# Patient Record
Sex: Female | Born: 1945
Health system: Southern US, Community
[De-identification: ages and names within clinical notes are randomized; demographics above are authoritative.]

## PROBLEM LIST (undated history)

## (undated) DIAGNOSIS — I10 Essential (primary) hypertension: Secondary | ICD-10-CM

## (undated) DIAGNOSIS — J449 Chronic obstructive pulmonary disease, unspecified: Secondary | ICD-10-CM

## (undated) DIAGNOSIS — I2510 Atherosclerotic heart disease of native coronary artery without angina pectoris: Secondary | ICD-10-CM

## (undated) DIAGNOSIS — N390 Urinary tract infection, site not specified: Secondary | ICD-10-CM

## (undated) DIAGNOSIS — F329 Major depressive disorder, single episode, unspecified: Secondary | ICD-10-CM

## (undated) DIAGNOSIS — E0590 Thyrotoxicosis, unspecified without thyrotoxic crisis or storm: Secondary | ICD-10-CM

## (undated) DIAGNOSIS — E669 Obesity, unspecified: Secondary | ICD-10-CM

## (undated) DIAGNOSIS — E785 Hyperlipidemia, unspecified: Secondary | ICD-10-CM

## (undated) DIAGNOSIS — R0902 Hypoxemia: Secondary | ICD-10-CM

## (undated) DIAGNOSIS — N059 Unspecified nephritic syndrome with unspecified morphologic changes: Secondary | ICD-10-CM

## (undated) DIAGNOSIS — J309 Allergic rhinitis, unspecified: Secondary | ICD-10-CM

## (undated) DIAGNOSIS — J4489 Other specified chronic obstructive pulmonary disease: Secondary | ICD-10-CM

## (undated) DIAGNOSIS — F3289 Other specified depressive episodes: Secondary | ICD-10-CM

## (undated) HISTORY — DX: Other specified depressive episodes: F32.89

## (undated) HISTORY — DX: Other specified chronic obstructive pulmonary disease: J44.89

## (undated) HISTORY — DX: Urinary tract infection, site not specified: N39.0

## (undated) HISTORY — DX: Obesity, unspecified: E66.9

## (undated) HISTORY — DX: Essential (primary) hypertension: I10

## (undated) HISTORY — DX: Chronic obstructive pulmonary disease, unspecified: J44.9

## (undated) HISTORY — DX: Thyrotoxicosis, unspecified without thyrotoxic crisis or storm: E05.90

## (undated) HISTORY — DX: Allergic rhinitis, unspecified: J30.9

## (undated) HISTORY — DX: Atherosclerotic heart disease of native coronary artery without angina pectoris: I25.10

## (undated) HISTORY — DX: Major depressive disorder, single episode, unspecified: F32.9

## (undated) HISTORY — DX: Hypoxemia: R09.02

## (undated) HISTORY — DX: Hyperlipidemia, unspecified: E78.5

## (undated) HISTORY — DX: Unspecified nephritic syndrome with unspecified morphologic changes: N05.9

## (undated) HISTORY — PX: CHOLECYSTECTOMY: SHX55

---

## 1999-04-08 ENCOUNTER — Encounter: Admission: RE | Admit: 1999-04-08 | Discharge: 1999-04-08 | Payer: Self-pay | Admitting: Family Medicine

## 1999-04-08 ENCOUNTER — Encounter: Payer: Self-pay | Admitting: Family Medicine

## 2000-02-01 ENCOUNTER — Encounter: Payer: Self-pay | Admitting: Family Medicine

## 2000-02-01 ENCOUNTER — Encounter: Admission: RE | Admit: 2000-02-01 | Discharge: 2000-02-01 | Payer: Self-pay | Admitting: Family Medicine

## 2002-05-30 ENCOUNTER — Encounter: Admission: RE | Admit: 2002-05-30 | Discharge: 2002-05-30 | Payer: Self-pay | Admitting: Family Medicine

## 2002-05-30 ENCOUNTER — Encounter: Payer: Self-pay | Admitting: Family Medicine

## 2002-09-25 ENCOUNTER — Other Ambulatory Visit: Admission: RE | Admit: 2002-09-25 | Discharge: 2002-09-25 | Payer: Self-pay | Admitting: Family Medicine

## 2003-12-23 ENCOUNTER — Other Ambulatory Visit: Admission: RE | Admit: 2003-12-23 | Discharge: 2003-12-23 | Payer: Self-pay | Admitting: Family Medicine

## 2004-10-06 ENCOUNTER — Encounter: Admission: RE | Admit: 2004-10-06 | Discharge: 2004-10-06 | Payer: Self-pay | Admitting: Family Medicine

## 2005-01-12 ENCOUNTER — Other Ambulatory Visit: Admission: RE | Admit: 2005-01-12 | Discharge: 2005-01-12 | Payer: Self-pay | Admitting: Family Medicine

## 2005-02-20 ENCOUNTER — Encounter: Admission: RE | Admit: 2005-02-20 | Discharge: 2005-02-20 | Payer: Self-pay | Admitting: Orthopedic Surgery

## 2005-02-23 ENCOUNTER — Ambulatory Visit (HOSPITAL_BASED_OUTPATIENT_CLINIC_OR_DEPARTMENT_OTHER): Admission: RE | Admit: 2005-02-23 | Discharge: 2005-02-23 | Payer: Self-pay | Admitting: Orthopedic Surgery

## 2005-02-23 ENCOUNTER — Encounter (INDEPENDENT_AMBULATORY_CARE_PROVIDER_SITE_OTHER): Payer: Self-pay | Admitting: Specialist

## 2005-04-29 ENCOUNTER — Inpatient Hospital Stay (HOSPITAL_COMMUNITY): Admission: EM | Admit: 2005-04-29 | Discharge: 2005-05-04 | Payer: Self-pay | Admitting: Emergency Medicine

## 2005-04-29 ENCOUNTER — Ambulatory Visit: Payer: Self-pay | Admitting: Emergency Medicine

## 2005-04-30 ENCOUNTER — Encounter (INDEPENDENT_AMBULATORY_CARE_PROVIDER_SITE_OTHER): Payer: Self-pay | Admitting: *Deleted

## 2005-05-01 ENCOUNTER — Encounter (INDEPENDENT_AMBULATORY_CARE_PROVIDER_SITE_OTHER): Payer: Self-pay | Admitting: Specialist

## 2005-06-06 ENCOUNTER — Ambulatory Visit: Payer: Self-pay | Admitting: Emergency Medicine

## 2005-06-28 ENCOUNTER — Ambulatory Visit: Payer: Self-pay | Admitting: Emergency Medicine

## 2005-07-04 ENCOUNTER — Ambulatory Visit: Payer: Self-pay | Admitting: Emergency Medicine

## 2005-11-02 ENCOUNTER — Encounter: Admission: RE | Admit: 2005-11-02 | Discharge: 2005-11-02 | Payer: Self-pay | Admitting: Family Medicine

## 2005-12-25 ENCOUNTER — Ambulatory Visit: Payer: Self-pay | Admitting: Emergency Medicine

## 2006-07-06 ENCOUNTER — Ambulatory Visit: Payer: Self-pay | Admitting: Emergency Medicine

## 2006-10-31 DIAGNOSIS — I10 Essential (primary) hypertension: Secondary | ICD-10-CM | POA: Insufficient documentation

## 2006-10-31 DIAGNOSIS — F3289 Other specified depressive episodes: Secondary | ICD-10-CM | POA: Insufficient documentation

## 2006-10-31 DIAGNOSIS — E669 Obesity, unspecified: Secondary | ICD-10-CM | POA: Insufficient documentation

## 2006-10-31 DIAGNOSIS — N059 Unspecified nephritic syndrome with unspecified morphologic changes: Secondary | ICD-10-CM | POA: Insufficient documentation

## 2006-10-31 DIAGNOSIS — J449 Chronic obstructive pulmonary disease, unspecified: Secondary | ICD-10-CM | POA: Insufficient documentation

## 2006-10-31 DIAGNOSIS — E785 Hyperlipidemia, unspecified: Secondary | ICD-10-CM | POA: Insufficient documentation

## 2006-10-31 DIAGNOSIS — F329 Major depressive disorder, single episode, unspecified: Secondary | ICD-10-CM | POA: Insufficient documentation

## 2006-10-31 DIAGNOSIS — J441 Chronic obstructive pulmonary disease with (acute) exacerbation: Secondary | ICD-10-CM | POA: Insufficient documentation

## 2006-10-31 DIAGNOSIS — J309 Allergic rhinitis, unspecified: Secondary | ICD-10-CM | POA: Insufficient documentation

## 2006-10-31 DIAGNOSIS — E0590 Thyrotoxicosis, unspecified without thyrotoxic crisis or storm: Secondary | ICD-10-CM | POA: Insufficient documentation

## 2006-11-08 ENCOUNTER — Ambulatory Visit: Payer: Self-pay | Admitting: Emergency Medicine

## 2007-01-21 ENCOUNTER — Ambulatory Visit: Payer: Self-pay | Admitting: Emergency Medicine

## 2007-01-24 ENCOUNTER — Encounter: Payer: Self-pay | Admitting: Emergency Medicine

## 2007-02-11 ENCOUNTER — Encounter: Admission: RE | Admit: 2007-02-11 | Discharge: 2007-02-11 | Payer: Self-pay | Admitting: Family Medicine

## 2007-02-12 ENCOUNTER — Ambulatory Visit: Payer: Self-pay | Admitting: Emergency Medicine

## 2007-02-12 ENCOUNTER — Telehealth (INDEPENDENT_AMBULATORY_CARE_PROVIDER_SITE_OTHER): Payer: Self-pay | Admitting: *Deleted

## 2007-02-12 DIAGNOSIS — J209 Acute bronchitis, unspecified: Secondary | ICD-10-CM | POA: Insufficient documentation

## 2007-04-29 ENCOUNTER — Ambulatory Visit: Payer: Self-pay | Admitting: Emergency Medicine

## 2007-06-28 ENCOUNTER — Encounter: Payer: Self-pay | Admitting: Emergency Medicine

## 2007-10-14 ENCOUNTER — Ambulatory Visit: Payer: Self-pay | Admitting: Emergency Medicine

## 2007-12-13 ENCOUNTER — Ambulatory Visit: Payer: Self-pay | Admitting: Emergency Medicine

## 2007-12-13 DIAGNOSIS — J069 Acute upper respiratory infection, unspecified: Secondary | ICD-10-CM | POA: Insufficient documentation

## 2008-03-11 ENCOUNTER — Encounter: Payer: Self-pay | Admitting: Emergency Medicine

## 2008-03-13 ENCOUNTER — Ambulatory Visit: Payer: Self-pay | Admitting: Emergency Medicine

## 2008-04-08 ENCOUNTER — Encounter: Admission: RE | Admit: 2008-04-08 | Discharge: 2008-04-08 | Payer: Self-pay | Admitting: Family Medicine

## 2008-04-23 ENCOUNTER — Encounter: Payer: Self-pay | Admitting: Emergency Medicine

## 2008-06-26 ENCOUNTER — Other Ambulatory Visit: Admission: RE | Admit: 2008-06-26 | Discharge: 2008-06-26 | Payer: Self-pay | Admitting: Family Medicine

## 2008-12-01 ENCOUNTER — Ambulatory Visit: Payer: Self-pay | Admitting: Emergency Medicine

## 2008-12-04 ENCOUNTER — Encounter: Payer: Self-pay | Admitting: Emergency Medicine

## 2009-04-08 ENCOUNTER — Encounter: Payer: Self-pay | Admitting: Emergency Medicine

## 2009-04-26 ENCOUNTER — Encounter: Admission: RE | Admit: 2009-04-26 | Discharge: 2009-04-26 | Payer: Self-pay | Admitting: Family Medicine

## 2009-06-17 ENCOUNTER — Ambulatory Visit: Payer: Self-pay | Admitting: Emergency Medicine

## 2009-11-23 ENCOUNTER — Telehealth (INDEPENDENT_AMBULATORY_CARE_PROVIDER_SITE_OTHER): Payer: Self-pay | Admitting: *Deleted

## 2009-11-23 ENCOUNTER — Ambulatory Visit: Payer: Self-pay | Admitting: Emergency Medicine

## 2010-01-05 ENCOUNTER — Ambulatory Visit: Payer: Self-pay | Admitting: Emergency Medicine

## 2010-02-10 NOTE — Miscellaneous (Signed)
Summary: Orders Update   Clinical Lists Changes  Orders: Added new Service order of Est. Patient Level IV (99214) - Signed 

## 2010-02-10 NOTE — Assessment & Plan Note (Signed)
Summary: cough, allergies, COPD   Visit Type:  Follow-up Primary Provider/Referring Provider:  Clyde Canterbury  CC:  COPD...increased SOB w/ exert...dry cough...RUQ pain with cough...feels "light-headed" w/ cough.  History of Present Illness: [name] [name]  is a 65 year old with moderate COPD.    ROV 12/01/08 -- No exacerbation since last visit. Has been maintained on Spiriva once daily. Has ventolin available to use as needed, not using it every day. Exertional tolerance is about the same. Uses O2 with some exertion but not all.  Wants to know if she can stop using O2 at night. Has had her flu shot.   Acute visit 06/17/09 -- Moderate COPD, presents with about a week of worsening PND, dry cough, chest tightness. Has been using ventolin more often. No sick contacts, no f/c/sweats. Some HA. Has heard wheezing. Not on fexofenadine right now.   ROV 11/23/09 -- COPD. Treated her for a possible flare of her allergies last time with Pred taper. Was better for a while. Now experiencing more dry cough for the last week, rarely productive, doesn't really notice any more nasal drainage. Has been on fexofanadine since last time.     Preventive Screening-Counseling & Management  Alcohol-Tobacco     Smoking Status: never  Current Medications (verified): 1)  Levothyroxine Sodium 100 Mcg Tabs (Levothyroxine Sodium) .... Take 1 Tablet By Mouth Once A Day 2)  Citalopram Hydrobromide 40 Mg Tabs (Citalopram Hydrobromide) .... Once Daily 3)  Lipitor 20 Mg  Tabs (Atorvastatin Calcium) .... Take 1 Tablet By Mouth Once A Day 4)  Atenolol 50 Mg  Tabs (Atenolol) .... Take 1 Tablet By Mouth Once A Day 5)  Hydrochlorothiazide 25 Mg  Tabs (Hydrochlorothiazide) .... Take 1 Tablet By Mouth Once A Day 6)  Spiriva Handihaler 18 Mcg  Caps (Tiotropium Bromide Monohydrate) .... Inhale Contents of 1 Capsule Once A Day 7)  Ventolin Hfa 108 (90 Base) Mcg/act Aers (Albuterol Sulfate) .... Inhale 2 Puffs Every 4 Hours As Needed 8)   Fexofenadine Hcl 60 Mg  Tabs (Fexofenadine Hcl) .... Take 1 Tablet By Mouth Two Times A Day As Needed 9)  Bayer Childrens Aspirin 81 Mg Chew (Aspirin) .... Once Daily 10)  Fish Oil 1000 Mg Caps (Omega-3 Fatty Acids) .... Take 1 Capsule By Mouth Once A Day 11)  Vitamin C 1000 Mg Tabs (Ascorbic Acid) .... Take 1 Tablet By Mouth Once A Day 12)  Caltrate 600+d 600-400 Mg-Unit Tabs (Calcium Carbonate-Vitamin D) .... Take 2 Tablet By Mouth Once A Day 13)  Oxygen .... 2l With Exertion  Allergies (verified): 1)  ! Sulfa  Vital Signs:  Patient profile:   65 year old female Height:      61 inches (154.94 cm) Weight:      170 pounds (77.27 kg) BMI:     32.24 O2 Sat:      93 % on Room air Temp:     98.1 degrees F (36.72 degrees C) oral Pulse rate:   101 / minute BP sitting:   122 / 72  (left arm) Cuff size:   regular  Vitals Entered By: Michel Bickers CMA (November 23, 2009 11:56 AM)  O2 Sat at Rest %:  93 O2 Flow:  Room air CC: COPD...increased SOB w/ exert...dry cough...RUQ pain with cough...feels "light-headed" w/ cough Comments Medications reviewed with patient Michel Bickers CMA  November 23, 2009 11:57 AM   Physical Exam  General:  normal appearance, healthy appearing, and obese.   Head:  normocephalic and atraumatic  Nose:  no deformity, discharge, inflammation, or lesions Mouth:  no deformity or lesions Neck:  no masses, thyromegaly, or abnormal cervical nodes, stridor on forced exp Chest Wall:  no deformities noted Lungs:  distant with good air movement and no wheezing or crackles Heart:  regular rate and rhythm, S1, S2 without murmurs, rubs, gallops, or clicks Abdomen:  not examined Msk:  no deformity or scoliosis noted with normal posture Extremities:  trace pretibial edema Neurologic:  non-focal Skin:  intact without lesions or rashes Psych:  alert and cooperative; normal mood and affect; normal attention span and concentration   Impression & Recommendations:  Problem # 1:   ALLERGIC RHINITIS (ICD-477.9) With cough, worsening.   Her updated medication list for this problem includes:    Fexofenadine Hcl 60 Mg Tabs (Fexofenadine hcl) .Marland Kitchen... Take 1 tablet by mouth two times a day as needed  Orders: Est. Patient Level IV (04540)  Problem # 2:  CHRONIC OBSTRUCTIVE PULMONARY DISEASE, MODERATE (ICD-496)  Medications Added to Medication List This Visit: 1)  Oxygen  .... 2l with exertion  Patient Instructions: 1)  We will temporarily stop your Spiriva for the next 2 weeks, then restart it.  2)  Start fluticasone spray, 2 sprays each side two times a day  3)  Continue your fexofenadine once daily  4)  If your cough continues please start doing nasal saline washes once daily  5)  Follow with Dr Delton Coombes in 1 month to assess your progress    Immunization History:  Influenza Immunization History:    Influenza:  historical (10/09/2009)

## 2010-02-10 NOTE — Procedures (Signed)
Summary: Colonoscopy Report/[facility]  Colonoscopy Report/[facility]   Imported By: Stephannie Li 08/01/2007 13:11:44  _____________________________________________________________________  External Attachment:    Type:   Image     Comment:   External Document

## 2010-02-10 NOTE — Assessment & Plan Note (Signed)
Summary: URI, COPD   Visit Type:  Follow-up PCP:  Clyde Canterbury  Chief Complaint:  Acute visit.  Pt c/o dry cough and runny nose x 2 days.  She also c/o pain in both sides she relates to coughing so much.  She states that her breathing is about the same but has noticed some wheezing.  Marland Kitchen  History of Present Illness: Ms. Slatten  is a 65 year old with moderate COPD.  Continues to take Spiriva ands is doing well. Has lost some wt since last visit 12 lbs.   Began to have clear PND 3/4, scratchy throat. No sore throat. Subjective fever. Positive sick contact at work. No GI symptoms. Has used SABA x 1, it did help. Starting to have dry cough.       Current Allergies: ! SULFA  Risk Factors:  Tobacco use:  never  Vital Signs:  Patient Profile:   65 Years Old Female Height:     61 inches Weight:      159.50 pounds O2 Sat:      96 % O2 treatment:    Room Air Temp:     97.8 degrees F oral Pulse rate:   78 / minute BP sitting:   122 / 68  (left arm)  Vitals Entered By: Vernie Murders (March 13, 2008 9:26 AM)                Physical Exam  General:     normal appearance, healthy appearing, and obese.   Head:     normocephalic and atraumatic Nose:     congested Mouth:     no deformity or lesions, some mild posterior pharyngeal erythema and drainage Neck:     no masses, thyromegaly, or abnormal cervical nodes Chest Wall:     no deformities noted Lungs:     distant with good air movement and no wheezing or crackles Heart:     regular rate and rhythm, S1, S2 without murmurs, rubs, gallops, or clicks Abdomen:     not examined Msk:     no deformity or scoliosis noted with normal posture Extremities:     trace pretibial edema Neurologic:     non-focal Skin:     intact without lesions or rashes Psych:     alert and cooperative; normal mood and affect; normal attention span and concentration   Impression & Recommendations:  Problem # 1:  VIRAL URI  (ICD-465.9) Symptomatic relief. Reviewed the signs and symptoms of COPD exac with her, instructed her to call if these evolve so we can treat her early. Reviewed good hand-washing technique.  Orders: Est. Patient Level III (81191)   Problem # 2:  CHRONIC OBSTRUCTIVE PULMONARY DISEASE, MODERATE (ICD-496) continue Spiriva as ordered f/u in 3 mo or as needed  The following medications were removed from the medication list:    Azithromycin 250 Mg Tabs (Azithromycin) .Marland Kitchen... Take two by mouth on the foirst day, then 1 by mouth once daily until gone.  Her updated medication list for this problem includes:    Spiriva Handihaler 18 Mcg Caps (Tiotropium bromide monohydrate) ..... Inhale contents of 1 capsule once a day    Ventolin Hfa 108 (90 Base) Mcg/act Aers (Albuterol sulfate) ..... Inhale 2 puffs every 4 hours as needed  Orders: Est. Patient Level III (47829)   Patient Instructions: 1)  Use delsym every 12 hours for cough. 2)  Try decongestants that contain either bromphenerimine or chlorphenerimine for your nasal congestion 3)  Call our  office if you develop colored sputum, fever, shortness of breath. We will treat you for an exacerbation of your COPD if these symptoms occur. 4)  Follow up with Dr Delton Coombes in 3 months or as needed

## 2010-02-10 NOTE — Letter (Signed)
Summary: CMN for Oxygen/[facility]  CMN for Oxygen/[facility]   Imported By: Sherian Rein 04/15/2009 13:07:48  _____________________________________________________________________  External Attachment:    Type:   Image     Comment:   External Document

## 2010-02-10 NOTE — Assessment & Plan Note (Signed)
Summary: COPD   Visit Type:  Follow-up PCP:  Clyde Canterbury  Chief Complaint:  fu visit........cough........SOB with exertion..........Marland Kitchenreviewed meds.  History of Present Illness: Ms. Rockford  is a 65 year old with moderate COPD.  She returns today for regularly scheduled follow-up visit.    She tells me that in general she been doing well.  Returns now feeling fairly well.  Using Spiriva daily. Still using Ventolin about 2 -3 times a week. Able to exert, getting around.  No wheeze currently, but does sometimes hear wheeze with exertion. Occas cough, non-prod.      Current Allergies: ! SULFA      Vital Signs:  Patient Profile:   65 Years Old Female Height:     61 inches Weight:      176 pounds BMI:     33.38 O2 Sat:      92 % O2 treatment:    Room Air Temp:     97.8 degrees F oral Pulse rate:   83 / minute BP sitting:   110 / 72  (left arm) Cuff size:   regular  Vitals Entered By: Clarise Cruz Duncan Dull) (October 14, 2007 4:47 PM)                 Physical Exam  General:     normal appearance, healthy appearing, and obese.   Head:     normocephalic and atraumatic Nose:     no deformity, discharge, inflammation, or lesions Mouth:     no deformity or lesions Neck:     no masses, thyromegaly, or abnormal cervical nodes Chest Wall:     no deformities noted Lungs:     distant with good air movement and no wheezing, she does have some mild basilar inspiratory crackles. positive wheeze on forced expiration. Heart:     regular rate and rhythm, S1, S2 without murmurs, rubs, gallops, or clicks Abdomen:     not examined Msk:     no deformity or scoliosis noted with normal posture Extremities:     trace pretibial edema Psych:     alert and cooperative; normal mood and affect; normal attention span and concentration    Pulmonary Function Test Height (in.): 61 Gender: Female    Impression & Recommendations:  Problem # 1:  CHRONIC OBSTRUCTIVE PULMONARY  DISEASE, MODERATE (ICD-496) Stable - same regimen seasonal flu shot f/u 6 mo  Medications Added to Medication List This Visit: 1)  Levothyroxine Sodium 175 Mcg Tabs (Levothyroxine sodium) .... Every other day once a day 2)  Citalopram Hydrobromide 40 Mg Tabs (Citalopram hydrobromide) .... Once daily 3)  Actonel 35 Mg Tabs (Risedronate sodium) .... Take 1 tablet once a week 4)  Synthroid 150 Mcg Tabs (Levothyroxine sodium) .... Every other day once a day 5)  Bayer Childrens Aspirin 81 Mg Chew (Aspirin) .... Once daily   Patient Instructions: 1)  Continue Spiriva once daily  2)  ventolin as needed 3)  Flu shot tomorrow 4)  Follow up with Dr Delton Coombes in 6 months or as needed.    ]

## 2010-02-10 NOTE — Progress Notes (Signed)
Summary: rx needed > fluticasone to [facility] [employer] Note Call from Patient Call back at Work Phone [phone]   Caller: Patient Call For: byrum Summary of Call: needs an rx for nasonex [facility] to [facility] [place] [phone] Initial call taken by: Lacinda Axon,  November 23, 2009 2:13 PM  Follow-up for Phone Call        nasonex not on med list- just seen this [id number]  1 Vernie Murders  November 23, 2009 2:55 PM  pt returned call.  she states that she was given a sample of nasonex this morning at the [facility] but the rx was not sent.  per pt instructions, pt is to start fluticasone.  rx sent to her verified pharmacy. Boone Master CNA/MA  November 23, 2009 3:47 PM     New/Updated Medications: FLUTICASONE PROPIONATE 50 MCG/ACT SUSP (FLUTICASONE PROPIONATE) 2 sprays each nostril two times a day Prescriptions: FLUTICASONE PROPIONATE 50 MCG/ACT SUSP (FLUTICASONE PROPIONATE) 2 sprays each nostril two times a day  #1 x 6   Entered by:   Boone Master CNA/MA   Authorized by:   Leslye Peer MD   Signed by:   Boone Master CNA/MA on 11/23/2009   Method used:   Electronically to        [facility]  [address] (retail)       [address]       [place], [place]  [address]       Ph: [phone]       Fax: [phone]   RxID:   [id number]

## 2010-02-10 NOTE — Assessment & Plan Note (Signed)
Summary: COPD  Medications Added SYNTHROID 150 MCG  TABS (LEVOTHYROXINE SODIUM) Take 1 tablet by mouth once a day FEXOFENADINE HCL 60 MG  TABS (FEXOFENADINE HCL) Take 1 tablet by mouth two times a day as needed * VITAMIN D Take 1 tablet once a month      Allergies Added: NKDA  Chief Complaint:  COPD.  History of Present Illness: [name] is a 65 year old with moderate COPD.  She returns today for regular scheduled follow-up visit.  She tells me that in general she been doing well although she's noticed a worsening of her wheezing in the cold weather.  It's also been more difficult for her to exert herself this winter.  She still able to do her usual activities.  She is not had any exacerbations since her last visit in October 2008. She is using Proventil on average one time a day.  Her flu shot is up to date.  She has been wearing her oxygen reliably with exertion.  She is not having significant cough.    Current Allergies: No known allergies      Review of Systems      See HPI   Vital Signs:  Patient Profile:   65 Years Old Female Weight:      173.38 pounds O2 Sat:      95 % O2 treatment:    Room Air Temp:     98.0 degrees F oral Pulse rate:   68 / minute BP sitting:   124 / 82  (left arm)  Vitals Entered By: Cloyde Reams RN (January 21, 2007 4:56 PM)             Comments Pt here for follow-up visit.  Increased SOB assoc with cold weather. Coughing some-non-productive.  C/O headache comes and goes. Medications reviewed      Physical Exam  General:     normal appearance, healthy appearing, and obese.   Head:     normocephalic and atraumatic Nose:     no deformity, discharge, inflammation, or lesions Mouth:     no deformity or lesions Neck:     no masses, thyromegaly, or abnormal cervical nodes Chest Wall:     no deformities noted Lungs:     distant with good air movement and no wheezing, she does have some mild basilar inspiratory  crackles Heart:     regular rate and rhythm, S1, S2 without murmurs, rubs, gallops, or clicks Abdomen:     obese soft benign Msk:     no deformity or scoliosis noted with normal posture Extremities:     trace pretibial edema Psych:     alert and cooperative; normal mood and affect; normal attention span and concentration     Impression & Recommendations:  Problem # 1:  CHRONIC OBSTRUCTIVE PULMONARY DISEASE, MODERATE (ICD-496) She will continue her Spiriva once daily and Proventil on an as needed basis.  If her Proventil use increases it may be necessary to start her on a long-acting beta agonist.  She will continue her oxygen as ordered. She will follow up with me in 3 to 4 months or sooner if she has any difficulty.  Problem # 2:  ALLERGIC RHINITIS (ICD-477.9) she will continue her same regimen Her updated medication list for this problem includes:    Fexofenadine Hcl 60 Mg Tabs (Fexofenadine hcl) .Marland Kitchen... Take 1 tablet by mouth two times a day as needed   Medications Added to Medication List This Visit: 1)  Synthroid  150 Mcg Tabs (Levothyroxine sodium) .... Take 1 tablet by mouth once a day 2)  Fexofenadine Hcl 60 Mg Tabs (Fexofenadine hcl) .... Take 1 tablet by mouth two times a day as needed 3)  Vitamin D 50mu  .... Take 1 tablet once a month   Patient Instructions: 1)  Continue spiriva once daily. 2)  Continue Proventil if needed for shortness of breath. 3)  Continue oxygen with exertion. 4)  Follow-up 3 - 4 months with Dr Delton Coombes.    ]

## 2010-02-10 NOTE — Progress Notes (Signed)
Summary: Phone: cough, need anitbiotics   Phone Note Call from Patient Call back at Work Phone [phone]   Caller: Patient Call For: nurse/doctor Summary of Call: requesting antibiotics for cough, also cough syrup.  [facility] [place] [phone], work [phone] Initial call taken by: Tivis Ringer February 12, 2007 10:53  Follow-up for Phone Call        work number is wrong - NA at home - no longer listed @ work number.  [name] [name] February 12, 2007 11:30  Additional Follow-up for Phone Call Additional follow up Details #1::        Called pt at work, pt stated that she has had a dry cough for 2 days.  +wheezing and chest congestion, +head congestion with a headache.  pt denies fever, chills and sweats.  informed pt will check with physician and call her back.  ..................................................................Marland KitchenBoone Master CNA  February 12, 2007 2:11 PM     Additional Follow-up for Phone Call Additional follow up Details #2::    LMOVM TCB .................................................................Marland KitchenMarland KitchenVernie [name]  February 12, 2007 2:00 PM   pt made appt for today with tammy parret at 4:30 [name] Diver LPN,  February 12, 2007 2:08 PM

## 2010-02-10 NOTE — Assessment & Plan Note (Signed)
Summary: allergies, COPD   Visit Type:  Follow-up Primary Provider/Referring Provider:  Clyde Canterbury  CC:  Acute Visit.  c/o increased SOB at rest and with activity, wheezing, and dry "hacky" cough x 1wk.  Denies chest tightness.  Marland Kitchen  History of Present Illness: Ms. Coughlin  is a 65 year old with moderate COPD.    ROV 12/01/08 -- No exacerbation since last visit. Has been maintained on Spiriva once daily. Has ventolin available to use as needed, not using it every day. Exertional tolerance is about the same. Uses O2 with some exertion but not all.  Wants to know if she can stop using O2 at night. Has had her flu shot.   Acute visit 06/17/09 -- Moderate COPD, presents with about a week of worsening PND, dry cough, chest tightness. Has been using ventolin more often. No sick contacts, no f/c/sweats. Some HA. Has heard wheezing. Not on fexofenadine right now.     Current Medications (verified): 1)  Levothyroxine Sodium 100 Mcg Tabs (Levothyroxine Sodium) .... Take 1 Tablet By Mouth Once A Day 2)  Citalopram Hydrobromide 40 Mg Tabs (Citalopram Hydrobromide) .... Once Daily 3)  Lipitor 20 Mg  Tabs (Atorvastatin Calcium) .... Take 1 Tablet By Mouth Once A Day 4)  Atenolol 50 Mg  Tabs (Atenolol) .... Take 1 Tablet By Mouth Once A Day 5)  Hydrochlorothiazide 25 Mg  Tabs (Hydrochlorothiazide) .... Take 1 Tablet By Mouth Once A Day 6)  Spiriva Handihaler 18 Mcg  Caps (Tiotropium Bromide Monohydrate) .... Inhale Contents of 1 Capsule Once A Day 7)  Ventolin Hfa 108 (90 Base) Mcg/act Aers (Albuterol Sulfate) .... Inhale 2 Puffs Every 4 Hours As Needed 8)  Fexofenadine Hcl 60 Mg  Tabs (Fexofenadine Hcl) .... Take 1 Tablet By Mouth Two Times A Day As Needed 9)  Bayer Childrens Aspirin 81 Mg Chew (Aspirin) .... Once Daily 10)  Fish Oil 1000 Mg Caps (Omega-3 Fatty Acids) .... Take 1 Capsule By Mouth Once A Day 11)  Vitamin C 1000 Mg Tabs (Ascorbic Acid) .... Take 1 Tablet By Mouth Once A Day 12)  Caltrate  600+d 600-400 Mg-Unit Tabs (Calcium Carbonate-Vitamin D) .... Take 2 Tablet By Mouth Once A Day  Allergies (verified): 1)  ! Sulfa  Vital Signs:  Patient profile:   65 year old female Height:      61 inches Weight:      171 pounds BMI:     32.43 O2 Sat:      96 % on Room air Temp:     97.9 degrees F oral Pulse rate:   88 / minute BP sitting:   110 / 70  (left arm) Cuff size:   regular  Vitals Entered By: Gweneth Dimitri RN (June 17, 2009 10:13 AM)  O2 Flow:  Room air CC: Acute Visit.  c/o increased SOB at rest and with activity, wheezing, dry "hacky" cough x 1wk.  Denies chest tightness.   Comments Medications reviewed with patient Daytime contact number verified with patient. Gweneth Dimitri RN  June 17, 2009 10:14 AM    Physical Exam  General:  normal appearance, healthy appearing, and obese.   Head:  normocephalic and atraumatic Nose:  no deformity, discharge, inflammation, or lesions Mouth:  no deformity or lesions Neck:  no masses, thyromegaly, or abnormal cervical nodes, mild stridor on forced exp Chest Wall:  no deformities noted Lungs:  distant with good air movement and no wheezing or crackles Heart:  regular rate and rhythm,  S1, S2 without murmurs, rubs, gallops, or clicks Abdomen:  not examined Msk:  no deformity or scoliosis noted with normal posture Extremities:  trace pretibial edema Neurologic:  non-focal Skin:  intact without lesions or rashes Psych:  alert and cooperative; normal mood and affect; normal attention span and concentration   Impression & Recommendations:  Problem # 1:  ALLERGIC RHINITIS (ICD-477.9)  Acute flare with UA irritation and stridor.  - restart fexofenadine - brief pred taper - if no better then start nasal steroid, nasal saline washes  Orders: Est. Patient Level IV (04540)  Problem # 2:  CHRONIC OBSTRUCTIVE PULMONARY DISEASE, MODERATE (ICD-496) Does not appear to have acute exacerbation on exam or by hx.  - continue  spiriva - no abx at this time  Medications Added to Medication List This Visit: 1)  Levothyroxine Sodium 100 Mcg Tabs (Levothyroxine sodium) .... Take 1 tablet by mouth once a day 2)  Fish Oil 1000 Mg Caps (Omega-3 fatty acids) .... Take 1 capsule by mouth once a day 3)  Vitamin C 1000 Mg Tabs (Ascorbic acid) .... Take 1 tablet by mouth once a day 4)  Caltrate 600+d 600-400 Mg-unit Tabs (Calcium carbonate-vitamin d) .... Take 2 tablet by mouth once a day 5)  Prednisone 10 Mg Tabs (Prednisone) .... 3 by mouth once daily x 2 days, then 2 by mouth once daily x 2 days then 1po once daily x 2 days then stop  Patient Instructions: 1)  Take prednisone taper as directed 2)  Restart fexofenadine once daily  3)  Call our office next week if you are not feeling better 4)  Otherwise follow up with Dr Delton Coombes at your usual 6 month appointment. Prescriptions: PREDNISONE 10 MG TABS (PREDNISONE) 3 by mouth once daily x 2 days, then 2 by mouth once daily x 2 days then 1po once daily x 2 days then stop  #12 x 0   Entered and Authorized by:   Leslye Peer MD   Signed by:   Leslye Peer MD on 06/17/2009   Method used:   Electronically to        [facility]  [address] (retail)       [address]       [place], [place]  [address]       Ph: [phone]       Fax: [phone]   RxID:   [id number]    Immunization History:  Influenza Immunization History:    Influenza:  historical (12/09/2008)

## 2010-02-10 NOTE — Letter (Signed)
Summary: CMN for Oxygen/[facility]  CMN for Oxygen/[facility]   Imported By: Lanelle Bal 03/17/2008 08:44:51  _____________________________________________________________________  External Attachment:    Type:   Image     Comment:   External Document

## 2010-02-10 NOTE — Assessment & Plan Note (Signed)
Summary: URI, COPD   Visit Type:  Follow-up PCP:  Clyde Canterbury  Chief Complaint:  Cough, congestion, and dyspnea.  History of Present Illness: [name]  is a 65 year old with moderate COPD.  Beginning yesterday she began to experience scratchy throat, dry mouth, watery PND and cough turning yellowish. No fever, chills.   Using Spiriva daily, but hasn't taken for a few days because she ran out. Uses Ventolin few times a week. Some increased wheeze with this illness.      Prior Medications Reviewed Using: List Brought by Patient  Updated Prior Medication List: LEVOTHYROXINE SODIUM 175 MCG TABS (LEVOTHYROXINE SODIUM) every other day once a day CITALOPRAM HYDROBROMIDE 40 MG TABS (CITALOPRAM HYDROBROMIDE) once daily LIPITOR 20 MG  TABS (ATORVASTATIN CALCIUM) Take 1 tablet by mouth once a day ATENOLOL 50 MG  TABS (ATENOLOL) Take 1 tablet by mouth once a day HYDROCHLOROTHIAZIDE 25 MG  TABS (HYDROCHLOROTHIAZIDE) Take 1 tablet by mouth once a day SPIRIVA HANDIHALER 18 MCG  CAPS (TIOTROPIUM BROMIDE MONOHYDRATE) Inhale contents of 1 capsule once a day FOSAMAX 70 MG TABS (ALENDRONATE SODIUM) Once weekly VENTOLIN HFA 108 (90 BASE) MCG/ACT AERS (ALBUTEROL SULFATE) Inhale 2 puffs every 4 hours as needed FEXOFENADINE HCL 60 MG  TABS (FEXOFENADINE HCL) Take 1 tablet by mouth two times a day as needed * VITAMIN D Take 1 tablet once a month SYNTHROID 150 MCG TABS (LEVOTHYROXINE SODIUM) every other day once a day [employer] ASPIRIN 81 MG CHEW (ASPIRIN) once daily  Current Allergies (reviewed today): ! SULFA      Vital Signs:  Patient Profile:   65 Years Old Female Height:     61 inches Weight:      168 pounds O2 Sat:      95 % O2 treatment:    Room Air Temp:     98.6 degrees F oral Pulse rate:   76 / minute BP sitting:   112 / 68  (left arm) Cuff size:   regular  Vitals Entered By: Cloyde Reams RN (December 13, 2007 8:57 AM)             Comments Pt is here today for an  acute OV for congestion, dyspnea, cough.  Onset this am.  Scratchy throat yesterday.  Productive cough-yellow phlegm. Medications reviewed Cloyde Reams RN  December 13, 2007 9:12 AM      Physical Exam  General:     normal appearance, healthy appearing, and obese.   Head:     normocephalic and atraumatic Nose:     no deformity, discharge, inflammation, or lesions Mouth:     no deformity or lesions Neck:     no masses, thyromegaly, or abnormal cervical nodes Chest Wall:     no deformities noted Lungs:     distant with good air movement and no wheezing, she does have some mild basilar inspiratory crackles. positive wheeze on forced expiration. Heart:     regular rate and rhythm, S1, S2 without murmurs, rubs, gallops, or clicks Abdomen:     not examined Msk:     no deformity or scoliosis noted with normal posture Extremities:     trace pretibial edema Psych:     alert and cooperative; normal mood and affect; normal attention span and concentration      Impression & Recommendations:  Problem # 1:  VIRAL URI (ICD-465.9) symptomatic relief  script given for azithro in case she develops symptoms of bronchitis.   Problem # 2:  CHRONIC OBSTRUCTIVE PULMONARY DISEASE, MODERATE (ICD-496) spiriva Her updated medication list for this problem includes:    Spiriva Handihaler 18 Mcg Caps (Tiotropium bromide monohydrate) ..... Inhale contents of 1 capsule once a day    Ventolin Hfa 108 (90 Base) Mcg/act Aers (Albuterol sulfate) ..... Inhale 2 puffs every 4 hours as needed    Azithromycin 250 Mg Tabs (Azithromycin) .Marland Kitchen... Take two by mouth on the foirst day, then 1 by mouth once daily until gone.  Orders: Est. Patient Level III (16109)   Medications Added to Medication List This Visit: 1)  Fosamax 70 Mg Tabs (Alendronate sodium) .... Once weekly 2)  Ventolin Hfa 108 (90 Base) Mcg/act Aers (Albuterol sulfate) .... Inhale 2 puffs every 4 hours as needed 3)  Azithromycin 250 Mg Tabs  (Azithromycin) .... Take two by mouth on the foirst day, then 1 by mouth once daily until gone.   Patient Instructions: 1)  Continue your Spiriva once daily  2)  Use over-the-counter medications for symptomatica relief. Try medications that contain chlorphenerimine or bromphenerimine and use as directed. 3)  Fill your prescription for azithromycin if you develop fever, increasing sputum or a change in color of your sputum.  4)  Wear a mask when visiting your husband in the hospital until your upper respiratory infection resolves. 5)  Follow up with Dr Delton Coombes as previously scheduled.   Prescriptions: AZITHROMYCIN 250 MG TABS (AZITHROMYCIN) take two by mouth on the foirst day, then 1 by mouth once daily until gone.  #6 x 0   Entered and Authorized by:   Leslye Peer MD   Signed by:   Leslye Peer MD on 12/13/2007   Method used:   Print then Give to Patient   RxID:   [id number]  ]

## 2010-02-10 NOTE — Miscellaneous (Signed)
Summary: Letter of Medical Necessity for oxygen/[facility]  Letter of Medical Necessity for oxygen/[facility]   Imported By: Sherian Rein 05/01/2008 13:11:19  _____________________________________________________________________  External Attachment:    Type:   Image     Comment:   External Document

## 2010-02-10 NOTE — Assessment & Plan Note (Signed)
Summary: COPD, allergies   Visit Type:  Follow-up PCP:  Clyde Canterbury  Chief Complaint:  COPD.  History of Present Illness: [name] is a 65 year old with moderate COPD.  She returns today for regular scheduled follow-up visit.  She tells me that in general she been doing well. Had an exacerbation in 2/09, better after Pred and doxy. Returns now feeling fairly well.  Using Spiriva daily. Uses Ventolin about 2 -3 times a week. Able to exert, getting around.  No wheeze currently. Occas cough, non-prod.      Current Allergies: ! SULFA     Review of Systems      See HPI   Vital Signs:  Patient Profile:   65 Years Old Female Weight:      177.13 pounds O2 Sat:      93 % O2 treatment:    Room Air Temp:     98.4 degrees F oral Pulse rate:   79 / minute BP sitting:   110 / 72  (right arm)  Vitals Entered By: Cloyde Reams RN (April 29, 2007 4:17 PM)             Comments Pt is here today for a follow-up visit.  Pt states breathing OK.  Has occassional episodes of SOB since the long periods of rain.  Medications reviewed ..................................................................Marland KitchenCloyde Reams RN  April 29, 2007 4:27 PM      Physical Exam  General:     normal appearance, healthy appearing, and obese.   Head:     normocephalic and atraumatic Nose:     no deformity, discharge, inflammation, or lesions Mouth:     no deformity or lesions Neck:     no masses, thyromegaly, or abnormal cervical nodes Chest Wall:     no deformities noted Lungs:     distant with good air movement and no wheezing, she does have some mild basilar inspiratory crackles. positive wheeze on forced expiration. Heart:     regular rate and rhythm, S1, S2 without murmurs, rubs, gallops, or clicks Abdomen:     obese soft benign Msk:     no deformity or scoliosis noted with normal posture Extremities:     trace pretibial edema Psych:     alert and cooperative; normal mood and affect; normal  attention span and concentration     Impression & Recommendations:  Problem # 1:  CHRONIC OBSTRUCTIVE PULMONARY DISEASE, MODERATE (ICD-496) Continue Spiriva daily, as needed Ventolin.  RTC in 6 mo or as needed.  The following medications were removed from the medication list:    Doxycycline Hyclate 100 Mg Caps (Doxycycline hyclate) .Marland Kitchen... 1 by mouth two times a day    Prednisone 10 Mg Tabs (Prednisone) .Marland KitchenMarland KitchenMarland KitchenMarland Kitchen 4 tabs for 2 days, then 3 tabs for 2 days, 2 tabs for 2 days, then 1 tab for 2 days, then stop  Her updated medication list for this problem includes:    Spiriva Handihaler 18 Mcg Caps (Tiotropium bromide monohydrate) ..... Inhale contents of 1 capsule once a day    Proventil Hfa 108 (90 Base) Mcg/act Aers (Albuterol sulfate) ..... Inhale 2 puffs every 4 hrs as needed  Orders: Est. Patient Level IV (16109)   Problem # 2:  ALLERGIC RHINITIS (ICD-477.9) She will start using her fexofenadine on an everyday basis.  Her updated medication list for this problem includes:    Fexofenadine Hcl 60 Mg Tabs (Fexofenadine hcl) .Marland Kitchen... Take 1 tablet by mouth two times a day as needed  Patient Instructions: 1)  Start taking fexofenadine twice daily on a schedule during allergy season. 2)  Continue Spiriva as ordered. 3)  Continue ventilin as needed. 4)  Return to see Dr Delton Coombes in 6 months or as needed.     ]

## 2010-02-10 NOTE — Assessment & Plan Note (Signed)
Summary: sick visit/mg  Medications Added DOXYCYCLINE HYCLATE 100 MG  CAPS (DOXYCYCLINE HYCLATE) 1 by mouth two times a day PREDNISONE 10 MG  TABS (PREDNISONE) 4 tabs for 2 days, then 3 tabs for 2 days, 2 tabs for 2 days, then 1 tab for 2 days, then stop      Allergies Added: ! SULFA  Visit Type:  acute PCP:  [facility]  Chief Complaint:  coughing with yellow mucus.  History of Present Illness: [name] is a 65 year old with moderate COPD   She returns today for an acute office visit for 3 days of productive cough with thick yellow sputum and wheezing.  Denies chest pain, dyspnea, orthopnea, hemoptysis, fever, n/v/d, edema.    Current Allergies (reviewed today): ! SULFA Updated/Current Medications (including changes made in today's visit):  SYNTHROID 150 MCG  TABS (LEVOTHYROXINE SODIUM) Take 1 tablet by mouth once a day LEXAPRO 20 MG  TABS (ESCITALOPRAM OXALATE) Take 1 tablet by mouth once a day LIPITOR 20 MG  TABS (ATORVASTATIN CALCIUM) Take 1 tablet by mouth once a day ATENOLOL 50 MG  TABS (ATENOLOL) Take 1 tablet by mouth once a day HYDROCHLOROTHIAZIDE 25 MG  TABS (HYDROCHLOROTHIAZIDE) Take 1 tablet by mouth once a day SPIRIVA HANDIHALER 18 MCG  CAPS (TIOTROPIUM BROMIDE MONOHYDRATE) Inhale contents of 1 capsule once a day ACTONEL 35 MG  TABS (RISEDRONATE SODIUM) Take 1 tablet once a week PROVENTIL HFA 108 (90 BASE) MCG/ACT  AERS (ALBUTEROL SULFATE) Inhale 2 puffs every 4 hrs as needed FEXOFENADINE HCL 60 MG  TABS (FEXOFENADINE HCL) Take 1 tablet by mouth two times a day as needed * VITAMIN D Take 1 tablet once a month DOXYCYCLINE HYCLATE 100 MG  CAPS (DOXYCYCLINE HYCLATE) 1 by mouth two times a day PREDNISONE 10 MG  TABS (PREDNISONE) 4 tabs for 2 days, then 3 tabs for 2 days, 2 tabs for 2 days, then 1 tab for 2 days, then stop   Past Medical History:    DEPRESSION (ICD-311)    HYPERTHYROIDISM (ICD-242.90)    HYPERTENSION (ICD-401.9)    HYPERLIPIDEMIA (ICD-272.4)  GLOMERULONEPHRITIS (ICD-583.9)    ALLERGIC RHINITIS (ICD-477.9)    OBESITY (ICD-278.00)    CHRONIC OBSTRUCTIVE PULMONARY DISEASE, MODERATE (ICD-496)             Review of Systems      See HPI   Vital Signs:  Patient Profile:   65 Years Old Female Weight:      174.38 pounds O2 Sat:      91 % O2 treatment:    Room Air Temp:     97.0 degrees F oral Pulse rate:   86 / minute BP sitting:   126 / 78  (right arm) Cuff size:   regular  Vitals Entered By: Darra Lis RMA (February 12, 2007 4:23 PM)             Is Patient Diabetic? No Comments Medications reviewed with patient ..................................................................Marland KitchenDarra Lis RMA  February 12, 2007 4:28 PM      Physical Exam  No acute distress with stable vital signs. HEENT:  Unremarkable.  Oropharynx is clear. LUNG FIELDS:  Coarse breath sounds with faint expiratory wheezes.  HEART:  There is a regular rhythm without murmur, gallop, rub. ABDOMEN:  Soft, benign. EXTREMITIES:  Warm without calf tenderness, cyanosis, clubbing, edema.        Impression & Recommendations:  Problem # 1:  BRONCHITIS, ACUTE (ICD-466.0) Doxycycline 100mg  two times a day for  7 days. Prednisone taper over next week. Xopenex Neb given in office.  Mucinex DM two times a day as needed for cough. follow up as needed  Please contact office for sooner follow up if symptoms do not improve or worsen.  Her updated medication list for this problem includes:    Spiriva Handihaler 18 Mcg Caps (Tiotropium bromide monohydrate) ..... Inhale contents of 1 capsule once a day    Proventil Hfa 108 (90 Base) Mcg/act Aers (Albuterol sulfate) ..... Inhale 2 puffs every 4 hrs as needed    Doxycycline Hyclate 100 Mg Caps (Doxycycline hyclate) .Marland Kitchen... 1 by mouth two times a day  Orders: Est. Patient Level IV (16109)   Medications Added to Medication List This Visit: 1)  Doxycycline Hyclate 100 Mg Caps (Doxycycline hyclate) .Marland Kitchen.. 1  by mouth two times a day 2)  Prednisone 10 Mg Tabs (Prednisone) .... 4 tabs for 2 days, then 3 tabs for 2 days, 2 tabs for 2 days, then 1 tab for 2 days, then stop   Patient Instructions: 1)  Doxycycline 100mg  two times a day for 7 days 2)  Prednisone taper over next week 3)  Mucinex DM two times a day as needed for cough 4)  follow up as needed  5)  Please contact office for sooner follow up if symptoms do not improve or worsen     Prescriptions: PREDNISONE 10 MG  TABS (PREDNISONE) 4 tabs for 2 days, then 3 tabs for 2 days, 2 tabs for 2 days, then 1 tab for 2 days, then stop  #20 x 0   Entered and Authorized by:   Rubye Oaks NP   Signed by:   Tammy Parrett NP on 02/12/2007   Method used:   Electronically sent to ...       [facility]  [address]       [address], [place]  [address]       Ph: [phone]       Fax: [phone]   RxID:   [id number] DOXYCYCLINE HYCLATE 100 MG  CAPS (DOXYCYCLINE HYCLATE) 1 by mouth two times a day  #14 x 0   Entered and Authorized by:   Rubye Oaks NP   Signed by:   Tammy Parrett NP on 02/12/2007   Method used:   Electronically sent to ...       [facility]  [address]       [address]       [place], [place]  [address]       Ph: [phone]       Fax: [phone]   RxID:   [id number]  ]

## 2010-02-10 NOTE — Assessment & Plan Note (Signed)
Summary: COPD   Visit Type:  Follow-up Primary Provider/Referring Provider:  Clyde Canterbury  CC:  COPD follow-up.  Pt states there have been no changes in her breathing.Marland Kitchen  History of Present Illness: Ms. Bourassa  is a 65 year old with moderate COPD.    ROV 12/01/08 -- No exacerbation since last visit. Has been maintained on Spiriva once daily. Has ventolin available to use as needed, not using it every day. Exertional tolerance is about the same. Uses O2 with some exertion but not all.  Wants to know if she can stop using O2 at night. Has had her flu shot.     Allergies (verified): 1)  ! Sulfa  Vital Signs:  Patient profile:   65 year old female Height:      61 inches (154.94 cm) Weight:      160.50 pounds (72.95 kg) BMI:     30.44 O2 Sat:      93 % on Room air Temp:     97.4 degrees F (36.33 degrees C) oral Pulse rate:   101 / minute BP sitting:   114 / 72  (left arm) Cuff size:   regular  Vitals Entered By: Michel Bickers CMA (December 01, 2008 4:24 PM)  O2 Flow:  Room air  Physical Exam  General:  normal appearance, healthy appearing, and obese.   Head:  normocephalic and atraumatic Nose:  no deformity, discharge, inflammation, or lesions Mouth:  no deformity or lesions Neck:  no masses, thyromegaly, or abnormal cervical nodes Chest Wall:  no deformities noted Lungs:  distant with good air movement and no wheezing or crackles Heart:  regular rate and rhythm, S1, S2 without murmurs, rubs, gallops, or clicks Abdomen:  not examined Msk:  no deformity or scoliosis noted with normal posture Extremities:  trace pretibial edema Neurologic:  non-focal Skin:  intact without lesions or rashes Psych:  alert and cooperative; normal mood and affect; normal attention span and concentration   Impression & Recommendations:  Problem # 1:  CHRONIC OBSTRUCTIVE PULMONARY DISEASE, MODERATE (ICD-496) Stable.  - same BD's - O2 with exertion - will se if Christoper Allegra will pick up her  concentrator since she isn't using it.   Medications Added to Medication List This Visit: 1)  Levothyroxine Sodium 150 Mcg Tabs (Levothyroxine sodium) .Marland Kitchen.. 1 by mouth daily  Other Orders: Est. Patient Level III (11914) DME Referral (DME)  Patient Instructions: 1)  Continue your Spiriva and Ventolin as you are using them.  2)  Continue your oxygen with exertion. We will see if Christoper Allegra will pick up your concentrator since you aren't requiring it. 3)  Follow up with Dr Delton Coombes in 6 months or as needed

## 2010-02-10 NOTE — Assessment & Plan Note (Signed)
Summary: allergies, COPD   Visit Type:  Follow-up Primary Provider/Referring Provider:  Clyde Canterbury  CC:  COPD...AR.Marland KitchenMarland Kitchenpt states nasal drainage and cough are better...back on Spiriva daily...SOB is better.  History of Present Illness: [name] [name]  is a 65 year old with moderate COPD.    ROV 12/01/08 -- No exacerbation since last visit. Has been maintained on Spiriva once daily. Has ventolin available to use as needed, not using it every day. Exertional tolerance is about the same. Uses O2 with some exertion but not all.  Wants to know if she can stop using O2 at night. Has had her flu shot.   Acute visit 06/17/09 -- Moderate COPD, presents with about a week of worsening PND, dry cough, chest tightness. Has been using ventolin more often. No sick contacts, no f/c/sweats. Some HA. Has heard wheezing. Not on fexofenadine right now.   ROV 11/23/09 -- COPD. Treated her for a possible flare of her allergies last time with Pred taper. Was better for a while. Now experiencing more dry cough for the last week, rarely productive, doesn't really notice any more nasal drainage. Has been on fexofanadine since last time.   ROV 01/05/10 -- moderate COPD, allergic rhinitis. Last time we held Spiriva x 2 weeks, added fluticasone to fexofenadine. Discussed NSWs, she did daily for a while, now using as needed. Her cough and nasal gtt seem to both be better. She did miss the Spiriva when off of it, breathing back to baseline.     Current Medications (verified): 1)  Levothyroxine Sodium 100 Mcg Tabs (Levothyroxine Sodium) .... Take 1 Tablet By Mouth Once A Day 2)  Citalopram Hydrobromide 40 Mg Tabs (Citalopram Hydrobromide) .... Once Daily 3)  Lipitor 20 Mg  Tabs (Atorvastatin Calcium) .... Take 1 Tablet By Mouth Once A Day 4)  Atenolol 50 Mg  Tabs (Atenolol) .... Take 1 Tablet By Mouth Once A Day 5)  Hydrochlorothiazide 25 Mg  Tabs (Hydrochlorothiazide) .... Take 1 Tablet By Mouth Once A Day 6)  Spiriva Handihaler  18 Mcg  Caps (Tiotropium Bromide Monohydrate) .... Inhale Contents of 1 Capsule Once A Day 7)  Ventolin Hfa 108 (90 Base) Mcg/act Aers (Albuterol Sulfate) .... Inhale 2 Puffs Every 4 Hours As Needed 8)  Fexofenadine Hcl 60 Mg  Tabs (Fexofenadine Hcl) .... Take 1 Tablet By Mouth Two Times A Day As Needed 9)  Bayer Childrens Aspirin 81 Mg Chew (Aspirin) .... Once Daily 10)  Fish Oil 1000 Mg Caps (Omega-3 Fatty Acids) .... Take 1 Capsule By Mouth Once A Day 11)  Vitamin C 1000 Mg Tabs (Ascorbic Acid) .... Take 1 Tablet By Mouth Once A Day 12)  Caltrate 600+d 600-400 Mg-Unit Tabs (Calcium Carbonate-Vitamin D) .... Take 2 Tablet By Mouth Once A Day 13)  Oxygen .... 2l With Exertion 14)  Fluticasone Propionate 50 Mcg/act Susp (Fluticasone Propionate) .... 2 Sprays Each Nostril Two Times A Day  Allergies (verified): 1)  ! Sulfa  Vital Signs:  Patient profile:   65 year old female Height:      61 inches (154.94 cm) Weight:      167 pounds (75.91 kg) BMI:     31.67 O2 Sat:      93 % on Room air Temp:     97.7 degrees F (36.50 degrees C) oral Pulse rate:   93 / minute BP sitting:   132 / 88  (right arm) Cuff size:   regular  Vitals Entered By: Michel Bickers CMA (January 05, 2010  1:42 PM)  O2 Sat at Rest %:  93 O2 Flow:  Room air CC: COPD...AR.Marland KitchenMarland Kitchenpt states nasal drainage and cough are better...back on Spiriva daily...SOB is better Comments Medications reviewed with patient Michel Bickers [facility]  January 05, 2010 1:43 PM   Physical Exam  General:  normal appearance, healthy appearing, and obese.   Head:  normocephalic and atraumatic Nose:  no deformity, discharge, inflammation, or lesions Mouth:  no deformity or lesions Neck:  no masses, thyromegaly, or abnormal cervical nodes, stridor on forced exp Chest Wall:  no deformities noted Lungs:  distant with good air movement and no wheezing or crackles Heart:  regular rate and rhythm, S1, S2 without murmurs, rubs, gallops, or clicks Abdomen:   not examined Msk:  no deformity or scoliosis noted with normal posture Extremities:  trace pretibial edema Neurologic:  non-focal Skin:  intact without lesions or rashes Psych:  alert and cooperative; normal mood and affect; normal attention span and concentration   Impression & Recommendations:  Problem # 1:  ALLERGIC RHINITIS (ICD-477.9)  Better.  - try decreasing fluticasone to once daily, teh possibly to off depending on symptoms; may need to go back on in the Spring - NSWs as needed   Her updated medication list for this problem includes:    Fexofenadine Hcl 60 Mg Tabs (Fexofenadine hcl) .Marland Kitchen... Take 1 tablet by mouth two times a day as needed    Fluticasone Propionate 50 Mcg/act Susp (Fluticasone propionate) .Marland Kitchen... 2 sprays each nostril two times a day  Orders: Est. Patient Level IV (04540)  Problem # 2:  CHRONIC OBSTRUCTIVE PULMONARY DISEASE, MODERATE (ICD-496) Continue same BD's  Patient Instructions: 1)  Please continue your same inhaled medications.  2)  Continue your fexofenadine once daily  3)  Decrease your fluticasone spray to once daily  4)  Use your nasal saline washes as needed  5)  Return to see Dr Delton Coombes in 4 months or as needed

## 2010-02-10 NOTE — Letter (Signed)
Summary: CMN for Oxygen/[facility]  CMN for Oxygen/[facility]   Imported By: Sherian Rein 12/08/2008 14:37:34  _____________________________________________________________________  External Attachment:    Type:   Image     Comment:   External Document

## 2010-02-21 ENCOUNTER — Telehealth (INDEPENDENT_AMBULATORY_CARE_PROVIDER_SITE_OTHER): Payer: Self-pay | Admitting: *Deleted

## 2010-03-02 NOTE — Progress Notes (Signed)
Summary: Flonase sent to [facility]<<<done  Phone Note Call from Patient   Caller: Patient Call For: byrum Summary of Call: Patient phoned stated that Dr Delton Coombes gave her a sample inhailer at her last visit Fluticasone Propiomate 50 mg stated that he told her to call when she needed a prescription  and we would send the prescription to [facility]. Patient is almost out and would the prescription. Patient can be reached at [phone] Initial call taken by: Vedia Coffer,  February 21, 2010 10:35 AM  Follow-up for Phone Call        Left detailed msg that we would go ahead and send RX to [facility] for Fluticasone for 3 month supply. She can callback if she has any questions. Otherwise this has been taken care of.Michel Bickers [facility]  February 21, 2010 3:03 PM    Prescriptions: FLUTICASONE PROPIONATE 50 MCG/ACT SUSP (FLUTICASONE PROPIONATE) 2 sprays each nostril two times a day  #3 x 3   Entered by:   Michel Bickers CMA   Authorized by:   Leslye Peer MD   Signed by:   Michel Bickers CMA on 02/21/2010   Method used:   Faxed to ...       [facility] (mail-order)             , [place]         Ph: [phone]       Fax: [phone]   RxID:   [id number]

## 2010-04-08 ENCOUNTER — Ambulatory Visit (INDEPENDENT_AMBULATORY_CARE_PROVIDER_SITE_OTHER): Payer: BC Managed Care – PPO | Admitting: Pulmonary Disease

## 2010-04-08 ENCOUNTER — Encounter: Payer: Self-pay | Admitting: Pulmonary Disease

## 2010-04-08 ENCOUNTER — Ambulatory Visit (INDEPENDENT_AMBULATORY_CARE_PROVIDER_SITE_OTHER)
Admission: RE | Admit: 2010-04-08 | Discharge: 2010-04-08 | Disposition: A | Discharge status: Home / Self Care | Payer: BC Managed Care – PPO | Source: Ambulatory Visit | Attending: Pulmonary Disease | Admitting: Pulmonary Disease

## 2010-04-08 ENCOUNTER — Telehealth: Payer: Self-pay | Admitting: Emergency Medicine

## 2010-04-08 VITALS — BP 106/72 | HR 93 | Temp 98.9°F | Ht 61.0 in | Wt 166.4 lb

## 2010-04-08 DIAGNOSIS — J449 Chronic obstructive pulmonary disease, unspecified: Secondary | ICD-10-CM

## 2010-04-08 DIAGNOSIS — J441 Chronic obstructive pulmonary disease with (acute) exacerbation: Secondary | ICD-10-CM

## 2010-04-08 DIAGNOSIS — J4489 Other specified chronic obstructive pulmonary disease: Secondary | ICD-10-CM

## 2010-04-08 MED ORDER — PREDNISONE 10 MG PO TABS: ORAL_TABLET | ORAL | Status: AC

## 2010-04-08 MED ORDER — AZITHROMYCIN 500 MG PO TABS: 500.0000 mg | ORAL_TABLET | Freq: Every day | ORAL | Status: AC

## 2010-04-08 NOTE — Telephone Encounter (Signed)
Spoke with pt and she c/o increased sob since yesterday, cough w/ yellow phlem that kept her up all night, wheezing and chest tightness, pt states she feels clammy. She is coming in to see RA to at 11:45  Carver Fila, CMA

## 2010-04-08 NOTE — Progress Notes (Signed)
  Subjective:    Patient ID: [name] [name], female    DOB: Sep 09, 1945, 65 y.o.   MRN: [id number]  HPI Ms. Parrott is a 65 year old with moderate COPD.   ROV 12/01/08 -- No exacerbation since last visit. Has been maintained on Spiriva once daily. Has ventolin available to use as needed, not using it every day. Exertional tolerance is about the same. Uses O2 with some exertion but not all. Wants to know if she can stop using O2 at night. Has had her flu shot.   Acute visit 06/17/09 -- Moderate COPD, presents with about a week of worsening PND, dry cough, chest tightness. Has been using ventolin more often. No sick contacts, no f/c/sweats. Some HA. Has heard wheezing. Not on fexofenadine right now.   ROV 11/23/09 -- COPD. Treated her for a possible flare of her allergies last time with Pred taper. Was better for a while. Now experiencing more dry cough for the last week, rarely productive, doesn't really notice any more nasal drainage. Has been on fexofanadine since last time.   ROV 01/05/10 -- moderate COPD, allergic rhinitis. Last time we held Spiriva x 2 weeks, added fluticasone to fexofenadine. Discussed NSWs, she did daily for a while, now using as needed. Her cough and nasal gtt seem to both be better. She did miss the Spiriva when off of it, breathing back to baseline.   04/08/2010 Sick visit >>Increased DOE, heavy chest, green phlegm - sudden onset x 1 day, no preceding URI, no aggravavting or relieving factor, no change in environment on questioning. Is using oxygen x 1 day continuous Uses O2 during evenings only CXR - no infiltrate    Review of SystemsPt denies any significant  nasal congestion or excess secretions, fever, chills, sweats, unintended wt loss, pleuritic or exertional cp, orthopnea pnd or leg swelling.  Pt also denies any obvious fluctuation in symptoms with weather or environmental change or other alleviating or aggravating factors.    Pt denies any increase in rescue  therapy over baseline, denies waking up needing it or having early am exacerbations or coughing/wheezing/ or dyspnea       Objective:   Physical Exam Gen. Pleasant, well-nourished, in mild distress, normal affect ENT - no lesions, no post nasal drip, on [place] Neck: No JVD, no thyromegaly, no carotid bruits Lungs: no use of accessory muscles, no dullness to percussion,no rales, faint rhonchi  Cardiovascular: Rhythm regular, heart sounds  normal, no murmurs or gallops, no peripheral edema Abdomen: soft and non-tender, no hepatosplenomegaly, BS normal. Musculoskeletal: No deformities, no cyanosis or clubbing Neuro:  alert, non focal         Assessment & Plan:

## 2010-04-08 NOTE — Patient Instructions (Signed)
Antibiotic & prednisone sent to pharmacy Chest xray today Delsym cough syrup 2 tsp 3 times daily as needed Call if worse DO not take celexa x 5 days while on antibiotic (due to interaction)

## 2010-04-12 ENCOUNTER — Telehealth: Payer: Self-pay | Admitting: Emergency Medicine

## 2010-04-12 MED ORDER — HYDROCODONE-HOMATROPINE 5-1.5 MG/5ML PO SYRP: ORAL_SOLUTION | ORAL | Status: DC

## 2010-04-12 NOTE — Assessment & Plan Note (Addendum)
Unclear cause of flare - no preceding URI but will treat as infective exacerbation CXR -no infiltrate - reviewed Z-pak x 5 ds -DO not take celexa x 5 days while on antibiotic (due to qT prolongation) Delsym cough syrup 2 tsp 3 times daily as needed Pred taper every 4 days, start at 40 mg She will call if worse She will stay on O2 until flare resolved

## 2010-04-12 NOTE — Telephone Encounter (Signed)
Per SN, hydromet 1 tsp every 6 hours prn cough.  #4ox no refills.

## 2010-04-12 NOTE — Telephone Encounter (Signed)
Called, spoke with pt.  She is aware of SN's recs and aware rx called into [facility].  She verbalized understanding of this and will call back if she does not get better.

## 2010-04-12 NOTE — Telephone Encounter (Signed)
Spoke with pt and she states her cough is not getting any better. She is having a dry cough w/ occas yellow to green phlem. Pt states she is not resting at all due to the cough. Pt saw Dr. Vassie Loll on Friday. Pt finished her zpak today and she is currently on a pred taper and is at 3 tablets a day x 3 days. Pt tried taking delsym but it did not help at all. Pt would like to know if she can have a cough syrup called in for her. Will send to Dr. Kriste Basque since Dr. Delton Coombes is not in the office. Please advise Dr. Kriste Basque. Thanks  Allergies  Allergen Reactions  . Sulfonamide Derivatives     Carver Fila, CMA

## 2010-04-12 NOTE — Telephone Encounter (Signed)
ATC NA unable to leave VM WCB 

## 2010-05-06 ENCOUNTER — Encounter: Payer: Self-pay | Admitting: Emergency Medicine

## 2010-05-10 ENCOUNTER — Encounter: Payer: Self-pay | Admitting: Emergency Medicine

## 2010-05-10 ENCOUNTER — Ambulatory Visit (INDEPENDENT_AMBULATORY_CARE_PROVIDER_SITE_OTHER): Payer: BC Managed Care – PPO | Admitting: Emergency Medicine

## 2010-05-10 DIAGNOSIS — J309 Allergic rhinitis, unspecified: Secondary | ICD-10-CM

## 2010-05-10 NOTE — Patient Instructions (Signed)
Stay off Spiriva until our next visit Restart nasal saline washes every day Increase allegra to twice a day Continue fluticasone nasal spray.  We will follow up in 3 -4 weeks  

## 2010-05-10 NOTE — Assessment & Plan Note (Signed)
Stay off Spiriva until our next visit Restart nasal saline washes every day Increase allegra to twice a day Continue fluticasone nasal spray.  We will follow up in 3 -4 weeks

## 2010-05-10 NOTE — Progress Notes (Signed)
  Subjective:    Patient ID: [name] [name], female    DOB: 1945/02/01, 65 y.o.   MRN: [id number]  HPI [name] [name] is a 65 year old with moderate COPD.  ROV 12/01/08 -- No exacerbation since last visit. Has been maintained on Spiriva once daily. Has ventolin available to use as needed, not using it every day. Exertional tolerance is about the same. Uses O2 with some exertion but not all. Wants to know if she can stop using O2 at night. Has had her flu shot.   Acute visit 06/17/09 -- Moderate COPD, presents with about a week of worsening PND, dry cough, chest tightness. Has been using ventolin more often. No sick contacts, no f/c/sweats. Some HA. Has heard wheezing. Not on fexofenadine right now.   ROV 11/23/09 -- COPD. Treated her for a possible flare of her allergies last time with Pred taper. Was better for a while. Now experiencing more dry cough for the last week, rarely productive, doesn't really notice any more nasal drainage. Has been on fexofanadine since last time.   ROV 01/05/10 -- moderate COPD, allergic rhinitis. Last time we held Spiriva x 2 weeks, added fluticasone to fexofenadine. Discussed NSWs, she did daily for a while, now using as needed. Her cough and nasal gtt seem to both be better. She did miss the Spiriva when off of it, breathing back to baseline.    04/08/2010  Sick visit >>Increased DOE, heavy chest, green phlegm - sudden onset x 1 day, no preceding URI, no aggravavting or relieving factor, no change in environment on questioning. Is using oxygen x 1 day continuous  Uses O2 during evenings only  CXR - no infiltrate  ROV 05/10/10 -- COPD, allergic rhinitis. Seen for possible AE end of March, treated for pred + abx, improved. Last couple days more mucous and cough, yellowish phlegm. No sick contacts. No real change in allergy sx - takes allegra and nasal spray reliably. Takes spiriva, stopped it this week to see if it was irritating her throat.     Review of Systems As  per HPI    Objective:   Physical Exam Gen: Pleasant, well-nourished, in no distress,  normal affect  ENT: No lesions,  mouth clear,  Mild stridor.   Neck: No JVD, no TMG, no carotid bruits  Lungs: No use of accessory muscles, no dullness to percussion, clear without rales or rhonchi  Cardiovascular: RRR, heart sounds normal, no murmur or gallops, no peripheral edema  Musculoskeletal: No deformities, no cyanosis or clubbing  Neuro: alert, non focal  Skin: Warm, no lesions or rashes        Assessment & Plan:

## 2010-05-23 ENCOUNTER — Other Ambulatory Visit: Payer: Self-pay | Admitting: Family Medicine

## 2010-05-23 DIAGNOSIS — Z1231 Encounter for screening mammogram for malignant neoplasm of breast: Secondary | ICD-10-CM

## 2010-05-24 NOTE — Assessment & Plan Note (Signed)
[name] [name]                             PULMONARY OFFICE NOTE   NAME:Knoll, [name] [name]                   MRN:          [id number]  DATE:11/08/2006                            DOB:          July 28, 1945    SUBJECTIVE:  [name] [name] is a 64 year old woman with moderate to severe  chronic obstructive pulmonary disease. She has been fairly stable since  our last visit which was in June 2008. She uses Spiriva and Proventil on  an as needed basis. She tells me that beginning last weekend, she began  to experience some mild viral-type symptoms with some headache, sore  throat, and subjective fevers. She has also begun to develop a dry  hacking cough that has been, for the most part, non-productive. She  occasionally has some nasal drainage, but this has not been problematic  during this illness. She is not significantly congested. She did see a  little bit of blood from her left nostril earlier in the week. She  denies any sick contacts. She has not been wheezing. She is not having  any significant shortness of breath. The cough is her most bothersome  symptom.   CURRENT MEDICATIONS:  1. Lipitor 20 mg daily.  2. Lexapro 20 mg daily.  3. Atenolol 50 mg daily.  4. Hydrochlorothiazide 25 mg daily.  5. Aspirin 81 mg daily.  6. Spiriva 18 mcg 1 inhalation daily.  7. Synthroid 0.11 mg daily.  8. Actonel 35 mg weekly  9. Proventil 2 puffs every 4 hours p.r.n. for shortness of breath.  10.Allegra 180 mg p.r.n.   PHYSICAL EXAMINATION:  GENERAL:  This is a pleasant obese woman who is  in no distress.  VITAL SIGNS:  Weight 176 pounds up 5 pounds from our last visit in June.  Temperature 98.0, blood pressure 104/70, heart rate 85, SPO2 95% on room  air.  HEENT:  There is no oropharyngeal erythema. She has no exudate.  NECK:  Supple.  LUNGS:  She has some mild expiratory noise. There is no overt strider.  Clear with distant breath sounds. She does not wheeze.  There are no  crackles.  HEART:  Regular without a murmur.  ABDOMEN:  Benign.  EXTREMITIES:  Were not examined.   IMPRESSION:  1. Upper respiratory infection, probably viral. I have recommended      symptomatic relief with over-the-counter decongestants and we will      give her guaifenesin plus codeine to use at night as a cough      suppressant.  2. Moderate chronic obstructive pulmonary disease. I will continue her      on Spiriva and albuterol as needed. Because she is at risk for      having an exacerbation of her chronic obstructive pulmonary disease      in the setting of an upper respiratory infection, I gave her      prescriptions of both azithromycin and a prednisone taper to fill      if she begins to develop symptoms consistent with an exacerbation.      We reviewed those symptoms today. I asked her  to call me if she has      to start those medications.  3. [name] [name] and I have follow up scheduled for December. We will      follow up at that time or sooner if she has any difficulty in the      interim.     Leslye Peer, MD  Electronically Signed    RSB/MedQ  DD: 11/08/2006  DT: 11/09/2006  Job #: [id number]   cc:   Otilio Connors. Gerri Spore, M.D.  Garnetta Buddy, M.D.

## 2010-05-24 NOTE — Assessment & Plan Note (Signed)
[facility]                             PULMONARY OFFICE NOTE   NAME:Bukowski, STEPHANIEMARIE STOFFEL                   MRN:          [id number]  DATE:07/06/2006                            DOB:          1945-06-08    SUBJECTIVE:  [name] [name] is a 65 year old woman with a history of  moderate to severe chronic obstructive pulmonary disease and exertional  hypoxemia. She tells me that she has been doing fairly well, but she has  noticed more wheezing and noise while she breathes over the past 2-3  weeks. This seems to happen more when she is exerting herself. She also  has some occasional associated shortness of breath. She rarely coughs,  and when she does cough this is non-productive. She is currently being  maintained on Spiriva and albuterol.   MEDICATIONS:  1. Lipitor 20 mg daily.  2. Lexapro 20 mg daily.  3. Atenolol 50 mg daily.  4. Hydrochlorothiazide 25 mg daily.  5. Spiriva once daily.  6. Synthroid 0.11 mg daily.  7. Actonel 35 mg per week.  8. Proventil 2 puffs every 4 hours p.r.n. for shortness of breath.  9. Allegra p.r.n.   PHYSICAL EXAMINATION:  GENERAL:  This is a pleasant obese woman who is  in no distress on room air.  VITAL SIGNS:  Weight 171 pounds which is up 7 pounds from her last  visit. Temperature 98.4, blood pressure 112/72, heart rate 80, SPO2 93%  on room air.  HEENT:  There is no oropharyngeal erythema.  NECK:  Without lymphadenopathy or strider.  LUNGS:  Clear to auscultation bilaterally. She has distant breath  sounds, but there is no wheezing.  HEART:  Regular without murmur.  ABDOMEN:  Benign.  EXTREMITIES:  Trace pre-tibial edema.  NEUROLOGIC:  Non-focal exam.   IMPRESSION:  1. Moderate to severe chronic obstructive pulmonary disease that      appears to be stable.  2. Obesity.   PLAN:  1. Continue Spiriva 1 inhalation daily at this time and albuterol      p.r.n.. We may need to consider adding a long acting beta  agonist      and/or an inhaled corticosteroid if she continues to have      functional limitation or if her frequency of albuterol use      increases.  2. I will follow up with Ms. Marich in 6 months or sooner should she      have any difficulty in the interim.     Leslye Peer, MD  Electronically Signed    RSB/MedQ  DD: 07/08/2006  DT: 07/09/2006  Job #: [id number]   cc:   Otilio Connors. Gerri Spore, M.D.  Garnetta Buddy, M.D.

## 2010-05-26 ENCOUNTER — Ambulatory Visit
Admission: RE | Admit: 2010-05-26 | Discharge: 2010-05-26 | Disposition: A | Discharge status: Home / Self Care | Payer: BC Managed Care – PPO | Source: Ambulatory Visit | Attending: Family Medicine | Admitting: Family Medicine

## 2010-05-26 DIAGNOSIS — Z1231 Encounter for screening mammogram for malignant neoplasm of breast: Secondary | ICD-10-CM

## 2010-05-27 NOTE — Consult Note (Signed)
NAME:  [name] [name], [name] [name] NO.:  [id number]   MEDICAL RECORD NO.:  [id number]            PATIENT TYPE:   LOCATION:                                 FACILITY:   PHYSICIAN:  Garnetta Buddy, M.D.        DATE OF BIRTH:   DATE OF CONSULTATION:  DATE OF DISCHARGE:                                   CONSULTATION   This is a 65 year old white female with a one week history of fatigue,  weakness, dyspnea, nonproductive cough, myalgias, lower extremity swelling  and palpable skin lesions. She presented to her primary care physician and  underwent blood draw which revealed a creatinine of 10. She was sent  immediately to [facility] where she was found to have red blood  cell casts. She was short of breath and had a nonproductive cough.   PAST MEDICAL HISTORY:  1.  Hypothyroidism.  2.  Depression.  3.  Gastroesophageal reflux disease.  4.  History of cholecystectomy 1980s.  5.  History of dental extraction x2 last month.  6.  History of carpal tunnel surgery 1 month ago on February 15.  7.  History of tobacco abuse.  8.  History of hyperlipidemia.   HOME MEDICATIONS:  1.  Atenolol 50 mg daily.  2.  Hydrochlorothiazide 25 mg daily.  3.  Lexapro 20 mg daily.  4.  Lipitor 20 mg daily.  5.  Synthroid 0.125 mg daily.  6.  Combivent 1 puff q.6 h p.r.n.   HOSPITAL MEDICINES:  1.  Combivent inhaler q.6 h.  2.  Atenolol 50 mg daily.  3.  Lexapro 20 mg a day.  4.  Lasix 80 mg b.i.d.  5.  Hydrochlorothiazide 25 mg daily.  6.  Synthroid 0.125 mg daily.  7.  Protonix 40 mg daily.  8.  Zocor 40 mg daily.   ALLERGIES:  No known drug allergies.   SOCIAL HISTORY:  Married, no children. Quit tobacco 9 years ago, no alcohol.   FAMILY HISTORY:  Father died in his 17s of ruptured appendix, mother still  living at 55, she has 3 brothers who are healthy.   REVIEW OF SYSTEMS:  GENERAL:  Positive for arthralgias, myalgias, aches,  fevers, sweats or chills. EYES:  No visual  loss, no eye pain.  EARS/NOSE/MOUTH/THROAT:  No hearing loss, positive for sore throat, positive  for sinus congestion. CARDIOVASCULAR:  No chest pain, no palpitations, no  syncope, no cardiac disease, no history of cardiac catheterization.  RESPIRATORY SYSTEM:  Dyspnea on exertion, minimal. She is unable to walk 100  yards without becoming short of breath. She is positive for wheezing,  positive for dry, nonproductive cough, denies hemoptysis, positive for lower  extremity swelling. ABDOMEN:  Denies, nausea, no vomiting, anorexia but has  no diarrhea or constipation. UROGENITAL:  Positive for dysuria, no gross  hematuria, no filminess in urine, no history of renal calc, no diagnosis of  renal disease in the past although we do not have old records. DERMATOLOGIC:  Palpable purpura, positive rash lower extremities. MUSCULOSKELETAL:  Positive for arthralgias, myalgias, no long-term nonsteroidal  antiinflammatory use. NEUROLOGIC:  No CVA, no seizures, no paresthesias.  HEMATOLOGIC:  No pulmonary embolus, no cancer.   PHYSICAL EXAMINATION:  GENERAL:  Alert, very pleasant, not confused.  VITAL SIGNS:  Blood pressure 130/80, pulse 75, temperature 98.1.  HEENT:  Head normocephalic and atraumatic, pupils round, equal and reactive.  Mild subconjunctival pallor. Extraocular movements are normal. Ears, nose,  mouth and throat are normal.  Nasal mucosa clear. Tympanic membrane normal.  Tongue was coated.  NECK:  Supple, no JVD, no carotid bruits.  CARDIOVASCULAR:  Regular rate and rhythm with diastolic murmur. No rubs, no  gallops.  RESPIRATORY:  Bilateral crackles with increased respiratory rate, wheezes  bilaterally.  ABDOMEN:  Soft, nontender, nondistended, bowel sounds present.  EXTREMITIES:  Palpable purpura with 1+ edema.  NEUROLOGIC:  Cranial nerves are intact, sensation was normal, motor intact.   LABORATORY DATA:  Sodium 138, potassium 4.6, chloride 106, CO2 20, BUN 102,  creatinine of  9.2. Glucose 108, calcium 8.5, WBC 12.8, hemoglobin 9.7,  platelets 406, urinalysis red blood cells, __________.   ASSESSMENT/PLAN:  Probable acute renal failure with renal syndrome. Will  check chest x-ray.  Start empiric steroids.  Will probably need renal biopsy  on Monday. Will also schedule a 2-D echo in order to rule out endocarditis.      Garnetta Buddy, M.D.  Electronically Signed     MWW/MEDQ  D:  04/29/2005  T:  05/01/2005  Job:  [id number]   cc:   Hollice Espy, M.D.

## 2010-05-27 NOTE — Op Note (Signed)
NAME:  [name] [name], [name] [name]            ACCOUNT NO.:  [id number]   MEDICAL RECORD NO.:  [id number]          PATIENT TYPE:  AMB   LOCATION:  [facility]                         FACILITY:  [facility]   PHYSICIAN:  Marlowe Kays, M.D.  DATE OF BIRTH:  28-Sep-1945   DATE OF PROCEDURE:  02/23/2005  DATE OF DISCHARGE:                                 OPERATIVE REPORT   PREOPERATIVE DIAGNOSES:  1.  Right carpal tunnel syndrome.  2.  Chronic painful tenosynovitis dorsum right wrist and hand/   POSTOPERATIVE DIAGNOSES:  1.  Right carpal tunnel syndrome.  2.  Chronic painful tenosynovitis dorsum right wrist and hand.   OPERATIONS:  1.  Right carpal tunnel release.  2.  Tenosynovectomy dorsum hand and wrist right upper extremity   SURGEON:  Marlowe Kays, M.D.   ASSISTANT:  Nurse.   ANESTHESIA:  General.   __________   INDICATIONS FOR PROCEDURE:  She has had two unrelated problems, with severe  carpal tunnel type symptoms, with nerve conduction studies documenting a  severe carpal tunnel syndrome. In addition, she has had chronic pain and  swelling on the dorsum of her right hand and wrist. She does a lot of  computer work. X-rays have been normal.   PROCEDURE:  Satisfactory general anesthesia, pneumatic tourniquet on right  upper extremity. Esmarched out nonsterilely, and tourniquet inflated to 300  mmHg. Right upper extremity was prepped with DuraPrep from mid-forearm to  fingertips and draped in a sterile field. I first performed the carpal  tunnel surgery, making the usual incision,  marking out along the base of  the thenar eminence, crossing obliquely over the flexor crease of the wrist  and the distal forearm.  A diminutive palmaris longus tendon was identified  and retracted ulnarward. Beneath it, the median nerve was identified.  It  did have some compression proximal to the wrist protecting it. The overlying  skin, subcutaneous tissue, and fascia in the distal palm were released from  about the nerve. She did have several portions of muscle belly compressing  the nerve as well, and I tried to minimize muscle injury, resecting the  muscle as far ulnarward as I could. No potential bleeders were noted, with  bipolar cautery being present if needed.  I then irrigated the wound well  with sterile saline.  The skin and subcutaneous tissue only were closed as a  unit with interrupted 4-0 nylon mattress sutures.  I then pronated the  forearm and marked out a lazy S incision starting from distal and ulnar  going proximal and more radial, encompassing the hand and wrist and distal  forearm. Cutaneous nerves and major vessels were preserved, working my way  down to the extensor retinaculum at the wrist and the extensor tendons. She  had a good bit of synovial fluid present and fairly extensive synovium which  in some cases was adherent to but did not appear to be definitely eating  into the extensor tendons. The synovium did not appear to have a rheumatoid  type appearance. The extensor tendons were individually synovectomized, and  all the combined specimen was sent to pathology for  analysis. Synovectomy  was carried back proximal to the transverse retinaculum the wrist.  When I  felt like we had removed all the synovium available to [place], the wound was  irrigated with sterile saline, and the skin and subcutaneous tissue were  closed with interrupted 4-0 nylon mattress sutures. Betadine, Adaptic, and  dry sterile dressing were applied to both wounds, followed by boxing glove  type dressing with volar and dorsal splints. Tourniquet was released. She  tolerated the procedure well and was taken to recovery room in satisfactory  condition, with no known complications.  02/23/2005           ______________________________  Marlowe Kays, M.D.     JA/MEDQ  D:  02/23/2005  T:  02/24/2005  Job:  [id number]

## 2010-05-27 NOTE — Discharge Summary (Signed)
[name] [name], KUTSCHER            ACCOUNT NO.:  [id number]   MEDICAL RECORD NO.:  [id number]          PATIENT TYPE:  INP   LOCATION:  5501                         FACILITY:  [facility]   PHYSICIAN:  Jackie Plum, M.D.DATE OF BIRTH:  September 13, 1945   DATE OF ADMISSION:  04/28/2005  DATE OF DISCHARGE:  05/04/2005                                 DISCHARGE SUMMARY   DISCHARGE DIAGNOSIS:  1.  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  2.  Chronic obstructive pulmonary disease.  3.  Hypertension.  4.  Dyslipidemia.  5.  Hypothyroidism.  6.  Depressive disorder.  7.  Gastroesophageal reflux disease.  8.  History of tobacco use.  9.  Obesity.  10. Iron-deficiency anemia.   DISCHARGE MEDICATIONS:  The patient was asked to continue all of her  preadmission medications except for amoxicillin.  She was started on Lasix  40 daily and K-Dur 20 mEq daily.  Started on Prednisone taper.   She is to followup with Dr. Hyman Hopes in 10-14 days and Dr. Delton Coombes in 3-4 weeks.   DISCHARGE CONDITION:  Improved and satisfactory.   REASON FOR ADMISSION:  The patient is a 65 year old lady who presented with  one week history of fatigue, weakness, dyspnea, nonproductive cough,  myalgias, lower extremity swelling and palpable skin lesions.  She was noted  to have elevated creatinine and sent to [facility] for further evaluation  and management.  Please see full details regarding the patient's  presentation via H&P by Dr. Jannette Spanner.  On admission the patient was  initiated on renal workup with 24-hour urine collection and renal  ultrasound, amongst others.  She was seen in consultation by Dr. Elvis Coil  of nephrology who was concerned about possible pulmonary renal process in  view of respiratory problems.  There was great concern for possible  vasculitis.  She was started on steroids and had a workup with 2-D  echocardiogram that was done, which ruled out endocarditis.  The patient was  diuresed with Lasix and had IV fluids.  She was also seen in consultation by  the pulmonary team and eventually had a biopsy done, which showed  postinfectious glomerulonephritis.  She was continued on steroids and  followup CT scan showed that there was no pulmonary involvement.  Before  this patient had been treated with __________ , a well as diuresis.  She  improved significantly and was stable to be discharged home on this  admission on room air without any problems.      Jackie Plum, M.D.  Electronically Signed    GO/MEDQ  D:  07/20/2005  T:  07/20/2005  Job:  [id number]

## 2010-05-27 NOTE — H&P (Signed)
[name] [name], SCHWOERER            ACCOUNT NO.:  [id number]   MEDICAL RECORD NO.:  [id number]          PATIENT TYPE:  INP   LOCATION:  1828                         FACILITY:  [facility]   PHYSICIAN:  Michelene Gardener, MD    DATE OF BIRTH:  1945-12-02   DATE OF ADMISSION:  04/28/2005  DATE OF DISCHARGE:                                HISTORY & PHYSICAL   PRIMARY CARE PHYSICIAN:  Carola J. Gerri Spore, M.D.   CHIEF COMPLAINT:  Bilateral lower extremity swelling for the past 1 week.   HISTORY OF PRESENT ILLNESS:  This is a 65 year old Caucasian female with  past medical of hypertension, who presents with the above admission  complaint.  The patient states that she has been complaining of increasing  bilateral lower extremity swelling in the last two weeks which had gone down  and has increased, and now in the middle of her lower extremities  bilaterally.  She was also complaining of oliguria, and now she is going to  the bathroom around 2 to 3 times with a small amount of urine.  At night  there is micturition, and in the night hematuria.  There is no shortness of  breath, no chest pain.  The patient went to [facility] and saw her  primary care physician today who recommended she come to the emergency room  after doing some blood tests on her. In the ER, she was found to be in acute  renal failure with creatinine of 10.5 and BUN of 96, and metabolic acidosis  with pH of 1.61 and bicarb of 40.   PAST MEDICAL HISTORY:  Significant for:  1.  Hypertension.  2.  Hyperlipidemia.  3.  Hypothyroidism.  4.  Depression.   PAST SURGICAL HISTORY:  1.  Left arm procedure for carpal tunnel syndrome done around 1 month ago.  2.  Dental procedures done around 1 month ago.   ALLERGIES:  No known drug allergies.   MEDICATIONS:  1.  Atenolol 50 mg once daily.  2.  Hydrochlorothiazide 25 mg once daily.  3.  Lexapro 20 mg once daily.  4.  Lipitor 20 mg once daily.  5.  Synthroid 0.125 mg once  daily.  6.  Combivent 1 puff q. 6 h p.r.n. for seasonal allergies.   SOCIAL HISTORY:  The patient quit smoking around 11 years ago.  She also  quit alcohol drinking around the same time, 11 years ago.  Denies  recreational drugs.  She lives at home with family.   FAMILY HISTORY:  Significant for diabetes mellitus in her mother in addition  to hypertension, coronary artery disease and hypercholesterolemia in her  mother.   REVIEW OF SYSTEMS:  Notable for bilateral lower extremity edema, mild  wheezes related to seasonal allergies, and small bruises on her right  forearm.  Other Review of Systems, CONSTITUTIONAL: There is no fever, no  fatigability, no weakness. EYES: No blurred vision, no pain, no redness.  ENT: No tinnitus, no ear pain, no hearing loss, no difficulty swallowing.  RESPIRATORY: No cough, no wheeze, no hemoptysis.  CARDIOVASCULAR: No chest  pain, no palpitations,  no syncope.  Positive for bilateral lower extremity  edema. GI: No nausea, vomiting, no diarrhea, no abdominal pain, no  hematemesis, no constipation. GU: Positive for oliguria, but there is no  dysuria, no hematuria, and no increase in frequency.  ENDOCRINE: No  polyuria, no nocturia.  No smoking.  HEMATOLOGY: Positive for small bruises  in the right upper arm but no bleeding.  INFECTIOUS DISEASE:  No rash and no  lesions. NEUROLOGIC: No numbness, no tingling, no weakness, no dysarthria,  no tremors, and no headache.  All other systems were reviewed, and they were  negative.   PHYSICAL EXAMINATION:  VITAL SIGNS: Temperature 98.3, blood pressure 145/91,  heart rate 84, respiratory rate 20.  HEENT: Normal.  There is no pallor and no erythema.  Pupils equal, round,  and reactive to light and accommodation.  There is no ptosis.  Hearing is  intact.  There is no ear discharge or infection.  There is no nasal  discharge, infection, or bleeding.  Oral mucosa is moist, and there is no  pharyngeal erythema.  NECK:   Supple.  No JVD, no carotid bruit, no lymphadenopathy, no thyroid  enlargement or thyroid tenderness.  CARDIOVASCULAR:  S1 and S2 were heard, and they are regular.  There are no  additional heart sounds, no murmurs, no gallops, no thrills.  Carotid pulse  is strong, regular, with no bruit. Femoral pulses strong, regular, with no  bruit.  LUNGS: Patient is breathing between 12 to 16 with no use of accessory  muscles and no intercostal retractions.  There is no dullness, no  hyperresonance.  Auscultation showed normal breath sounds.  There are  bilateral expiratory wheezes.  Negative for rales, and there is no rhonchi.  ABDOMEN:  Soft, nontender, nondistended.  No hepatosplenomegaly, and bowel  sounds are normal.  EXTREMITIES:  Lower extremities showed +2 edema which is  pitting edema.  There are no varicose veins, and pedal pulse is mildly diminished, around 1+  bilaterally.  SKIN:  Showed bruises.  One bruise is around 1.5 x 2 cm on the right  forearm.  There are varicose veins.  NEUROLOGIC:  Cranial nerves II-XII intact.  Reflexes are 2/2 in all  reflexes, and strength is 5/5 in all four extremities.   LABORATORY DATA:  WBC 12.8, hemoglobin 9.7, hematocrit 27.8, MCV 85.6,  platelet count 406.  Sodium 132, potassium 4.8, chloride 108, BUN 96,  creatinine 10.5, serum glucose 135. ABGs done have showed pH of 7.287, pCO2  31, bicarb 14.8.   EKG: Normal sinus rhythm with no evidence of acute ischemia.  There is no ST  segment abnormalities.   Chest x-ray showed no congestion and new infiltrates.   IMPRESSION:  This is a 65 year old female with past medical history of  hypertension who presented with bilateral lower extremity edema and  oliguria.   ASSESSMENT AND PLAN:  1.  Acute renal failure. As mentioned, this patient has no previous history      of renal problems,  currently had a BUN of 106 and creatinine of 10.8.     The ratio of her BUN to creatinine is less than 20 which is  suggestive      of renal course rather than prerenal course.  The patient also has acute      metabolic acidosis which might be consistent with acute renal failure,      although her potassium is normal at the present time.  The patient seems  to be more volume overloaded rather than dehydrated; so we will give her      80 mg Lasix IV.  We will also send for 24-hour urine volume, 24-hour      urine creatinine and protein, and 24-hour urine sodium, and we will get      renal ultrasound for further evaluation.  I already spoke with the      nephrologist on call, and he agrees to the plan, and he will assess the      patient in the morning for possible dialysis.  2.  Leukocytosis.  There is no evidence of infection at the present time.      We will send for a urinalysis, sputum culture and blood cultures x2.  3.  Normocytic anemia.  We will follow CBC. We will get anemia profile.  4.  Hyponatremia.  This can be related to her renal failure with volume      overload; so we will restrict fluids, and we will give her Lasix IV.  5.  Hypertension.  We will continue her current medications.  6.  Hyperlipidemia. We will continue current medications.  I will get lipid      profile.  7.  Hypothyroidism. The patient is getting Synthroid.  We will continue that      and will get TSH level.  8.  Depression.  We will continue Lexapro.           ______________________________  Michelene Gardener, MD     NAE/MEDQ  D:  04/29/2005  T:  04/29/2005  Job:  [id number]   cc:   Otilio Connors. Gerri Spore, M.D.  Fax: [phone]

## 2010-05-27 NOTE — Assessment & Plan Note (Signed)
[facility]                             PULMONARY OFFICE NOTE   NAME:[name], [name] KLIMAS                   MRN:          [id number]  DATE:12/25/2005                            DOB:          05-01-1945    SUBJECTIVE:  [name] is a 65 year old woman with moderate to severe  COPD and exertional hypoxemia. She follows up today for a regular  scheduled appointment. She tells me that she has been doing fairly well  since our last visit with good exertional tolerance. She has found it a  bit harder to breathe when she carries out her usual activities since  the cold weather has begun. She has also had some nasal congestion.  Otherwise, she has no new complaints. She has not required  hospitalization for an exacerbation of COPD and no one has given her  prednisone or antibiotics since our last visit in June. Her flu shot is  up-to-date and she had a Pneumovax 2 years ago.   OBJECTIVE:  In general, this is a pleasant, obese woman who is in no  distress on room air. Her weight is 164 pounds, temperature is 98.0,  blood pressure 110/66, heart rate is 73, SPO2 95% on room air.  HEENT: Is benign. She has a large neck without lymphadenopathy or  stridor.  LUNGS:  Have end-expiratory wheezing on a forced expiration. She is  clear during her normal respiratory cycle. There are no crackles.  HEART: Has a regular rate and rhythm without murmur.  ABDOMEN: Is obese, soft and nontender with positive bowel sounds.  EXTREMITIES: Have no cyanosis, clubbing or edema.  NEUROLOGIC: She has a nonfocal exam.   MEDICATIONS:  1. Lasix 40 mg daily.  2. Lipitor 20 mg daily.  3. Synthroid 0.125 mg daily.  4. Lexapro 20 mg daily.  5. Atenolol 50 mg daily.  6. Hydrochlorothiazide 25 mg daily.  7. Aspirin 81 mg daily.  8. Spiriva 1 inhalation daily.  9. Proventil 2 puffs q. 4 hours p.r.n. for shortness of breath.   IMPRESSION:  1. Moderate to severe chronic obstructive  pulmonary disease that      appears to be stable.  2. Exertional hypoxemia.  3. Mild nasal congestion.   ACTION PLAN:  1. Continue Spiriva on a daily basis and albuterol p.r.n.  2. Oxygen with exertion per her current regimen.  3. She will follow up with me in 6 months or sooner should she have      any breathing difficulties.      We reviewed the signs and symptoms consistent with a chronic      obstructive pulmonary disease exacerbation today and I asked her to      call me if she develops these.     [name] Peer, MD  Electronically Signed    RSB/MedQ  DD: 12/25/2005  DT: 12/25/2005  Job #: [id number]   cc:   [name] [name], M.D.  [name] [name], M.D.

## 2010-05-31 ENCOUNTER — Ambulatory Visit: Payer: BC Managed Care – PPO | Admitting: Emergency Medicine

## 2010-06-13 ENCOUNTER — Ambulatory Visit (INDEPENDENT_AMBULATORY_CARE_PROVIDER_SITE_OTHER): Payer: BC Managed Care – PPO | Admitting: Emergency Medicine

## 2010-06-13 ENCOUNTER — Encounter: Payer: Self-pay | Admitting: Emergency Medicine

## 2010-06-13 DIAGNOSIS — J449 Chronic obstructive pulmonary disease, unspecified: Secondary | ICD-10-CM

## 2010-06-13 DIAGNOSIS — J309 Allergic rhinitis, unspecified: Secondary | ICD-10-CM

## 2010-06-13 NOTE — Progress Notes (Deleted)
  Subjective:    Patient ID: [name] [name], female    DOB: 06-03-1945, 65 y.o.   MRN: [id number]  HPI    Review of Systems     Objective:   Physical Exam        Assessment & Plan:

## 2010-06-13 NOTE — Patient Instructions (Addendum)
Continue Spiriva Use fluticasone nasal spray twice a day. Start doing nasal washes before your evening fluticasone Continue allegra twice a day Follow up with Dr Delton Coombes in 4 months or sooner if needed.

## 2010-06-13 NOTE — Assessment & Plan Note (Signed)
continue regimen

## 2010-06-13 NOTE — Assessment & Plan Note (Signed)
spiriva 

## 2010-06-13 NOTE — Progress Notes (Signed)
  Subjective:    Patient ID: [name] [name], female    DOB: 12-18-1945, 65 y.o.   MRN: [id number]  HPI [name] [name] is a 65 year old with moderate COPD.  ROV 12/01/08 -- No exacerbation since last visit. Has been maintained on Spiriva once daily. Has ventolin available to use as needed, not using it every day. Exertional tolerance is about the same. Uses O2 with some exertion but not all. Wants to know if she can stop using O2 at night. Has had her flu shot.   Acute visit 06/17/09 -- Moderate COPD, presents with about a week of worsening PND, dry cough, chest tightness. Has been using ventolin more often. No sick contacts, no f/c/sweats. Some HA. Has heard wheezing. Not on fexofenadine right now.   ROV 11/23/09 -- COPD. Treated her for a possible flare of her allergies last time with Pred taper. Was better for a while. Now experiencing more dry cough for the last week, rarely productive, doesn't really notice any more nasal drainage. Has been on fexofanadine since last time.   ROV 01/05/10 -- moderate COPD, allergic rhinitis. Last time we held Spiriva x 2 weeks, added fluticasone to fexofenadine. Discussed NSWs, she did daily for a while, now using as needed. Her cough and nasal gtt seem to both be better. She did miss the Spiriva when off of it, breathing back to baseline.    04/08/2010  Sick visit >>Increased DOE, heavy chest, green phlegm - sudden onset x 1 day, no preceding URI, no aggravavting or relieving factor, no change in environment on questioning. Is using oxygen x 1 day continuous  Uses O2 during evenings only  CXR - no infiltrate  ROV 05/10/10 -- COPD, allergic rhinitis. Seen for possible AE end of March, treated for pred + abx, improved. Last couple days more mucous and cough, yellowish phlegm. No sick contacts. No real change in allergy sx - takes allegra and nasal spray reliably. Takes spiriva, stopped it this week to see if it was irritating her throat.   ROV 06/13/10 -- COPD,  chronic cough, allergies. The cough is coming in paroxysms. She is doing [place], Careers adviser, fluticasone spray. She stopped Spiriva and needed to go back on.     Review of Systems As per HPI    Objective:   Physical Exam Gen: Pleasant, well-nourished, in no distress,  normal affect  ENT: No lesions,  mouth clear,  Mild stridor.   Neck: No JVD, no TMG, no carotid bruits  Lungs: No use of accessory muscles, no dullness to percussion, clear without rales or rhonchi  Cardiovascular: RRR, heart sounds normal, no murmur or gallops, no peripheral edema  Musculoskeletal: No deformities, no cyanosis or clubbing  Neuro: alert, non focal  Skin: Warm, no lesions or rashes        Assessment & Plan:

## 2010-12-26 ENCOUNTER — Telehealth: Payer: Self-pay | Admitting: Emergency Medicine

## 2010-12-26 NOTE — Telephone Encounter (Signed)
I spoke with pt and she states she has new insurance since she has retired and did not know if she needed to f/u with RB. I advised her per last ov 06/2010 she needed to f/u in 4 months. Pt is scheduled to see RB on 12/29/10 at 9:45.

## 2010-12-29 ENCOUNTER — Encounter: Payer: Self-pay | Admitting: Emergency Medicine

## 2010-12-29 ENCOUNTER — Ambulatory Visit (INDEPENDENT_AMBULATORY_CARE_PROVIDER_SITE_OTHER): Payer: Medicare Other | Admitting: Emergency Medicine

## 2010-12-29 VITALS — BP 110/62 | HR 81 | Temp 98.2°F | Ht 61.0 in | Wt 169.6 lb

## 2010-12-29 DIAGNOSIS — J449 Chronic obstructive pulmonary disease, unspecified: Secondary | ICD-10-CM

## 2010-12-29 MED ORDER — DOXYCYCLINE HYCLATE 100 MG PO TABS: 100.0000 mg | ORAL_TABLET | Freq: Two times a day (BID) | ORAL | Status: AC

## 2010-12-29 NOTE — Progress Notes (Signed)
  Subjective:    Patient ID: [name] [name], female    DOB: [birth year], 65 y.o.   MRN: [id number]  HPI [name] is a 65 year old with moderate COPD.  ROV 12/01/08 -- No exacerbation since last visit. Has been maintained on Spiriva once daily. Has ventolin available to use as needed, not using it every day. Exertional tolerance is about the same. Uses O2 with some exertion but not all. Wants to know if she can stop using O2 at night. Has had her flu shot.   Acute visit 06/17/09 -- Moderate COPD, presents with about a week of worsening PND, dry cough, chest tightness. Has been using ventolin more often. No sick contacts, no f/c/sweats. Some HA. Has heard wheezing. Not on fexofenadine right now.   ROV 11/23/09 -- COPD. Treated her for a possible flare of her allergies last time with Pred taper. Was better for a while. Now experiencing more dry cough for the last week, rarely productive, doesn't really notice any more nasal drainage. Has been on fexofanadine since last time.   ROV 01/05/10 -- moderate COPD, allergic rhinitis. Last time we held Spiriva x 2 weeks, added fluticasone to fexofenadine. Discussed NSWs, she did daily for a while, now using as needed. Her cough and nasal gtt seem to both be better. She did miss the Spiriva when off of it, breathing back to baseline.    04/08/2010  Sick visit >>Increased DOE, heavy chest, green phlegm - sudden onset x 1 day, no preceding URI, no aggravavting or relieving factor, no change in environment on questioning. Is using oxygen x 1 day continuous  Uses O2 during evenings only  CXR - no infiltrate  ROV 05/10/10 -- COPD, allergic rhinitis. Seen for possible AE end of March, treated for pred + abx, improved. Last couple days more mucous and cough, yellowish phlegm. No sick contacts. No real change in allergy sx - takes allegra and nasal spray reliably. Takes spiriva, stopped it this week to see if it was irritating her throat.   ROV 06/13/10 -- COPD,  chronic cough, allergies. The cough is coming in paroxysms. She is doing [place], Careers adviser, fluticasone spray. She stopped Spiriva and needed to go back on.   ROV 12/29/10 -- moderate COPD, chronic cough, allergies. Has been having more cough, now producing cream colored mucous, developed URI about a week ago. Of note she was rx with pred and started on Advair in addition to Spiriva in setting AE several months ago.     Review of Systems As per HPI    Objective:   Physical Exam Gen: Pleasant, well-nourished, in no distress,  normal affect  ENT: No lesions,  mouth clear,  No stridor.   Neck: No JVD, no TMG, no carotid bruits  Lungs: No use of accessory muscles, no dullness to percussion, clear without rales or rhonchi  Cardiovascular: RRR, heart sounds normal, no murmur or gallops, no peripheral edema  Musculoskeletal: No deformities, no cyanosis or clubbing  Neuro: alert, non focal  Skin: Warm, no lesions or rashes    Assessment & Plan:  CHRONIC OBSTRUCTIVE PULMONARY DISEASE, MODERATE Probable bronchitis without bronchospasm at this time.  - continue both the spiriva and advair (advair doesn't seem to be bothering her throat yet) - doxy x 7 days - she will call if wheeze develops - would need pred if this evolves - rov 6 months

## 2010-12-29 NOTE — Assessment & Plan Note (Signed)
Probable bronchitis without bronchospasm at this time.  - continue both the spiriva and advair (advair doesn't seem to be bothering her throat yet) - doxy x 7 days - she will call if wheeze develops - would need pred if this evolves - rov 6 months

## 2010-12-29 NOTE — Patient Instructions (Signed)
Please continue both Spiriva and Advair for now. We may try to stop one of these in the future to see if you miss it.  Take doxycycline for 7 days as directed.  Call our office if you develop wheezing or shortness of breath Follow with Dr Delton Coombes in 6 months or sooner if you have any problems

## 2011-06-09 ENCOUNTER — Other Ambulatory Visit: Payer: Self-pay | Admitting: Family Medicine

## 2011-06-09 DIAGNOSIS — Z1231 Encounter for screening mammogram for malignant neoplasm of breast: Secondary | ICD-10-CM

## 2011-07-05 ENCOUNTER — Ambulatory Visit
Admission: RE | Admit: 2011-07-05 | Discharge: 2011-07-05 | Disposition: A | Discharge status: Home / Self Care | Payer: Medicare Other | Source: Ambulatory Visit | Attending: Family Medicine | Admitting: Family Medicine

## 2011-07-05 DIAGNOSIS — Z1231 Encounter for screening mammogram for malignant neoplasm of breast: Secondary | ICD-10-CM

## 2011-08-01 ENCOUNTER — Encounter: Payer: Self-pay | Admitting: Emergency Medicine

## 2011-08-01 ENCOUNTER — Ambulatory Visit (INDEPENDENT_AMBULATORY_CARE_PROVIDER_SITE_OTHER): Payer: Medicare Other | Admitting: Emergency Medicine

## 2011-08-01 VITALS — BP 118/78 | HR 78 | Temp 97.9°F | Ht 60.0 in | Wt 156.8 lb

## 2011-08-01 DIAGNOSIS — J309 Allergic rhinitis, unspecified: Secondary | ICD-10-CM

## 2011-08-01 DIAGNOSIS — J449 Chronic obstructive pulmonary disease, unspecified: Secondary | ICD-10-CM

## 2011-08-01 NOTE — Assessment & Plan Note (Addendum)
Continue allegra Add back nasal steroid and [place] this fall

## 2011-08-01 NOTE — Patient Instructions (Addendum)
Continue your inhaled medications as you are taking them Continue your fexofenadine (allegra) Add back your nasal steroid spray and your nasal saline washes this Fall in preparation for allergy season Follow with Dr Delton Coombes in 6 months or sooner if you have any problems

## 2011-08-01 NOTE — Assessment & Plan Note (Signed)
Stable at this time - continue same regimen  - rov 6 months

## 2011-08-01 NOTE — Progress Notes (Signed)
Subjective:    Patient ID: [name] [name], female    DOB: 1945-05-22, 66 y.o.   MRN: [id number]  HPI [name] is a 66 year old with moderate COPD.  ROV 12/01/08 -- No exacerbation since last visit. Has been maintained on Spiriva once daily. Has ventolin available to use as needed, not using it every day. Exertional tolerance is about the same. Uses O2 with some exertion but not all. Wants to know if she can stop using O2 at night. Has had her flu shot.   Acute visit 06/17/09 -- Moderate COPD, presents with about a week of worsening PND, dry cough, chest tightness. Has been using ventolin more often. No sick contacts, no f/c/sweats. Some HA. Has heard wheezing. Not on fexofenadine right now.   ROV 11/23/09 -- COPD. Treated her for a possible flare of her allergies last time with Pred taper. Was better for a while. Now experiencing more dry cough for the last week, rarely productive, doesn't really notice any more nasal drainage. Has been on fexofanadine since last time.   ROV 01/05/10 -- moderate COPD, allergic rhinitis. Last time we held Spiriva x 2 weeks, added fluticasone to fexofenadine. Discussed NSWs, she did daily for a while, now using as needed. Her cough and nasal gtt seem to both be better. She did miss the Spiriva when off of it, breathing back to baseline.    04/08/2010  Sick visit >>Increased DOE, heavy chest, green phlegm - sudden onset x 1 day, no preceding URI, no aggravavting or relieving factor, no change in environment on questioning. Is using oxygen x 1 day continuous  Uses O2 during evenings only  CXR - no infiltrate  ROV 05/10/10 -- COPD, allergic rhinitis. Seen for possible AE end of March, treated for pred + abx, improved. Last couple days more mucous and cough, yellowish phlegm. No sick contacts. No real change in allergy sx - takes allegra and nasal spray reliably. Takes spiriva, stopped it this week to see if it was irritating her throat.   ROV 06/13/10 -- COPD,  chronic cough, allergies. The cough is coming in paroxysms. She is doing [place], Careers adviser, fluticasone spray. She stopped Spiriva and needed to go back on.   ROV 12/29/10 -- moderate COPD, chronic cough, allergies. Has been having more cough, now producing cream colored mucous, developed URI about a week ago. Of note she was rx with pred and started on Advair in addition to Spiriva in setting AE several months ago.   ROV 08/01/11 -- moderate COPD, chronic cough, allergies. Returns for f/u. Has been managed with Spiriva + Advair. She was tried on lisinopril but had to change to losartan due to cough. Also started on metformin. Has been doing fairly well. No exacerbations. She uses SABA rarely.     Review of Systems As per HPI    Objective:   Physical Exam Gen: Pleasant, well-nourished, in no distress,  normal affect  ENT: No lesions,  mouth clear,  No stridor.   Neck: No JVD, no TMG, no carotid bruits  Lungs: No use of accessory muscles, no dullness to percussion, few basilar insp crackles.   Cardiovascular: RRR, heart sounds normal, no murmur or gallops, no peripheral edema  Musculoskeletal: No deformities, no cyanosis or clubbing  Neuro: alert, non focal  Skin: Warm, no lesions or rashes    Assessment & Plan:  CHRONIC OBSTRUCTIVE PULMONARY DISEASE, MODERATE Stable at this time - continue same regimen  - rov 6 months  ALLERGIC RHINITIS  Continue allegra Add back nasal steroid and [place] this fall

## 2012-01-29 ENCOUNTER — Encounter: Payer: Self-pay | Admitting: Adult Health

## 2012-01-29 ENCOUNTER — Ambulatory Visit (INDEPENDENT_AMBULATORY_CARE_PROVIDER_SITE_OTHER)
Admission: RE | Admit: 2012-01-29 | Discharge: 2012-01-29 | Disposition: A | Discharge status: Home / Self Care | Payer: Medicare Other | Source: Ambulatory Visit | Attending: Adult Health | Admitting: Adult Health

## 2012-01-29 ENCOUNTER — Ambulatory Visit (INDEPENDENT_AMBULATORY_CARE_PROVIDER_SITE_OTHER): Payer: Medicare Other | Admitting: Adult Health

## 2012-01-29 VITALS — BP 132/74 | HR 94 | Temp 98.6°F | Ht 60.0 in | Wt 154.8 lb

## 2012-01-29 DIAGNOSIS — R05 Cough: Secondary | ICD-10-CM

## 2012-01-29 DIAGNOSIS — J449 Chronic obstructive pulmonary disease, unspecified: Secondary | ICD-10-CM

## 2012-01-29 DIAGNOSIS — R059 Cough, unspecified: Secondary | ICD-10-CM

## 2012-01-29 MED ORDER — PREDNISONE 10 MG PO TABS: ORAL_TABLET | ORAL | Status: DC

## 2012-01-29 MED ORDER — AMOXICILLIN-POT CLAVULANATE 875-125 MG PO TABS: 1.0000 | ORAL_TABLET | Freq: Two times a day (BID) | ORAL | Status: AC

## 2012-01-29 NOTE — Patient Instructions (Addendum)
Augmentin 875mg  Twice daily  For 7 days  Mucinex DM Twice daily  As needed   Prednisone taper over next week.  Fluids and rest  Tylenol As needed   follow up Dr. Delton Coombes  Next week as planned and As needed   Please contact office for sooner follow up if symptoms do not improve or worsen or seek emergency care

## 2012-02-02 ENCOUNTER — Telehealth: Payer: Self-pay | Admitting: Emergency Medicine

## 2012-02-02 ENCOUNTER — Encounter: Payer: Self-pay | Admitting: Adult Health

## 2012-02-02 NOTE — Progress Notes (Signed)
Subjective:    Patient ID: [name] [name], female    DOB: 07-May-1945, 67 y.o.   MRN: [id number]  HPI [name] [name] is a 67 year old with moderate COPD.  ROV 12/01/08 -- No exacerbation since last visit. Has been maintained on Spiriva once daily. Has ventolin available to use as needed, not using it every day. Exertional tolerance is about the same. Uses O2 with some exertion but not all. Wants to know if she can stop using O2 at night. Has had her flu shot.   Acute visit 06/17/09 -- Moderate COPD, presents with about a week of worsening PND, dry cough, chest tightness. Has been using ventolin more often. No sick contacts, no f/c/sweats. Some HA. Has heard wheezing. Not on fexofenadine right now.   ROV 11/23/09 -- COPD. Treated her for a possible flare of her allergies last time with Pred taper. Was better for a while. Now experiencing more dry cough for the last week, rarely productive, doesn't really notice any more nasal drainage. Has been on fexofanadine since last time.   ROV 01/05/10 -- moderate COPD, allergic rhinitis. Last time we held Spiriva x 2 weeks, added fluticasone to fexofenadine. Discussed NSWs, she did daily for a while, now using as needed. Her cough and nasal gtt seem to both be better. She did miss the Spiriva when off of it, breathing back to baseline.    04/08/2010  Sick visit >>Increased DOE, heavy chest, green phlegm - sudden onset x 1 day, no preceding URI, no aggravavting or relieving factor, no change in environment on questioning. Is using oxygen x 1 day continuous  Uses O2 during evenings only  CXR - no infiltrate  ROV 05/10/10 -- COPD, allergic rhinitis. Seen for possible AE end of March, treated for pred + abx, improved. Last couple days more mucous and cough, yellowish phlegm. No sick contacts. No real change in allergy sx - takes allegra and nasal spray reliably. Takes spiriva, stopped it this week to see if it was irritating her throat.   ROV 06/13/10 -- COPD,  chronic cough, allergies. The cough is coming in paroxysms. She is doing [place], Careers adviser, fluticasone spray. She stopped Spiriva and needed to go back on.   ROV 12/29/10 -- moderate COPD, chronic cough, allergies. Has been having more cough, now producing cream colored mucous, developed URI about a week ago. Of note she was rx with pred and started on Advair in addition to Spiriva in setting AE several months ago.   ROV 08/01/11 -- moderate COPD, chronic cough, allergies. Returns for f/u. Has been managed with Spiriva + Advair. She was tried on lisinopril but had to change to losartan due to cough. Also started on metformin. Has been doing fairly well. No exacerbations. She uses SABA rarely.   Acute OV 01/29/12  Complains of increased SOB, wheezing, dry cough, chest congestion, head congestion w/ clear drainage, PND w/ sore throat, joint aches x2 days .  OTC not helping with cough .  No fever , chest pain , hemoptysis , leg swelling or n/v/d.  Doing well until last few days. Cough and congestion getting worse for last 2 days.     Review of Systems As per HPI    Objective:   Physical Exam Gen: Pleasant, well-nourished, in no distress,  normal affect  ENT: No lesions,  mouth clear  Neck: No JVD, no TMG, no carotid bruits  Lungs: No use of accessory muscles, no dullness to percussion, few basilar insp crackles.  Cardiovascular: RRR, heart sounds normal, no murmur or gallops, no peripheral edema  Musculoskeletal: No deformities, no cyanosis or clubbing  Neuro: alert, non focal  Skin: Warm, no lesions or rashes    Assessment & Plan:

## 2012-02-02 NOTE — Progress Notes (Signed)
Quick Note:  Spoke with patient.Informed patient of results/recs per TP as listed below. Patient verbalized understanding and nothing further needed at this time.  ______

## 2012-02-02 NOTE — Progress Notes (Signed)
Quick Note:  LMOM TCB x1. ______ 

## 2012-02-02 NOTE — Telephone Encounter (Signed)
Spoke with patient, patient returning call from [place].  Informed patient of results/recs per TP as listed below. Patient verbalized understanding and nothing further needed at this time.  Notes Recorded by Julio Sicks, NP on 01/30/2012 at 2:25 PM No sign of PNA  Cont w/ ov recs  Please contact office for sooner follow up if symptoms do not improve or worsen or seek emergency care

## 2012-02-02 NOTE — Assessment & Plan Note (Signed)
Flare  Check xray   Plan  Augmentin 875mg  Twice daily  For 7 days  Mucinex DM Twice daily  As needed   Prednisone taper over next week.  Fluids and rest  Tylenol As needed   follow up Dr. Delton Coombes  Next week as planned and As needed   Please contact office for sooner follow up if symptoms do not improve or worsen or seek emergency care

## 2012-02-06 ENCOUNTER — Encounter: Payer: Self-pay | Admitting: Emergency Medicine

## 2012-02-06 ENCOUNTER — Ambulatory Visit (INDEPENDENT_AMBULATORY_CARE_PROVIDER_SITE_OTHER): Payer: Medicare Other | Admitting: Emergency Medicine

## 2012-02-06 VITALS — BP 108/70 | HR 66 | Temp 96.8°F | Ht 61.0 in | Wt 151.6 lb

## 2012-02-06 DIAGNOSIS — J449 Chronic obstructive pulmonary disease, unspecified: Secondary | ICD-10-CM

## 2012-02-06 NOTE — Patient Instructions (Addendum)
Continue your spiriva daily, Advair twice a day  Use albuterol as needed Continue your exercise regimen and weight loss  Follow with Dr Delton Coombes in 4 months or sooner if you have any problems.

## 2012-02-06 NOTE — Assessment & Plan Note (Signed)
Improved after AE - continue current BD's - rov 4 months

## 2012-02-06 NOTE — Progress Notes (Signed)
Subjective:    Patient ID: [name] [name], female    DOB: 19-Apr-1945, 67 y.o.   MRN: [id number]  HPI [name] [name] is a 67 year old with moderate COPD.  ROV 12/01/08 -- No exacerbation since last visit. Has been maintained on Spiriva once daily. Has ventolin available to use as needed, not using it every day. Exertional tolerance is about the same. Uses O2 with some exertion but not all. Wants to know if she can stop using O2 at night. Has had her flu shot.   Acute visit 06/17/09 -- Moderate COPD, presents with about a week of worsening PND, dry cough, chest tightness. Has been using ventolin more often. No sick contacts, no f/c/sweats. Some HA. Has heard wheezing. Not on fexofenadine right now.   ROV 11/23/09 -- COPD. Treated her for a possible flare of her allergies last time with Pred taper. Was better for a while. Now experiencing more dry cough for the last week, rarely productive, doesn't really notice any more nasal drainage. Has been on fexofanadine since last time.   ROV 01/05/10 -- moderate COPD, allergic rhinitis. Last time we held Spiriva x 2 weeks, added fluticasone to fexofenadine. Discussed NSWs, she did daily for a while, now using as needed. Her cough and nasal gtt seem to both be better. She did miss the Spiriva when off of it, breathing back to baseline.    04/08/2010  Sick visit >>Increased DOE, heavy chest, green phlegm - sudden onset x 1 day, no preceding URI, no aggravavting or relieving factor, no change in environment on questioning. Is using oxygen x 1 day continuous  Uses O2 during evenings only  CXR - no infiltrate  ROV 05/10/10 -- COPD, allergic rhinitis. Seen for possible AE end of March, treated for pred + abx, improved. Last couple days more mucous and cough, yellowish phlegm. No sick contacts. No real change in allergy sx - takes allegra and nasal spray reliably. Takes spiriva, stopped it this week to see if it was irritating her throat.   ROV 06/13/10 -- COPD,  chronic cough, allergies. The cough is coming in paroxysms. She is doing [place], Careers adviser, fluticasone spray. She stopped Spiriva and needed to go back on.   ROV 12/29/10 -- moderate COPD, chronic cough, allergies. Has been having more cough, now producing cream colored mucous, developed URI about a week ago. Of note she was rx with pred and started on Advair in addition to Spiriva in setting AE several months ago.   ROV 08/01/11 -- moderate COPD, chronic cough, allergies. Returns for f/u. Has been managed with Spiriva + Advair. She was tried on lisinopril but had to change to losartan due to cough. Also started on metformin. Has been doing fairly well. No exacerbations. She uses SABA rarely.   Acute OV 68/20/14  Complains of increased SOB, wheezing, dry cough, chest congestion, head congestion w/ clear drainage, PND w/ sore throat, joint aches x2 days .  OTC not helping with cough .  No fever , chest pain , hemoptysis , leg swelling or n/v/d.  Doing well until last few days. Cough and congestion getting worse for last 2 days.   ROV 02/06/12 -- moderate COPD, chronic cough, allergies. Was seen a week ago by TP for AE, improved. CXR normal. She is much improved. She is on spiriva, SABA prn.     Review of Systems As per HPI    Objective:   Physical Exam Gen: Pleasant, well-nourished, in no distress,  normal affect  ENT: No lesions,  mouth clear  Neck: No JVD, no TMG, no carotid bruits  Lungs: No use of accessory muscles, few basilar insp crackles. No wheeze  Cardiovascular: RRR, heart sounds normal, no murmur or gallops, no peripheral edema  Musculoskeletal: No deformities, no cyanosis or clubbing  Neuro: alert, non focal  Skin: Warm, no lesions or rashes    Assessment & Plan:   CHRONIC OBSTRUCTIVE PULMONARY DISEASE, MODERATE Improved after AE - continue current BD's - rov 4 months

## 2012-03-13 ENCOUNTER — Telehealth: Payer: Self-pay | Admitting: Emergency Medicine

## 2012-03-13 MED ORDER — PREDNISONE 20 MG PO TABS: ORAL_TABLET | ORAL | Status: DC

## 2012-03-13 NOTE — Telephone Encounter (Signed)
Called and spoke with pt and she stated that she has a cough that is non productive.  Pt stated that she does not feel bad, but her primary care doctor felt that she should contact RB to get back on the pred and abx before her symptoms get worse.  RB please advise. Thanks  Allergies  Allergen Reactions  . Lisinopril     cough  . Sulfonamide Derivatives

## 2012-03-13 NOTE — Telephone Encounter (Signed)
Suspect this is UA irritation, has been a recurrent problem for her. No way for me to say without assessing her. She can take a pred burst. If not resolved after this then needs to be seen in office.   Pred 40mg  daily x 5 days then stop

## 2012-03-13 NOTE — Telephone Encounter (Signed)
Spoke with patient made her aware of recs as listed below per RB.  Patient requesting medication be sent to [facility] in [place], [place] Nothing further needed at this time.

## 2012-05-29 ENCOUNTER — Encounter: Payer: Self-pay | Admitting: Emergency Medicine

## 2012-05-29 ENCOUNTER — Ambulatory Visit (INDEPENDENT_AMBULATORY_CARE_PROVIDER_SITE_OTHER): Payer: Medicare Other | Admitting: Emergency Medicine

## 2012-05-29 VITALS — BP 116/70 | HR 74 | Temp 97.7°F | Ht 60.0 in | Wt 155.4 lb

## 2012-05-29 DIAGNOSIS — J309 Allergic rhinitis, unspecified: Secondary | ICD-10-CM

## 2012-05-29 DIAGNOSIS — J449 Chronic obstructive pulmonary disease, unspecified: Secondary | ICD-10-CM

## 2012-05-29 MED ORDER — ALBUTEROL SULFATE HFA 108 (90 BASE) MCG/ACT IN AERS: 2.0000 | INHALATION_SPRAY | Freq: Four times a day (QID) | RESPIRATORY_TRACT | Status: DC | PRN

## 2012-05-29 NOTE — Progress Notes (Signed)
Subjective:    Patient ID: [name], female    DOB: 07-Nov-1945, 67 y.o.   MRN: [id number]  HPI Ms. Oshields is a 67 year old with moderate COPD.  ROV 12/01/08 -- No exacerbation since last visit. Has been maintained on Spiriva once daily. Has ventolin available to use as needed, not using it every day. Exertional tolerance is about the same. Uses O2 with some exertion but not all. Wants to know if she can stop using O2 at night. Has had her flu shot.   Acute visit 06/17/09 -- Moderate COPD, presents with about a week of worsening PND, dry cough, chest tightness. Has been using ventolin more often. No sick contacts, no f/c/sweats. Some HA. Has heard wheezing. Not on fexofenadine right now.   ROV 11/23/09 -- COPD. Treated her for a possible flare of her allergies last time with Pred taper. Was better for a while. Now experiencing more dry cough for the last week, rarely productive, doesn't really notice any more nasal drainage. Has been on fexofanadine since last time.   ROV 01/05/10 -- moderate COPD, allergic rhinitis. Last time we held Spiriva x 2 weeks, added fluticasone to fexofenadine. Discussed NSWs, she did daily for a while, now using as needed. Her cough and nasal gtt seem to both be better. She did miss the Spiriva when off of it, breathing back to baseline.    04/08/2010  Sick visit >>Increased DOE, heavy chest, green phlegm - sudden onset x 1 day, no preceding URI, no aggravavting or relieving factor, no change in environment on questioning. Is using oxygen x 1 day continuous  Uses O2 during evenings only  CXR - no infiltrate  ROV 05/10/10 -- COPD, allergic rhinitis. Seen for possible AE end of March, treated for pred + abx, improved. Last couple days more mucous and cough, yellowish phlegm. No sick contacts. No real change in allergy sx - takes allegra and nasal spray reliably. Takes spiriva, stopped it this week to see if it was irritating her throat.   ROV 06/13/10 -- COPD,  chronic cough, allergies. The cough is coming in paroxysms. She is doing [place], Careers adviser, fluticasone spray. She stopped Spiriva and needed to go back on.   ROV 12/29/10 -- moderate COPD, chronic cough, allergies. Has been having more cough, now producing cream colored mucous, developed URI about a week ago. Of note she was rx with pred and started on Advair in addition to Spiriva in setting AE several months ago.   ROV 08/01/11 -- moderate COPD, chronic cough, allergies. Returns for f/u. Has been managed with Spiriva + Advair. She was tried on lisinopril but had to change to losartan due to cough. Also started on metformin. Has been doing fairly well. No exacerbations. She uses SABA rarely.   Acute OV 01/29/12  Complains of increased SOB, wheezing, dry cough, chest congestion, head congestion w/ clear drainage, PND w/ sore throat, joint aches x2 days .  OTC not helping with cough .  No fever , chest pain , hemoptysis , leg swelling or n/v/d.  Doing well until last few days. Cough and congestion getting worse for last 2 days.   ROV 02/06/12 -- moderate COPD, chronic cough, allergies. Was seen a week ago by TP for AE, improved. CXR normal. She is much improved. She is on spiriva, SABA prn.   ROV 05/29/12 -- moderate COPD, chronic cough, allergies. She has had some cough, started doing her NSW's. She uses her albuterol most evenings, helps her  clear mucous. Remains on allegra and fluticasone. No flares.   CAT Score 02/06/2012  Total CAT Score 10    Review of Systems As per HPI    Objective:   Physical Exam Filed Vitals:   05/29/12 0904  BP: 116/70  Pulse: 74  Temp: 97.7 F (36.5 C)  TempSrc: Oral  Height: 5' (1.524 m)  Weight: 155 lb 6.4 oz (70.489 kg)  SpO2: 94%   Gen: Pleasant, well-nourished, in no distress,  normal affect  ENT: No lesions,  mouth clear  Neck: No JVD, no TMG, no carotid bruits  Lungs: No use of accessory muscles, few basilar insp crackles. No  wheeze  Cardiovascular: RRR, heart sounds normal, no murmur or gallops, no peripheral edema  Musculoskeletal: No deformities, no cyanosis or clubbing  Neuro: alert, non focal  Skin: Warm, no lesions or rashes    Assessment & Plan:   ALLERGIC RHINITIS Controlled on NSW + allegra + nasal steroid  CHRONIC OBSTRUCTIVE PULMONARY DISEASE, MODERATE Stable on current regimen - spriva + advair + saba prn

## 2012-05-29 NOTE — Assessment & Plan Note (Signed)
Stable on current regimen - spriva + advair + saba prn

## 2012-05-29 NOTE — Patient Instructions (Addendum)
Please continue your medications as you are taking them Follow with Dr Delton Coombes in 4 months or sooner if you have any problems.

## 2012-05-29 NOTE — Assessment & Plan Note (Signed)
Controlled on NSW + allegra + nasal steroid

## 2012-06-28 ENCOUNTER — Other Ambulatory Visit: Payer: Self-pay

## 2012-06-28 DIAGNOSIS — Z1231 Encounter for screening mammogram for malignant neoplasm of breast: Secondary | ICD-10-CM

## 2012-07-09 ENCOUNTER — Ambulatory Visit
Admission: RE | Admit: 2012-07-09 | Discharge: 2012-07-09 | Disposition: A | Discharge status: Home / Self Care | Payer: Medicare Other | Source: Ambulatory Visit

## 2012-07-09 DIAGNOSIS — Z1231 Encounter for screening mammogram for malignant neoplasm of breast: Secondary | ICD-10-CM

## 2012-10-01 ENCOUNTER — Encounter: Payer: Self-pay | Admitting: Emergency Medicine

## 2012-10-01 ENCOUNTER — Ambulatory Visit (INDEPENDENT_AMBULATORY_CARE_PROVIDER_SITE_OTHER): Payer: Medicare Other | Admitting: Emergency Medicine

## 2012-10-01 VITALS — BP 110/70 | HR 75 | Temp 98.3°F | Ht 61.0 in | Wt 158.0 lb

## 2012-10-01 DIAGNOSIS — J309 Allergic rhinitis, unspecified: Secondary | ICD-10-CM

## 2012-10-01 DIAGNOSIS — Z23 Encounter for immunization: Secondary | ICD-10-CM

## 2012-10-01 DIAGNOSIS — J449 Chronic obstructive pulmonary disease, unspecified: Secondary | ICD-10-CM

## 2012-10-01 NOTE — Assessment & Plan Note (Signed)
Flu shot today Please continue your spiriva daily, albuterol 2 puffs as needed Follow with Dr Delton Coombes in 4 months or sooner if you have any problems.

## 2012-10-01 NOTE — Assessment & Plan Note (Signed)
Continue allegra and fluticasone nasal spray as you are using them If your hoarseness and cough start to worsen, go ahead and start nasal saline washes daily You may benefit from over-the-counter decongestant containing chlorpheniramine or brompheniramine. Take as directed

## 2012-10-01 NOTE — Patient Instructions (Addendum)
Flu shot today Please continue your spiriva daily, albuterol 2 puffs as needed Continue allegra and fluticasone nasal spray as you are using them If your hoarseness and cough start to worsen, go ahead and start nasal saline washes daily You may benefit from over-the-counter decongestant containing chlorpheniramine or brompheniramine. Take as directed Follow with Dr Delton Coombes in 4 months or sooner if you have any problems.

## 2012-10-01 NOTE — Progress Notes (Signed)
Subjective:    Patient ID: [name] [name], female    DOB: May 07, 1945, 67 y.o.   MRN: [id number]  HPI Ms. Marchant is a 67 year old with moderate COPD.  ROV 12/01/08 -- No exacerbation since last visit. Has been maintained on Spiriva once daily. Has ventolin available to use as needed, not using it every day. Exertional tolerance is about the same. Uses O2 with some exertion but not all. Wants to know if she can stop using O2 at night. Has had her flu shot.   Acute visit 06/17/09 -- Moderate COPD, presents with about a week of worsening PND, dry cough, chest tightness. Has been using ventolin more often. No sick contacts, no f/c/sweats. Some HA. Has heard wheezing. Not on fexofenadine right now.   ROV 11/23/09 -- COPD. Treated her for a possible flare of her allergies last time with Pred taper. Was better for a while. Now experiencing more dry cough for the last week, rarely productive, doesn't really notice any more nasal drainage. Has been on fexofanadine since last time.   ROV 01/05/10 -- moderate COPD, allergic rhinitis. Last time we held Spiriva x 2 weeks, added fluticasone to fexofenadine. Discussed NSWs, she did daily for a while, now using as needed. Her cough and nasal gtt seem to both be better. She did miss the Spiriva when off of it, breathing back to baseline.    04/08/2010  Sick visit >>Increased DOE, heavy chest, green phlegm - sudden onset x 1 day, no preceding URI, no aggravavting or relieving factor, no change in environment on questioning. Is using oxygen x 1 day continuous  Uses O2 during evenings only  CXR - no infiltrate  ROV 05/10/10 -- COPD, allergic rhinitis. Seen for possible AE end of March, treated for pred + abx, improved. Last couple days more mucous and cough, yellowish phlegm. No sick contacts. No real change in allergy sx - takes allegra and nasal spray reliably. Takes spiriva, stopped it this week to see if it was irritating her throat.   ROV 06/13/10 -- COPD,  chronic cough, allergies. The cough is coming in paroxysms. She is doing [place], Careers adviser, fluticasone spray. She stopped Spiriva and needed to go back on.   ROV 12/29/10 -- moderate COPD, chronic cough, allergies. Has been having more cough, now producing cream colored mucous, developed URI about a week ago. Of note she was rx with pred and started on Advair in addition to Spiriva in setting AE several months ago.   ROV 08/01/11 -- moderate COPD, chronic cough, allergies. Returns for f/u. Has been managed with Spiriva + Advair. She was tried on lisinopril but had to change to losartan due to cough. Also started on metformin. Has been doing fairly well. No exacerbations. She uses SABA rarely.   Acute OV 01/29/12  Complains of increased SOB, wheezing, dry cough, chest congestion, head congestion w/ clear drainage, PND w/ sore throat, joint aches x2 days .  OTC not helping with cough .  No fever , chest pain , hemoptysis , leg swelling or n/v/d.  Doing well until last few days. Cough and congestion getting worse for last 2 days.   ROV 02/06/12 -- moderate COPD, chronic cough, allergies. Was seen a week ago by TP for AE, improved. CXR normal. She is much improved. She is on spiriva, SABA prn.   ROV 05/29/12 -- moderate COPD, chronic cough, allergies. She has had some cough, started doing her NSW's. She uses her albuterol most evenings, helps her  clear mucous. Remains on allegra and fluticasone. No flares.   ROV 10/01/12 -- moderate COPD, chronic cough, allergies. She uses o2 w exertion. She is on spiriva qd, allegra, nasal steroid. Not on NSW right now. No flares, wants flu shot today. Unfortunately her spouse died this month, but she is doing fairly well.   CAT Score 02/06/2012  Total CAT Score 10    Review of Systems As per HPI    Objective:   Physical Exam Filed Vitals:   10/01/12 0956  BP: 110/70  Pulse: 75  Temp: 98.3 F (36.8 C)  TempSrc: Oral  Height: 5\' 1"  (1.549 m)  Weight: 158 lb  (71.668 kg)  SpO2: 95%   Gen: Pleasant, well-nourished, in no distress,  normal affect  ENT: No lesions,  mouth clear  Neck: No JVD, no TMG, no carotid bruits, very mild exp stridor/squeeks  Lungs: No use of accessory muscles, few basilar insp crackles. No wheeze  Cardiovascular: RRR, heart sounds normal, no murmur or gallops, no peripheral edema  Musculoskeletal: No deformities, no cyanosis or clubbing  Neuro: alert, non focal  Skin: Warm, no lesions or rashes    Assessment & Plan:   CHRONIC OBSTRUCTIVE PULMONARY DISEASE, MODERATE Flu shot today Please continue your spiriva daily, albuterol 2 puffs as needed Follow with Dr Delton Coombes in 4 months or sooner if you have any problems.  ALLERGIC RHINITIS Continue allegra and fluticasone nasal spray as you are using them If your hoarseness and cough start to worsen, go ahead and start nasal saline washes daily You may benefit from over-the-counter decongestant containing chlorpheniramine or brompheniramine. Take as directed

## 2013-02-20 ENCOUNTER — Ambulatory Visit: Payer: Medicare Other | Admitting: Emergency Medicine

## 2013-02-26 ENCOUNTER — Ambulatory Visit: Payer: Medicare Other | Admitting: Emergency Medicine

## 2013-03-17 ENCOUNTER — Telehealth: Payer: Self-pay | Admitting: Emergency Medicine

## 2013-03-17 MED ORDER — PREDNISONE 10 MG PO TABS: ORAL_TABLET | ORAL | Status: DC

## 2013-03-17 MED ORDER — AZITHROMYCIN 250 MG PO TABS: ORAL_TABLET | ORAL | Status: AC

## 2013-03-17 NOTE — Telephone Encounter (Signed)
lmtcb x2 

## 2013-03-17 NOTE — Telephone Encounter (Signed)
zpak Prednisone 10 mg take  4 each am x 2 days,   2 each am x 2 days,  1 each am x 2 days and stop To er if worsen

## 2013-03-17 NOTE — Telephone Encounter (Signed)
Pt is aware of MW's recs. Rx's have been called in. Nothing further is needed.

## 2013-03-17 NOTE — Telephone Encounter (Signed)
Pt returning cal having trouble breathingl.[name] GriffinsStanley A Dalton

## 2013-03-17 NOTE — Telephone Encounter (Signed)
lmtcb x1 

## 2013-03-17 NOTE — Telephone Encounter (Signed)
Spoke with pt. She is currently in [place] and her breathing has gotten worse since being there. Reports increased SOB, dry cough and wheezing. Denies chest tightness. Onset was 3 days ago. Would like prednisone and antibiotic called in. [facility] [phone].  MW - please advise. Thanks  *Pt requests to be called back at [phone]*

## 2013-04-08 ENCOUNTER — Ambulatory Visit (INDEPENDENT_AMBULATORY_CARE_PROVIDER_SITE_OTHER): Payer: Medicare Other | Admitting: Emergency Medicine

## 2013-04-08 ENCOUNTER — Encounter: Payer: Self-pay | Admitting: Emergency Medicine

## 2013-04-08 VITALS — BP 132/80 | HR 96 | Ht 63.0 in | Wt 162.0 lb

## 2013-04-08 DIAGNOSIS — J309 Allergic rhinitis, unspecified: Secondary | ICD-10-CM

## 2013-04-08 DIAGNOSIS — J449 Chronic obstructive pulmonary disease, unspecified: Secondary | ICD-10-CM

## 2013-04-08 DIAGNOSIS — J4489 Other specified chronic obstructive pulmonary disease: Secondary | ICD-10-CM

## 2013-04-08 MED ORDER — FLUTICASONE PROPIONATE 50 MCG/ACT NA SUSP: 2.0000 | Freq: Two times a day (BID) | NASAL | Status: DC

## 2013-04-08 NOTE — Progress Notes (Signed)
Subjective:   Patient ID: [name] [name], female    DOB: 08/29/1945, 68 y.o.   MRN: [id number]  HPI [name] is a 68 year old with moderate COPD.  ROV 12/01/08 -- No exacerbation since last visit. Has been maintained on Spiriva once daily. Has ventolin available to use as needed, not using it every day. Exertional tolerance is about the same. Uses O2 with some exertion but not all. Wants to know if she can stop using O2 at night. Has had her flu shot.   Acute visit 06/17/09 -- Moderate COPD, presents with about a week of worsening PND, dry cough, chest tightness. Has been using ventolin more often. No sick contacts, no f/c/sweats. Some HA. Has heard wheezing. Not on fexofenadine right now.   ROV 11/23/09 -- COPD. Treated her for a possible flare of her allergies last time with Pred taper. Was better for a while. Now experiencing more dry cough for the last week, rarely productive, doesn't really notice any more nasal drainage. Has been on fexofanadine since last time.   ROV 01/05/10 -- moderate COPD, allergic rhinitis. Last time we held Spiriva x 2 weeks, added fluticasone to fexofenadine. Discussed NSWs, she did daily for a while, now using as needed. Her cough and nasal gtt seem to both be better. She did miss the Spiriva when off of it, breathing back to baseline.    04/08/2010  Sick visit >>Increased DOE, heavy chest, green phlegm - sudden onset x 1 day, no preceding URI, no aggravavting or relieving factor, no change in environment on questioning. Is using oxygen x 1 day continuous  Uses O2 during evenings only  CXR - no infiltrate  ROV 05/10/10 -- COPD, allergic rhinitis. Seen for possible AE end of March, treated for pred + abx, improved. Last couple days more mucous and cough, yellowish phlegm. No sick contacts. No real change in allergy sx - takes allegra and nasal spray reliably. Takes spiriva, stopped it this week to see if it was irritating her throat.   ROV 06/13/10 -- COPD, chronic  cough, allergies. The cough is coming in paroxysms. She is doing [place], Careers adviserallegra, fluticasone spray. She stopped Spiriva and needed to go back on.   ROV 12/29/10 -- moderate COPD, chronic cough, allergies. Has been having more cough, now producing cream colored mucous, developed URI about a week ago. Of note she was rx with pred and started on Advair in addition to Spiriva in setting AE several months ago.   ROV 08/01/11 -- moderate COPD, chronic cough, allergies. Returns for f/u. Has been managed with Spiriva + Advair. She was tried on lisinopril but had to change to losartan due to cough. Also started on metformin. Has been doing fairly well. No exacerbations. She uses SABA rarely.   Acute OV 01/29/12  Complains of increased SOB, wheezing, dry cough, chest congestion, head congestion w/ clear drainage, PND w/ sore throat, joint aches x2 days .  OTC not helping with cough .  No fever , chest pain , hemoptysis , leg swelling or n/v/d.  Doing well until last few days. Cough and congestion getting worse for last 2 days.   ROV 02/06/12 -- moderate COPD, chronic cough, allergies. Was seen a week ago by TP for AE, improved. CXR normal. She is much improved. She is on spiriva, SABA prn.   ROV 05/29/12 -- moderate COPD, chronic cough, allergies. She has had some cough, started doing her NSW's. She uses her albuterol most evenings, helps her clear  mucous. Remains on allegra and fluticasone. No flares.   ROV 10/01/12 -- moderate COPD, chronic cough, allergies. She uses o2 w exertion. She is on spiriva qd, allegra, nasal steroid. Not on NSW right now. No flares, wants flu shot today. Unfortunately her spouse died this month, but she is doing fairly well.   ROV 04/08/13 -- moderate COPD, chronic cough, allergies. She uses o2 w exertion.  She had an acute flare beginning of the month since last time and was treated with pred and azithro by phone. She got better but still having wheeze, very little cough, non-prod. She  has had nasal gtt. Has been using allegra, NSW, fluticasone qd and sometimes 2x a day. She uses albuterol 2-3x a day to move mucous and cough more effectively. Also using mucinex-DM. She is on Spiriva, Advair prn.   CAT Score 02/06/2012  Total CAT Score 10    Review of Systems As per HPI    Objective:   Physical Exam Filed Vitals:   04/08/13 0929  BP: 132/80  Pulse: 96  Height: 5\' 3"  (1.6 m)  Weight: 162 lb (73.483 kg)  SpO2: 90%   Gen: Pleasant, well-nourished, in no distress,  normal affect  ENT: No lesions,  mouth clear  Neck: No JVD, no TMG, no carotid bruits, very mild exp stridor/squeeks  Lungs: No use of accessory muscles, few basilar insp crackles. No wheeze  Cardiovascular: RRR, heart sounds normal, no murmur or gallops, no peripheral edema  Musculoskeletal: No deformities, no cyanosis or clubbing  Neuro: alert, non focal  Skin: Warm, no lesions or rashes    Assessment & Plan:   ALLERGIC RHINITIS Continue your nasal saline washes daily, allegra daily Increase your fluticasone nasal spray to 2 sprays each side twice a day Use your mucinex-DM as needed Try using OTC antihistamines that contain chlorpheniramine as directed.  Follow with Dr Delton CoombesByrum in 2 months or sooner if you have any problems  CHRONIC OBSTRUCTIVE PULMONARY DISEASE, MODERATE Please continue Spiriva every day Do not take Advair any more Use ProAir (albuterol) 2 puffs if needed for shortness of breath

## 2013-04-08 NOTE — Patient Instructions (Signed)
Please continue Spiriva every day Do not take Advair any more Use ProAir (albuterol) 2 puffs if needed for shortness of breath Continue your nasal saline washes daily, allegra daily Increase your fluticasone nasal spray to 2 sprays each side twice a day Use your mucinex-DM as needed Try using OTC antihistamines that contain chlorpheniramine as directed.  Follow with Dr Delton CoombesByrum in 2 months or sooner if you have any problems.

## 2013-04-08 NOTE — Addendum Note (Signed)
Addended by: Gwynneth AlimentLAWRENCE, Raquel Racey A on: 04/08/2013 10:12 AM   Modules accepted: Orders

## 2013-04-08 NOTE — Assessment & Plan Note (Signed)
Continue your nasal saline washes daily, allegra daily Increase your fluticasone nasal spray to 2 sprays each side twice a day Use your mucinex-DM as needed Try using OTC antihistamines that contain chlorpheniramine as directed.  Follow with Dr Delton CoombesByrum in 2 months or sooner if you have any problems

## 2013-04-08 NOTE — Assessment & Plan Note (Signed)
Please continue Spiriva every day Do not take Advair any more Use ProAir (albuterol) 2 puffs if needed for shortness of breath

## 2013-04-14 ENCOUNTER — Telehealth: Payer: Self-pay | Admitting: Emergency Medicine

## 2013-04-14 MED ORDER — PREDNISONE 10 MG PO TABS: ORAL_TABLET | ORAL | Status: DC

## 2013-04-14 NOTE — Telephone Encounter (Signed)
Per OV 04/08/13: Patient Instructions      Please continue Spiriva every day Do not take Advair any more Use ProAir (albuterol) 2 puffs if needed for shortness of breath Continue your nasal saline washes daily, allegra daily Increase your fluticasone nasal spray to 2 sprays each side twice a day Use your mucinex-DM as needed Try using OTC antihistamines that contain chlorpheniramine as directed.  Follow with Dr Delton CoombesByrum in 2 months or sooner if you have any problems  --  Spoke with pt. She reports she is still feeling very SOB w/ least little activity. She is using rescue inhaler about 6-10 times a day. Still c/o wheezing. She has done everything RB has recommended. She did stop the chlorpheniramine bc it was making her sleep all the time. Wants something called in to help her breathing. Please advise RB thanks  Allergies  Allergen Reactions  . Lisinopril     cough  . Sulfonamide Derivatives

## 2013-04-14 NOTE — Telephone Encounter (Signed)
Spoke with the pt and notified of recs per RB  Pt verbalized understanding  Rx was sent to pharm   

## 2013-04-14 NOTE — Telephone Encounter (Signed)
pred taper: Take 40mg daily for 3 days, then 30mg daily for 3 days, then 20mg daily for 3 days, then 10mg daily for 3 days, then stop  

## 2013-05-30 ENCOUNTER — Telehealth: Payer: Self-pay | Admitting: Emergency Medicine

## 2013-05-30 MED ORDER — PREDNISONE 10 MG PO TABS
ORAL_TABLET | ORAL | Status: DC
Start: 2013-05-30 — End: 2013-10-08

## 2013-05-30 NOTE — Telephone Encounter (Signed)
pred taper OK >  Take 40mg  daily for 3 days, then 30mg  daily for 3 days, then 20mg  daily for 3 days, then 10mg  daily for 3 days, then stop

## 2013-05-30 NOTE — Telephone Encounter (Signed)
Spoke with pt. Aware of recs. RX called in. Nothing further needed 

## 2013-05-30 NOTE — Telephone Encounter (Signed)
lmomtcb x1 

## 2013-05-30 NOTE — Telephone Encounter (Signed)
Spoke with the pt  She c/o increased DOE, wheezing and increased cough x 2 days  Cough is mainly non prod, but occ produces some minimal yellow sputum  She states that she has ov next wk, and declined the appt I offered her today  She states "I will be fine if he just sends in some prednisone" Please advise, thanks! Allergies  Allergen Reactions  . Lisinopril     cough  . Sulfonamide Derivatives

## 2013-06-04 ENCOUNTER — Ambulatory Visit (INDEPENDENT_AMBULATORY_CARE_PROVIDER_SITE_OTHER): Payer: Medicare Other | Admitting: Emergency Medicine

## 2013-06-04 ENCOUNTER — Encounter: Payer: Self-pay | Admitting: Emergency Medicine

## 2013-06-04 VITALS — BP 138/88 | HR 78 | Ht 60.0 in | Wt 163.0 lb

## 2013-06-04 DIAGNOSIS — J449 Chronic obstructive pulmonary disease, unspecified: Secondary | ICD-10-CM

## 2013-06-04 DIAGNOSIS — R05 Cough: Secondary | ICD-10-CM | POA: Insufficient documentation

## 2013-06-04 DIAGNOSIS — R059 Cough, unspecified: Secondary | ICD-10-CM

## 2013-06-04 MED ORDER — OMEPRAZOLE 20 MG PO CPDR: 20.0000 mg | DELAYED_RELEASE_CAPSULE | Freq: Every day | ORAL | Status: DC

## 2013-06-04 MED ORDER — ALBUTEROL SULFATE (2.5 MG/3ML) 0.083% IN NEBU: 2.5000 mg | INHALATION_SOLUTION | RESPIRATORY_TRACT | Status: DC | PRN

## 2013-06-04 MED ORDER — HYDROCODONE-HOMATROPINE 5-1.5 MG/5ML PO SYRP: 5.0000 mL | ORAL_SOLUTION | Freq: Four times a day (QID) | ORAL | Status: DC | PRN

## 2013-06-04 NOTE — Assessment & Plan Note (Signed)
-   continue current BD's - trial of albuterol via nebulizer to see if she prefers  - o2 with exertion - try to control allergies

## 2013-06-04 NOTE — Assessment & Plan Note (Signed)
-   will try adding omeprazole to see if she benefits

## 2013-06-04 NOTE — Patient Instructions (Signed)
Please continue your current medications We will try albuterol nebulized up to every 4 hours if needed for shortness of breath Use hycodan cough syrup 5cc up to every 6 hours if needed for cough Start omeprazole 20mg  daily to see if this helps with your cough Start mucinex-D 600mg  once or twice a day to see if this helps with your cough Wear your oxygen with exertion.  Follow with Dr Delton Coombes in 3 months or sooner if you have any problems.

## 2013-06-04 NOTE — Assessment & Plan Note (Signed)
flonase + allegra + NSW Add hycodan for cough

## 2013-06-04 NOTE — Progress Notes (Signed)
Subjective:   Patient ID: [name] BarrenPatricia A [name], female    DOB: 08/29/1945, 68 y.o.   MRN: [id number]  HPI [name] [name] is a 68 year old with moderate COPD.  ROV 12/01/08 -- No exacerbation since last visit. Has been maintained on Spiriva once daily. Has ventolin available to use as needed, not using it every day. Exertional tolerance is about the same. Uses O2 with some exertion but not all. Wants to know if she can stop using O2 at night. Has had her flu shot.   Acute visit 06/17/09 -- Moderate COPD, presents with about a week of worsening PND, dry cough, chest tightness. Has been using ventolin more often. No sick contacts, no f/c/sweats. Some HA. Has heard wheezing. Not on fexofenadine right now.   ROV 11/23/09 -- COPD. Treated her for a possible flare of her allergies last time with Pred taper. Was better for a while. Now experiencing more dry cough for the last week, rarely productive, doesn't really notice any more nasal drainage. Has been on fexofanadine since last time.   ROV 01/05/10 -- moderate COPD, allergic rhinitis. Last time we held Spiriva x 2 weeks, added fluticasone to fexofenadine. Discussed NSWs, she did daily for a while, now using as needed. Her cough and nasal gtt seem to both be better. She did miss the Spiriva when off of it, breathing back to baseline.    04/08/2010  Sick visit >>Increased DOE, heavy chest, green phlegm - sudden onset x 1 day, no preceding URI, no aggravavting or relieving factor, no change in environment on questioning. Is using oxygen x 1 day continuous  Uses O2 during evenings only  CXR - no infiltrate  ROV 05/10/10 -- COPD, allergic rhinitis. Seen for possible AE end of March, treated for pred + abx, improved. Last couple days more mucous and cough, yellowish phlegm. No sick contacts. No real change in allergy sx - takes allegra and nasal spray reliably. Takes spiriva, stopped it this week to see if it was irritating her throat.   ROV 06/13/10 -- COPD, chronic  cough, allergies. The cough is coming in paroxysms. She is doing [place], Careers adviserallegra, fluticasone spray. She stopped Spiriva and needed to go back on.   ROV 12/29/10 -- moderate COPD, chronic cough, allergies. Has been having more cough, now producing cream colored mucous, developed URI about a week ago. Of note she was rx with pred and started on Advair in addition to Spiriva in setting AE several months ago.   ROV 08/01/11 -- moderate COPD, chronic cough, allergies. Returns for f/u. Has been managed with Spiriva + Advair. She was tried on lisinopril but had to change to losartan due to cough. Also started on metformin. Has been doing fairly well. No exacerbations. She uses SABA rarely.   Acute OV 01/29/12  Complains of increased SOB, wheezing, dry cough, chest congestion, head congestion w/ clear drainage, PND w/ sore throat, joint aches x2 days .  OTC not helping with cough .  No fever , chest pain , hemoptysis , leg swelling or n/v/d.  Doing well until last few days. Cough and congestion getting worse for last 2 days.   ROV 02/06/12 -- moderate COPD, chronic cough, allergies. Was seen a week ago by TP for AE, improved. CXR normal. She is much improved. She is on spiriva, SABA prn.   ROV 05/29/12 -- moderate COPD, chronic cough, allergies. She has had some cough, started doing her NSW's. She uses her albuterol most evenings, helps her clear  mucous. Remains on allegra and fluticasone. No flares.   ROV 10/01/12 -- moderate COPD, chronic cough, allergies. She uses o2 w exertion. She is on spiriva qd, allegra, nasal steroid. Not on NSW right now. No flares, wants flu shot today. Unfortunately her spouse died this month, but she is doing fairly well.   ROV 04/08/13 -- moderate COPD, chronic cough, allergies. She uses o2 w exertion.  She had an acute flare beginning of the month since last time and was treated with pred and azithro by phone. She got better but still having wheeze, very little cough, non-prod. She  has had nasal gtt. Has been using allegra, NSW, fluticasone qd and sometimes 2x a day. She uses albuterol 2-3x a day to move mucous and cough more effectively. Also using mucinex-DM. She is on Spiriva, Advair prn.  ROV 06/04/13 -- moderate COPD, chronic cough, allergies. We have had to treat w pred tapers x 2 in the last 2 months for more dyspnea, some increase in dry cough. She wears o2 with higher exertion. She is using albuterol most nights, usually to treat cough. She is on allegra, flonase. Sometimes NSW. Interested in nebulizer for her albuterol.    CAT Score 02/06/2012  Total CAT Score 10    Review of Systems As per HPI    Objective:   Physical Exam Filed Vitals:   06/04/13 0903  BP: 138/88  Pulse: 78  Height: 5' (1.524 m)  Weight: 163 lb (73.936 kg)  SpO2: 97%   Gen: Pleasant, well-nourished, in no distress,  normal affect  ENT: No lesions,  mouth clear  Neck: No JVD, no TMG, no carotid bruits, very mild exp stridor/squeeks  Lungs: No use of accessory muscles, few basilar insp crackles. No wheeze  Cardiovascular: RRR, heart sounds normal, no murmur or gallops, no peripheral edema  Musculoskeletal: No deformities, no cyanosis or clubbing  Neuro: alert, non focal  Skin: Warm, no lesions or rashes    Assessment & Plan:   CHRONIC OBSTRUCTIVE PULMONARY DISEASE, MODERATE - continue current BD's - trial of albuterol via nebulizer to see if she prefers  - o2 with exertion - try to control allergies  ALLERGIC RHINITIS flonase + allegra + NSW Add hycodan for cough   Cough - will try adding omeprazole to see if she benefits

## 2013-06-06 ENCOUNTER — Ambulatory Visit: Payer: Medicare Other | Admitting: Emergency Medicine

## 2013-07-24 ENCOUNTER — Other Ambulatory Visit: Payer: Self-pay

## 2013-07-24 DIAGNOSIS — Z1231 Encounter for screening mammogram for malignant neoplasm of breast: Secondary | ICD-10-CM

## 2013-08-08 ENCOUNTER — Ambulatory Visit
Admission: RE | Admit: 2013-08-08 | Discharge: 2013-08-08 | Disposition: A | Discharge status: Home / Self Care | Payer: Medicare Other | Source: Ambulatory Visit

## 2013-08-08 DIAGNOSIS — Z1231 Encounter for screening mammogram for malignant neoplasm of breast: Secondary | ICD-10-CM

## 2013-10-08 ENCOUNTER — Encounter: Payer: Self-pay | Admitting: Emergency Medicine

## 2013-10-08 ENCOUNTER — Ambulatory Visit (INDEPENDENT_AMBULATORY_CARE_PROVIDER_SITE_OTHER): Payer: Medicare Other | Admitting: Emergency Medicine

## 2013-10-08 VITALS — BP 142/82 | HR 83 | Temp 97.8°F | Ht 60.0 in | Wt 165.4 lb

## 2013-10-08 DIAGNOSIS — R05 Cough: Secondary | ICD-10-CM

## 2013-10-08 DIAGNOSIS — J309 Allergic rhinitis, unspecified: Secondary | ICD-10-CM

## 2013-10-08 DIAGNOSIS — Z23 Encounter for immunization: Secondary | ICD-10-CM

## 2013-10-08 DIAGNOSIS — J449 Chronic obstructive pulmonary disease, unspecified: Secondary | ICD-10-CM

## 2013-10-08 DIAGNOSIS — R059 Cough, unspecified: Secondary | ICD-10-CM

## 2013-10-08 MED ORDER — ALBUTEROL SULFATE HFA 108 (90 BASE) MCG/ACT IN AERS: 2.0000 | INHALATION_SPRAY | Freq: Four times a day (QID) | RESPIRATORY_TRACT | Status: DC | PRN

## 2013-10-08 NOTE — Patient Instructions (Signed)
Stop symbicort altogether Continue your albuterol 2 puffs as needed for shortness of breath or cough We will give the flu shot today Continue your Allegra daily Restart your fluticasone nose spray and omeprazole when your cough worsens Follow with Dr Delton CoombesByrum in 4 months or sooner if you have any problems.

## 2013-10-08 NOTE — Addendum Note (Signed)
Addended by: York RamGAY, Walda Hertzog on: 10/08/2013 10:41 AM   Modules accepted: Orders

## 2013-10-08 NOTE — Assessment & Plan Note (Signed)
Stable on Allegra. She knows to restart her fluticasone spray when her allergies worsen

## 2013-10-08 NOTE — Progress Notes (Deleted)
   Subjective:    Patient ID: [name] [name], female    DOB: 12/03/1945, 68 y.o.   MRN: [id number]  HPI    Review of Systems  Respiratory: Positive for cough and shortness of breath.        Objective:   Physical Exam        Assessment & Plan:

## 2013-10-08 NOTE — Progress Notes (Signed)
Subjective:   Patient ID: [name] BarrenPatricia A [name], female    DOB: 08/29/1945, 68 y.o.   MRN: [id number]  HPI [name] [name] is a 68 year old with moderate COPD.  ROV 12/01/08 -- No exacerbation since last visit. Has been maintained on Spiriva once daily. Has ventolin available to use as needed, not using it every day. Exertional tolerance is about the same. Uses O2 with some exertion but not all. Wants to know if she can stop using O2 at night. Has had her flu shot.   Acute visit 06/17/09 -- Moderate COPD, presents with about a week of worsening PND, dry cough, chest tightness. Has been using ventolin more often. No sick contacts, no f/c/sweats. Some HA. Has heard wheezing. Not on fexofenadine right now.   ROV 11/23/09 -- COPD. Treated her for a possible flare of her allergies last time with Pred taper. Was better for a while. Now experiencing more dry cough for the last week, rarely productive, doesn't really notice any more nasal drainage. Has been on fexofanadine since last time.   ROV 01/05/10 -- moderate COPD, allergic rhinitis. Last time we held Spiriva x 2 weeks, added fluticasone to fexofenadine. Discussed NSWs, she did daily for a while, now using as needed. Her cough and nasal gtt seem to both be better. She did miss the Spiriva when off of it, breathing back to baseline.    04/08/2010  Sick visit >>Increased DOE, heavy chest, green phlegm - sudden onset x 1 day, no preceding URI, no aggravavting or relieving factor, no change in environment on questioning. Is using oxygen x 1 day continuous  Uses O2 during evenings only  CXR - no infiltrate  ROV 05/10/10 -- COPD, allergic rhinitis. Seen for possible AE end of March, treated for pred + abx, improved. Last couple days more mucous and cough, yellowish phlegm. No sick contacts. No real change in allergy sx - takes allegra and nasal spray reliably. Takes spiriva, stopped it this week to see if it was irritating her throat.   ROV 06/13/10 -- COPD, chronic  cough, allergies. The cough is coming in paroxysms. She is doing [place], Careers adviserallegra, fluticasone spray. She stopped Spiriva and needed to go back on.   ROV 12/29/10 -- moderate COPD, chronic cough, allergies. Has been having more cough, now producing cream colored mucous, developed URI about a week ago. Of note she was rx with pred and started on Advair in addition to Spiriva in setting AE several months ago.   ROV 08/01/11 -- moderate COPD, chronic cough, allergies. Returns for f/u. Has been managed with Spiriva + Advair. She was tried on lisinopril but had to change to losartan due to cough. Also started on metformin. Has been doing fairly well. No exacerbations. She uses SABA rarely.   Acute OV 01/29/12  Complains of increased SOB, wheezing, dry cough, chest congestion, head congestion w/ clear drainage, PND w/ sore throat, joint aches x2 days .  OTC not helping with cough .  No fever , chest pain , hemoptysis , leg swelling or n/v/d.  Doing well until last few days. Cough and congestion getting worse for last 2 days.   ROV 02/06/12 -- moderate COPD, chronic cough, allergies. Was seen a week ago by TP for AE, improved. CXR normal. She is much improved. She is on spiriva, SABA prn.   ROV 05/29/12 -- moderate COPD, chronic cough, allergies. She has had some cough, started doing her NSW's. She uses her albuterol most evenings, helps her clear  mucous. Remains on allegra and fluticasone. No flares.   ROV 10/01/12 -- moderate COPD, chronic cough, allergies. She uses o2 w exertion. She is on spiriva qd, allegra, nasal steroid. Not on NSW right now. No flares, wants flu shot today. Unfortunately her spouse died this month, but she is doing fairly well.   ROV 04/08/13 -- moderate COPD, chronic cough, allergies. She uses o2 w exertion.  She had an acute flare beginning of the month since last time and was treated with pred and azithro by phone. She got better but still having wheeze, very little cough, non-prod. She  has had nasal gtt. Has been using allegra, NSW, fluticasone qd and sometimes 2x a day. She uses albuterol 2-3x a day to move mucous and cough more effectively. Also using mucinex-DM. She is on Spiriva, Advair prn.  ROV 06/04/13 -- moderate COPD, chronic cough, allergies. We have had to treat w pred tapers x 2 in the last 2 months for more dyspnea, some increase in dry cough. She wears o2 with higher exertion. She is using albuterol most nights, usually to treat cough. She is on allegra, flonase. Sometimes NSW. Interested in nebulizer for her albuterol.   ROV 10/08/13 -- follow up for COPD, chronic cough, allergies with frequent exacerbations. She also has chronic hypoxemia with exertion likely due to her COPD and hypoventilation. Last time we added omeprazole to her allergy regimen. She is on allegra, is planning to start fluticasone this Fall. She stopped spiriva and has tolerated this. She was apparently changed to symbicort when her insurance wouldn;t pay for ventolin??? shes albuterol rarely, usually to clear secretions.    CAT Score 02/06/2012  Total CAT Score 10    Review of Systems As per HPI    Objective:   Physical Exam Filed Vitals:   10/08/13 0930  BP: 142/82  Pulse: 83  Temp: 97.8 F (36.6 C)  TempSrc: Oral  Height: 5' (1.524 m)  Weight: 165 lb 6.4 oz (75.025 kg)  SpO2: 92%   Gen: Pleasant, well-nourished, in no distress,  normal affect  ENT: No lesions,  mouth clear  Neck: No JVD, no TMG, no carotid bruits, no stridor, clear voice  Lungs: No use of accessory muscles, No wheeze  Cardiovascular: RRR, heart sounds normal, no murmur or gallops, no peripheral edema  Musculoskeletal: No deformities, no cyanosis or clubbing  Neuro: alert, non focal  Skin: Warm, no lesions or rashes    Assessment & Plan:   ALLERGIC RHINITIS Stable on Allegra. She knows to restart her fluticasone spray when her allergies worsen  Cough She believes she benefited from omeprazole. She  is not currently taking plans to restart if her cough worsens  CHRONIC OBSTRUCTIVE PULMONARY DISEASE, MODERATE Apparently her albuterol was nonformulary and she was changed instead to Symbicort. We will stop the Symbicort and rewrite her albuterol prescription so she can get this based on her formulary. Follow in 4 months

## 2013-10-08 NOTE — Assessment & Plan Note (Signed)
Apparently her albuterol was nonformulary and she was changed instead to Symbicort. We will stop the Symbicort and rewrite her albuterol prescription so she can get this based on her formulary. Follow in 4 months

## 2013-10-08 NOTE — Assessment & Plan Note (Signed)
She believes she benefited from omeprazole. She is not currently taking plans to restart if her cough worsens

## 2013-12-27 ENCOUNTER — Emergency Department (INDEPENDENT_AMBULATORY_CARE_PROVIDER_SITE_OTHER)
Admission: EM | Admit: 2013-12-27 | Discharge: 2013-12-27 | Disposition: A | Discharge status: Home / Self Care | Payer: Medicare Other | Source: Home / Self Care | Attending: Emergency Medicine | Admitting: Emergency Medicine

## 2013-12-27 ENCOUNTER — Encounter (HOSPITAL_COMMUNITY): Payer: Self-pay | Admitting: *Deleted

## 2013-12-27 DIAGNOSIS — J441 Chronic obstructive pulmonary disease with (acute) exacerbation: Secondary | ICD-10-CM

## 2013-12-27 DIAGNOSIS — J069 Acute upper respiratory infection, unspecified: Secondary | ICD-10-CM

## 2013-12-27 MED ORDER — PREDNISONE 50 MG PO TABS
50.0000 mg | ORAL_TABLET | Freq: Every day | ORAL | Status: DC
Start: 2013-12-27 — End: 2014-02-20

## 2013-12-27 MED ORDER — AMOXICILLIN-POT CLAVULANATE 875-125 MG PO TABS: 1.0000 | ORAL_TABLET | Freq: Two times a day (BID) | ORAL | Status: DC

## 2013-12-27 NOTE — ED Provider Notes (Signed)
CSN: [id number]     Arrival date & time 12/27/13  1020 History   None    Chief Complaint  Patient presents with  . Shortness of Breath   (Consider location/radiation/quality/duration/timing/severity/associated sxs/prior Treatment) HPI       68 year old female with history of COPD presents for evaluation of shortness of breath. She has increasing shortness of breath since yesterday. She has a cough for about a week. Usually when she gets this way she gets a steroid and antibiotic and she gets better. She has been using her regular controller medications and her albuterol inhaler as prescribed with temporary relief. She is on home oxygen when necessary, she has been wearing it since yesterday. Cough is productive. No fever. Rates level of discomfort as mild to moderate  Past Medical History  Diagnosis Date  . Depressive disorder, not elsewhere classified   . Thyrotoxicosis without mention of goiter or other cause, without mention of thyrotoxic crisis or storm   . Unspecified essential hypertension   . Other and unspecified hyperlipidemia   . Nephritis and nephropathy, not specified as acute or chronic, with unspecified pathological lesion in kidney   . Allergic rhinitis, cause unspecified   . Obesity, unspecified   . Chronic airway obstruction, not elsewhere classified    History reviewed. No pertinent past surgical history. History reviewed. No pertinent family history. History  Substance Use Topics  . Smoking status: Former Smoker -- 2.00 packs/day for 30 years    Quit date: 01/09/1994  . Smokeless tobacco: Never Used  . Alcohol Use: No   OB History    No data available     Review of Systems  Constitutional: Negative for fever and chills.  HENT: Positive for congestion and sinus pressure. Negative for ear pain and sore throat.   Respiratory: Positive for cough and shortness of breath. Negative for chest tightness.   Cardiovascular: Negative for chest pain.  Gastrointestinal:  Negative for nausea, vomiting and abdominal pain.  All other systems reviewed and are negative.   Allergies  Lisinopril and Sulfonamide derivatives  Home Medications   Prior to Admission medications   Medication Sig Start Date End Date Taking? Authorizing Provider  albuterol (PROAIR HFA) 108 (90 BASE) MCG/ACT inhaler Inhale 2 puffs into the lungs every 6 (six) hours as needed for wheezing or shortness of breath. 10/08/13   Collene Gobble, MD  albuterol (PROVENTIL) (2.5 MG/3ML) 0.083% nebulizer solution Take 3 mLs (2.5 mg total) by nebulization every 4 (four) hours as needed for wheezing or shortness of breath (DX: COPD 496). 06/04/13   Collene Gobble, MD  albuterol (VENTOLIN HFA) 108 (90 BASE) MCG/ACT inhaler Inhale 2 puffs into the lungs every 6 (six) hours as needed. 05/29/12   Collene Gobble, MD  amoxicillin-clavulanate (AUGMENTIN) 875-125 MG per tablet Take 1 tablet by mouth every 12 (twelve) hours. 12/27/13   Liam Graham, PA-C  Ascorbic Acid (VITAMIN C) 1000 MG tablet Take 1,000 mg by mouth daily.      Historical Provider, MD  aspirin 81 MG chewable tablet Chew 81 mg by mouth daily.      Historical Provider, MD  atenolol (TENORMIN) 50 MG tablet Take 50 mg by mouth daily.      Historical Provider, MD  atorvastatin (LIPITOR) 20 MG tablet Take 20 mg by mouth daily.      Historical Provider, MD  Blood Glucose Monitoring Suppl (ONE TOUCH ULTRA MINI) W/DEVICE KIT As directed 05/18/11   Historical Provider, MD  Calcium Carbonate-Vitamin  D (CALTRATE 600+D) 600-400 MG-UNIT per tablet Take 2 tablets by mouth daily.      Historical Provider, MD  citalopram (CELEXA) 40 MG tablet Take 40 mg by mouth daily.     Historical Provider, MD  fexofenadine (ALLEGRA) 60 MG tablet Take 60 mg by mouth daily.      Historical Provider, MD  fluticasone (FLONASE) 50 MCG/ACT nasal spray Place 2 sprays into both nostrils 2 (two) times daily. 04/08/13   Collene Gobble, MD  HYDROcodone-homatropine [facility]) 5-1.5 MG/5ML  syrup Take 5 mLs by mouth every 6 (six) hours as needed for cough. 06/04/13   Collene Gobble, MD  levothyroxine (SYNTHROID, LEVOTHROID) 100 MCG tablet Take 100 mcg by mouth daily.    Historical Provider, MD  losartan (COZAAR) 25 MG tablet Take 25 mg by mouth daily.    Historical Provider, MD  Multiple Vitamins-Minerals (CENTRUM PO) Take 1 tablet by mouth daily.      Historical Provider, MD  Omega-3 Fatty Acids (FISH OIL) 1200 MG CAPS Take 1 capsule by mouth daily.      Historical Provider, MD  omeprazole (PRILOSEC) 20 MG capsule Take 1 capsule (20 mg total) by mouth daily. 06/04/13   Collene Gobble, MD  ONE TOUCH ULTRA TEST test strip As directed 05/18/11   Historical Provider, MD  predniSONE (DELTASONE) 50 MG tablet Take 1 tablet (50 mg total) by mouth daily with breakfast. 12/27/13   Liam Graham, PA-C  tiotropium (SPIRIVA) 18 MCG inhalation capsule Place 18 mcg into inhaler and inhale daily.      Historical Provider, MD  traZODone (DESYREL) 50 MG tablet 1 tablet at bedtime and may repeat dose one time if needed. 02/28/13   Historical Provider, MD  vitamin E 1000 UNIT capsule Take 1,000 Units by mouth daily.      Historical Provider, MD   BP 160/96 mmHg  Pulse 100  Temp(Src) 98.4 F (36.9 C) (Oral)  Resp 20  SpO2 94% Physical Exam  Constitutional: She is oriented to person, place, and time. Vital signs are normal. She appears well-developed and well-nourished. No distress.  HENT:  Head: Normocephalic and atraumatic.  Right Ear: External ear normal.  Left Ear: External ear normal.  Nose: Right sinus exhibits maxillary sinus tenderness and frontal sinus tenderness. Left sinus exhibits maxillary sinus tenderness and frontal sinus tenderness.  Mouth/Throat: Oropharynx is clear and moist. No oropharyngeal exudate.  Neck: Normal range of motion. Neck supple.  Cardiovascular: Normal rate, regular rhythm and normal heart sounds.   Pulmonary/Chest: Effort normal and breath sounds normal. No  respiratory distress. She has no wheezes.  Lymphadenopathy:    She has no cervical adenopathy.  Neurological: She is alert and oriented to person, place, and time. She has normal strength. Coordination normal.  Skin: Skin is warm and dry. No rash noted. She is not diaphoretic.  Psychiatric: She has a normal mood and affect. Judgment normal.  Nursing note and vitals reviewed.   ED Course  ED EKG  Date/Time: 12/27/2013 1:35 PM Performed by: Allena Katz, H Authorized by: Allena Katz, H Comparison: not compared with previous ECG  Rhythm: sinus rhythm Rate: normal QRS axis: normal Conduction: conduction normal ST Segments: ST segments normal T Waves: T waves normal Other: no other findings Clinical impression: normal ECG   (including critical care time) Labs Review Labs Reviewed - No data to display  Imaging Review No results found.   MDM   1. URI (upper respiratory infection)   2. COPD  exacerbation    Mild COPD exacerbation in the setting of URI/sinusitis. Treat with Augmentin and prednisone. Follow-up if no improvement in a couple days. ED if worsening.   Meds ordered this encounter  Medications  . amoxicillin-clavulanate (AUGMENTIN) 875-125 MG per tablet    Sig: Take 1 tablet by mouth every 12 (twelve) hours.    Dispense:  14 tablet    Refill:  0  . predniSONE (DELTASONE) 50 MG tablet    Sig: Take 1 tablet (50 mg total) by mouth daily with breakfast.    Dispense:  5 tablet    Refill:  0       Liam Graham, PA-C 12/27/13 1336

## 2013-12-27 NOTE — Discharge Instructions (Signed)
Chronic Obstructive Pulmonary Disease Exacerbation  Chronic obstructive pulmonary disease (COPD) is a common lung problem. In COPD, the flow of air from the lungs is limited. COPD exacerbations are times that breathing gets worse and you need extra treatment. Without treatment they can be life threatening. If they happen often, your lungs can become more damaged. HOME CARE  Do not smoke.  Avoid tobacco smoke and other things that bother your lungs.  If given, take your antibiotic medicine as told. Finish the medicine even if you start to feel better.  Only take medicines as told by your doctor.  Drink enough fluids to keep your pee (urine) clear or pale yellow (unless your doctor has told you not to).  Use a cool mist machine (vaporizer).  If you use oxygen or a machine that turns liquid medicine into a mist (nebulizer), continue to use them as told.  Keep up with shots (vaccinations) as told by your doctor.  Exercise regularly.  Eat healthy foods.  Keep all doctor visits as told. GET HELP RIGHT AWAY IF:  You are very short of breath and it gets worse.  You have trouble talking.  You have bad chest pain.  You have blood in your spit (sputum).  You have a fever.  You keep throwing up (vomiting).  You feel weak, or you pass out (faint).  You feel confused.  You keep getting worse. MAKE SURE YOU:   Understand these instructions.  Will watch your condition.  Will get help right away if you are not doing well or get worse. Document Released: 12/15/2010 Document Revised: 10/16/2012 Document Reviewed: 08/30/2012 [facility] Patient Information 2015 [place]. This information is not intended to replace advice given to you by your health care provider. Make sure you discuss any questions you have with your health care provider.  Upper Respiratory Infection, Adult An upper respiratory infection (URI) is also sometimes known as the common cold. The upper respiratory  tract includes the nose, sinuses, throat, trachea, and bronchi. Bronchi are the airways leading to the lungs. Most people improve within 1 week, but symptoms can last up to 2 weeks. A residual cough may last even longer.  CAUSES Many different viruses can infect the tissues lining the upper respiratory tract. The tissues become irritated and inflamed and often become very moist. Mucus production is also common. A cold is contagious. You can easily spread the virus to others by oral contact. This includes kissing, sharing a glass, coughing, or sneezing. Touching your mouth or nose and then touching a surface, which is then touched by another person, can also spread the virus. SYMPTOMS  Symptoms typically develop 1 to 3 days after you come in contact with a cold virus. Symptoms vary from person to person. They may include:  Runny nose.  Sneezing.  Nasal congestion.  Sinus irritation.  Sore throat.  Loss of voice (laryngitis).  Cough.  Fatigue.  Muscle aches.  Loss of appetite.  Headache.  Low-grade fever. DIAGNOSIS  You might diagnose your own cold based on familiar symptoms, since most people get a cold 2 to 3 times a year. Your caregiver can confirm this based on your exam. Most importantly, your caregiver can check that your symptoms are not due to another disease such as strep throat, sinusitis, pneumonia, asthma, or epiglottitis. Blood tests, throat tests, and X-rays are not necessary to diagnose a common cold, but they may sometimes be helpful in excluding other more serious diseases. Your caregiver will decide if any  further tests are required. RISKS AND COMPLICATIONS  You may be at risk for a more severe case of the common cold if you smoke cigarettes, have chronic heart disease (such as heart failure) or lung disease (such as asthma), or if you have a weakened immune system. The very young and very old are also at risk for more serious infections. Bacterial sinusitis, middle  ear infections, and bacterial pneumonia can complicate the common cold. The common cold can worsen asthma and chronic obstructive pulmonary disease (COPD). Sometimes, these complications can require emergency medical care and may be life-threatening. PREVENTION  The best way to protect against getting a cold is to practice good hygiene. Avoid oral or hand contact with people with cold symptoms. Wash your hands often if contact occurs. There is no clear evidence that vitamin C, vitamin E, echinacea, or exercise reduces the chance of developing a cold. However, it is always recommended to get plenty of rest and practice good nutrition. TREATMENT  Treatment is directed at relieving symptoms. There is no cure. Antibiotics are not effective, because the infection is caused by a virus, not by bacteria. Treatment may include:  Increased fluid intake. Sports drinks offer valuable electrolytes, sugars, and fluids.  Breathing heated mist or steam (vaporizer or shower).  Eating chicken soup or other clear broths, and maintaining good nutrition.  Getting plenty of rest.  Using gargles or lozenges for comfort.  Controlling fevers with ibuprofen or acetaminophen as directed by your caregiver.  Increasing usage of your inhaler if you have asthma. Zinc gel and zinc lozenges, taken in the first 24 hours of the common cold, can shorten the duration and lessen the severity of symptoms. Pain medicines may help with fever, muscle aches, and throat pain. A variety of non-prescription medicines are available to treat congestion and runny nose. Your caregiver can make recommendations and may suggest nasal or lung inhalers for other symptoms.  HOME CARE INSTRUCTIONS   Only take over-the-counter or prescription medicines for pain, discomfort, or fever as directed by your caregiver.  Use a warm mist humidifier or inhale steam from a shower to increase air moisture. This may keep secretions moist and make it easier to  breathe.  Drink enough water and fluids to keep your urine clear or pale yellow.  Rest as needed.  Return to work when your temperature has returned to normal or as your caregiver advises. You may need to stay home longer to avoid infecting others. You can also use a face mask and careful hand washing to prevent spread of the virus. SEEK MEDICAL CARE IF:   After the first few days, you feel you are getting worse rather than better.  You need your caregiver's advice about medicines to control symptoms.  You develop chills, worsening shortness of breath, or brown or red sputum. These may be signs of pneumonia.  You develop yellow or brown nasal discharge or pain in the face, especially when you bend forward. These may be signs of sinusitis.  You develop a fever, swollen neck glands, pain with swallowing, or white areas in the back of your throat. These may be signs of strep throat. SEEK IMMEDIATE MEDICAL CARE IF:   You have a fever.  You develop severe or persistent headache, ear pain, sinus pain, or chest pain.  You develop wheezing, a prolonged cough, cough up blood, or have a change in your usual mucus (if you have chronic lung disease).  You develop sore muscles or a stiff neck. Document  Released: 06/21/2000 Document Revised: 03/20/2011 Document Reviewed: 04/02/2013 [facility] Patient Information 2015 [place], [place]. This information is not intended to replace advice given to you by your health care provider. Make sure you discuss any questions you have with your health care provider.

## 2013-12-27 NOTE — ED Notes (Signed)
Pt   Reports      She has      Cough         And  Shortness  Of  Breath  Late yest      Uses  o2  At  Home also       Has    Chest     And  Back  Pain       yest           Pt  Has  Had  Sinus  Drainage  And  Congested             Pt         Has      History  Of  Copd

## 2014-02-17 ENCOUNTER — Telehealth: Payer: Self-pay | Admitting: Emergency Medicine

## 2014-02-17 MED ORDER — AZITHROMYCIN 250 MG PO TABS: ORAL_TABLET | ORAL | Status: DC

## 2014-02-17 NOTE — Telephone Encounter (Signed)
Its ok to send it

## 2014-02-17 NOTE — Telephone Encounter (Signed)
Spoke with pt, she is aware of recs.  Med sent in.  Nothing further needed.  

## 2014-02-17 NOTE — Telephone Encounter (Signed)
Spoke with the pt  She c/o increased SOB and cough for the past 2 days  She is coughing up moderate green sputum  Neb helps some  I offered appt and she refused  She has upcoming appt 02/27/14  Allergies  Allergen Reactions  . Lisinopril     cough  . Sulfonamide Derivatives

## 2014-02-17 NOTE — Telephone Encounter (Signed)
Please have her take azithro z-pack  And follow up as planned

## 2014-02-17 NOTE — Telephone Encounter (Signed)
Pt's zpak interacts with her Citalopram with risk of QT prolongation.   RB please advise.

## 2014-02-19 ENCOUNTER — Telehealth: Payer: Self-pay | Admitting: Emergency Medicine

## 2014-02-19 NOTE — Telephone Encounter (Signed)
Called pt and appt scheduled to see MW tomorrow.

## 2014-02-20 ENCOUNTER — Ambulatory Visit (INDEPENDENT_AMBULATORY_CARE_PROVIDER_SITE_OTHER): Payer: Medicare Other | Admitting: Internal Medicine

## 2014-02-20 ENCOUNTER — Encounter: Payer: Self-pay | Admitting: Internal Medicine

## 2014-02-20 VITALS — BP 104/69 | HR 86 | Temp 97.5°F | Ht 60.5 in | Wt 164.0 lb

## 2014-02-20 DIAGNOSIS — J441 Chronic obstructive pulmonary disease with (acute) exacerbation: Secondary | ICD-10-CM

## 2014-02-20 MED ORDER — PREDNISONE 10 MG PO TABS: ORAL_TABLET | ORAL | Status: DC

## 2014-02-20 MED ORDER — HYDROCODONE-HOMATROPINE 5-1.5 MG/5ML PO SYRP
5.0000 mL | ORAL_SOLUTION | Freq: Four times a day (QID) | ORAL | Status: DC | PRN
Start: 2014-02-20 — End: 2014-06-25

## 2014-02-20 NOTE — Assessment & Plan Note (Signed)
Pt has at least mod copd / 02 dep baseline now with active AB in setting of apparent uri with mucus turning clearer on zmak but active wheezing on exam and marked increase rescue rx  rec Prednisone 10 mg take  4 each am x 2 days,   2 each am x 2 days,  1 each am x 2 days and stop  Explained the natural history of uri and why it's necessary in patients at risk to treat GERD aggressively - at least  short term -   to reduce risk of evolving cyclical cough initially  triggered by epithelial injury and a heightened sensitivty to the effects of any upper airway irritants,  most importantly acid - related - then perpetuated by epithelial injury related to the cough itself as the upper airway collapses on itself.  That is, the more sensitive the epithelium becomes once it is damaged by the virus, the more the ensuing irritability> the more the cough, the more the secondary reflux (especially in those prone to reflux) the more the irritation of the sensitive mucosa and so on in a  Classic cyclical pattern.     Each maintenance medication was reviewed in detail including most importantly the difference between maintenance and as needed and under what circumstances the prns are to be used.  Please see instructions for details which were reviewed in writing and the patient given a copy.

## 2014-02-20 NOTE — Patient Instructions (Addendum)
Change the Try prilosec 20mg   Take 30-60 min before first meal of the day and Pepcid ac 20 mg one bedtime until cough is completely gone for at least a week without the need for cough suppression  GERD (REFLUX)  is an extremely common cause of respiratory symptoms just like yours , many times with no obvious heartburn at all.    It can be treated with medication, but also with lifestyle changes including avoidance of late meals, excessive alcohol, smoking cessation, and avoid fatty foods, chocolate, peppermint, colas, red wine, and acidic juices such as orange juice.  NO MINT OR MENTHOL PRODUCTS SO NO COUGH DROPS  USE SUGARLESS CANDY INSTEAD (Jolley ranchers or Stover's or Life Savers) or even ice chips will also do - the key is to swallow to prevent all throat clearing. NO OIL BASED VITAMINS - use powdered substitutes.- hold fish oil until stop coughing    For cough > mucinex dm 1200 mg every 12 hours   If still coughing ok to use the hycodan   Prednisone 10 mg take  4 each am x 2 days,   2 each am x 2 days,  1 each am x 2 days and stop   Finish zithromax   Follow up with Dr Delton CoombesByrum as planned

## 2014-02-20 NOTE — Progress Notes (Signed)
Subjective:   Patient ID: [name] [name], female    DOB: [birth year], 69 y.o.   MRN: [id number]  HPI [name] [name] is a 69 year old with moderate COPD.    ROV 12/01/08 -- No exacerbation since last visit. Has been maintained on Spiriva once daily. Has ventolin available to use as needed, not using it every day. Exertional tolerance is about the same. Uses O2 with some exertion but not all. Wants to know if she can stop using O2 at night. Has had her flu shot.   Acute visit 06/17/09 -- Moderate COPD, presents with about a week of worsening PND, dry cough, chest tightness. Has been using ventolin more often. No sick contacts, no f/c/sweats. Some HA. Has heard wheezing. Not on fexofenadine right now.   ROV 11/23/09 -- COPD. Treated her for a possible flare of her allergies last time with Pred taper. Was better for a while. Now experiencing more dry cough for the last week, rarely productive, doesn't really notice any more nasal drainage. Has been on fexofanadine since last time.   ROV 01/05/10 -- moderate COPD, allergic rhinitis. Last time we held Spiriva x 2 weeks, added fluticasone to fexofenadine. Discussed NSWs, she did daily for a while, now using as needed. Her cough and nasal gtt seem to both be better. She did miss the Spiriva when off of it, breathing back to baseline.    04/08/2010  Sick visit >>Increased DOE, heavy chest, green phlegm - sudden onset x 1 day, no preceding URI, no aggravavting or relieving factor, no change in environment on questioning. Is using oxygen x 1 day continuous  Uses O2 during evenings only  CXR - no infiltrate  ROV 05/10/10 -- COPD, allergic rhinitis. Seen for possible AE end of March, treated for pred + abx, improved. Last couple days more mucous and cough, yellowish phlegm. No sick contacts. No real change in allergy sx - takes allegra and nasal spray reliably. Takes spiriva, stopped it this week to see if it was irritating her throat.   ROV 06/13/10 -- COPD,  chronic cough, allergies. The cough is coming in paroxysms. She is doing [place], Careers adviser, fluticasone spray. She stopped Spiriva and needed to go back on.   ROV 12/29/10 -- moderate COPD, chronic cough, allergies. Has been having more cough, now producing cream colored mucous, developed URI about a week ago. Of note she was rx with pred and started on Advair in addition to Spiriva in setting AE several months ago.   ROV 08/01/11 -- moderate COPD, chronic cough, allergies. Returns for f/u. Has been managed with Spiriva + Advair. She was tried on lisinopril but had to change to losartan due to cough. Also started on metformin. Has been doing fairly well. No exacerbations. She uses SABA rarely.   Acute OV 01/29/12  Complains of increased SOB, wheezing, dry cough, chest congestion, head congestion w/ clear drainage, PND w/ sore throat, joint aches x2 days .  OTC not helping with cough .  No fever , chest pain , hemoptysis , leg swelling or n/v/d.  Doing well until last few days. Cough and congestion getting worse for last 2 days.   ROV 02/06/12 -- moderate COPD, chronic cough, allergies. Was seen a week ago by TP for AE, improved. CXR normal. She is much improved. She is on spiriva, SABA prn.   ROV 05/29/12 -- moderate COPD, chronic cough, allergies. She has had some cough, started doing her NSW's. She uses her albuterol most evenings, helps  her clear mucous. Remains on allegra and fluticasone. No flares.   ROV 10/01/12 -- moderate COPD, chronic cough, allergies. She uses o2 w exertion. She is on spiriva qd, allegra, nasal steroid. Not on NSW right now. No flares, wants flu shot today. Unfortunately her spouse died this month, but she is doing fairly well.   ROV 04/08/13 -- moderate COPD, chronic cough, allergies. She uses o2 w exertion.  She had an acute flare beginning of the month since last time and was treated with pred and azithro by phone. She got better but still having wheeze, very little cough,  non-prod. She has had nasal gtt. Has been using allegra, NSW, fluticasone qd and sometimes 2x a day. She uses albuterol 2-3x a day to move mucous and cough more effectively. Also using mucinex-DM. She is on Spiriva, Advair prn.  ROV 06/04/13 -- moderate COPD, chronic cough, allergies. We have had to treat w pred tapers x 2 in the last 2 months for more dyspnea, some increase in dry cough. She wears o2 with higher exertion. She is using albuterol most nights, usually to treat cough. She is on allegra, flonase. Sometimes NSW. Interested in nebulizer for her albuterol.   ROV 10/08/13 -- follow up for COPD, chronic cough, allergies with frequent exacerbations. She also has chronic hypoxemia with exertion likely due to her COPD and hypoventilation. Last time we added omeprazole to her allergy regimen. She is on allegra, is planning to start fluticasone this Fall. She stopped spiriva and has tolerated this. She was apparently changed to symbicort when her insurance wouldn;t pay for ventolin??? shes albuterol rarely, usually to clear secretions.    02/17/14 She c/o increased SOB and cough for the past 2 days  She is coughing up moderate green sputum  Neb helps some  rec Zpak   02/20/2014 acute ov/[name] [name] re: mod copd with acute exac not better on zpak Chief Complaint  Patient presents with  . Acute Visit    Pt c/o increased cough and SOB - onset 02/15/14- we called in zithromax for her on 02/17/14 and she has 1 dose remaining. She states that since started zithromax her breathing has improved slightly but her cough is unchanged. Cough is occ prod with minimal green sputum- she wonders if this is coming from her sinuses since also has some PND. She is taking neb with albuterol approx 2 x per day and proair approx 6 x per day.   severe cough/ occ green mucus, less vol, less dark on zpak Taking prilosec in pm with severe cough when lies down and all night despite mucinex dm ? Strength  No obvious day to day or  daytime variabilty or assoc  cp or chest tightness, subjective wheeze overt sinus or hb symptoms. No unusual exp hx or h/o childhood pna/ asthma or knowledge of premature birth.  Sleeping ok without nocturnal  or early am exacerbation  of respiratory  c/o's or need for noct saba. Also denies any obvious fluctuation of symptoms with weather or environmental changes or other aggravating or alleviating factors except as outlined above   Current Medications, Allergies, Complete Past Medical History, Past Surgical History, Family History, and Social History were reviewed in [employer] record.  ROS  The following are not active complaints unless bolded sore throat, dysphagia, dental problems, itching, sneezing,  nasal congestion or excess/ purulent secretions, ear ache,   fever, chills, sweats, unintended wt loss, pleuritic or exertional cp, hemoptysis,  orthopnea pnd or leg swelling,  presyncope, palpitations, heartburn, abdominal pain, anorexia, nausea, vomiting, diarrhea  or change in bowel or urinary habits, change in stools or urine, dysuria,hematuria,  rash, arthralgias, visual complaints, headache, numbness weakness or ataxia or problems with walking or coordination,  change in mood/affect or memory.            Objective:   Wt Readings from Last 3 Encounters:  10/08/13 165 lb 6.4 oz (75.025 kg)  06/04/13 163 lb (73.936 kg)  04/08/13 162 lb (73.483 kg)    Vital signs reviewed   HEENT mild turbinate edema.  Full dentures Oropharynx no thrush or excess pnd or cobblestoning.  No JVD or cervical adenopathy. Mild accessory muscle hypertrophy. Trachea midline, nl thryroid. Chest was hyperinflated by percussion with diminished breath sounds and moderate increased exp time with bilateral mid to late exp  wheeze. Hoover sign positive at mid inspiration. Regular rate and rhythm without murmur gallop or rub or increase P2 or edema.  Abd: no hsm, nl excursion. Ext warm without  cyanosis or clubbing.      Assessment & Plan:

## 2014-02-27 ENCOUNTER — Encounter: Payer: Self-pay | Admitting: Emergency Medicine

## 2014-02-27 ENCOUNTER — Ambulatory Visit (INDEPENDENT_AMBULATORY_CARE_PROVIDER_SITE_OTHER): Payer: Medicare Other | Admitting: Emergency Medicine

## 2014-02-27 VITALS — BP 120/74 | HR 84 | Ht 60.5 in | Wt 163.0 lb

## 2014-02-27 DIAGNOSIS — J449 Chronic obstructive pulmonary disease, unspecified: Secondary | ICD-10-CM

## 2014-02-27 NOTE — Assessment & Plan Note (Signed)
-   we will continue her current medications. We discussed different contributors to her cough and her upper airway disease which is likely responsible for most of her symptoms. She will not restart her fish oil for her vitamin E. She will continue to use proton pump inhibitor

## 2014-02-27 NOTE — Patient Instructions (Signed)
Please continue your current medications as you have been taking them  Follow with Dr Byrum in 4 months or sooner if you have any problems.  

## 2014-02-27 NOTE — Progress Notes (Signed)
  Subjective:   Patient ID: Festus BarrenPatricia A [name], female    DOB: 06/28/1945, 69 y.o.   MRN: [id number]  HPI [name] [name] is a 69 year old with moderate COPD.  ROV 04/08/13 -- moderate COPD, chronic cough, allergies. She uses o2 w exertion.  She had an acute flare beginning of the month since last time and was treated with pred and azithro by phone. She got better but still having wheeze, very little cough, non-prod. She has had nasal gtt. Has been using allegra, NSW, fluticasone qd and sometimes 2x a day. She uses albuterol 2-3x a day to move mucous and cough more effectively. Also using mucinex-DM. She is on Spiriva, Advair prn.  ROV 06/04/13 -- moderate COPD, chronic cough, allergies. We have had to treat w pred tapers x 2 in the last 2 months for more dyspnea, some increase in dry cough. She wears o2 with higher exertion. She is using albuterol most nights, usually to treat cough. She is on allegra, flonase. Sometimes NSW. Interested in nebulizer for her albuterol.   ROV 10/08/13 -- follow up for COPD, chronic cough, allergies with frequent exacerbations. She also has chronic hypoxemia with exertion likely due to her COPD and hypoventilation. Last time we added omeprazole to her allergy regimen. She is on allegra, is planning to start fluticasone this Fall. She stopped spiriva and has tolerated this. She was apparently changed to symbicort when her insurance wouldn;t pay for ventolin??? shes albuterol rarely, usually to clear secretions.   02/17/14 She c/o increased SOB and cough for the past 2 days  She is coughing up moderate green sputum  Neb helps some  rec Zpak  ROV 02/27/14 -- follow up for moderate COPD, chronic cough, allergies with frequent exacerbations. Was just treated with azithro + pred on 2/9 > just completed the pred, feels better. She is on spiriva, uses SABA prn, allegra daily.  She also had pred in January. She stopped Vit E and fish oil.                                                       Objective:   Filed Vitals:   02/27/14 1607  BP: 120/74  Pulse: 84  Height: 5' 0.5" (1.537 m)  Weight: 163 lb (73.936 kg)  SpO2: 93%  Gen: Pleasant, obese, in no distress,  normal affect  ENT: No lesions,  mouth clear,  oropharynx clear, no postnasal drip  Neck: No JVD, no TMG, no carotid bruits  Lungs: No use of accessory muscles, clear without rales or rhonchi  Cardiovascular: RRR, heart sounds normal, no murmur or gallops, no peripheral edema  Musculoskeletal: No deformities, no cyanosis or clubbing  Neuro: alert, non focal  Skin: Warm, no lesions or rashes   Assessment & Plan:  COPD (chronic obstructive pulmonary disease) - we will continue her current medications. We discussed different contributors to her cough and her upper airway disease which is likely responsible for most of her symptoms. She will not restart her fish oil for her vitamin E. She will continue to use proton pump inhibitor

## 2014-06-25 ENCOUNTER — Encounter: Payer: Self-pay | Admitting: Emergency Medicine

## 2014-06-25 ENCOUNTER — Ambulatory Visit (INDEPENDENT_AMBULATORY_CARE_PROVIDER_SITE_OTHER): Payer: Medicare Other | Admitting: Emergency Medicine

## 2014-06-25 VITALS — BP 110/72 | HR 86 | Ht 61.5 in | Wt 165.0 lb

## 2014-06-25 DIAGNOSIS — J449 Chronic obstructive pulmonary disease, unspecified: Secondary | ICD-10-CM

## 2014-06-25 DIAGNOSIS — R05 Cough: Secondary | ICD-10-CM | POA: Diagnosis not present

## 2014-06-25 DIAGNOSIS — J309 Allergic rhinitis, unspecified: Secondary | ICD-10-CM | POA: Diagnosis not present

## 2014-06-25 DIAGNOSIS — R059 Cough, unspecified: Secondary | ICD-10-CM

## 2014-06-25 MED ORDER — AZITHROMYCIN 250 MG PO TABS: ORAL_TABLET | ORAL | Status: AC

## 2014-06-25 MED ORDER — FLUTICASONE PROPIONATE 50 MCG/ACT NA SUSP: 2.0000 | Freq: Every day | NASAL | Status: DC

## 2014-06-25 MED ORDER — PREDNISONE 10 MG PO TABS: ORAL_TABLET | ORAL | Status: DC

## 2014-06-25 NOTE — Assessment & Plan Note (Signed)
We will discuss possibly changing her losartan to an alternative agent next visit depending on how you're doing

## 2014-06-25 NOTE — Assessment & Plan Note (Signed)
Continue your Allegra Start fluticasone nasal spray, 2 sprays each nostril once a day Start doing your nasal saline washes every day for the next 7-10 days. Then go back to using them as needed

## 2014-06-25 NOTE — Progress Notes (Signed)
Subjective:   Patient ID: [name] [name], female    DOB: [birth year], 69 y.o.   MRN: [id number]  HPI [name] [name] is a 69 year old with moderate COPD.  ROV 04/08/13 -- moderate COPD, chronic cough, allergies. She uses o2 w exertion.  She had an acute flare beginning of the month since last time and was treated with pred and azithro by phone. She got better but still having wheeze, very little cough, non-prod. She has had nasal gtt. Has been using allegra, NSW, fluticasone qd and sometimes 2x a day. She uses albuterol 2-3x a day to move mucous and cough more effectively. Also using mucinex-DM. She is on Spiriva, Advair prn.  ROV 06/04/13 -- moderate COPD, chronic cough, allergies. We have had to treat w pred tapers x 2 in the last 2 months for more dyspnea, some increase in dry cough. She wears o2 with higher exertion. She is using albuterol most nights, usually to treat cough. She is on allegra, flonase. Sometimes NSW. Interested in nebulizer for her albuterol.   ROV 10/08/13 -- follow up for COPD, chronic cough, allergies with frequent exacerbations. She also has chronic hypoxemia with exertion likely due to her COPD and hypoventilation. Last time we added omeprazole to her allergy regimen. She is on allegra, is planning to start fluticasone this Fall. She stopped spiriva and has tolerated this. She was apparently changed to symbicort when her insurance wouldn;t pay for ventolin??? shes albuterol rarely, usually to clear secretions.   02/17/14 She c/o increased SOB and cough for the past 2 days  She is coughing up moderate green sputum  Neb helps some  rec Zpak  ROV 02/27/14 -- follow up for moderate COPD, chronic cough, allergies with frequent exacerbations. Was just treated with azithro + pred on 2/9 > just completed the pred, feels better. She is on spiriva, uses SABA prn, allegra daily.  She also had pred in January. She stopped Vit E and fish oil.            ROV 06/25/14 -- follow-up visit for  moderate COPD, allergic rhinitis and chronic cough. She has very sensitive upper airway disease. She has had some increased exertional SOB over the last week.  She has been doing her NSW prn. She wears her o2 with any significant exertion. She is having a lot of dry cough, more for last week.             Objective:   Filed Vitals:   06/25/14 1607  BP: 110/72  Pulse: 86  Height: 5' 1.5" (1.562 m)  Weight: 165 lb (74.844 kg)  SpO2: 90%  Gen: Pleasant, obese, in no distress,  normal affect  ENT: No lesions,  mouth clear,  oropharynx clear, no postnasal drip  Neck: No JVD, no TMG, no carotid bruits  Lungs: No use of accessory muscles, clear without rales or rhonchi  Cardiovascular: RRR, heart sounds normal, no murmur or gallops, no peripheral edema  Musculoskeletal: No deformities, no cyanosis or clubbing  Neuro: alert, non focal  Skin: Warm, no lesions or rashes   Assessment & Plan:  COPD (chronic obstructive pulmonary disease) Please continue Spiriva and albuterol as you have been using them Prescriptions for a prednisone taper and azithromycin were given in the event that you need them when you're out of town Follow with Dr Delton Coombes in 4 months or sooner if you have any problems.  Allergic rhinitis Continue your Allegra Start fluticasone nasal spray, 2 sprays each nostril once  a day Start doing your nasal saline washes every day for the next 7-10 days. Then go back to using them as needed    Cough We will discuss possibly changing her losartan to an alternative agent next visit depending on how you're doing

## 2014-06-25 NOTE — Assessment & Plan Note (Signed)
Please continue Spiriva and albuterol as you have been using them Prescriptions for a prednisone taper and azithromycin were given in the event that you need them when you're out of town Follow with Dr Delton Coombes in 4 months or sooner if you have any problems.

## 2014-06-25 NOTE — Patient Instructions (Addendum)
Please continue Spiriva and albuterol as you have been using them Continue your Allegra Start fluticasone nasal spray, 2 sprays each nostril once a day Start doing your nasal saline washes every day for the next 7-10 days. Then go back to using them as needed Prescriptions for a prednisone taper and azithromycin were given in the event that you need them when you're out of town We will discuss possibly changing her losartan to an alternative agent next visit depending on how you're doing Follow with Dr Delton Coombes in 4 months or sooner if you have any problems.

## 2014-08-25 ENCOUNTER — Other Ambulatory Visit: Payer: Self-pay

## 2014-08-25 DIAGNOSIS — Z1231 Encounter for screening mammogram for malignant neoplasm of breast: Secondary | ICD-10-CM

## 2014-08-28 ENCOUNTER — Ambulatory Visit
Admission: RE | Admit: 2014-08-28 | Discharge: 2014-08-28 | Disposition: A | Discharge status: Home / Self Care | Payer: Medicare Other | Source: Ambulatory Visit

## 2014-08-28 DIAGNOSIS — Z1231 Encounter for screening mammogram for malignant neoplasm of breast: Secondary | ICD-10-CM

## 2014-11-06 ENCOUNTER — Ambulatory Visit (INDEPENDENT_AMBULATORY_CARE_PROVIDER_SITE_OTHER): Payer: Medicare Other | Admitting: Emergency Medicine

## 2014-11-06 ENCOUNTER — Encounter: Payer: Self-pay | Admitting: Emergency Medicine

## 2014-11-06 VITALS — BP 108/64 | HR 99 | Ht 60.5 in | Wt 166.0 lb

## 2014-11-06 DIAGNOSIS — J309 Allergic rhinitis, unspecified: Secondary | ICD-10-CM

## 2014-11-06 DIAGNOSIS — J449 Chronic obstructive pulmonary disease, unspecified: Secondary | ICD-10-CM

## 2014-11-06 DIAGNOSIS — J383 Other diseases of vocal cords: Secondary | ICD-10-CM | POA: Diagnosis not present

## 2014-11-06 NOTE — Progress Notes (Signed)
Subjective:   Patient ID: [name] BarrenPatricia A [name], female    DOB: 07/20/1945, 69 y.o.   MRN: [id number]  HPI [name] [name] is a 69 year old with moderate COPD.  ROV 04/08/13 -- moderate COPD, chronic cough, allergies. She uses o2 w exertion.  She had an acute flare beginning of the month since last time and was treated with pred and azithro by phone. She got better but still having wheeze, very little cough, non-prod. She has had nasal gtt. Has been using allegra, NSW, fluticasone qd and sometimes 2x a day. She uses albuterol 2-3x a day to move mucous and cough more effectively. Also using mucinex-DM. She is on Spiriva, Advair prn.  ROV 06/04/13 -- moderate COPD, chronic cough, allergies. We have had to treat w pred tapers x 2 in the last 2 months for more dyspnea, some increase in dry cough. She wears o2 with higher exertion. She is using albuterol most nights, usually to treat cough. She is on allegra, flonase. Sometimes NSW. Interested in nebulizer for her albuterol.   ROV 10/08/13 -- follow up for COPD, chronic cough, allergies with frequent exacerbations. She also has chronic hypoxemia with exertion likely due to her COPD and hypoventilation. Last time we added omeprazole to her allergy regimen. She is on allegra, is planning to start fluticasone this Fall. She stopped spiriva and has tolerated this. She was apparently changed to symbicort when her insurance wouldn;t pay for ventolin??? shes albuterol rarely, usually to clear secretions.   02/17/14 She c/o increased SOB and cough for the past 2 days  She is coughing up moderate green sputum  Neb helps some  rec Zpak  ROV 02/27/14 -- follow up for moderate COPD, chronic cough, allergies with frequent exacerbations. Was just treated with azithro + pred on 2/9 > just completed the pred, feels better. She is on spiriva, uses SABA prn, allegra daily.  She also had pred in January. She stopped Vit E and fish oil.            ROV 06/25/14 -- follow-up visit for  moderate COPD, allergic rhinitis and chronic cough. She has very sensitive upper airway disease. She has had some increased exertional SOB over the last week.  She has been doing her NSW prn. She wears her o2 with any significant exertion. She is having a lot of dry cough, more for last week.          ROV 11/06/14 -- follow-up visit for chronic cough / upper airway irritation syndrome, allergic rhinitis and moderate COPD. She has been fairly reliable with her nasal saline washes, flonase, allegra.  She is using albuterol a few times a day mainly for secretion clearance.       Objective:   Filed Vitals:   11/06/14 1014  BP: 108/64  Pulse: 99  Height: 5' 0.5" (1.537 m)  Weight: 166 lb (75.297 kg)  SpO2: 90%  Gen: Pleasant, obese, in no distress,  normal affect  ENT: No lesions,  mouth clear,  oropharynx clear, no postnasal drip  Neck: No JVD, no TMG, no carotid bruits  Lungs: No use of accessory muscles, clear without rales or rhonchi  Cardiovascular: RRR, heart sounds normal, no murmur or gallops, no peripheral edema  Musculoskeletal: No deformities, no cyanosis or clubbing  Neuro: alert, non focal  Skin: Warm, no lesions or rashes   Assessment & Plan:  Vocal cord dysfunction Stable at this time on current regimen. Continue aggressive allergy routine including nasal saline, to take  his own nasal spray, Allegra. I have asked her to start using her nasal saline washes every day instead of when necessary during the fall and spring months.   COPD (chronic obstructive pulmonary disease) Continue Spiriva and albuterol when necessary Flu shot up-to-date  Allergic rhinitis Nasal saline wash, Allegra, fluticasone

## 2014-11-06 NOTE — Assessment & Plan Note (Signed)
Stable at this time on current regimen. Continue aggressive allergy routine including nasal saline, to take his own nasal spray, Allegra. I have asked her to start using her nasal saline washes every day instead of when necessary during the fall and spring months.

## 2014-11-06 NOTE — Assessment & Plan Note (Signed)
Continue Spiriva and albuterol when necessary Flu shot up-to-date

## 2014-11-06 NOTE — Assessment & Plan Note (Signed)
Nasal saline wash, Allegra, fluticasone

## 2014-11-06 NOTE — Patient Instructions (Signed)
Please continue your medications as you are taking them  We will follow up in 4 months or sooner if you have any problems.

## 2015-03-09 ENCOUNTER — Encounter: Payer: Self-pay | Admitting: Emergency Medicine

## 2015-03-09 ENCOUNTER — Ambulatory Visit (INDEPENDENT_AMBULATORY_CARE_PROVIDER_SITE_OTHER): Payer: Medicare Other | Admitting: Emergency Medicine

## 2015-03-09 VITALS — BP 100/60 | HR 78 | Ht 60.5 in | Wt 162.0 lb

## 2015-03-09 DIAGNOSIS — J309 Allergic rhinitis, unspecified: Secondary | ICD-10-CM | POA: Diagnosis not present

## 2015-03-09 DIAGNOSIS — J449 Chronic obstructive pulmonary disease, unspecified: Secondary | ICD-10-CM | POA: Diagnosis not present

## 2015-03-09 DIAGNOSIS — J383 Other diseases of vocal cords: Secondary | ICD-10-CM | POA: Diagnosis not present

## 2015-03-09 MED ORDER — PREDNISONE 10 MG PO TABS: ORAL_TABLET | ORAL | Status: DC

## 2015-03-09 NOTE — Patient Instructions (Addendum)
Please take prednisone as directed Continue nasal saline washes, fluticasone nasal spray, Allegra as you have been taking them Continue Spiriva daily Continue to have albuterol available to use as needed for shortness of breath Continue use Delsym and other over-the-counter symptom based relief as needed Follow with Dr Delton Coombes in 3 months or sooner if you have any problems.

## 2015-03-09 NOTE — Assessment & Plan Note (Signed)
Currently exacerbated with continued cough, thick secretions that she has difficulty clearing. I suspect that she has a flare of her upper airway disease following a recent URI. She will need to be treated with a short prednisone taper to clear this.

## 2015-03-09 NOTE — Assessment & Plan Note (Signed)
No evidence of an acute exacerbation at this time. No wheezing on exam. She is having cough but no purulent secretions. I do not believe she needs an antibiotic. We will continue Spiriva and albuterol as needed.

## 2015-03-09 NOTE — Addendum Note (Signed)
Addended by: Jaynee Eagles C on: 03/09/2015 09:48 AM   Modules accepted: Orders

## 2015-03-09 NOTE — Progress Notes (Signed)
Subjective:   Patient ID: [name] [name], female    DOB: 11/16/1945, 70 y.o.   MRN: [id number]  HPI Ms. Kehres is a 70 year old with moderate COPD.  ROV 04/08/13 -- moderate COPD, chronic cough, allergies. She uses o2 w exertion.  She had an acute flare beginning of the month since last time and was treated with pred and azithro by phone. She got better but still having wheeze, very little cough, non-prod. She has had nasal gtt. Has been using allegra, NSW, fluticasone qd and sometimes 2x a day. She uses albuterol 2-3x a day to move mucous and cough more effectively. Also using mucinex-DM. She is on Spiriva, Advair prn.  ROV 06/04/13 -- moderate COPD, chronic cough, allergies. We have had to treat w pred tapers x 2 in the last 2 months for more dyspnea, some increase in dry cough. She wears o2 with higher exertion. She is using albuterol most nights, usually to treat cough. She is on allegra, flonase. Sometimes NSW. Interested in nebulizer for her albuterol.   ROV 10/08/13 -- follow up for COPD, chronic cough, allergies with frequent exacerbations. She also has chronic hypoxemia with exertion likely due to her COPD and hypoventilation. Last time we added omeprazole to her allergy regimen. She is on allegra, is planning to start fluticasone this Fall. She stopped spiriva and has tolerated this. She was apparently changed to symbicort when her insurance wouldn;t pay for ventolin??? shes albuterol rarely, usually to clear secretions.   02/17/14 She c/o increased SOB and cough for the past 2 days  She is coughing up moderate green sputum  Neb helps some  rec Zpak  ROV 02/27/14 -- follow up for moderate COPD, chronic cough, allergies with frequent exacerbations. Was just treated with azithro + pred on 2/9 > just completed the pred, feels better. She is on spiriva, uses SABA prn, allegra daily.  She also had pred in January. She stopped Vit E and fish oil.            ROV 06/25/14 -- follow-up visit for  moderate COPD, allergic rhinitis and chronic cough. She has very sensitive upper airway disease. She has had some increased exertional SOB over the last week.  She has been doing her NSW prn. She wears her o2 with any significant exertion. She is having a lot of dry cough, more for last week.          ROV 11/06/14 -- follow-up visit for chronic cough / upper airway irritation syndrome, allergic rhinitis and moderate COPD. She has been fairly reliable with her nasal saline washes, flonase, allegra.  She is using albuterol a few times a day mainly for secretion clearance.    ROV 03/09/15 -- follow-up visit for chronic cough with severe upper airway irritation syndrome, allergic rhinitis, moderate COPD.  She had URI sx beginning 2 weeks ago, was seen by PCP and was treated with azithro, cough syrup.  She has reliably done NSW, allegra, nasal steroid.  She has taken some albuterol nebs > helped w dyspnea, thick mucous.       Objective:   Filed Vitals:   03/09/15 0924  BP: 100/60  Pulse: 78  Height: 5' 0.5" (1.537 m)  Weight: 73.483 kg (162 lb)  SpO2: 90%  Gen: Pleasant, obese, in no distress,  normal affect  ENT: No lesions,  mouth clear,  oropharynx clear, no postnasal drip  Neck: No JVD, no TMG, no carotid bruits  Lungs: No use of accessory muscles,  clear without rales or rhonchi  Cardiovascular: RRR, heart sounds normal, no murmur or gallops, no peripheral edema  Musculoskeletal: No deformities, no cyanosis or clubbing  Neuro: alert, non focal  Skin: Warm, no lesions or rashes   Assessment & Plan:  COPD (chronic obstructive pulmonary disease) No evidence of an acute exacerbation at this time. No wheezing on exam. She is having cough but no purulent secretions. I do not believe she needs an antibiotic. We will continue Spiriva and albuterol as needed.  Allergic rhinitis She is coming into the spring allergy season and understands the importance of maintaining her allergy routine  that includes Allegra, nasal saline washes, and fluticasone nasal spray. Suspects that pollen exposure is at least part of her current syndrome  Vocal cord dysfunction Currently exacerbated with continued cough, thick secretions that she has difficulty clearing. I suspect that she has a flare of her upper airway disease following a recent URI. She will need to be treated with a short prednisone taper to clear this.      [name], MD, PhD 03/09/2015, 9:45 AM [facility] [phone] or if no answer [phone]

## 2015-03-09 NOTE — Assessment & Plan Note (Signed)
She is coming into the spring allergy season and understands the importance of maintaining her allergy routine that includes Allegra, nasal saline washes, and fluticasone nasal spray. Suspects that pollen exposure is at least part of her current syndrome

## 2015-03-09 NOTE — Addendum Note (Signed)
Addended by: Karalee Height on: 03/09/2015 09:49 AM   Modules accepted: Orders

## 2015-06-08 ENCOUNTER — Encounter: Payer: Self-pay | Admitting: Emergency Medicine

## 2015-06-08 ENCOUNTER — Ambulatory Visit (INDEPENDENT_AMBULATORY_CARE_PROVIDER_SITE_OTHER): Payer: Medicare Other | Admitting: Emergency Medicine

## 2015-06-08 VITALS — BP 108/66 | HR 82 | Ht 60.05 in | Wt 163.0 lb

## 2015-06-08 DIAGNOSIS — J449 Chronic obstructive pulmonary disease, unspecified: Secondary | ICD-10-CM

## 2015-06-08 DIAGNOSIS — J309 Allergic rhinitis, unspecified: Secondary | ICD-10-CM | POA: Diagnosis not present

## 2015-06-08 MED ORDER — PREDNISONE 10 MG PO TABS: ORAL_TABLET | ORAL | Status: DC

## 2015-06-08 NOTE — Patient Instructions (Addendum)
Continue your good allergy regimen - allegra, nasal washes, fluticasone nasal spray.  Consider taking chlorpheniramine 4mg  as needed for additional allergy symptoms.  Consider using decongestants that contain phenylephrine rarely as needed for congestion.  Continue your spiriva and proair as you haven been using them .  Paper script for prednisone taper for you to take to [place] please call our office to let us know if you need to start this.  Follow with Dr Delton CoombesByrum in 4 months or sooner if you have any problems.

## 2015-06-08 NOTE — Assessment & Plan Note (Signed)
Continue your spiriva and proair as you haven been using them  Paper script for prednisone taper for you to take to [place] please call our office to let us know if you need to start this.  Follow with Dr Delton CoombesByrum in 4 months or sooner if you have any problems.

## 2015-06-08 NOTE — Progress Notes (Signed)
Subjective:   Patient ID: Festus BarrenPatricia A [name] [name], female    DOB: 05/11/1945, 70 y.o.   MRN: [id number]  HPI Ms. [name] [name] [name] is a 70 year old with moderate COPD.  ROV 04/08/13 -- moderate COPD, chronic cough, allergies. She uses o2 w exertion.  She had an acute flare beginning of the month since last time and was treated with pred and azithro by phone. She got better but still having wheeze, very little cough, non-prod. She has had nasal gtt. Has been using allegra, NSW, fluticasone qd and sometimes 2x a day. She uses albuterol 2-3x a day to move mucous and cough more effectively. Also using mucinex-DM. She is on Spiriva, Advair prn.  ROV 06/04/13 -- moderate COPD, chronic cough, allergies. We have had to treat w pred tapers x 2 in the last 2 months for more dyspnea, some increase in dry cough. She wears o2 with higher exertion. She is using albuterol most nights, usually to treat cough. She is on allegra, flonase. Sometimes NSW. Interested in nebulizer for her albuterol.   ROV 10/08/13 -- follow up for COPD, chronic cough, allergies with frequent exacerbations. She also has chronic hypoxemia with exertion likely due to her COPD and hypoventilation. Last time we added omeprazole to her allergy regimen. She is on allegra, is planning to start fluticasone this Fall. She stopped spiriva and has tolerated this. She was apparently changed to symbicort when her insurance wouldn;t pay for ventolin??? shes albuterol rarely, usually to clear secretions.   02/17/14 She c/o increased SOB and cough for the past 2 days  She is coughing up moderate green sputum  Neb helps some  rec Zpak  ROV 02/27/14 -- follow up for moderate COPD, chronic cough, allergies with frequent exacerbations. Was just treated with azithro + pred on 2/9 > just completed the pred, feels better. She is on spiriva, uses SABA prn, allegra daily.  She also had pred in January. She stopped Vit E and fish oil.            ROV 06/25/14 -- follow-up visit for  moderate COPD, allergic rhinitis and chronic cough. She has very sensitive upper airway disease. She has had some increased exertional SOB over the last week.  She has been doing her NSW prn. She wears her o2 with any significant exertion. She is having a lot of dry cough, more for last week.          ROV 11/06/14 -- follow-up visit for chronic cough / upper airway irritation syndrome, allergic rhinitis and moderate COPD. She has been fairly reliable with her nasal saline washes, flonase, allegra.  She is using albuterol a few times a day mainly for secretion clearance.    ROV 03/09/15 -- follow-up visit for chronic cough with severe upper airway irritation syndrome, allergic rhinitis, moderate COPD.  She had URI sx beginning 2 weeks ago, was seen by PCP and was treated with azithro, cough syrup.  She has reliably [name] [name] NSW, allegra, nasal steroid.  She has taken some albuterol nebs > helped w dyspnea, thick mucous.   ROV 06/08/15 -- patient has a history of moderate obstructive lung disease with superimposed severe upper airway irritation syndrome, allergic rhinitis and chronic cough. She required treatment with prednisone at the end of February. She describes some exertional SOB. She has use nasal saline this week, has some ear fullness on the R. On spiriva daily. Uses albuterol about every few days.       Objective:   Filed Vitals:  06/08/15 1124  BP: 108/66  Pulse: 82  Height: 5' 0.05" (1.525 m)  Weight: 163 lb (73.936 kg)  SpO2: 90%  Gen: Pleasant, obese, in no distress,  normal affect  ENT: No lesions,  mouth clear,  oropharynx clear, no postnasal drip  Neck: No JVD, no TMG, no carotid bruits  Lungs: No use of accessory muscles, clear without rales or rhonchi  Cardiovascular: RRR, heart sounds normal, no murmur or gallops, no peripheral edema  Musculoskeletal: No deformities, no cyanosis or clubbing  Neuro: alert, non focal  Skin: Warm, no lesions or rashes   Assessment &  Plan:  Allergic rhinitis Continue your good allergy regimen - allegra, nasal washes, fluticasone nasal spray.  Consider taking chlorpheniramine  as needed for additional allergy symptoms.  Consider using decongestants that contain phenylephrine rarely as needed for congestion.   COPD (chronic obstructive pulmonary disease) Continue your spiriva and proair as you haven been using them  Paper script for prednisone taper for you to take to [place] please call our office to let us know if you need to start this.  Follow with Dr Delton Coombes in 4 months or sooner if you have any problems.     [name] Pupa, MD, PhD 06/08/2015, 11:56 AM [facility] [phone] or if no answer [phone]

## 2015-06-08 NOTE — Assessment & Plan Note (Signed)
Continue your good allergy regimen - allegra, nasal washes, fluticasone nasal spray.  Consider taking chlorpheniramine 4mg  as needed for additional allergy symptoms.  Consider using decongestants that contain phenylephrine rarely as needed for congestion.

## 2015-06-14 ENCOUNTER — Other Ambulatory Visit: Payer: Self-pay | Admitting: Emergency Medicine

## 2015-09-14 ENCOUNTER — Other Ambulatory Visit: Payer: Self-pay | Admitting: Family Medicine

## 2015-09-14 DIAGNOSIS — Z1231 Encounter for screening mammogram for malignant neoplasm of breast: Secondary | ICD-10-CM

## 2015-09-22 ENCOUNTER — Ambulatory Visit
Admission: RE | Admit: 2015-09-22 | Discharge: 2015-09-22 | Disposition: A | Discharge status: Home / Self Care | Payer: Medicare Other | Source: Ambulatory Visit | Attending: Family Medicine | Admitting: Family Medicine

## 2015-09-22 DIAGNOSIS — Z1231 Encounter for screening mammogram for malignant neoplasm of breast: Secondary | ICD-10-CM

## 2015-10-21 ENCOUNTER — Ambulatory Visit: Payer: Medicare Other | Admitting: Emergency Medicine

## 2015-11-15 ENCOUNTER — Encounter: Payer: Self-pay | Admitting: Emergency Medicine

## 2015-11-15 ENCOUNTER — Ambulatory Visit (INDEPENDENT_AMBULATORY_CARE_PROVIDER_SITE_OTHER): Payer: Medicare Other | Admitting: Emergency Medicine

## 2015-11-15 DIAGNOSIS — J301 Allergic rhinitis due to pollen: Secondary | ICD-10-CM

## 2015-11-15 DIAGNOSIS — J449 Chronic obstructive pulmonary disease, unspecified: Secondary | ICD-10-CM

## 2015-11-15 NOTE — Patient Instructions (Signed)
Please continue your Spiriva for now.  Call our office next month when your Spiriva is running out to see if we have any samples of Anoro. I would like to do a trial of Anoro one puff once a day to see if you benefit.  Continue  your other medications as you have been taking them .  Follow with Dr Delton CoombesByrum in 3 months or sooner if you have any problems.

## 2015-11-15 NOTE — Progress Notes (Signed)
Subjective:   Patient ID: [name] BarrenPatricia A [name], female    DOB: 05/20/1945, 70 y.o.   MRN: [id number]  HPI [name] [name] is a 70 year old with moderate COPD.  ROV 04/08/13 -- moderate COPD, chronic cough, allergies. She uses o2 w exertion.  She had an acute flare beginning of the month since last time and was treated with pred and azithro by phone. She got better but still having wheeze, very little cough, non-prod. She has had nasal gtt. Has been using allegra, NSW, fluticasone qd and sometimes 2x a day. She uses albuterol 2-3x a day to move mucous and cough more effectively. Also using mucinex-DM. She is on Spiriva, Advair prn.  ROV 06/04/13 -- moderate COPD, chronic cough, allergies. We have had to treat w pred tapers x 2 in the last 2 months for more dyspnea, some increase in dry cough. She wears o2 with higher exertion. She is using albuterol most nights, usually to treat cough. She is on allegra, flonase. Sometimes NSW. Interested in nebulizer for her albuterol.   ROV 10/08/13 -- follow up for COPD, chronic cough, allergies with frequent exacerbations. She also has chronic hypoxemia with exertion likely due to her COPD and hypoventilation. Last time we added omeprazole to her allergy regimen. She is on allegra, is planning to start fluticasone this Fall. She stopped spiriva and has tolerated this. She was apparently changed to symbicort when her insurance wouldn;t pay for ventolin??? shes albuterol rarely, usually to clear secretions.   02/17/14 She c/o increased SOB and cough for the past 2 days  She is coughing up moderate green sputum  Neb helps some  rec Zpak  ROV 02/27/14 -- follow up for moderate COPD, chronic cough, allergies with frequent exacerbations. Was just treated with azithro + pred on 2/9 > just completed the pred, feels better. She is on spiriva, uses SABA prn, allegra daily.  She also had pred in January. She stopped Vit E and fish oil.            ROV 06/25/14 -- follow-up visit for  moderate COPD, allergic rhinitis and chronic cough. She has very sensitive upper airway disease. She has had some increased exertional SOB over the last week.  She has been doing her NSW prn. She wears her o2 with any significant exertion. She is having a lot of dry cough, more for last week.          ROV 11/06/14 -- follow-up visit for chronic cough / upper airway irritation syndrome, allergic rhinitis and moderate COPD. She has been fairly reliable with her nasal saline washes, flonase, allegra.  She is using albuterol a few times a day mainly for secretion clearance.    ROV 03/09/15 -- follow-up visit for chronic cough with severe upper airway irritation syndrome, allergic rhinitis, moderate COPD.  She had URI sx beginning 2 weeks ago, was seen by PCP and was treated with azithro, cough syrup.  She has reliably done NSW, allegra, nasal steroid.  She has taken some albuterol nebs > helped w dyspnea, thick mucous.   ROV 06/08/15 -- patient has a history of moderate obstructive lung disease with superimposed severe upper airway irritation syndrome, allergic rhinitis and chronic cough. She required treatment with prednisone at the end of February. She describes some exertional SOB. She has use nasal saline this week, has some ear fullness on the R. On spiriva daily. Uses albuterol about every few days.   ROV 11/15/15 -- this follow-up visit for severe upper  airway irritation, vocal cord dysfunction, chronic cough, all superimposed on moderate obstructive lung disease. She has been managed on Spiriva and albuterol when necessary. She also uses an allergy regimen that consists of nasal wash, nasal steroids, allegra. She has noticed some increase in cough, non-productive. She has been dyspneic as well. Has used her albuterol nebulizer a bit more frequently over last several days. Some pain between her shoulder blades       Objective:   Vitals:   11/15/15 1515  BP: 102/62  Pulse: 92  SpO2: 90%  Weight:  170 lb (77.1 kg)  Height: 5' 0.5" (1.537 m)  Gen: Pleasant, obese, in no distress,  normal affect  ENT: No lesions,  mouth clear,  oropharynx clear, no postnasal drip  Neck: No JVD, no TMG, no carotid bruits  Lungs: No use of accessory muscles, few scattered end-exp wheezes.   Cardiovascular: RRR, heart sounds normal, no murmur or gallops, no peripheral edema  Musculoskeletal: No deformities, no cyanosis or clubbing  Neuro: alert, non focal  Skin: Warm, no lesions or rashes   Assessment & Plan:  COPD (chronic obstructive pulmonary disease) Please continue your Spiriva for now.  Call our office next month when your Spiriva is running out to see if we have any samples of Anoro. I would like to do a trial of Anoro one puff once a day to see if you benefit.  Continue  your other medications as you have been taking them .  Follow with Dr Delton CoombesByrum in 3 months or sooner if you have any problems.  Allergic rhinitis Continue current allergy rhinitis regimen    Levy Pupaobert Aujanae Mccullum, MD, PhD 11/15/2015, 3:50 PM [facility] [phone] or if no answer [phone]

## 2015-11-15 NOTE — Assessment & Plan Note (Signed)
Continue current allergy rhinitis regimen

## 2015-11-15 NOTE — Assessment & Plan Note (Signed)
Please continue your Spiriva for now.  Call our office next month when your Spiriva is running out to see if we have any samples of Anoro. I would like to do a trial of Anoro one puff once a day to see if you benefit.  Continue  your other medications as you have been taking them .  Follow with Dr Tawnee Clegg in 3 months or sooner if you have any problems.   

## 2015-12-20 ENCOUNTER — Telehealth: Payer: Self-pay | Admitting: Emergency Medicine

## 2015-12-20 MED ORDER — UMECLIDINIUM-VILANTEROL 62.5-25 MCG/INH IN AEPB: 1.0000 | INHALATION_SPRAY | Freq: Every day | RESPIRATORY_TRACT | 0 refills | Status: DC

## 2015-12-20 NOTE — Telephone Encounter (Signed)
Spoke with pt, advised that anoro samples have been left up front for pickup.  Nothing further needed.

## 2016-02-28 ENCOUNTER — Ambulatory Visit: Payer: Medicare Other | Admitting: Emergency Medicine

## 2016-03-10 ENCOUNTER — Telehealth: Payer: Self-pay | Admitting: Emergency Medicine

## 2016-03-10 MED ORDER — PREDNISONE 10 MG PO TABS: ORAL_TABLET | ORAL | 0 refills | Status: DC

## 2016-03-10 NOTE — Telephone Encounter (Signed)
lmtcb X1 

## 2016-03-10 NOTE — Telephone Encounter (Signed)
Spoke with pt. States that she feels like she is having a flare up. Reports increased SOB, cough, wheezing and chest congestion. Cough is non productive. Denies chest tightness or fever. Symptoms started a few days ago. Pt would like to have prednisone rx sent in.  RB - please advise. Thanks.

## 2016-03-10 NOTE — Telephone Encounter (Signed)
Spoke with patient-she is aware to keep OV coming up and pred Rx sent to [facility]. Nothing more needed at this time.

## 2016-03-10 NOTE — Telephone Encounter (Signed)
Attempted to call pt. Line rang busy x2. Will try back. 

## 2016-03-10 NOTE — Telephone Encounter (Signed)
Patient is returning phone call.  °

## 2016-03-10 NOTE — Telephone Encounter (Signed)
Ok to send. She needs to see us next week if no better  Take 40mg  daily for 3 days, then 30mg  daily for 3 days, then 20mg  daily for 3 days, then 10mg  daily for 3 days, then stop

## 2016-03-17 ENCOUNTER — Encounter: Payer: Self-pay | Admitting: Emergency Medicine

## 2016-03-17 ENCOUNTER — Ambulatory Visit (INDEPENDENT_AMBULATORY_CARE_PROVIDER_SITE_OTHER): Payer: Medicare Other | Admitting: Emergency Medicine

## 2016-03-17 DIAGNOSIS — J383 Other diseases of vocal cords: Secondary | ICD-10-CM | POA: Diagnosis not present

## 2016-03-17 DIAGNOSIS — J449 Chronic obstructive pulmonary disease, unspecified: Secondary | ICD-10-CM | POA: Diagnosis not present

## 2016-03-17 NOTE — Progress Notes (Signed)
  Subjective:   Patient ID: [name] BarrenPatricia A [name], female    DOB: 04/30/1945, 71 y.o.   MRN: [id number]  HPI [name] is a 71 year old with moderate COPD. Upper airway irritation syndrome and chronic cough. Allergic rhinitis.  ROV 11/15/15 -- this follow-up visit for severe upper airway irritation, vocal cord dysfunction, chronic cough, all superimposed on moderate obstructive lung disease. She has been managed on Spiriva and albuterol when necessary. She also uses an allergy regimen that consists of nasal wash, nasal steroids, allegra. She has noticed some increase in cough, non-productive. She has been dyspneic as well. Has used her albuterol nebulizer a bit more frequently over last several days. Some pain between her shoulder blades   ROV 03/17/16 -- Patient has a history of vocal cord dysfunction, severe upper airway irritation and chronic cough. She also has moderate obstructive lung disease/COPD. She was treated 1 week ago for increased congestion, increased cough, associated wheezing. Reports that she also had a flare in January as well. She had been using her flonase only prn, allegra qd. She did a trial of Anoro but did not like it, went back to Spiriva. She is only taking omeprazole prn, restarted it. She is starting to feel a bit better.   Objective:   Vitals:   03/17/16 0948  BP: 122/64  Pulse: 85  SpO2: 95%  Weight: 167 lb 9.6 oz (76 kg)  Height: 5\' 5"  (1.651 m)  Gen: Pleasant, obese, in no distress,  normal affect  ENT: No lesions,  mouth clear,  oropharynx clear, no postnasal drip  Neck: No JVD, no TMG, no carotid bruits  Lungs: No use of accessory muscles, few scattered end-exp wheezes.   Cardiovascular: RRR, heart sounds normal, no murmur or gallops, no peripheral edema  Musculoskeletal: No deformities, no cyanosis or clubbing  Neuro: alert, non focal  Skin: Warm, no lesions or rashes   Assessment & Plan:  Vocal cord dysfunction With a recent flare. She is  completing a prednisone taper. She does not want to use omeprazole or a PPI on a regular basis. She is using fluticasone nasal spray but only when necessary. Suspect that she will continue to flare in absence of control of her sustainers. She is content to get prednisone whenever she needs it. I have discussed the risks and my aversion to this plan. This is the way she wants to proceed.   COPD (chronic obstructive pulmonary disease) Did not like Anoro, does not want to try Stiolto at this time. Continue Spiriva.  Continue O2 as ordered.     [name] Pupaobert Siren Porrata, MD, PhD 03/17/2016, 10:15 AM [facility] [phone] or if no answer [phone]

## 2016-03-17 NOTE — Patient Instructions (Signed)
Please continue your Spiriva daily Finish your prednisone as ordered Wear your oxygen with exertion.  Continue allegra daily Start using your flonase 2 sprays each nostril every day during the spring allergy months.  Use omeprazole 20mg  daily as needed for reflux, cough at night Follow with Dr Delton CoombesByrum in 4 months or sooner if you have any problems.

## 2016-03-17 NOTE — Assessment & Plan Note (Signed)
Did not like Anoro, does not want to try Stiolto at this time. Continue Spiriva.  Continue O2 as ordered.

## 2016-03-17 NOTE — Assessment & Plan Note (Signed)
With a recent flare. She is completing a prednisone taper. She does not want to use omeprazole or a PPI on a regular basis. She is using fluticasone nasal spray but only when necessary. Suspect that she will continue to flare in absence of control of her sustainers. She is content to get prednisone whenever she needs it. I have discussed the risks and my aversion to this plan. This is the way she wants to proceed.

## 2016-04-11 ENCOUNTER — Other Ambulatory Visit: Payer: Self-pay

## 2016-04-11 MED ORDER — ALBUTEROL SULFATE HFA 108 (90 BASE) MCG/ACT IN AERS: INHALATION_SPRAY | RESPIRATORY_TRACT | 1 refills | Status: DC

## 2016-04-11 NOTE — Telephone Encounter (Signed)
[facility] sent fax for refills on albuterol inhaler. reefills sent.

## 2016-07-31 ENCOUNTER — Telehealth: Payer: Self-pay | Admitting: Emergency Medicine

## 2016-07-31 ENCOUNTER — Encounter: Payer: Self-pay | Admitting: Emergency Medicine

## 2016-07-31 ENCOUNTER — Ambulatory Visit (INDEPENDENT_AMBULATORY_CARE_PROVIDER_SITE_OTHER): Payer: Medicare Other | Admitting: Emergency Medicine

## 2016-07-31 VITALS — BP 130/64 | HR 99 | Ht 61.5 in | Wt 168.8 lb

## 2016-07-31 DIAGNOSIS — J449 Chronic obstructive pulmonary disease, unspecified: Secondary | ICD-10-CM

## 2016-07-31 MED ORDER — PREDNISONE 10 MG PO TABS: ORAL_TABLET | ORAL | 0 refills | Status: DC

## 2016-07-31 NOTE — Telephone Encounter (Signed)
Spoke with pt and she states she saw RB today and she spoke with him about switching DME's from [facility] to [facility] and she states she got a phone call from [facility] and she states this was not suppose to go to them.  Per pt she states she has been with [facility] over 5 years and she can switch to another DME. Had to place a new order to [facility].  RB this is just a [place]

## 2016-07-31 NOTE — Progress Notes (Signed)
  Subjective:   Patient ID: [name] BarrenPatricia A [name], female    DOB: 10/15/1945, 71 y.o.   MRN: [id number]  HPI [name] [name] is a 71 year old with moderate COPD. Upper airway irritation syndrome and chronic cough. Allergic rhinitis.  ROV 11/15/15 -- this follow-up visit for severe upper airway irritation, vocal cord dysfunction, chronic cough, all superimposed on moderate obstructive lung disease. She has been managed on Spiriva and albuterol when necessary. She also uses an allergy regimen that consists of nasal wash, nasal steroids, allegra. She has noticed some increase in cough, non-productive. She has been dyspneic as well. Has used her albuterol nebulizer a bit more frequently over last several days. Some pain between her shoulder blades   ROV 03/17/16 -- Patient has a history of vocal cord dysfunction, severe upper airway irritation and chronic cough. She also has moderate obstructive lung disease/COPD. She was treated 1 week ago for increased congestion, increased cough, associated wheezing. Reports that she also had a flare in January as well. She had been using her flonase only prn, allegra qd. She did a trial of Anoro but did not like it, went back to Spiriva. She is only taking omeprazole prn, restarted it. She is starting to feel a bit better.  ROV 07/31/16 -- This follow-up visit for patient with a history of multifactorial upper airway irritation and chronic cough. She also has moderate obstructive lung disease and chronic hypoxemic respiratory failure.  She reports that her throat has been raspy and sore in the am. She is using omeprazole prn - seems to help her cough especially at night. She remains on allegra and flonase. She remains on spiriva. She walked today > 3L/min pulsed with exertion.     Objective:   Vitals:   07/31/16 0952  BP: 130/64  Pulse: 99  SpO2: 92%  Weight: 168 lb 12.8 oz (76.6 kg)  Height: 5' 1.5" (1.562 m)  Gen: Pleasant, obese, in no distress,  normal affect  ENT:  No lesions,  mouth clear,  oropharynx clear, no postnasal drip  Neck: No JVD, no Stridor  Lungs: No use of accessory muscles, good air movement, no wheezing  Cardiovascular: RRR, heart sounds normal, no murmur or gallops, no peripheral edema  Musculoskeletal: No deformities, no cyanosis or clubbing  Neuro: alert, non focal  Skin: Warm, no lesions or rashes   Assessment & Plan:  COPD (chronic obstructive pulmonary disease) We will order a POC through [facility]. 2 L/m at rest, 3 L/m with exertion. Pulsed oxygen Please continue Spiriva once a day Use albuterol 2 puffs as needed We will give you a prescription for prednisone to take with you while you travel encase your breathing flares. Follow with Dr Delton CoombesByrum in 4 months or sooner if you have any problems.  Cough Please start using your omeprazole 20 mg once daily on a schedule. Take this medication either 1 hour before or 1 hour after eating. Please continue Allegra, fluticasone nasal spray.    Levy Pupaobert Byrum, MD, PhD 07/31/2016, 12:12 PM [facility] [phone] or if no answer [phone]

## 2016-07-31 NOTE — Assessment & Plan Note (Addendum)
We will order a POC through [facility]. 2 L/m at rest, 3 L/m with exertion. Pulsed oxygen Please continue Spiriva once a day Use albuterol 2 puffs as needed We will give you a prescription for prednisone to take with you while you travel encase your breathing flares. Follow with Dr Delton CoombesByrum in 4 months or sooner if you have any problems.

## 2016-07-31 NOTE — Patient Instructions (Signed)
We will order a PCO through [facility]. 2 L/m at rest, 3 L/m with exertion. Pulsed oxygen Please start using your omeprazole 20 mg once daily on a schedule. Take this medication either 1 hour before or 1 hour after eating. Please continue Allegra, fluticasone nasal spray. Please continue Spiriva once a day Use albuterol 2 puffs as needed We will give you a prescription for prednisone to take with you while you travel encase your breathing flares. Follow with Dr Delton CoombesByrum in 4 months or sooner if you have any problems.

## 2016-07-31 NOTE — Assessment & Plan Note (Signed)
Please start using your omeprazole 20 mg once daily on a schedule. Take this medication either 1 hour before or 1 hour after eating. Please continue Allegra, fluticasone nasal spray.

## 2016-07-31 NOTE — Telephone Encounter (Signed)
Note other note from today.

## 2016-08-07 ENCOUNTER — Telehealth: Payer: Self-pay | Admitting: Emergency Medicine

## 2016-08-07 NOTE — Telephone Encounter (Signed)
Error.[name] [name] ° °

## 2016-09-28 ENCOUNTER — Other Ambulatory Visit: Payer: Self-pay | Admitting: Family Medicine

## 2016-09-28 DIAGNOSIS — Z1231 Encounter for screening mammogram for malignant neoplasm of breast: Secondary | ICD-10-CM

## 2016-10-17 ENCOUNTER — Ambulatory Visit: Payer: Medicare Other

## 2016-10-20 ENCOUNTER — Ambulatory Visit
Admission: RE | Admit: 2016-10-20 | Discharge: 2016-10-20 | Disposition: A | Discharge status: Home / Self Care | Payer: Medicare Other | Source: Ambulatory Visit | Attending: Family Medicine | Admitting: Family Medicine

## 2016-10-20 DIAGNOSIS — Z1231 Encounter for screening mammogram for malignant neoplasm of breast: Secondary | ICD-10-CM

## 2016-10-26 ENCOUNTER — Ambulatory Visit (INDEPENDENT_AMBULATORY_CARE_PROVIDER_SITE_OTHER): Payer: Medicare Other | Admitting: Emergency Medicine

## 2016-10-26 ENCOUNTER — Encounter: Payer: Self-pay | Admitting: Emergency Medicine

## 2016-10-26 DIAGNOSIS — J383 Other diseases of vocal cords: Secondary | ICD-10-CM

## 2016-10-26 DIAGNOSIS — J449 Chronic obstructive pulmonary disease, unspecified: Secondary | ICD-10-CM

## 2016-10-26 DIAGNOSIS — J301 Allergic rhinitis due to pollen: Secondary | ICD-10-CM | POA: Diagnosis not present

## 2016-10-26 MED ORDER — PREDNISONE 10 MG PO TABS: ORAL_TABLET | ORAL | 0 refills | Status: DC

## 2016-10-26 MED ORDER — AZITHROMYCIN 250 MG PO TABS: ORAL_TABLET | ORAL | 0 refills | Status: AC

## 2016-10-26 NOTE — Assessment & Plan Note (Signed)
With an acute exacerbation characterized by bilateral wheezing, acute on chronic exertional dyspnea.  Please take azithromycin as directed until completely gone Please take prednisone as ordered until completely gone Please continue Spiriva and albuterol as you are using them Flu shot up-to-date Follow with Dr Delton CoombesByrum in 4 months or sooner if you have any problems.

## 2016-10-26 NOTE — Patient Instructions (Addendum)
Please take azithromycin as directed until completely gone Please take prednisone as ordered until completely gone Please continue Spiriva and albuterol as you are using them Continue fluticasone nasal spray, Allegra as you are using them Consider taking your omeprazole every day, especially if your cough begins to flare Flu shot up-to-date Follow with Dr Delton CoombesByrum in 4 months or sooner if you have any problems.

## 2016-10-26 NOTE — Assessment & Plan Note (Signed)
Consider taking your omeprazole every day, especially if your cough begins to flare

## 2016-10-26 NOTE — Progress Notes (Signed)
Subjective:   Patient ID: [name] [name], female    DOB: 05/20/1945, 71 y.o.   MRN: [id number]  HPI [name] [name] is a 71 year old with moderate COPD. Upper airway irritation syndrome and chronic cough. Allergic rhinitis.  ROV 11/15/15 -- this follow-up visit for severe upper airway irritation, vocal cord dysfunction, chronic cough, all superimposed on moderate obstructive lung disease. She has been managed on Spiriva and albuterol when necessary. She also uses an allergy regimen that consists of nasal wash, nasal steroids, allegra. She has noticed some increase in cough, non-productive. She has been dyspneic as well. Has used her albuterol nebulizer a bit more frequently over last several days. Some pain between her shoulder blades   ROV 03/17/16 -- Patient has a history of vocal cord dysfunction, severe upper airway irritation and chronic cough. She also has moderate obstructive lung disease/COPD. She was treated 1 week ago for increased congestion, increased cough, associated wheezing. Reports that she also had a flare in January as well. She had been using her flonase only prn, allegra qd. She did a trial of Anoro but did not like it, went back to Spiriva. She is only taking omeprazole prn, restarted it. She is starting to feel a bit better.  ROV 07/31/16 -- This follow-up visit for patient with a history of multifactorial upper airway irritation and chronic cough. She also has moderate obstructive lung disease and chronic hypoxemic respiratory failure.  She reports that her throat has been raspy and sore in the am. She is using omeprazole prn - seems to help her cough especially at night. She remains on allegra and flonase. She remains on spiriva. She walked today > 3L/min pulsed with exertion.   Acute OV 10/26/16 -- [name] [name] is 891. She has a history of moderate obstructive lung disease/COPD. Also significant comorbidity of vocal cord dysfunction and severe upper airway irritation. She feels  frequently with chronic cough. She has been managed on Spiriva. She uses fluticasone nasal spray, Allegra 60 mg daily. She is on omeprazole but uses prn. Over the last 4-5 days her shoulders have been sore. More SOB with exertion, needing her albuterol nebs more frequently. She hears some wheeze. No known sick contacts. Flu shot up to date.     Objective:   Vitals:   10/26/16 1150 10/26/16 1153  BP:  130/70  Pulse:  (!) 104  SpO2:  90%  Weight: 163 lb (73.9 kg)   Height: 5' 1.5" (1.562 m)     Gen: Pleasant, obese, in no distress,  normal affect  ENT: No lesions,  mouth clear,  oropharynx clear, no postnasal drip  Neck: No JVD, no Stridor  Lungs: No use of accessory muscles, distant, very soft exp wheeze bilaterally  Cardiovascular: RRR, heart sounds normal, no murmur or gallops, no peripheral edema  Musculoskeletal: No deformities, no cyanosis or clubbing  Neuro: alert, non focal  Skin: Warm, no lesions or rashes   Assessment & Plan:  COPD (chronic obstructive pulmonary disease) With an acute exacerbation characterized by bilateral wheezing, acute on chronic exertional dyspnea.  Please take azithromycin as directed until completely gone Please take prednisone as ordered until completely gone Please continue Spiriva and albuterol as you are using them Flu shot up-to-date Follow with Dr Delton CoombesByrum in 4 months or sooner if you have any problems.  Allergic rhinitis Continue fluticasone nasal spray, Allegra as you are using them  Vocal cord dysfunction Consider taking your omeprazole every day, especially if your cough begins to  flare    [name] Pupa, MD, PhD 10/26/2016, 12:07 PM [facility] [phone] or if no answer [phone]

## 2016-10-26 NOTE — Assessment & Plan Note (Signed)
Continue fluticasone nasal spray, Allegra as you are using them

## 2016-10-26 NOTE — Addendum Note (Signed)
Addended by: Jaynee EaglesLEMONS, LINDSAY C on: 10/26/2016 12:08 PM   Modules accepted: Orders

## 2016-12-04 ENCOUNTER — Telehealth: Payer: Self-pay | Admitting: Cardiovascular Disease

## 2016-12-04 NOTE — Telephone Encounter (Signed)
Received incoming records from [facility] for upcoming appointment on 12/06/16 @ 9:40am with Dr. Duke Salviaandolph. [facility]. 12/04/16 ab

## 2016-12-05 ENCOUNTER — Encounter: Payer: Self-pay | Admitting: Emergency Medicine

## 2016-12-05 ENCOUNTER — Ambulatory Visit: Payer: Medicare Other | Admitting: Emergency Medicine

## 2016-12-05 VITALS — BP 132/80 | HR 87 | Ht 60.0 in | Wt 164.4 lb

## 2016-12-05 DIAGNOSIS — J383 Other diseases of vocal cords: Secondary | ICD-10-CM | POA: Diagnosis not present

## 2016-12-05 DIAGNOSIS — J449 Chronic obstructive pulmonary disease, unspecified: Secondary | ICD-10-CM | POA: Diagnosis not present

## 2016-12-05 DIAGNOSIS — J301 Allergic rhinitis due to pollen: Secondary | ICD-10-CM

## 2016-12-05 DIAGNOSIS — K219 Gastro-esophageal reflux disease without esophagitis: Secondary | ICD-10-CM | POA: Insufficient documentation

## 2016-12-05 NOTE — Assessment & Plan Note (Signed)
-  Appears to be stable at this time  °  °

## 2016-12-05 NOTE — Patient Instructions (Addendum)
Please continue Spiriva once daily as you are using it Keep albuterol available to use 2 puffs if needed for shortness of breath Continue oxygen at all times Continue your fluticasone nasal spray, Allegra as you are using them Please start taking omeprazole 20 mg twice a day for the next week.  You may then decrease to once a day.  Take this medication either 1 hour before or 1 hour after eating. Agree with evaluation by cardiology. Follow with [name] [name] in 3 months or sooner if you have any problems.

## 2016-12-05 NOTE — Assessment & Plan Note (Signed)
Her wheezing is improved on exam.  She continues to have chest heaviness and tightness with some associated dyspnea.  She was treated by me with prednisone and then subsequently with prednisone when the symptoms persisted.  I am not convinced that her residual symptoms are her COPD.  Question whether she is experiencing an increase in her GERD, not currently on scheduled medications.  We will attempt to address this.  I agree with the cardiology evaluation given the continued dyspnea and chest heaviness.  Continue Spiriva, oxygen as ordered. Agree with cardiology evaluation

## 2016-12-05 NOTE — Assessment & Plan Note (Signed)
Continue current medications. 

## 2016-12-05 NOTE — Progress Notes (Signed)
Subjective:   Patient ID: [name] BarrenPatricia A [name], female    DOB: 08/02/1945, 71 y.o.   MRN: [id number]  HPI [name] [name] is a 71 year old with moderate COPD. Upper airway irritation syndrome and chronic cough. Allergic rhinitis.  ROV 11/15/15 -- this follow-up visit for severe upper airway irritation, vocal cord dysfunction, chronic cough, all superimposed on moderate obstructive lung disease. [name] [name] has been managed on Spiriva and albuterol when necessary. [name] [name] also uses an allergy regimen that consists of nasal wash, nasal steroids, allegra. [name] [name] has noticed some increase in cough, non-productive. [name] [name] has been dyspneic as well. Has used her albuterol nebulizer a bit more frequently over last several days. Some pain between her shoulder blades   ROV 03/17/16 -- Patient has a history of vocal cord dysfunction, severe upper airway irritation and chronic cough. [name] [name] also has moderate obstructive lung disease/COPD. [name] [name] was treated 1 week ago for increased congestion, increased cough, associated wheezing. Reports that [name] [name] also had a flare in January as well. [name] [name] had been using her flonase only prn, allegra qd. [name] [name] did a trial of Anoro but did not like it, went back to Spiriva. [name] [name] is only taking omeprazole prn, restarted it. [name] [name] is starting to feel a bit better.  ROV 07/31/16 -- This follow-up visit for patient with a history of multifactorial upper airway irritation and chronic cough. [name] [name] also has moderate obstructive lung disease and chronic hypoxemic respiratory failure.  [name] [name] reports that her throat has been raspy and sore in the am. [name] [name] is using omeprazole prn - seems to help her cough especially at night. [name] [name] remains on allegra and flonase. [name] [name] remains on spiriva. [name] [name] walked today > 3L/min pulsed with exertion.   Acute OV 10/26/16 -- [name] [name] is 89. [name] [name] has a history of moderate obstructive lung disease/COPD. Also significant comorbidity of vocal cord dysfunction and severe upper airway irritation. [name] [name] feels  frequently with chronic cough. [name] [name] has been managed on Spiriva. [name] [name] uses fluticasone nasal spray, Allegra 60 mg daily. [name] [name] is on omeprazole but uses prn. Over the last 4-5 days her shoulders have been sore. More SOB with exertion, needing her albuterol nebs more frequently. [name] [name] hears some wheeze. No known sick contacts. Flu shot up to date.    ROV 12/05/16 --patient has a history of moderate COPD, vocal cord dysfunction with severe upper airway irritation and chronic cough.  Contribute factors include allergic rhinitis, probably also GERD although this is only been treated intermittently.  I saw her one month ago with wheezing and dyspnea consistent with an acute exacerbation.  [name] [name] was treated with prednisone, azithromycin. [name] [name] reports that [name] [name] isn't sure that it helped her breathing very much, continued to have chest heaviness and tightness. The cough improved some, taking mucinex currently. [name] [name] is having some burping, gas without overt heartburn.  Objective:   Vitals:   12/05/16 0905  BP: 132/80  Pulse: 87  SpO2: 92%  Weight: 164 lb 6 oz (74.6 kg)  Height: 5' (1.524 m)    Gen: Pleasant, obese, in no distress,  normal affect  ENT: No lesions,  mouth clear,  oropharynx clear, no postnasal drip  Neck: No JVD, no Stridor  Lungs: No use of accessory muscles, distant, very soft exp wheeze bilaterally  Cardiovascular: RRR, heart sounds normal, no murmur or gallops, no peripheral edema  Musculoskeletal: No deformities, no cyanosis or clubbing  Neuro: alert, non focal  Skin: Warm, no lesions or rashes   Assessment & Plan:  COPD  (chronic obstructive pulmonary disease) Her wheezing is improved on exam.  [name] [name] continues to have chest heaviness and tightness with some associated dyspnea.  [name] [name] was treated by me with prednisone and then subsequently with prednisone when the symptoms persisted.  I am not convinced that her residual symptoms are her COPD.  Question whether [name] [name] is experiencing an increase in her GERD, not currently on scheduled medications.  We will attempt to address this.  I agree with the cardiology evaluation given the continued dyspnea and chest heaviness.  Continue Spiriva, oxygen as ordered. Agree with cardiology evaluation  Allergic rhinitis Continue current medications  Vocal cord dysfunction Appears to be stable at this time  GERD (gastroesophageal reflux disease) [name] [name] is not currently using scheduled medication, only takes omeprazole when her cough is flaring.  Question whether this may be contributing to her chest tightness, chest heaviness.  [name] [name] does have some gas, belching.  I have asked her to restart scheduled omeprazole.  Even so agree with her cardiology evaluation.    Levy Pupaobert Jayten Gabbard, MD, PhD 12/05/2016, 9:24 AM [facility] [phone] or if no answer [phone]

## 2016-12-05 NOTE — Assessment & Plan Note (Signed)
She is not currently using scheduled medication, only takes omeprazole when her cough is flaring.  Question whether this may be contributing to her chest tightness, chest heaviness.  She does have some gas, belching.  I have asked her to restart scheduled omeprazole.  Even so agree with her cardiology evaluation.

## 2016-12-06 ENCOUNTER — Encounter: Payer: Self-pay | Admitting: Cardiovascular Disease

## 2016-12-06 ENCOUNTER — Ambulatory Visit: Payer: Medicare Other | Admitting: Cardiovascular Disease

## 2016-12-06 VITALS — BP 128/74 | HR 93 | Ht 60.0 in | Wt 164.8 lb

## 2016-12-06 DIAGNOSIS — R079 Chest pain, unspecified: Secondary | ICD-10-CM

## 2016-12-06 DIAGNOSIS — E7800 Pure hypercholesterolemia, unspecified: Secondary | ICD-10-CM | POA: Diagnosis not present

## 2016-12-06 DIAGNOSIS — R0789 Other chest pain: Secondary | ICD-10-CM | POA: Diagnosis not present

## 2016-12-06 DIAGNOSIS — I119 Hypertensive heart disease without heart failure: Secondary | ICD-10-CM | POA: Diagnosis not present

## 2016-12-06 LAB — BASIC METABOLIC PANEL
BUN/Creatinine Ratio: 10 — ABNORMAL LOW (ref 12–28)
BUN: 9 mg/dL (ref 8–27)
CO2: 28 mmol/L (ref 20–29)
Calcium: 8.7 mg/dL (ref 8.7–10.3)
Chloride: 103 mmol/L (ref 96–106)
Creatinine, Ser: 0.92 mg/dL (ref 0.57–1.00)
GFR calc Af Amer: 72 mL/min/{1.73_m2} (ref >59)
GFR calc non Af Amer: 62 mL/min/{1.73_m2} (ref >59)
Glucose: 160 mg/dL — ABNORMAL HIGH (ref 65–99)
Potassium: 4.5 mmol/L (ref 3.5–5.2)
Sodium: 143 mmol/L (ref 134–144)

## 2016-12-06 NOTE — Patient Instructions (Addendum)
Medication Instructions: Your physician recommends that you continue on your current medications as directed. Please refer to the Current Medication list given to you today.  Labwork: BMET   Testing/Procedures: CORONARY CTA   Follow-Up: Your physician wants you to follow-up in: 1 YEAR OV  You will receive a reminder letter in the mail two months in advance. If you don't receive a letter, please call our office to schedule the follow-up appointment.  Any Other Special Instructions Will Be Listed Below (If Applicable).  Please [facility] at the [facility] main entrance of [facility] at xx:xx AM (30-45 minutes prior to test start time)  [facility] [address] [place], [place] [address] [phone]  Proceed to the [facility] Radiology Department (First Floor).  Please follow these instructions carefully (unless otherwise directed):  Hold all erectile dysfunction medications at least 48 hours prior to test.  On the Night Before the Test: . Drink plenty of water. . Do not consume any caffeinated/decaffeinated beverages or chocolate 12 hours prior to your test. . Do not take any antihistamines 12 hours prior to your test. . If you take Metformin do not take 24 hours prior to test. . If the patient has contrast allergy: ? Patient will need a prescription for Prednisone and very clear instructions (as follows): 1. Prednisone 50 mg - take 13 hours prior to test 2. Take another Prednisone 50 mg 7 hours prior to test 3. Take another Prednisone 50 mg 1 hour prior to test 4. Take Benadryl 50 mg 1 hour prior to test . Patient must complete all four doses of above prophylactic medications. . Patient will need a ride after test due to Benadryl.  On the Day of the Test: . Drink plenty of water. Do not drink any water within one hour of the test. . Do not eat any food 4 hours prior to the test. . You may take your regular medications prior to the test. . IF NOT ON A  BETA BLOCKER - Take 50 mg of lopressor (metoprolol) one hour before the test. . HOLD Furosemide morning of the test.  After the Test: . Drink plenty of water. . After receiving IV contrast, you may experience a mild flushed feeling. This is normal. . On occasion, you may experience a mild rash up to 24 hours after the test. This is not dangerous. If this occurs, you can take Benadryl 25 mg and increase your fluid intake. . If you experience trouble breathing, this can be serious. If it is severe call 911 IMMEDIATELY. If it is mild, please call our office. . If you take any of these medications: Glipizide/Metformin, Avandament, Glucavance, please do not take 48 hours after completing test.

## 2016-12-06 NOTE — Progress Notes (Signed)
Cardiology Office Note   Date:  12/06/2016   ID:  LUDIA GARTLAND, DOB [birth year], MRN [id number]  PCP:  Shirlean Mylar, MD  Cardiologist:   Chilton Si, MD   Chief Complaint  Patient presents with  . New Patient (Initial Visit)      History of Present Illness: [name] [name] is a 71 y.o. female with hypertension, hyperlipiemia, GERD, and moderate COPD who is being seen today for the evaluation of chest tightness at the request of Shirlean Mylar, MD.  Her symptoms began 10/2016.  She saw Dr. Delton Coombes and reported shortness of breath and wheezing.  She was started on prednisone and Azithromycin.  She was seen at [facility] clinic 11/2016 due to chest tightness.  EKG revealed sinus rhythm and no ischemic changes.  There was concern that this was still a COPD exacerbation and prednisone was resumed.  She hasn't noted any improvement after taking prednisone.  Her symptoms are worse when lying on her side.  They are also sometime worse with exertion.  There was no associated nausea or diaphoresis but she has been more short of breath lately.  She hasn't noted any lower extremity edema, orthopnea or PND. She has a history of GERD and vocal cord dysfunction but only takes omeprazole when her cough is flaring.  She uses oxygen on an as needed basis.  Past Medical History:  Diagnosis Date  . Allergic rhinitis, cause unspecified   . Chronic airway obstruction, not elsewhere classified   . Depressive disorder, not elsewhere classified   . Nephritis and nephropathy, not specified as acute or chronic, with unspecified pathological lesion in kidney   . Obesity, unspecified   . Other and unspecified hyperlipidemia   . Thyrotoxicosis without mention of goiter or other cause, without mention of thyrotoxic crisis or storm   . Unspecified essential hypertension     History reviewed. No pertinent surgical history.   Current Outpatient Medications  Medication Sig Dispense Refill  . albuterol  (PROAIR HFA) 108 (90 Base) MCG/ACT inhaler INHALE 2 PUFFS INTO THE LUNGS EVERY 6 (SIX) HOURS AS NEEDED FOR WHEEZING OR SHORTNESS OF BREATH. 3 Inhaler 1  . albuterol (PROVENTIL) (2.5 MG/3ML) 0.083% nebulizer solution Take 3 mLs (2.5 mg total) by nebulization every 4 (four) hours as needed for wheezing or shortness of breath (DX: COPD 496). 150 mL 12  . Ascorbic Acid (VITAMIN C) 1000 MG tablet Take 1,000 mg by mouth daily.      Marland Kitchen aspirin 81 MG chewable tablet Chew 81 mg by mouth daily.      Marland Kitchen atenolol (TENORMIN) 50 MG tablet Take 50 mg by mouth daily.      Marland Kitchen atorvastatin (LIPITOR) 20 MG tablet Take 20 mg by mouth daily.      . Calcium Carbonate-Vitamin D (CALTRATE 600+D) 600-400 MG-UNIT per tablet Take 2 tablets by mouth daily.      . citalopram (CELEXA) 40 MG tablet Take 40 mg by mouth daily.     . fexofenadine (ALLEGRA) 60 MG tablet Take 60 mg by mouth daily.      . fluticasone (FLONASE) 50 MCG/ACT nasal spray Place 2 sprays into both nostrils daily. 16 g 2  . levothyroxine (SYNTHROID, LEVOTHROID) 100 MCG tablet Take 100 mcg by mouth daily.    Marland Kitchen losartan (COZAAR) 25 MG tablet Take 25 mg by mouth daily.    . metFORMIN (GLUCOPHAGE-XR) 500 MG 24 hr tablet Take 1 tablet by mouth daily.  3  . Multiple Vitamins-Minerals (  CENTRUM PO) Take 1 tablet by mouth daily.      Marland Kitchen. omeprazole (PRILOSEC) 20 MG capsule Take 1 capsule (20 mg total) by mouth daily. (Patient taking differently: Take 20 mg by mouth daily as needed. ) 30 capsule 11  . ONE TOUCH ULTRA TEST test strip As directed    . predniSONE (DELTASONE) 10 MG tablet Take 4 tablets for 3 days, 3 tablets for 3 days, 2 tablets for 3 days, 1 tablet for 3 days 30 tablet 0  . tiotropium (SPIRIVA) 18 MCG inhalation capsule Place 18 mcg into inhaler and inhale daily.      . traZODone (DESYREL) 50 MG tablet 1 tablet at bedtime and may repeat dose one time if needed.     No current facility-administered medications for this visit.     Allergies:   Lisinopril  and Sulfonamide derivatives    Social History:  The patient  reports that she quit smoking about 22 years ago. She has a 60.00 pack-year smoking history. she has never used smokeless tobacco. She reports that she does not drink alcohol.   Family History:  The patient's family history includes CAD in her mother; Cancer in her brother; Diabetes in her brother and mother; Heart failure in her mother; Hyperlipidemia in her mother; Hypertension in her mother; Stroke in her maternal grandfather.    ROS:  Please see the history of present illness.   Otherwise, review of systems are positive for none.   All other systems are reviewed and negative.    PHYSICAL EXAM: VS:  BP 128/74   Pulse 93   Ht 5' (1.524 m)   Wt 164 lb 12.8 oz (74.8 kg)   BMI 32.19 kg/m  , BMI Body mass index is 32.19 kg/m. GENERAL:  Well appearing.  No acute distress. HEENT:  Pupils equal round and reactive, fundi not visualized, oral mucosa unremarkable NECK:  No jugular venous distention, waveform within normal limits, carotid upstroke brisk and symmetric, no bruits, no thyromegaly LUNGS:  Mild expiratory wheezing diffusely.  HEART:  RRR.  PMI not displaced or sustained,S1 and S2 within normal limits, no S3, no S4, no clicks, no rubs, no murmurs ABD:  Flat, positive bowel sounds normal in frequency in pitch, no bruits, no rebound, no guarding, no midline pulsatile mass, no hepatomegaly, no splenomegaly EXT:  2 plus pulses throughout, no edema, no cyanosis no clubbing SKIN:  No rashes no nodules NEURO:  Cranial nerves II through XII grossly intact, motor grossly intact throughout PSYCH:  Cognitively intact, oriented to person place and time   EKG:  EKG is not ordered today. 11/20/16: Sinus rhythm.  Rate 90 bpm.   Recent Labs: 12/06/2016: BUN 9; Creatinine, Ser 0.92; Potassium 4.5; Sodium 143    06/20/16: Sodium 141, potassium 4.2, BUN 10, creatinine 0.95 AST 25, ALT 23 Hemoglobin A1c 6.7% Total cholesterol 130,  triglycerides 158 HDL 43 LDL 55 TSH 0.96   Lipid Panel No results found for: CHOL, TRIG, HDL, CHOLHDL, VLDL, LDLCALC, LDLDIRECT    Wt Readings from Last 3 Encounters:  12/06/16 164 lb 12.8 oz (74.8 kg)  12/05/16 164 lb 6 oz (74.6 kg)  10/26/16 163 lb (73.9 kg)      ASSESSMENT AND PLAN:  # Atypical chest pain:  Symptoms are atypical.  She has known coronary calcification based on her CT scan in 2007.  She has persistent wheezing on exam so she is not a candidate for [employer] Myoview.  We will get a coronary CT-A to  assess for significant obstruction.  Continue aspirin, atenolol, and atorvastatin.   # Hypertension:  Blood pressure well-controlled on atenolol and losartan.   # Hyperlipidemia:  Continue atorvastatin. LDL 55 06/2016.   Current medicines are reviewed at length with the patient today.  The patient does not have concerns regarding medicines.  The following changes have been made:  no change  Labs/ tests ordered today include:   Orders Placed This Encounter  Procedures  . CT CORONARY MORPH W/CTA COR W/SCORE W/CA W/CM &/OR WO/CM  . CT CORONARY FRACTIONAL FLOW RESERVE DATA PREP  . CT CORONARY FRACTIONAL FLOW RESERVE FLUID ANALYSIS  . Basic metabolic panel     Disposition:   FU with Misty Rago C. Duke Salviaandolph, MD, [facility] in one year.    This note was written with the assistance of speech recognition software.  Please excuse any transcriptional errors.  Signed, Astella Desir C. Duke Salviaandolph, MD, [facility]  12/06/2016 7:34 PM    [facility]

## 2016-12-14 ENCOUNTER — Ambulatory Visit: Payer: Medicare Other | Admitting: Emergency Medicine

## 2016-12-14 ENCOUNTER — Encounter: Payer: Self-pay | Admitting: Emergency Medicine

## 2016-12-14 DIAGNOSIS — K219 Gastro-esophageal reflux disease without esophagitis: Secondary | ICD-10-CM

## 2016-12-14 DIAGNOSIS — J383 Other diseases of vocal cords: Secondary | ICD-10-CM | POA: Diagnosis not present

## 2016-12-14 DIAGNOSIS — J301 Allergic rhinitis due to pollen: Secondary | ICD-10-CM | POA: Diagnosis not present

## 2016-12-14 DIAGNOSIS — J449 Chronic obstructive pulmonary disease, unspecified: Secondary | ICD-10-CM | POA: Diagnosis not present

## 2016-12-14 MED ORDER — AZITHROMYCIN 250 MG PO TABS: ORAL_TABLET | ORAL | 0 refills | Status: AC

## 2016-12-14 MED ORDER — PREDNISONE 10 MG PO TABS: ORAL_TABLET | ORAL | 0 refills | Status: DC

## 2016-12-14 NOTE — Assessment & Plan Note (Signed)
Continue current plan of medications, fluticasone, Allegra

## 2016-12-14 NOTE — Assessment & Plan Note (Signed)
She does have upper airway noise and an apparent flare in her posterior pharynx.  There is no active lower airways wheezing.  She will likely benefit from prednisone as well as control of her allergic rhinitis, GERD which we are working on.  She is having yellow   Mucus and I will give her azithromycin.

## 2016-12-14 NOTE — Assessment & Plan Note (Signed)
Continue Spiriva.  No evidence of wheezing on today's exam

## 2016-12-14 NOTE — Patient Instructions (Addendum)
Continue your Spiriva as you are taking it Please continue your Allegra, Fluticasone nasal spray and your omeprazole as you are using them Albuterol available to use as needed for shortness of breath, coughing, chest tightness Take prednisone as directed until completely gone Take azithromycin as directed until completely gone follow-up with Dr. Duke Salviaandolph in cardiology as planned Follow with Dr Delton CoombesByrum in 4 months or sooner if you have any problems.

## 2016-12-14 NOTE — Assessment & Plan Note (Signed)
Continue current omeprazole 

## 2016-12-14 NOTE — Progress Notes (Signed)
Subjective:   Patient ID: [name] [name], female    DOB: 1945/06/10, 71 y.o.   MRN: [id number]  HPI [name] [name] is a 71 year old with moderate COPD. Upper airway irritation syndrome and chronic cough. Allergic rhinitis.  Acute OV 10/26/16 -- [name] [name] is 71. She has a history of moderate obstructive lung disease/COPD. Also significant comorbidity of vocal cord dysfunction and severe upper airway irritation. She feels frequently with chronic cough. She has been managed on Spiriva. She uses fluticasone nasal spray, Allegra 60 mg daily. She is on omeprazole but uses prn. Over the last 4-5 days her shoulders have been sore. More SOB with exertion, needing her albuterol nebs more frequently. She hears some wheeze. No known sick contacts. Flu shot up to date.   ROV 12/05/16 --patient has a history of moderate COPD, vocal cord dysfunction with severe upper airway irritation and chronic cough.  Contribute factors include allergic rhinitis, probably also GERD although this is only been treated intermittently.  I saw her one month ago with wheezing and dyspnea consistent with an acute exacerbation.  She was treated with prednisone, azithromycin. She reports that she isn't sure that it helped her breathing very much, continued to have chest heaviness and tightness. The cough improved some, taking mucinex currently. She is having some burping, gas without overt heartburn.    Acute OC 12/14/16 --acute office visit for patient with a history of chronic cough and upper airway irritation, superimposed moderate COPD.  At her last visit she was doing fairly well, I restarted her omeprazole and continued her allergy regimen, continued Spiriva. Beginning about 2 days ago increased cough and wheeze, SOB. Using her nebs, brief relief. She is working w Dr Duke Salvia, planning for cardiac CT. She is coughing up yellow to white mucous. Remains on Spiriva.  Objective:   Vitals:   12/14/16 1033 12/14/16 1034  BP:  114/72  Pulse:  88  SpO2:  90%  Weight: 164 lb (74.4 kg)   Height: 5' (1.524 m)     Gen: Pleasant, obese, in no distress,  normal affect  ENT: No lesions,  mouth clear,  oropharynx clear, no postnasal drip  Neck: No JVD, soft upper airway noise, inspiratory and expiratory stridor  Lungs: No use of accessory muscles, distant, no wheezing but some referred upper airway noise  Cardiovascular: RRR, heart sounds normal, no murmur or gallops, no peripheral edema  Musculoskeletal: No deformities, no cyanosis or clubbing  Neuro: alert, non focal  Skin: Warm, no lesions or rashes   Assessment & Plan:  Vocal cord dysfunction She does have upper airway noise and an apparent flare in her posterior pharynx.  There is no active lower airways wheezing.  She will likely benefit from prednisone as well as control of her allergic rhinitis, GERD which we are working on.  She is having yellow   Mucus and I will give her azithromycin.  COPD (chronic obstructive pulmonary disease) Continue Spiriva.  No evidence of wheezing on today's exam  Allergic rhinitis Continue current plan of medications, fluticasone, Allegra  GERD (gastroesophageal reflux disease) Continue current omeprazole    Levy Pupaobert Toddrick Sanna, MD, PhD 12/14/2016, 10:55 AM [facility] [phone] or if no answer [phone]

## 2017-02-02 ENCOUNTER — Other Ambulatory Visit: Payer: Self-pay | Admitting: *Deleted

## 2017-02-02 DIAGNOSIS — Z01812 Encounter for preprocedural laboratory examination: Secondary | ICD-10-CM | POA: Diagnosis not present

## 2017-02-02 DIAGNOSIS — R079 Chest pain, unspecified: Secondary | ICD-10-CM | POA: Diagnosis not present

## 2017-02-02 LAB — BASIC METABOLIC PANEL
BUN/Creatinine Ratio: 15 (ref 12–28)
BUN: 14 mg/dL (ref 8–27)
CO2: 28 mmol/L (ref 20–29)
Calcium: 9.4 mg/dL (ref 8.7–10.3)
Chloride: 101 mmol/L (ref 96–106)
Creatinine, Ser: 0.95 mg/dL (ref 0.57–1.00)
GFR calc Af Amer: 69 mL/min/{1.73_m2} (ref >59)
GFR calc non Af Amer: 60 mL/min/{1.73_m2} (ref >59)
Glucose: 119 mg/dL — ABNORMAL HIGH (ref 65–99)
Potassium: 4 mmol/L (ref 3.5–5.2)
Sodium: 143 mmol/L (ref 134–144)

## 2017-02-06 ENCOUNTER — Ambulatory Visit (HOSPITAL_COMMUNITY)
Admission: RE | Admit: 2017-02-06 | Discharge: 2017-02-06 | Disposition: A | Discharge status: Home / Self Care | Payer: Medicare HMO | Source: Ambulatory Visit | Attending: Cardiovascular Disease | Admitting: Cardiovascular Disease

## 2017-02-06 DIAGNOSIS — I119 Hypertensive heart disease without heart failure: Secondary | ICD-10-CM

## 2017-02-06 DIAGNOSIS — I700 Atherosclerosis of aorta: Secondary | ICD-10-CM | POA: Insufficient documentation

## 2017-02-06 DIAGNOSIS — R079 Chest pain, unspecified: Secondary | ICD-10-CM | POA: Diagnosis not present

## 2017-02-06 DIAGNOSIS — R0789 Other chest pain: Secondary | ICD-10-CM | POA: Diagnosis not present

## 2017-02-06 DIAGNOSIS — K449 Diaphragmatic hernia without obstruction or gangrene: Secondary | ICD-10-CM | POA: Diagnosis not present

## 2017-02-06 DIAGNOSIS — J439 Emphysema, unspecified: Secondary | ICD-10-CM | POA: Diagnosis not present

## 2017-02-06 MED ORDER — METOPROLOL TARTRATE 5 MG/5ML IV SOLN
INTRAVENOUS | Status: AC
  Filled 2017-02-06: qty 5

## 2017-02-06 MED ORDER — METOPROLOL TARTRATE 5 MG/5ML IV SOLN
INTRAVENOUS | Status: AC
  Filled 2017-02-06: qty 10

## 2017-02-06 MED ORDER — IOPAMIDOL (ISOVUE-370) INJECTION 76%
INTRAVENOUS | Status: AC
  Administered 2017-02-06: 80 mL via INTRAVENOUS
  Filled 2017-02-06: qty 100

## 2017-02-06 MED ORDER — DILTIAZEM HCL 25 MG/5ML IV SOLN
10.0000 mg | Freq: Once | INTRAVENOUS | Status: AC
  Administered 2017-02-06: 5 mg via INTRAVENOUS
  Filled 2017-02-06: qty 5

## 2017-02-06 MED ORDER — NITROGLYCERIN 0.4 MG SL SUBL
SUBLINGUAL_TABLET | SUBLINGUAL | Status: AC
  Filled 2017-02-06: qty 2

## 2017-02-06 MED ORDER — METOPROLOL TARTRATE 5 MG/5ML IV SOLN
5.0000 mg | INTRAVENOUS | Status: DC | PRN
  Administered 2017-02-06 (×3): 5 mg via INTRAVENOUS
  Filled 2017-02-06: qty 5

## 2017-02-06 MED ORDER — NITROGLYCERIN 0.4 MG SL SUBL
0.8000 mg | SUBLINGUAL_TABLET | Freq: Once | SUBLINGUAL | Status: AC
  Administered 2017-02-06: 0.8 mg via SUBLINGUAL
  Filled 2017-02-06: qty 25

## 2017-02-08 DIAGNOSIS — J449 Chronic obstructive pulmonary disease, unspecified: Secondary | ICD-10-CM | POA: Diagnosis not present

## 2017-02-09 DIAGNOSIS — H25013 Cortical age-related cataract, bilateral: Secondary | ICD-10-CM | POA: Diagnosis not present

## 2017-02-09 DIAGNOSIS — H35371 Puckering of macula, right eye: Secondary | ICD-10-CM | POA: Diagnosis not present

## 2017-02-09 DIAGNOSIS — E119 Type 2 diabetes mellitus without complications: Secondary | ICD-10-CM | POA: Diagnosis not present

## 2017-02-09 DIAGNOSIS — H43821 Vitreomacular adhesion, right eye: Secondary | ICD-10-CM | POA: Diagnosis not present

## 2017-02-21 ENCOUNTER — Ambulatory Visit: Payer: Medicare HMO | Admitting: Adult Health

## 2017-02-21 ENCOUNTER — Encounter: Payer: Self-pay | Admitting: Cardiology

## 2017-02-21 ENCOUNTER — Other Ambulatory Visit: Payer: Self-pay | Admitting: Cardiology

## 2017-02-21 ENCOUNTER — Ambulatory Visit: Payer: Medicare HMO | Admitting: Cardiology

## 2017-02-21 VITALS — BP 122/72 | HR 93 | Ht 60.0 in | Wt 168.0 lb

## 2017-02-21 DIAGNOSIS — R0789 Other chest pain: Secondary | ICD-10-CM | POA: Diagnosis not present

## 2017-02-21 DIAGNOSIS — R079 Chest pain, unspecified: Secondary | ICD-10-CM | POA: Insufficient documentation

## 2017-02-21 DIAGNOSIS — E785 Hyperlipidemia, unspecified: Secondary | ICD-10-CM | POA: Diagnosis not present

## 2017-02-21 DIAGNOSIS — R931 Abnormal findings on diagnostic imaging of heart and coronary circulation: Secondary | ICD-10-CM | POA: Diagnosis not present

## 2017-02-21 DIAGNOSIS — E119 Type 2 diabetes mellitus without complications: Secondary | ICD-10-CM | POA: Insufficient documentation

## 2017-02-21 DIAGNOSIS — J449 Chronic obstructive pulmonary disease, unspecified: Secondary | ICD-10-CM | POA: Diagnosis not present

## 2017-02-21 DIAGNOSIS — I10 Essential (primary) hypertension: Secondary | ICD-10-CM

## 2017-02-21 DIAGNOSIS — R072 Precordial pain: Secondary | ICD-10-CM

## 2017-02-21 MED ORDER — NITROGLYCERIN 0.4 MG SL SUBL: 0.4000 mg | SUBLINGUAL_TABLET | SUBLINGUAL | 4 refills | Status: AC | PRN

## 2017-02-21 NOTE — Assessment & Plan Note (Signed)
On low dose statin Rx 

## 2017-02-21 NOTE — Patient Instructions (Signed)
   [facility] [address] [place] [address] Dept: [phone] Loc: [phone]  Festus Barrenatricia A Rilling  02/21/2017  You are scheduled for a Cardiac Catheterization on Tuesday, February 19 with Dr. Peter SwazilandJordan.  1. [facility] at [facility]: [address] [place], [place] [address] at 5:30 AM (two hours before your procedure to ensure your preparation). Free valet parking service is available.   Special note: Every effort is made to have your procedure done on time. Please understand that emergencies sometimes delay scheduled procedures.  2. Diet: Do not eat or drink anything after midnight prior to your procedure except sips of water to take medications.  3. Labs: Please have lab work drawn today in our office.  4. Medication instructions in preparation for your procedure:  Stop taking, Glucophage (Metformin) on Monday, February 18.    On the morning of your procedure, take your Aspirin and any morning medicines NOT listed above.  You may use sips of water.  5. Plan for one night stay--bring personal belongings. 6. Bring a current list of your medications and current insurance cards. 7. You MUST have a responsible person to drive you home. 8. Someone MUST be with you the first 24 hours after you arrive home or your discharge will be delayed. 9. Please wear clothes that are easy to get on and off and wear slip-on shoes.  Thank you for allowing us to care for you!   -- [facility]

## 2017-02-21 NOTE — Assessment & Plan Note (Signed)
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

## 2017-02-21 NOTE — Assessment & Plan Note (Signed)
Controlled.  

## 2017-02-21 NOTE — Assessment & Plan Note (Signed)
Hold Metformin pre cath

## 2017-02-21 NOTE — Assessment & Plan Note (Signed)
Significant COPD. Followed by Dr Delton CoombesByrum, on O2 24 hrs a day-PRN

## 2017-02-21 NOTE — Progress Notes (Signed)
02/21/2017 [name] [name]   01/13/1945  [id number]  Primary Physician Shirlean MylarWebb, Carol, MD Primary Cardiologist: Dr Duke Salviaandolph  HPI:  72 y/o female with COPD, HTN, HLD, and obesity, seen by Dr Duke Salviaandolph for 11/20/16 for chest tightness. She had been having symptoms since Oct 2018. Initially I believe it was thought her symptoms were from COPD exacerbation. She has intermittent chest tightness that is not always exertional. A coronary CTA was done as an OP 02/06/17 and there was a suggestion of significant CFX disease. She was brought 1/31//91 back for FFR which confirmed significant proximal FX disease. She is seen in the office now for pre cath work up. She continues to have off and on chest tightness, currently she notices more when she lays on her Lt side.     Current Outpatient Medications  Medication Sig Dispense Refill  . albuterol (PROAIR HFA) 108 (90 Base) MCG/ACT inhaler INHALE 2 PUFFS INTO THE LUNGS EVERY 6 (SIX) HOURS AS NEEDED FOR WHEEZING OR SHORTNESS OF BREATH. 3 Inhaler 1  . albuterol (PROVENTIL) (2.5 MG/3ML) 0.083% nebulizer solution Take 3 mLs (2.5 mg total) by nebulization every 4 (four) hours as needed for wheezing or shortness of breath (DX: COPD 496). 150 mL 12  . Ascorbic Acid (VITAMIN C) 1000 MG tablet Take 1,000 mg by mouth daily.      Marland Kitchen. aspirin 81 MG chewable tablet Chew 81 mg by mouth daily.      Marland Kitchen. atenolol (TENORMIN) 50 MG tablet Take 50 mg by mouth daily.      Marland Kitchen. atorvastatin (LIPITOR) 20 MG tablet Take 20 mg by mouth daily.      . Calcium Carbonate-Vitamin D (CALTRATE 600+D) 600-400 MG-UNIT per tablet Take 2 tablets by mouth daily.      . citalopram (CELEXA) 40 MG tablet Take 40 mg by mouth daily.     . fexofenadine (ALLEGRA) 60 MG tablet Take 60 mg by mouth daily.      . fluticasone (FLONASE) 50 MCG/ACT nasal spray Place 2 sprays into both nostrils daily. 16 g 2  . levothyroxine (SYNTHROID, LEVOTHROID) 100 MCG tablet Take 100 mcg by mouth daily.    Marland Kitchen. losartan  (COZAAR) 25 MG tablet Take 25 mg by mouth daily.    . metFORMIN (GLUCOPHAGE-XR) 500 MG 24 hr tablet Take 1 tablet by mouth daily.  3  . Multiple Vitamins-Minerals (CENTRUM PO) Take 1 tablet by mouth daily.      Marland Kitchen. omeprazole (PRILOSEC) 20 MG capsule Take 1 capsule (20 mg total) by mouth daily. 30 capsule 11  . ONE TOUCH ULTRA TEST test strip As directed    . tiotropium (SPIRIVA) 18 MCG inhalation capsule Place 18 mcg into inhaler and inhale daily.      . traZODone (DESYREL) 50 MG tablet 1 tablet at bedtime and may repeat dose one time if needed.    . nitroGLYCERIN (NITROSTAT) 0.4 MG SL tablet Place 1 tablet (0.4 mg total) under the tongue every 5 (five) minutes as needed for chest pain. 25 tablet 4   No current facility-administered medications for this visit.     Allergies  Allergen Reactions  . Lisinopril     cough  . Sulfonamide Derivatives     Past Medical History:  Diagnosis Date  . Allergic rhinitis, cause unspecified   . Chronic airway obstruction, not elsewhere classified   . Depressive disorder, not elsewhere classified   . Nephritis and nephropathy, not specified as acute or chronic, with unspecified pathological lesion in  kidney   . Obesity, unspecified   . Other and unspecified hyperlipidemia   . Thyrotoxicosis without mention of goiter or other cause, without mention of thyrotoxic crisis or storm   . Unspecified essential hypertension     Social History   Socioeconomic History  . Marital status: Widowed    Spouse name: Not on file  . Number of children: Not on file  . Years of education: Not on file  . Highest education level: Not on file  Social Needs  . Financial resource strain: Not on file  . Food insecurity - worry: Not on file  . Food insecurity - inability: Not on file  . Transportation needs - medical: Not on file  . Transportation needs - non-medical: Not on file  Occupational History  . Not on file  Tobacco Use  . Smoking status: Former Smoker     Packs/day: 2.00    Years: 30.00    Pack years: 60.00    Last attempt to quit: 01/09/1994    Years since quitting: 23.1  . Smokeless tobacco: Never Used  Substance and Sexual Activity  . Alcohol use: No  . Drug use: Not on file  . Sexual activity: Not on file  Other Topics Concern  . Not on file  Social History Narrative  . Not on file     Family History  Problem Relation Age of Onset  . Heart failure Mother   . Hyperlipidemia Mother   . Hypertension Mother   . Diabetes Mother   . CAD Mother   . Cancer Brother   . Stroke Maternal Grandfather   . Diabetes Brother      Review of Systems: General: negative for chills, fever, night sweats or weight changes.  Cardiovascular: negative for chest pain, dyspnea on exertion, edema, orthopnea, palpitations, paroxysmal nocturnal dyspnea or shortness of breath Dermatological: negative for rash Respiratory: negative for cough or wheezing Urologic: negative for hematuria Abdominal: negative for nausea, vomiting, diarrhea, bright red blood per rectum, melena, or hematemesis Neurologic: negative for visual changes, syncope, or dizziness All other systems reviewed and are otherwise negative except as noted above.    Blood pressure 122/72, pulse 93, height 5' (1.524 m), weight 168 lb (76.2 kg), SpO2 92 %.  General appearance: alert, cooperative, no distress and moderately obese Neck: no carotid bruit and no JVD Lungs: clear to auscultation bilaterally with expiratory wheezing Heart: regular rate and rhythm Abd: obese, non tender Extremities: extremities normal, atraumatic, no cyanosis or edema and Rt radial pulse 2/4 Pulses: 2+ and symmetric Skin: Skin color, texture, turgor normal. No rashes or lesions Neurologic: Grossly normal  EKG NSR, no acute changes  ASSESSMENT AND PLAN:   Chest pain with high risk of acute coronary syndrome Some typical, some atypical features  Abnormal cardiac CT angiography CT with FFR suggest  significant CFX disease  COPD (chronic obstructive pulmonary disease) Significant COPD. Followed by Dr Delton Coombes, on O2 24 hrs a day-PRN  Dyslipidemia On low dose statin Rx  Essential hypertension Controlled  Non-insulin treated type 2 diabetes mellitus (HCC) Hold Metformin pre cath   PLAN  Coronary angiogram and possible angioplasty discussed with the patient. The patient understands that risks included but are not limited to stroke (1 in 1000), death (1 in 1000), kidney failure [usually temporary] (1 in 500), bleeding (1 in 200), allergic reaction [possibly serious] (1 in 200).  The patient understands and agrees to proceed. Hold Metformin pre cath. Check labs today. Rx for SL NTG provided.  Corine Shelter PA-C 02/21/2017 2:24 PM

## 2017-02-21 NOTE — Assessment & Plan Note (Signed)
Some typical, some atypical features 

## 2017-02-21 NOTE — H&P (View-Only) (Signed)
02/21/2017 [name] [name]   01/13/1945  [id number]  Primary Physician Shirlean MylarWebb, Carol, MD Primary Cardiologist: Dr Duke Salviaandolph  HPI:  72 y/o female with COPD, HTN, HLD, and obesity, seen by Dr Duke Salviaandolph for 11/20/16 for chest tightness. She had been having symptoms since Oct 2018. Initially I believe it was thought her symptoms were from COPD exacerbation. She has intermittent chest tightness that is not always exertional. A coronary CTA was done as an OP 02/06/17 and there was a suggestion of significant CFX disease. She was brought 1/31//91 back for FFR which confirmed significant proximal FX disease. She is seen in the office now for pre cath work up. She continues to have off and on chest tightness, currently she notices more when she lays on her Lt side.     Current Outpatient Medications  Medication Sig Dispense Refill  . albuterol (PROAIR HFA) 108 (90 Base) MCG/ACT inhaler INHALE 2 PUFFS INTO THE LUNGS EVERY 6 (SIX) HOURS AS NEEDED FOR WHEEZING OR SHORTNESS OF BREATH. 3 Inhaler 1  . albuterol (PROVENTIL) (2.5 MG/3ML) 0.083% nebulizer solution Take 3 mLs (2.5 mg total) by nebulization every 4 (four) hours as needed for wheezing or shortness of breath (DX: COPD 496). 150 mL 12  . Ascorbic Acid (VITAMIN C) 1000 MG tablet Take 1,000 mg by mouth daily.      Marland Kitchen. aspirin 81 MG chewable tablet Chew 81 mg by mouth daily.      Marland Kitchen. atenolol (TENORMIN) 50 MG tablet Take 50 mg by mouth daily.      Marland Kitchen. atorvastatin (LIPITOR) 20 MG tablet Take 20 mg by mouth daily.      . Calcium Carbonate-Vitamin D (CALTRATE 600+D) 600-400 MG-UNIT per tablet Take 2 tablets by mouth daily.      . citalopram (CELEXA) 40 MG tablet Take 40 mg by mouth daily.     . fexofenadine (ALLEGRA) 60 MG tablet Take 60 mg by mouth daily.      . fluticasone (FLONASE) 50 MCG/ACT nasal spray Place 2 sprays into both nostrils daily. 16 g 2  . levothyroxine (SYNTHROID, LEVOTHROID) 100 MCG tablet Take 100 mcg by mouth daily.    Marland Kitchen. losartan  (COZAAR) 25 MG tablet Take 25 mg by mouth daily.    . metFORMIN (GLUCOPHAGE-XR) 500 MG 24 hr tablet Take 1 tablet by mouth daily.  3  . Multiple Vitamins-Minerals (CENTRUM PO) Take 1 tablet by mouth daily.      Marland Kitchen. omeprazole (PRILOSEC) 20 MG capsule Take 1 capsule (20 mg total) by mouth daily. 30 capsule 11  . ONE TOUCH ULTRA TEST test strip As directed    . tiotropium (SPIRIVA) 18 MCG inhalation capsule Place 18 mcg into inhaler and inhale daily.      . traZODone (DESYREL) 50 MG tablet 1 tablet at bedtime and may repeat dose one time if needed.    . nitroGLYCERIN (NITROSTAT) 0.4 MG SL tablet Place 1 tablet (0.4 mg total) under the tongue every 5 (five) minutes as needed for chest pain. 25 tablet 4   No current facility-administered medications for this visit.     Allergies  Allergen Reactions  . Lisinopril     cough  . Sulfonamide Derivatives     Past Medical History:  Diagnosis Date  . Allergic rhinitis, cause unspecified   . Chronic airway obstruction, not elsewhere classified   . Depressive disorder, not elsewhere classified   . Nephritis and nephropathy, not specified as acute or chronic, with unspecified pathological lesion in  kidney   . Obesity, unspecified   . Other and unspecified hyperlipidemia   . Thyrotoxicosis without mention of goiter or other cause, without mention of thyrotoxic crisis or storm   . Unspecified essential hypertension     Social History   Socioeconomic History  . Marital status: Widowed    Spouse name: Not on file  . Number of children: Not on file  . Years of education: Not on file  . Highest education level: Not on file  Social Needs  . Financial resource strain: Not on file  . Food insecurity - worry: Not on file  . Food insecurity - inability: Not on file  . Transportation needs - medical: Not on file  . Transportation needs - non-medical: Not on file  Occupational History  . Not on file  Tobacco Use  . Smoking status: Former Smoker     Packs/day: 2.00    Years: 30.00    Pack years: 60.00    Last attempt to quit: 01/09/1994    Years since quitting: 23.1  . Smokeless tobacco: Never Used  Substance and Sexual Activity  . Alcohol use: No  . Drug use: Not on file  . Sexual activity: Not on file  Other Topics Concern  . Not on file  Social History Narrative  . Not on file     Family History  Problem Relation Age of Onset  . Heart failure Mother   . Hyperlipidemia Mother   . Hypertension Mother   . Diabetes Mother   . CAD Mother   . Cancer Brother   . Stroke Maternal Grandfather   . Diabetes Brother      Review of Systems: General: negative for chills, fever, night sweats or weight changes.  Cardiovascular: negative for chest pain, dyspnea on exertion, edema, orthopnea, palpitations, paroxysmal nocturnal dyspnea or shortness of breath Dermatological: negative for rash Respiratory: negative for cough or wheezing Urologic: negative for hematuria Abdominal: negative for nausea, vomiting, diarrhea, bright red blood per rectum, melena, or hematemesis Neurologic: negative for visual changes, syncope, or dizziness All other systems reviewed and are otherwise negative except as noted above.    Blood pressure 122/72, pulse 93, height 5' (1.524 m), weight 168 lb (76.2 kg), SpO2 92 %.  General appearance: alert, cooperative, no distress and moderately obese Neck: no carotid bruit and no JVD Lungs: clear to auscultation bilaterally with expiratory wheezing Heart: regular rate and rhythm Abd: obese, non tender Extremities: extremities normal, atraumatic, no cyanosis or edema and Rt radial pulse 2/4 Pulses: 2+ and symmetric Skin: Skin color, texture, turgor normal. No rashes or lesions Neurologic: Grossly normal  EKG NSR, no acute changes  ASSESSMENT AND PLAN:   Chest pain with high risk of acute coronary syndrome Some typical, some atypical features  Abnormal cardiac CT angiography CT with FFR suggest  significant CFX disease  COPD (chronic obstructive pulmonary disease) Significant COPD. Followed by Dr Delton Coombes, on O2 24 hrs a day-PRN  Dyslipidemia On low dose statin Rx  Essential hypertension Controlled  Non-insulin treated type 2 diabetes mellitus (HCC) Hold Metformin pre cath   PLAN  Coronary angiogram and possible angioplasty discussed with the patient. The patient understands that risks included but are not limited to stroke (1 in 1000), death (1 in 1000), kidney failure [usually temporary] (1 in 500), bleeding (1 in 200), allergic reaction [possibly serious] (1 in 200).  The patient understands and agrees to proceed. Hold Metformin pre cath. Check labs today. Rx for SL NTG provided.  Corine Shelter PA-C 02/21/2017 2:24 PM

## 2017-02-22 LAB — CBC WITH DIFFERENTIAL/PLATELET
Basophils Absolute: 0 10*3/uL (ref 0.0–0.2)
Basos: 1 %
EOS (ABSOLUTE): 0.4 10*3/uL (ref 0.0–0.4)
Eos: 5 %
Hematocrit: 38.6 % (ref 34.0–46.6)
Hemoglobin: 12.5 g/dL (ref 11.1–15.9)
Immature Grans (Abs): 0 10*3/uL (ref 0.0–0.1)
Immature Granulocytes: 0 %
Lymphocytes Absolute: 1.6 10*3/uL (ref 0.7–3.1)
Lymphs: 20 %
MCH: 29.6 pg (ref 26.6–33.0)
MCHC: 32.4 g/dL (ref 31.5–35.7)
MCV: 91 fL (ref 79–97)
Monocytes Absolute: 0.7 10*3/uL (ref 0.1–0.9)
Monocytes: 9 %
Neutrophils Absolute: 5.3 10*3/uL (ref 1.4–7.0)
Neutrophils: 65 %
Platelets: 252 10*3/uL (ref 150–379)
RBC: 4.23 x10E6/uL (ref 3.77–5.28)
RDW: 13.7 % (ref 12.3–15.4)
WBC: 8.1 10*3/uL (ref 3.4–10.8)

## 2017-02-22 LAB — BASIC METABOLIC PANEL
BUN/Creatinine Ratio: 8 — ABNORMAL LOW (ref 12–28)
BUN: 7 mg/dL — ABNORMAL LOW (ref 8–27)
CO2: 25 mmol/L (ref 20–29)
Calcium: 8.9 mg/dL (ref 8.7–10.3)
Chloride: 104 mmol/L (ref 96–106)
Creatinine, Ser: 0.84 mg/dL (ref 0.57–1.00)
GFR calc Af Amer: 80 mL/min/{1.73_m2} (ref >59)
GFR calc non Af Amer: 70 mL/min/{1.73_m2} (ref >59)
Glucose: 100 mg/dL — ABNORMAL HIGH (ref 65–99)
Potassium: 4.3 mmol/L (ref 3.5–5.2)
Sodium: 146 mmol/L — ABNORMAL HIGH (ref 134–144)

## 2017-02-26 ENCOUNTER — Telehealth: Payer: Self-pay | Admitting: *Deleted

## 2017-02-26 ENCOUNTER — Encounter: Payer: Self-pay | Admitting: Cardiovascular Disease

## 2017-02-26 ENCOUNTER — Telehealth: Payer: Self-pay | Admitting: Emergency Medicine

## 2017-02-26 MED ORDER — PREDNISONE 10 MG PO TABS: ORAL_TABLET | ORAL | 0 refills | Status: DC

## 2017-02-26 MED ORDER — AZITHROMYCIN 250 MG PO TABS: ORAL_TABLET | ORAL | 0 refills | Status: DC

## 2017-02-26 NOTE — Telephone Encounter (Signed)
If she is feeling poorly please call first thing in the morning to reschedule.

## 2017-02-26 NOTE — Telephone Encounter (Signed)
Spoke with patient. She is aware of RB's recs. [facility] [place]. Patient stated that she is having a heart cath procedure tomorrow and wanted to know if the medications would effect the procedure.   Advised her to call her doctor's office and make them aware of the zpak and prednisone just in case. She verbalized understanding.   Nothing else needed at time of call.

## 2017-02-26 NOTE — Telephone Encounter (Signed)
[name] [name] is calling to find out if she can take Prednisone( want to take it for her COPD having problems breathing today )  before her procedure on tomorrow. Please call . Thanks

## 2017-02-26 NOTE — Telephone Encounter (Signed)
Spoke with patient and cath rescheduled for Thursday. Advised if she did not feel well morning of to call and cancel.

## 2017-02-26 NOTE — Telephone Encounter (Signed)
I spoke with patient, she states she really doesn't feel bad, but is short of breath with cough. After discussion, we decided to reschedule cath to 03/01/17 with Dr End, arrive 10 AM cath 12 noon.   C[facility]ter[facility] at [facility] for: Thursday February 21,2019 ay 12 noon Verified arrival [facility] and place[facility] at: 10 AM Nothing to eat or drink after midnight the night before the cath.  HOLD: No metformin 02/28/17, 03/01/17 and for 48 hours post cath.  Verified AM meds can be  taken pre-cath with sip of water including: ASA 81 mg am of cath.  Confirmed patient has responsible person to drive home post procedure and observe patient for 24 hours:yes  Pt states she is already feeling better after starting prednisone and Z-pak today.. Pt advised if she does not continue to improve to call back and let us know, she verbalized understanding and thanked me for the call.

## 2017-02-26 NOTE — Telephone Encounter (Signed)
Unable to reach pt or leave a message  

## 2017-02-26 NOTE — Telephone Encounter (Signed)
Pt contacted pre-catheterization scheduled at [facility] for: Tuesday February 19,2019 at 7:30 AM Verified arrival [facility] and place: [facility] at: 5:30 AM Nothing to eat or drink after midnight the night before the cath.  HOLD: No metformin 02/26/17, 02/27/17 and for 48 hours after cath.  Except hold medication AM meds can be  taken pre-cath with sip of water including: ASA 81 mg am of cath   Confirmed patient has responsible person to drive home post procedure and observe patient for 24 hours: yes  Other concerns: Pt states she was prescribed prednisone and Z pak  today for exacerbation of shortness of breath related to COPD. Pt denies a fever, does have cough with milky phlegm. Pt did not want to reschedule cath at this time, states she will be fine once she gets prednisone/ Z pak in her system.  Pt advised I will not reschedule cath at this time but will forward to Dr Duke Salviaandolph for her review/recommendations.

## 2017-02-26 NOTE — Telephone Encounter (Signed)
Please have her take pred taper > Take 40mg  daily for 3 days, then 30mg  daily for 3 days, then 20mg  daily for 3 days, then 10mg  daily for 3 days, then stop Take azithro, Z-pack.  Thanks

## 2017-02-26 NOTE — Telephone Encounter (Signed)
Spoke with patient. She stated that she has not been feeling well for the past few days. Increased SOB as well as a cough. Cough is productive with a "milky" colored phlegm. Denies any body aches or fever.   [facility] [place] on [address].   RB, please advise. Thanks!

## 2017-02-27 NOTE — Telephone Encounter (Signed)
I spoke with patient, she is feeling better, feels she will be ready for cath on 03/01/17, she knows she can call morning of cath if she is not feeling well.

## 2017-02-28 NOTE — Telephone Encounter (Signed)
Duplicate encounter. This encounter was created in error - please disregard. 

## 2017-03-01 ENCOUNTER — Ambulatory Visit (HOSPITAL_COMMUNITY)
Admission: RE | Disposition: A | Discharge status: Home / Self Care | Payer: Self-pay | Source: Ambulatory Visit | Attending: Internal Medicine

## 2017-03-01 ENCOUNTER — Ambulatory Visit (HOSPITAL_COMMUNITY)
Admission: RE | Admit: 2017-03-01 | Discharge: 2017-03-01 | Disposition: A | Discharge status: Home / Self Care | Payer: Medicare HMO | Source: Ambulatory Visit | Attending: Internal Medicine | Admitting: Internal Medicine

## 2017-03-01 DIAGNOSIS — E119 Type 2 diabetes mellitus without complications: Secondary | ICD-10-CM | POA: Diagnosis not present

## 2017-03-01 DIAGNOSIS — J449 Chronic obstructive pulmonary disease, unspecified: Secondary | ICD-10-CM | POA: Diagnosis not present

## 2017-03-01 DIAGNOSIS — R072 Precordial pain: Secondary | ICD-10-CM

## 2017-03-01 DIAGNOSIS — Z79899 Other long term (current) drug therapy: Secondary | ICD-10-CM | POA: Insufficient documentation

## 2017-03-01 DIAGNOSIS — Z7984 Long term (current) use of oral hypoglycemic drugs: Secondary | ICD-10-CM | POA: Insufficient documentation

## 2017-03-01 DIAGNOSIS — I10 Essential (primary) hypertension: Secondary | ICD-10-CM | POA: Diagnosis not present

## 2017-03-01 DIAGNOSIS — Z87891 Personal history of nicotine dependence: Secondary | ICD-10-CM | POA: Insufficient documentation

## 2017-03-01 DIAGNOSIS — I2510 Atherosclerotic heart disease of native coronary artery without angina pectoris: Secondary | ICD-10-CM | POA: Insufficient documentation

## 2017-03-01 DIAGNOSIS — R931 Abnormal findings on diagnostic imaging of heart and coronary circulation: Secondary | ICD-10-CM

## 2017-03-01 DIAGNOSIS — Z882 Allergy status to sulfonamides status: Secondary | ICD-10-CM | POA: Diagnosis not present

## 2017-03-01 DIAGNOSIS — Z7982 Long term (current) use of aspirin: Secondary | ICD-10-CM | POA: Insufficient documentation

## 2017-03-01 DIAGNOSIS — E669 Obesity, unspecified: Secondary | ICD-10-CM | POA: Insufficient documentation

## 2017-03-01 DIAGNOSIS — E785 Hyperlipidemia, unspecified: Secondary | ICD-10-CM | POA: Diagnosis not present

## 2017-03-01 DIAGNOSIS — Z8249 Family history of ischemic heart disease and other diseases of the circulatory system: Secondary | ICD-10-CM | POA: Insufficient documentation

## 2017-03-01 DIAGNOSIS — R079 Chest pain, unspecified: Secondary | ICD-10-CM | POA: Diagnosis not present

## 2017-03-01 DIAGNOSIS — Z6832 Body mass index (BMI) 32.0-32.9, adult: Secondary | ICD-10-CM | POA: Insufficient documentation

## 2017-03-01 HISTORY — PX: LEFT HEART CATH AND CORONARY ANGIOGRAPHY: CATH118249

## 2017-03-01 LAB — GLUCOSE, CAPILLARY: Glucose-Capillary: 143 mg/dL — ABNORMAL HIGH (ref 65–99)

## 2017-03-01 LAB — PROTIME-INR
INR: 1.01
Prothrombin Time: 13.2 seconds (ref 11.4–15.2)

## 2017-03-01 SURGERY — LEFT HEART CATH AND CORONARY ANGIOGRAPHY
Anesthesia: LOCAL

## 2017-03-01 MED ORDER — LIDOCAINE HCL (PF) 1 % IJ SOLN
INTRAMUSCULAR | Status: DC | PRN
  Administered 2017-03-01: 2 mL

## 2017-03-01 MED ORDER — FENTANYL CITRATE (PF) 100 MCG/2ML IJ SOLN
INTRAMUSCULAR | Status: DC | PRN
  Administered 2017-03-01: 25 ug via INTRAVENOUS

## 2017-03-01 MED ORDER — SODIUM CHLORIDE 0.9 % IV SOLN: INTRAVENOUS | Status: DC

## 2017-03-01 MED ORDER — HEPARIN (PORCINE) IN NACL 2-0.9 UNIT/ML-% IJ SOLN
INTRAMUSCULAR | Status: AC
  Filled 2017-03-01: qty 1000

## 2017-03-01 MED ORDER — SODIUM CHLORIDE 0.9% FLUSH: 3.0000 mL | Freq: Two times a day (BID) | INTRAVENOUS | Status: DC

## 2017-03-01 MED ORDER — FENTANYL CITRATE (PF) 100 MCG/2ML IJ SOLN
INTRAMUSCULAR | Status: AC
  Filled 2017-03-01: qty 2

## 2017-03-01 MED ORDER — VERAPAMIL HCL 2.5 MG/ML IV SOLN
INTRAVENOUS | Status: AC
  Filled 2017-03-01: qty 2

## 2017-03-01 MED ORDER — HEPARIN SODIUM (PORCINE) 1000 UNIT/ML IJ SOLN
INTRAMUSCULAR | Status: DC | PRN
  Administered 2017-03-01: 4000 [IU] via INTRAVENOUS

## 2017-03-01 MED ORDER — SODIUM CHLORIDE 0.9% FLUSH: 3.0000 mL | INTRAVENOUS | Status: DC | PRN

## 2017-03-01 MED ORDER — LIDOCAINE HCL (PF) 1 % IJ SOLN
INTRAMUSCULAR | Status: AC
  Filled 2017-03-01: qty 30

## 2017-03-01 MED ORDER — METFORMIN HCL ER 500 MG PO TB24: 500.0000 mg | ORAL_TABLET | Freq: Every day | ORAL | 3 refills | Status: DC

## 2017-03-01 MED ORDER — ASPIRIN 81 MG PO CHEW: 81.0000 mg | CHEWABLE_TABLET | ORAL | Status: DC

## 2017-03-01 MED ORDER — HEPARIN SODIUM (PORCINE) 1000 UNIT/ML IJ SOLN
INTRAMUSCULAR | Status: AC
  Filled 2017-03-01: qty 1

## 2017-03-01 MED ORDER — SODIUM CHLORIDE 0.9 % WEIGHT BASED INFUSION: 1.0000 mL/kg/h | INTRAVENOUS | Status: DC

## 2017-03-01 MED ORDER — IOPAMIDOL (ISOVUE-370) INJECTION 76%
INTRAVENOUS | Status: AC
  Filled 2017-03-01: qty 100

## 2017-03-01 MED ORDER — SODIUM CHLORIDE 0.9 % IV SOLN: 250.0000 mL | INTRAVENOUS | Status: DC | PRN

## 2017-03-01 MED ORDER — HEPARIN (PORCINE) IN NACL 2-0.9 UNIT/ML-% IJ SOLN
INTRAMUSCULAR | Status: AC | PRN
  Administered 2017-03-01: 500 mL

## 2017-03-01 MED ORDER — MIDAZOLAM HCL 2 MG/2ML IJ SOLN
INTRAMUSCULAR | Status: DC | PRN
Start: 2017-03-01 — End: 2017-03-01
  Administered 2017-03-01: 1 mg via INTRAVENOUS

## 2017-03-01 MED ORDER — VERAPAMIL HCL 2.5 MG/ML IV SOLN
INTRAVENOUS | Status: DC | PRN
  Administered 2017-03-01: 10 mL via INTRA_ARTERIAL

## 2017-03-01 MED ORDER — SODIUM CHLORIDE 0.9 % WEIGHT BASED INFUSION
3.0000 mL/kg/h | INTRAVENOUS | Status: AC
  Administered 2017-03-01: 3 mL/kg/h via INTRAVENOUS

## 2017-03-01 MED ORDER — MIDAZOLAM HCL 2 MG/2ML IJ SOLN
INTRAMUSCULAR | Status: AC
  Filled 2017-03-01: qty 2

## 2017-03-01 MED ORDER — IOPAMIDOL (ISOVUE-370) INJECTION 76%
INTRAVENOUS | Status: DC | PRN
  Administered 2017-03-01: 43 mL via INTRA_ARTERIAL

## 2017-03-01 SURGICAL SUPPLY — 11 items
CATH INFINITI 5FR ANG PIGTAIL (CATHETERS) ×1 IMPLANT
CATH OPTITORQUE TIG 4.0 5F (CATHETERS) ×1 IMPLANT
DEVICE RAD COMP TR BAND LRG (VASCULAR PRODUCTS) ×1 IMPLANT
GLIDESHEATH SLEND SS 6F .021 (SHEATH) ×1 IMPLANT
GUIDEWIRE INQWIRE 1.5J.035X260 (WIRE) IMPLANT
INQWIRE 1.5J .035X260CM (WIRE) ×2
KIT HEART LEFT (KITS) ×2 IMPLANT
KIT PREMIUM HAND CONTROLLER (KITS) ×1 IMPLANT
KIT SINGLE USE MANIFOLD (KITS) ×1 IMPLANT
PACK CARDIAC CATHETERIZATION (CUSTOM PROCEDURE TRAY) ×2 IMPLANT
TRANSDUCER W/STOPCOCK (MISCELLANEOUS) ×2 IMPLANT

## 2017-03-01 NOTE — Discharge Instructions (Signed)
°  HOLD METFORMIN FOR 48 HOURS. MAY RESUME ON SUN.  Radial Site Care Refer to this sheet in the next few weeks. These instructions provide you with information about caring for yourself after your procedure. Your health care provider may also give you more specific instructions. Your treatment has been planned according to current medical practices, but problems sometimes occur. Call your health care provider if you have any problems or questions after your procedure. What can I expect after the procedure? After your procedure, it is typical to have the following:  Bruising at the radial site that usually fades within 1-2 weeks.  Blood collecting in the tissue (hematoma) that may be painful to the touch. It should usually decrease in size and tenderness within 1-2 weeks.  Follow these instructions at home:  Take medicines only as directed by your health care provider.  You may shower 24-48 hours after the procedure or as directed by your health care provider. Remove the bandage (dressing) and gently wash the site with plain soap and water. Pat the area dry with a clean towel. Do not rub the site, because this may cause bleeding.  Do not take baths, swim, or use a hot tub until your health care provider approves.  Check your insertion site every day for redness, swelling, or drainage.  Do not apply powder or lotion to the site.  Do not flex or bend the affected arm for 24 hours or as directed by your health care provider.  Do not push or pull heavy objects with the affected arm for 24 hours or as directed by your health care provider.  Do not lift over 10 lb (4.5 kg) for 5 days after your procedure or as directed by your health care provider.  Ask your health care provider when it is okay to: ? Return to work or school. ? Resume usual physical activities or sports. ? Resume sexual activity.  Do not drive home if you are discharged the same day as the procedure. Have someone else drive  you.  You may drive 24 hours after the procedure unless otherwise instructed by your health care provider.  Do not operate machinery or power tools for 24 hours after the procedure.  If your procedure was done as an outpatient procedure, which means that you went home the same day as your procedure, a responsible adult should be with you for the first 24 hours after you arrive home.  Keep all follow-up visits as directed by your health care provider. This is important. Contact a health care provider if:  You have a fever.  You have chills.  You have increased bleeding from the radial site. Hold pressure on the site. Get help right away if:  You have unusual pain at the radial site.  You have redness, warmth, or swelling at the radial site.  You have drainage (other than a small amount of blood on the dressing) from the radial site.  The radial site is bleeding, and the bleeding does not stop after 30 minutes of holding steady pressure on the site.  Your arm or hand becomes pale, cool, tingly, or numb. This information is not intended to replace advice given to you by your health care provider. Make sure you discuss any questions you have with your health care provider. Document Released: 01/28/2010 Document Revised: 06/03/2015 Document Reviewed: 07/14/2013 Elsevier Interactive Patient Education  2018 [employer]

## 2017-03-01 NOTE — Brief Op Note (Signed)
BRIEF CARDIAC CATHETERIZATION NOTE  DATE: 03/01/2017 TIME: 12:32 PM  PATIENT:  Festus BarrenPatricia A Chui  72 y.o. female  PRE-OPERATIVE DIAGNOSIS:  Chest pain, abnormal cardiac CT  POST-OPERATIVE DIAGNOSIS:  Non-obstructive coronary artery disease  PROCEDURE:  Procedure(s): LEFT HEART CATH AND CORONARY ANGIOGRAPHY (N/A)  SURGEON:  Surgeon(s) and Role:    * Summar Mcglothlin, MD - Primary  FINDINGS: 1. Mild, non-obstructive CAD. No angiographic correlate to lesion described in proximal LCx on recent CTA. 2. Normal left ventricular contraction and filling pressure.  RECOMMENDATIONS: 1. Medical therapy and risk factor modification.  Yvonne Kendallhristopher Rubert Frediani, MD [facility] Pager: [phone]

## 2017-03-01 NOTE — Interval H&P Note (Signed)
History and Physical Interval Note:  03/01/2017 11:53 AM  [name] BarrenPatricia A [name]  has presented today for cardiac catheterization, with the diagnosis of chest pain. The various methods of treatment have been discussed with the patient and family. After consideration of risks, benefits and other options for treatment, the patient has consented to  Procedure(s): LEFT HEART CATH AND CORONARY ANGIOGRAPHY (N/A) as a surgical intervention .  The patient's history has been reviewed, patient examined, no change in status, stable for surgery.  I have reviewed the patient's chart and labs.  Questions were answered to the patient's satisfaction.    Cath Lab Visit (complete for each Cath Lab visit)  Clinical Evaluation Leading to the Procedure:   ACS: No.  Non-ACS:    Anginal Classification: CCS IV  Anti-ischemic medical therapy: Minimal Therapy (1 class of medications)  Non-Invasive Test Results: Intermediate-risk stress test findings: cardiac mortality 1-3%/year (cardiac CTA with possible significant proximal dominant LCx disease).  Prior CABG: No previous CABG  [name] [name]

## 2017-03-01 NOTE — Research (Signed)
CADFEM Informed Consent   Subject Name: [name] [name]  Subject met inclusion and exclusion criteria.  The informed consent form, study requirements and expectations were reviewed with the subject and questions and concerns were addressed prior to the signing of the consent form.  The subject verbalized understanding of the trail requirements.  The subject agreed to participate in the CADFEM trial and signed the informed consent.  The informed consent was obtained prior to performance of any protocol-specific procedures for the subject.  A copy of the signed informed consent was given to the subject and a copy was placed in the subject's medical record.  [name] 03/01/2017, 11:05 AM

## 2017-03-02 ENCOUNTER — Encounter (HOSPITAL_COMMUNITY): Payer: Self-pay | Admitting: Internal Medicine

## 2017-03-02 MED FILL — Heparin Sodium (Porcine) 2 Unit/ML in Sodium Chloride 0.9%: INTRAMUSCULAR | Qty: 500 | Status: AC

## 2017-03-05 ENCOUNTER — Ambulatory Visit: Payer: Medicare HMO | Admitting: Emergency Medicine

## 2017-03-05 ENCOUNTER — Encounter: Payer: Self-pay | Admitting: Emergency Medicine

## 2017-03-05 DIAGNOSIS — J449 Chronic obstructive pulmonary disease, unspecified: Secondary | ICD-10-CM | POA: Diagnosis not present

## 2017-03-05 DIAGNOSIS — K219 Gastro-esophageal reflux disease without esophagitis: Secondary | ICD-10-CM | POA: Diagnosis not present

## 2017-03-05 DIAGNOSIS — J301 Allergic rhinitis due to pollen: Secondary | ICD-10-CM

## 2017-03-05 NOTE — Assessment & Plan Note (Signed)
Omeprazole as ordered °

## 2017-03-05 NOTE — Assessment & Plan Note (Signed)
With a recent exacerbation.  She appears to be improving.  Unclear whether this was due to an upper respiratory infection or to the spring allergy exposures that are beginning.  We will treat her allergies more aggressively.  I would like to consider her for possible inhaled corticosteroid/ LABA given her frequent exacerbations and her continued dyspnea.  She just got 3 months of Spiriva.  We will follow-up in 3 months and discuss a possible change, consider Trelegy at that time.  Please continue Spiriva daily as you are taking it Keep albuterol, either inhaler or nebulizer, available to use up to every 4 hours if needed for shortness of breath, wheezing, chest tightness. Continue omeprazole as you are taking it Follow with Dr Delton CoombesByrum in 3 months or sooner if you have any problems.  At that point we will reassess for possible new scheduled bronchodilator medication that may give you additional benefit.

## 2017-03-05 NOTE — Progress Notes (Signed)
Subjective:   Patient ID: [name] BarrenPatricia A [name], female    DOB: 04/04/1945, 72 y.o.   MRN: [id number]  HPI [name] [name] is a 72 year old with moderate COPD. Upper airway irritation syndrome and chronic cough. Allergic rhinitis.  Acute OV 10/26/16 -- [name] [name] is 6671. [name] [name] has a history of moderate obstructive lung disease/COPD. Also significant comorbidity of vocal cord dysfunction and severe upper airway irritation. [name] [name] feels frequently with chronic cough. [name] [name] has been managed on Spiriva. [name] [name] uses fluticasone nasal spray, Allegra 60 mg daily. [name] [name] is on omeprazole but uses prn. Over the last 4-5 days her shoulders have been sore. More SOB with exertion, needing her albuterol nebs more frequently. [name] [name] hears some wheeze. No known sick contacts. Flu shot up to date.   ROV 12/05/16 --patient has a history of moderate COPD, vocal cord dysfunction with severe upper airway irritation and chronic cough.  Contribute factors include allergic rhinitis, probably also GERD although this is only been treated intermittently.  I saw her one month ago with wheezing and dyspnea consistent with an acute exacerbation.  [name] [name] was treated with prednisone, azithromycin. [name] [name] reports that [name] [name] isn't sure that it helped her breathing very much, continued to have chest heaviness and tightness. The cough improved some, taking mucinex currently. [name] [name] is having some burping, gas without overt heartburn.    Acute OC 12/14/16 --acute office visit for patient with a history of chronic cough and upper airway irritation, superimposed moderate COPD.  At her last visit [name] [name] was doing fairly well, I restarted her omeprazole and continued her allergy regimen, continued Spiriva. Beginning about 2 days ago increased cough and wheeze, SOB. Using her nebs, brief relief. [name] [name] is working w Dr Duke Salviaandolph, planning for cardiac CT. [name] [name] is coughing up yellow to white mucous. Remains on Spiriva.                ROV 03/05/17 --[name] [name] is a 89 and has a history  of moderate COPD, upper airway irritation syndrome with chronic cough.  [name] [name] has GERD and rhinitis both of which are contributors.  [name] [name] was treated for an acute exacerbation on 02/26/17 with prednisone and azithromycin. Felt like increased rhinitis and then more SOB, cough. [name] [name] is finishing prednisone, 3 more days. [name] [name] is still using her nebs prn.  Objective:   Vitals:   03/05/17 0916  BP: 120/70  Pulse: 85  SpO2: 91%  Weight: 161 lb (73 kg)  Height: 5' (1.524 m)    Gen: Pleasant, obese, in no distress,  normal affect  ENT: No lesions,  mouth clear,  oropharynx clear, no postnasal drip  Neck: No JVD, no stridor today (improved)  Lungs: No use of accessory muscles, distant, [name] [name] has bilateral end expiratory wheezes on forced expiration, non-during a normal respiratory cycle  Cardiovascular: RRR, heart sounds normal, no murmur or gallops, no peripheral edema  Musculoskeletal: No deformities, no cyanosis or clubbing  Neuro: alert, non focal  Skin: Warm, no lesions or rashes   Assessment & Plan:  COPD (chronic obstructive pulmonary disease) With a recent exacerbation.  [name] [name] appears to be improving.  Unclear whether this was due to an upper respiratory infection or to the spring allergy exposures that are beginning.  We will treat her allergies more aggressively.  I would like to consider her for possible inhaled corticosteroid/ LABA given her frequent exacerbations and her continued dyspnea.  [name] [name] just got 3 months of Spiriva.  We will follow-up in 3 months and discuss a possible change, consider Trelegy at that time.  Please continue Spiriva daily as you are taking it Keep albuterol, either inhaler or nebulizer, available to use up to  every 4 hours if needed for shortness of breath, wheezing, chest tightness. Continue omeprazole as you are taking it Follow with Dr Delton Coombes in 3 months or sooner if you have any problems.  At that point we will reassess for possible new scheduled bronchodilator medication that may give you additional benefit.  Allergic rhinitis Continue Allegra daily. Go ahead and restart your fluticasone nasal spray, 2 sprays each nostril every day.  Plan to take this every day on a schedule during the spring allergy season.  GERD (gastroesophageal reflux disease) Omeprazole as ordered    [name], MD, PhD 03/05/2017, 9:41 AM [facility] [phone] or if no answer [phone]

## 2017-03-05 NOTE — Patient Instructions (Addendum)
Please continue Spiriva daily as you are taking it Keep albuterol, either inhaler or nebulizer, available to use up to every 4 hours if needed for shortness of breath, wheezing, chest tightness. Continue omeprazole as you are taking it Continue Allegra daily. Go ahead and restart your fluticasone nasal spray, 2 sprays each nostril every day.  Plan to take this every day on a schedule during the spring allergy season. Follow with [name] [name] in 3 months or sooner if you have any problems.  At that point we will reassess for possible new scheduled bronchodilator medication that may give you additional benefit.

## 2017-03-05 NOTE — Assessment & Plan Note (Signed)
Continue Allegra daily. Go ahead and restart your fluticasone nasal spray, 2 sprays each nostril every day.  Plan to take this every day on a schedule during the spring allergy season.

## 2017-03-08 DIAGNOSIS — J449 Chronic obstructive pulmonary disease, unspecified: Secondary | ICD-10-CM | POA: Diagnosis not present

## 2017-03-23 ENCOUNTER — Telehealth: Payer: Self-pay | Admitting: Emergency Medicine

## 2017-03-23 ENCOUNTER — Other Ambulatory Visit: Payer: Self-pay | Admitting: Emergency Medicine

## 2017-03-23 MED ORDER — PREDNISONE 10 MG PO TABS: ORAL_TABLET | ORAL | 0 refills | Status: DC

## 2017-03-23 NOTE — Telephone Encounter (Signed)
Called and spoke with patient, she states that she is needing prednsione due to having trouble with breathing and the trees/flowers blooming. She would like for this to be sent to her pharmacy today she cannot wait over the weekend.   RB please advise on this, thanks.

## 2017-03-23 NOTE — Telephone Encounter (Signed)
Ok to send pred taper:  Take 40mg  daily for 3 days, then 30mg  daily for 3 days, then 20mg  daily for 3 days, then 10mg  daily for 3 days, then stop

## 2017-03-23 NOTE — Telephone Encounter (Signed)
Spoke with the pt and notified of recs per RB  She verbalized understanding  Rx was sent to pharm  Nothing further needed

## 2017-03-27 ENCOUNTER — Telehealth: Payer: Self-pay | Admitting: *Deleted

## 2017-03-27 NOTE — Telephone Encounter (Signed)
Left message to call back  Patient needs follow up appointment with Dr Duke Salviaandolph in May

## 2017-03-30 NOTE — Telephone Encounter (Signed)
Patient scheduled May 9

## 2017-04-08 DIAGNOSIS — J449 Chronic obstructive pulmonary disease, unspecified: Secondary | ICD-10-CM | POA: Diagnosis not present

## 2017-04-23 ENCOUNTER — Telehealth: Payer: Self-pay | Admitting: Emergency Medicine

## 2017-04-23 MED ORDER — PREDNISONE 10 MG PO TABS: ORAL_TABLET | ORAL | 0 refills | Status: DC

## 2017-04-23 NOTE — Telephone Encounter (Signed)
Patient is aware of RB's recs. Medication has been sent in. Nothing else needed at time of call.

## 2017-04-23 NOTE — Telephone Encounter (Signed)
Spoke with patient. She is requesting prednisone for increased SOB. Patient stated that since the weather has been changing, the SOB has gotten worse. Denies any fever or body aches.   She wishes to have a round of prednisone sent to [facility] on [address].   RB, please advise. Thanks!

## 2017-04-23 NOTE — Telephone Encounter (Signed)
Pred taper >> Take 40mg daily for 3 days, then 30mg daily for 3 days, then 20mg daily for 3 days, then 10mg daily for 3 days, then stop  

## 2017-05-08 DIAGNOSIS — J449 Chronic obstructive pulmonary disease, unspecified: Secondary | ICD-10-CM | POA: Diagnosis not present

## 2017-05-17 ENCOUNTER — Ambulatory Visit: Payer: Medicare HMO | Admitting: Cardiovascular Disease

## 2017-05-21 ENCOUNTER — Ambulatory Visit: Payer: Medicare HMO | Admitting: Emergency Medicine

## 2017-05-21 ENCOUNTER — Encounter: Payer: Self-pay | Admitting: Emergency Medicine

## 2017-05-21 DIAGNOSIS — K219 Gastro-esophageal reflux disease without esophagitis: Secondary | ICD-10-CM

## 2017-05-21 DIAGNOSIS — J301 Allergic rhinitis due to pollen: Secondary | ICD-10-CM

## 2017-05-21 DIAGNOSIS — R05 Cough: Secondary | ICD-10-CM | POA: Diagnosis not present

## 2017-05-21 DIAGNOSIS — R059 Cough, unspecified: Secondary | ICD-10-CM

## 2017-05-21 MED ORDER — TIOTROPIUM BROMIDE-OLODATEROL 2.5-2.5 MCG/ACT IN AERS: 2.0000 | INHALATION_SPRAY | Freq: Every day | RESPIRATORY_TRACT | 0 refills | Status: DC

## 2017-05-21 MED ORDER — ALBUTEROL SULFATE HFA 108 (90 BASE) MCG/ACT IN AERS: INHALATION_SPRAY | RESPIRATORY_TRACT | 1 refills | Status: DC

## 2017-05-21 NOTE — Progress Notes (Signed)
Subjective:   Patient ID: [name] [name], female    DOB: 1945/05/18, 72 y.o.   MRN: [id number]  HPI [name] [name] is a 72 year old with moderate COPD. Upper airway irritation syndrome and chronic cough. Allergic rhinitis.  ROV 12/05/16 --patient has a history of moderate COPD, vocal cord dysfunction with severe upper airway irritation and chronic cough.  Contribute factors include allergic rhinitis, probably also GERD although this is only been treated intermittently.  I saw her one month ago with wheezing and dyspnea consistent with an acute exacerbation.  She was treated with prednisone, azithromycin. She reports that she isn't sure that it helped her breathing very much, continued to have chest heaviness and tightness. The cough improved some, taking mucinex currently. She is having some burping, gas without overt heartburn.    Acute OC 12/14/16 --acute office visit for patient with a history of chronic cough and upper airway irritation, superimposed moderate COPD.  At her last visit she was doing fairly well, I restarted her omeprazole and continued her allergy regimen, continued Spiriva. Beginning about 2 days ago increased cough and wheeze, SOB. Using her nebs, brief relief. She is working w Dr Duke Salvia, planning for cardiac CT. She is coughing up yellow to white mucous. Remains on Spiriva.                ROV 03/05/17 --[name] [name] is a 72 and has a history of moderate COPD, upper airway irritation syndrome with chronic cough.  She has GERD and rhinitis both of which are contributors.  She was treated for an acute exacerbation on 02/26/17 with prednisone and azithromycin. Felt like increased rhinitis and then more SOB, cough. She is finishing prednisone, 3 more days. She is still using her nebs prn.      ROV 05/21/17 --this is a follow-up visit for [name] 72 year old [name] with a history of moderate COPD, significant upper airway irritation syndrome with associated chronic cough.  She has GERD  and allergic rhinitis that make her upper airway difficult to manage.  She was treated with prednisone in early April as well as in early March. Usually cough is the driver. She notes today that she has had some increased albuterol use and SOB. She is on allegra, uses flonase prn.                                                                                                                                                                                            Objective:   Vitals:   05/21/17 0924 05/21/17 0925  BP:  122/84  Pulse:  100  SpO2:  90%  Weight:  166 lb (75.3 kg)   Height: 5' (1.524 m)     Gen: [name], obese, in no distress,  normal affect  ENT: No lesions,  mouth clear,  oropharynx clear, no postnasal drip  Neck: No JVD, very mild UA noise  Lungs: No use of accessory muscles, distant, mild referred UA noise but also probably some true scattered exp wheeze  Cardiovascular: RRR, heart sounds normal, no murmur or gallops, no peripheral edema  Musculoskeletal: No deformities, no cyanosis or clubbing  Neuro: alert, non focal  Skin: Warm, no lesions or rashes   Assessment & Plan:  COPD (chronic obstructive pulmonary disease) Has been on Spiriva. Didn';t lik eAnoro when we trialed it. She is willing to try Stiolto to see if she gets any clinical benefit.  We will do this for 1 month and she will call us to let us know.  We will likely stay on Spiriva until her current supply runs out and then if she benefits we will try to start Stiolto at that time.  She will keep albuterol available as needed.  Allergic rhinitis On Allegra, using Flonase as needed.  I'd like for her to start taking the Flonase every day.  Cough With influence from both rhinitis, GERD.  We will try to aggressively treat both  GERD (gastroesophageal reflux disease) continue omeprazole    [name] Pupa, MD, PhD 05/21/2017, 9:54 AM [facility] [phone] or if no answer  [phone]

## 2017-05-21 NOTE — Assessment & Plan Note (Signed)
Has been on Spiriva. Didn';t lik eAnoro when we trialed it. She is willing to try Stiolto to see if she gets any clinical benefit.  We will do this for 1 month and she will call us to let us know.  We will likely stay on Spiriva until her current supply runs out and then if she benefits we will try to start Stiolto at that time.  She will keep albuterol available as needed.

## 2017-05-21 NOTE — Patient Instructions (Signed)
Please continue your allegra Start using your flonase 2 sprays each side every day during the pollen season. Continue omeprazole daily as you are taking it. Please temporarily stop Spiriva We will do a trial of Stiolto 2 puffs once a day to see if you benefit.  If so then we will potentially start this medication once your Spiriva runs out.  We will try to avoid adding any prednisone at this time. Continue your oxygen as ordered

## 2017-05-21 NOTE — Assessment & Plan Note (Signed)
With influence from both rhinitis, GERD.  We will try to aggressively treat both

## 2017-05-21 NOTE — Assessment & Plan Note (Signed)
-   continue omeprazole

## 2017-05-21 NOTE — Assessment & Plan Note (Signed)
On Allegra, using Flonase as needed.  I'd like for her to start taking the Flonase every day.

## 2017-05-26 DIAGNOSIS — J22 Unspecified acute lower respiratory infection: Secondary | ICD-10-CM | POA: Diagnosis not present

## 2017-05-26 DIAGNOSIS — J441 Chronic obstructive pulmonary disease with (acute) exacerbation: Secondary | ICD-10-CM | POA: Diagnosis not present

## 2017-06-05 ENCOUNTER — Telehealth: Payer: Self-pay | Admitting: Emergency Medicine

## 2017-06-05 MED ORDER — TIOTROPIUM BROMIDE-OLODATEROL 2.5-2.5 MCG/ACT IN AERS: 2.0000 | INHALATION_SPRAY | Freq: Every day | RESPIRATORY_TRACT | 1 refills | Status: DC

## 2017-06-05 NOTE — Telephone Encounter (Signed)
Spoke with pt and advised rx sent to pharmacy. Nothing further is needed.      Patient Instructions by Leslye Peer, MD at 05/21/2017 9:30 AM  Author: Leslye Peer, MD Author Type: Physician Filed: 05/21/2017 9:51 AM  Note Status: Signed Cosign: Cosign Not Required Encounter Date: 05/21/2017  Editor: Leslye Peer, MD (Physician)    Please continue your allegra Start using your flonase 2 sprays each side every day during the pollen season. Continue omeprazole daily as you are taking it. Please temporarily stop Spiriva We will do a trial of Stiolto 2 puffs once a day to see if you benefit.  If so then we will potentially start this medication once your Spiriva runs out.  We will try to avoid adding any prednisone at this time. Continue your oxygen as ordered

## 2017-06-08 DIAGNOSIS — J449 Chronic obstructive pulmonary disease, unspecified: Secondary | ICD-10-CM | POA: Diagnosis not present

## 2017-06-19 DIAGNOSIS — J22 Unspecified acute lower respiratory infection: Secondary | ICD-10-CM | POA: Diagnosis not present

## 2017-06-19 DIAGNOSIS — J449 Chronic obstructive pulmonary disease, unspecified: Secondary | ICD-10-CM | POA: Diagnosis not present

## 2017-07-02 ENCOUNTER — Ambulatory Visit: Payer: Medicare HMO | Admitting: Emergency Medicine

## 2017-07-02 ENCOUNTER — Encounter: Payer: Self-pay | Admitting: Emergency Medicine

## 2017-07-02 DIAGNOSIS — J301 Allergic rhinitis due to pollen: Secondary | ICD-10-CM | POA: Diagnosis not present

## 2017-07-02 DIAGNOSIS — K219 Gastro-esophageal reflux disease without esophagitis: Secondary | ICD-10-CM

## 2017-07-02 DIAGNOSIS — J449 Chronic obstructive pulmonary disease, unspecified: Secondary | ICD-10-CM | POA: Diagnosis not present

## 2017-07-02 MED ORDER — TIOTROPIUM BROMIDE-OLODATEROL 2.5-2.5 MCG/ACT IN AERS: 2.0000 | INHALATION_SPRAY | Freq: Every day | RESPIRATORY_TRACT | 0 refills | Status: DC

## 2017-07-02 MED ORDER — PREDNISONE 10 MG PO TABS: ORAL_TABLET | ORAL | 0 refills | Status: DC

## 2017-07-02 NOTE — Progress Notes (Signed)
Subjective:   Patient ID: [name] [name], female    DOB: 1945/12/08, 72 y.o.   MRN: [id number]  HPI [name] [name] is a 72 year old with moderate COPD. Upper airway irritation syndrome and chronic cough. Allergic rhinitis.  ROV 12/05/16 --patient has a history of moderate COPD, vocal cord dysfunction with severe upper airway irritation and chronic cough.  Contribute factors include allergic rhinitis, probably also GERD although this is only been treated intermittently.  I saw her one month ago with wheezing and dyspnea consistent with an acute exacerbation.  She was treated with prednisone, azithromycin. She reports that she isn't sure that it helped her breathing very much, continued to have chest heaviness and tightness. The cough improved some, taking mucinex currently. She is having some burping, gas without overt heartburn.    Acute OC 12/14/16 --acute office visit for patient with a history of chronic cough and upper airway irritation, superimposed moderate COPD.  At her last visit she was doing fairly well, I restarted her omeprazole and continued her allergy regimen, continued Spiriva. Beginning about 2 days ago increased cough and wheeze, SOB. Using her nebs, brief relief. She is working w Dr Duke Salvia, planning for cardiac CT. She is coughing up yellow to white mucous. Remains on Spiriva.                ROV 03/05/17 --[name] [name] is a 72 and has a history of moderate COPD, upper airway irritation syndrome with chronic cough.  She has GERD and rhinitis both of which are contributors.  She was treated for an acute exacerbation on 02/26/17 with prednisone and azithromycin. Felt like increased rhinitis and then more SOB, cough. She is finishing prednisone, 3 more days. She is still using her nebs prn.      ROV 05/21/17 --this is a follow-up visit for [name] [name] with a history of moderate COPD, significant upper airway irritation syndrome with associated chronic cough.  She has GERD  and allergic rhinitis that make her upper airway difficult to manage.  She was treated with prednisone in early April as well as in early March. Usually cough is the driver. She notes today that she has had some increased albuterol use and SOB. She is on allegra, uses flonase prn.        ROV 07/02/17 --[name] [name] is 72 with a history of moderate COPD and chronic cough with upper airway irritation syndrome.  She has GERD and allergic rhinitis as well. At her last visit we did a trial of changing Spiriva to Stiolto to see if she would benefit. She did like the Stiolto, felt that it helped her more. Then she went to get it filled and it was more expensive even though it is same tier as Spiriva. She is pursuing with her insurance company.  Objective:   Vitals:   07/02/17 1031 07/02/17 1032  BP:  100/62  Pulse:  78  SpO2:  90%  Weight: 163 lb (73.9 kg)   Height: 5' (1.524 m)     Gen: [name], obese, in no distress,  normal affect  ENT: No lesions,  mouth clear,  oropharynx clear, no postnasal drip  Neck: No JVD, no UA noise today  Lungs: No use of accessory muscles, distant, no wheeze  Cardiovascular: RRR, heart sounds normal, no murmur or gallops, no peripheral edema  Musculoskeletal: No deformities, no cyanosis or clubbing  Neuro: alert, non focal  Skin: Warm, no lesions or rashes   Assessment & Plan:  COPD (chronic obstructive pulmonary disease) She benefited from the Stiolto but the cost is high.  Difficult to understand since it is the same tier as her Spiriva.  She is going to work with her insurance company to see if she can clarify.  In the meantime I will give her samples of Stiolto and hopefully we can defray the cost some degree.  Continue her albuterol as needed.  Continue her oxygen as she is using it.  She is about to go to  [place] and she notes that she often flares when she has those exposures.  I will give her a prednisone taper to carry with her in case she gets symptoms in that environment.  She knows which symptoms to follow and when to fill it.  GERD (gastroesophageal reflux disease) Continue PPI as ordered  Allergic rhinitis Continue allergy regimen as ordered    Levy Pupaobert Erion Weightman, MD, PhD 07/02/2017, 11:06 AM [facility] [phone] or if no answer [phone]

## 2017-07-02 NOTE — Assessment & Plan Note (Signed)
Continue PPI as ordered 

## 2017-07-02 NOTE — Patient Instructions (Addendum)
We will give you samples of the Stiolto to restart now.  Speak with your insurance as planned regarding the Stiolto cost.  Please continue your other medications as you have been taking them We will give you a paper script for prednisone for you to take to [place].  Follow with Dr Delton CoombesByrum in 4 months or sooner if you have any problems.

## 2017-07-02 NOTE — Assessment & Plan Note (Signed)
Continue allergy regimen as ordered

## 2017-07-02 NOTE — Assessment & Plan Note (Addendum)
[facility] but the cost is high.  Difficult to understand since it is the same tier as her Spiriva.  She is going to work with her insurance company to see if she can clarify.  In the meantime I will give her samples of Stiolto and hopefully we can defray the cost some degree.  Continue her albuterol as needed.  Continue her oxygen as she is using it.  She is about to go to [place] and she notes that she often flares when she has those exposures.  I will give her a prednisone taper to carry with her in case she gets symptoms in that environment.  She knows which symptoms to follow and when to fill it.

## 2017-07-03 DIAGNOSIS — E785 Hyperlipidemia, unspecified: Secondary | ICD-10-CM | POA: Diagnosis not present

## 2017-07-03 DIAGNOSIS — E039 Hypothyroidism, unspecified: Secondary | ICD-10-CM | POA: Diagnosis not present

## 2017-07-03 DIAGNOSIS — F320 Major depressive disorder, single episode, mild: Secondary | ICD-10-CM | POA: Diagnosis not present

## 2017-07-03 DIAGNOSIS — Z7984 Long term (current) use of oral hypoglycemic drugs: Secondary | ICD-10-CM | POA: Diagnosis not present

## 2017-07-03 DIAGNOSIS — E1121 Type 2 diabetes mellitus with diabetic nephropathy: Secondary | ICD-10-CM | POA: Diagnosis not present

## 2017-07-03 DIAGNOSIS — I10 Essential (primary) hypertension: Secondary | ICD-10-CM | POA: Diagnosis not present

## 2017-07-08 DIAGNOSIS — J449 Chronic obstructive pulmonary disease, unspecified: Secondary | ICD-10-CM | POA: Diagnosis not present

## 2017-07-25 ENCOUNTER — Encounter: Payer: Self-pay | Admitting: Cardiovascular Disease

## 2017-07-25 ENCOUNTER — Ambulatory Visit: Payer: Medicare HMO | Admitting: Cardiovascular Disease

## 2017-07-25 VITALS — BP 102/56 | HR 107 | Ht 60.0 in | Wt 160.0 lb

## 2017-07-25 DIAGNOSIS — I2510 Atherosclerotic heart disease of native coronary artery without angina pectoris: Secondary | ICD-10-CM | POA: Diagnosis not present

## 2017-07-25 DIAGNOSIS — E7800 Pure hypercholesterolemia, unspecified: Secondary | ICD-10-CM

## 2017-07-25 DIAGNOSIS — I10 Essential (primary) hypertension: Secondary | ICD-10-CM

## 2017-07-25 HISTORY — DX: Atherosclerotic heart disease of native coronary artery without angina pectoris: I25.10

## 2017-07-25 NOTE — Patient Instructions (Signed)
Medication Instructions:  Your physician recommends that you continue on your current medications as directed. Please refer to the Current Medication list given to you today.  Labwork: NONE  Testing/Procedures: NONE  Follow-Up: Your physician wants you to follow-up in: 1 YEAR  You will receive a reminder letter in the mail two months in advance. If you don't receive a letter, please call our office to schedule the follow-up appointment.  If you need a refill on your cardiac medications before your next appointment, please call your pharmacy.  

## 2017-07-25 NOTE — Progress Notes (Signed)
Cardiology Office Note   Date:  07/25/2017   ID:  [name] Barrenatricia A Profit, DOB 01/17/1945, MRN [id number]  PCP:  Shirlean MylarWebb, Carol, MD  Cardiologist:   Chilton Siiffany , MD   No chief complaint on file.     History of Present Illness: [name] [name] is a 72 y.o. female with non-obstructive CAD, hypertension, hyperlipiemia, GERD, and COPD here for follow up. She was initially seen for the evaluation of chest tightness.  Her symptoms began 10/2016.  She saw Dr. Delton CoombesByrum and reported shortness of breath and wheezing.  She was started on prednisone and Azithromycin.  [facility]/2016 due to chest tightness.  EKG revealed sinus rhythm and no ischemic changes.  There was concern that this was still a COPD exacerbation and prednisone was resumed.  She was referred for coronary CT-a 02/06/2017 that revealed moderate, diffuse calcification and atherosclerosis predominantly in the descending thoracic aorta.  She also had 25 to 50% left main disease with 25%-50% LAD disease with concern for an intramyocardial bridge.  There is also a greater than 70% left circumflex disease.  She was referred for left heart catheterization 02/09/2017 that revealed 10 to 40% nonobstructive disease.  No left main stenosis was noted.  Since that time she has been feeling well.  She has not had any chest pain.  She does get short of breath because of the 95 degree temperatures.  While inside she feels well.  She uses 2 to 3 L of oxygen with ambulation or when she goes in the heat.  She denies lower extremity edema, orthopnea, or PND.   Past Medical History:  Diagnosis Date  . Allergic rhinitis, cause unspecified   . CAD in native artery 07/25/2017  . Chronic airway obstruction, not elsewhere classified   . Depressive disorder, not elsewhere classified   . Nephritis and nephropathy, not specified as acute or chronic, with unspecified pathological lesion in kidney   . Obesity, unspecified   . Other and  unspecified hyperlipidemia   . Thyrotoxicosis without mention of goiter or other cause, without mention of thyrotoxic crisis or storm   . Unspecified essential hypertension     Past Surgical History:  Procedure Laterality Date  . LEFT HEART CATH AND CORONARY ANGIOGRAPHY N/A 03/01/2017   Procedure: LEFT HEART CATH AND CORONARY ANGIOGRAPHY;  Surgeon: Yvonne KendallEnd, Christopher, MD;  Location: [facility];  Service: Cardiovascular;  Laterality: N/A;     Current Outpatient Medications  Medication Sig Dispense Refill  . albuterol (PROAIR HFA) 108 (90 Base) MCG/ACT inhaler INHALE 2 PUFFS INTO THE LUNGS EVERY 6 (SIX) HOURS AS NEEDED FOR WHEEZING OR SHORTNESS OF BREATH. 3 Inhaler 1  . albuterol (PROVENTIL) (2.5 MG/3ML) 0.083% nebulizer solution Take 3 mLs (2.5 mg total) by nebulization every 4 (four) hours as needed for wheezing or shortness of breath (DX: COPD 496). 150 mL 12  . Ascorbic Acid (VITAMIN C) 1000 MG tablet Take 1,000 mg by mouth daily.      Marland Kitchen. aspirin 81 MG chewable tablet Chew 81 mg by mouth at bedtime.     Marland Kitchen. atenolol (TENORMIN) 50 MG tablet Take 50 mg by mouth daily.      Marland Kitchen. atorvastatin (LIPITOR) 20 MG tablet Take 20 mg by mouth daily.      . Calcium Carbonate-Vitamin D (CALTRATE 600+D) 600-400 MG-UNIT per tablet Take 2 tablets by mouth daily.      . fexofenadine (ALLEGRA) 180 MG tablet Take 180 mg by mouth  daily.    . fluticasone (FLONASE) 50 MCG/ACT nasal spray Place 2 sprays into both nostrils daily. (Patient taking differently: Place 2 sprays into both nostrils daily as needed for allergies or rhinitis. ) 16 g 2  . levothyroxine (SYNTHROID, LEVOTHROID) 100 MCG tablet Take 100 mcg by mouth daily.    Marland Kitchen losartan (COZAAR) 25 MG tablet Take 25 mg by mouth daily.    . metFORMIN (GLUCOPHAGE-XR) 500 MG 24 hr tablet Take 1 tablet (500 mg total) by mouth daily.  3  . Multiple Vitamins-Minerals (CENTRUM PO) Take 1 tablet by mouth daily.      . naproxen sodium (ALEVE) 220 MG tablet Take 220 mg  by mouth daily as needed (pain).    Marland Kitchen omeprazole (PRILOSEC) 20 MG capsule Take 1 capsule (20 mg total) by mouth daily. 30 capsule 11  . ONE TOUCH ULTRA TEST test strip As directed    . predniSONE (DELTASONE) 10 MG tablet Take 4 tablets for 3 days, 3 tablets for 3 days, 2 tablets for 3 days, 1 tablet for 3 days 30 tablet 0  . tiotropium (SPIRIVA) 18 MCG inhalation capsule Place 18 mcg into inhaler and inhale daily.      . Tiotropium Bromide-Olodaterol (STIOLTO RESPIMAT) 2.5-2.5 MCG/ACT AERS Inhale 2 puffs into the lungs daily. 3 Inhaler 1  . traZODone (DESYREL) 50 MG tablet Take 50 mg by mouth at bedtime.     Marland Kitchen venlafaxine XR (EFFEXOR-XR) 150 MG 24 hr capsule Take 1 capsule by mouth daily.    . nitroGLYCERIN (NITROSTAT) 0.4 MG SL tablet Place 1 tablet (0.4 mg total) under the tongue every 5 (five) minutes as needed for chest pain. 25 tablet 4   No current facility-administered medications for this visit.     Allergies:   Lisinopril and Sulfonamide derivatives    Social History:  The patient  reports that she quit smoking about 23 years ago. She has a 60.00 pack-year smoking history. She has never used smokeless tobacco. She reports that she does not drink alcohol.   Family History:  The patient's family history includes CAD in her mother; Cancer in her brother; Diabetes in her brother and mother; Heart failure in her mother; Hyperlipidemia in her mother; Hypertension in her mother; Stroke in her maternal grandfather.    ROS:  Please see the history of present illness.   Otherwise, review of systems are positive for none.   All other systems are reviewed and negative.    PHYSICAL EXAM: VS:  BP (!) 102/56   Pulse (!) 107   Ht 5' (1.524 m)   Wt 160 lb (72.6 kg)   SpO2 97%   BMI 31.25 kg/m  , BMI Body mass index is 31.25 kg/m. GENERAL:  Well appearing.  No acute distress. HEENT:  Pupils equal round and reactive, fundi not visualized, oral mucosa unremarkable NECK:  No jugular venous  distention, waveform within normal limits, carotid upstroke brisk and symmetric, no bruits, no thyromegaly LUNGS:  Mild expiratory wheezing diffusely.  HEART:  RRR.  PMI not displaced or sustained,S1 and S2 within normal limits, no S3, no S4, no clicks, no rubs, no murmurs ABD:  Flat, positive bowel sounds normal in frequency in pitch, no bruits, no rebound, no guarding, no midline pulsatile mass, no hepatomegaly, no splenomegaly EXT:  2 plus pulses throughout, no edema, no cyanosis no clubbing SKIN:  No rashes no nodules NEURO:  Cranial nerves II through XII grossly intact, motor grossly intact throughout PSYCH:  Cognitively intact,  oriented to person place and time   EKG:  EKG is not ordered today. 11/20/16: Sinus rhythm.  Rate 90 bpm.   Recent Labs: 02/21/2017: BUN 7; Creatinine, Ser 0.84; Hemoglobin 12.5; Platelets 252; Potassium 4.3; Sodium 146    06/20/16: Sodium 141, potassium 4.2, BUN 10, creatinine 0.95 AST 25, ALT 23 Hemoglobin A1c 6.7% Total cholesterol 130, triglycerides 158 HDL 43 LDL 55 TSH 0.96  07/03/2017: Total cholesterol 140, triglycerides 243, HDL 36, LDL 56 Exline hemoglobin A1c 7.4% Hemoglobin 12.5 Potassium 4.5, creatinine 0.93 TSH 2.17   Lipid Panel No results found for: CHOL, TRIG, HDL, CHOLHDL, VLDL, LDLCALC, LDLDIRECT    Wt Readings from Last 3 Encounters:  07/25/17 160 lb (72.6 kg)  07/02/17 163 lb (73.9 kg)  05/21/17 166 lb (75.3 kg)      ASSESSMENT AND PLAN:  # Non-obstructive CAD: No active symptoms.  LDL 56 06/2017.  Continue aspirin, atenolol, and atorvastatin.   # Hypertension:  Blood pressure well-controlled on atenolol and losartan. Creatinine/K+ are stable.  No changes.   # Hyperlipidemia:  Continue atorvastatin. LDL 56 06/2017.    Current medicines are reviewed at length with the patient today.  The patient does not have concerns regarding medicines.  The following changes have been made:  no change  Labs/ tests ordered today  include:   No orders of the defined types were placed in this encounter.    Disposition:   FU with Arnet Hofferber C. Duke Salvia, MD, [facility] in one year.     Signed, Han Lysne C. Duke Salvia, MD, [facility]  07/25/2017 10:21 AM    [facility]

## 2017-07-26 ENCOUNTER — Other Ambulatory Visit: Payer: Self-pay

## 2017-07-26 ENCOUNTER — Inpatient Hospital Stay (HOSPITAL_COMMUNITY)
Admission: EM | Admit: 2017-07-26 | Discharge: 2017-07-31 | DRG: 175 | Disposition: A | Discharge status: Home / Self Care | Payer: Medicare HMO | Attending: Internal Medicine | Admitting: Internal Medicine

## 2017-07-26 ENCOUNTER — Encounter (HOSPITAL_COMMUNITY): Payer: Self-pay | Admitting: Emergency Medicine

## 2017-07-26 ENCOUNTER — Emergency Department (HOSPITAL_COMMUNITY): Payer: Medicare HMO

## 2017-07-26 DIAGNOSIS — Z9981 Dependence on supplemental oxygen: Secondary | ICD-10-CM

## 2017-07-26 DIAGNOSIS — R55 Syncope and collapse: Secondary | ICD-10-CM | POA: Diagnosis not present

## 2017-07-26 DIAGNOSIS — J449 Chronic obstructive pulmonary disease, unspecified: Secondary | ICD-10-CM | POA: Diagnosis present

## 2017-07-26 DIAGNOSIS — Z823 Family history of stroke: Secondary | ICD-10-CM

## 2017-07-26 DIAGNOSIS — Z833 Family history of diabetes mellitus: Secondary | ICD-10-CM

## 2017-07-26 DIAGNOSIS — Z791 Long term (current) use of non-steroidal anti-inflammatories (NSAID): Secondary | ICD-10-CM | POA: Diagnosis not present

## 2017-07-26 DIAGNOSIS — Z8249 Family history of ischemic heart disease and other diseases of the circulatory system: Secondary | ICD-10-CM | POA: Diagnosis not present

## 2017-07-26 DIAGNOSIS — I10 Essential (primary) hypertension: Secondary | ICD-10-CM | POA: Diagnosis present

## 2017-07-26 DIAGNOSIS — J9811 Atelectasis: Secondary | ICD-10-CM | POA: Diagnosis present

## 2017-07-26 DIAGNOSIS — J440 Chronic obstructive pulmonary disease with acute lower respiratory infection: Secondary | ICD-10-CM | POA: Diagnosis present

## 2017-07-26 DIAGNOSIS — E039 Hypothyroidism, unspecified: Secondary | ICD-10-CM | POA: Diagnosis present

## 2017-07-26 DIAGNOSIS — Z7982 Long term (current) use of aspirin: Secondary | ICD-10-CM

## 2017-07-26 DIAGNOSIS — I2601 Septic pulmonary embolism with acute cor pulmonale: Secondary | ICD-10-CM | POA: Diagnosis not present

## 2017-07-26 DIAGNOSIS — J189 Pneumonia, unspecified organism: Secondary | ICD-10-CM | POA: Diagnosis not present

## 2017-07-26 DIAGNOSIS — Z87891 Personal history of nicotine dependence: Secondary | ICD-10-CM | POA: Diagnosis not present

## 2017-07-26 DIAGNOSIS — J9621 Acute and chronic respiratory failure with hypoxia: Secondary | ICD-10-CM | POA: Diagnosis present

## 2017-07-26 DIAGNOSIS — Z6830 Body mass index (BMI) 30.0-30.9, adult: Secondary | ICD-10-CM

## 2017-07-26 DIAGNOSIS — S0011XA Contusion of right eyelid and periocular area, initial encounter: Secondary | ICD-10-CM | POA: Diagnosis present

## 2017-07-26 DIAGNOSIS — E119 Type 2 diabetes mellitus without complications: Secondary | ICD-10-CM

## 2017-07-26 DIAGNOSIS — E669 Obesity, unspecified: Secondary | ICD-10-CM | POA: Diagnosis present

## 2017-07-26 DIAGNOSIS — K219 Gastro-esophageal reflux disease without esophagitis: Secondary | ICD-10-CM | POA: Diagnosis present

## 2017-07-26 DIAGNOSIS — Z79899 Other long term (current) drug therapy: Secondary | ICD-10-CM

## 2017-07-26 DIAGNOSIS — R000 Tachycardia, unspecified: Secondary | ICD-10-CM | POA: Diagnosis present

## 2017-07-26 DIAGNOSIS — R079 Chest pain, unspecified: Secondary | ICD-10-CM | POA: Diagnosis not present

## 2017-07-26 DIAGNOSIS — I2510 Atherosclerotic heart disease of native coronary artery without angina pectoris: Secondary | ICD-10-CM | POA: Diagnosis present

## 2017-07-26 DIAGNOSIS — W19XXXA Unspecified fall, initial encounter: Secondary | ICD-10-CM | POA: Diagnosis present

## 2017-07-26 DIAGNOSIS — S0093XA Contusion of unspecified part of head, initial encounter: Secondary | ICD-10-CM | POA: Diagnosis not present

## 2017-07-26 DIAGNOSIS — J441 Chronic obstructive pulmonary disease with (acute) exacerbation: Secondary | ICD-10-CM | POA: Diagnosis present

## 2017-07-26 DIAGNOSIS — R05 Cough: Secondary | ICD-10-CM | POA: Diagnosis not present

## 2017-07-26 DIAGNOSIS — R4020 Unspecified coma: Secondary | ICD-10-CM | POA: Diagnosis not present

## 2017-07-26 DIAGNOSIS — R0602 Shortness of breath: Secondary | ICD-10-CM

## 2017-07-26 DIAGNOSIS — E7849 Other hyperlipidemia: Secondary | ICD-10-CM | POA: Diagnosis present

## 2017-07-26 DIAGNOSIS — R0902 Hypoxemia: Secondary | ICD-10-CM | POA: Diagnosis not present

## 2017-07-26 DIAGNOSIS — E0590 Thyrotoxicosis, unspecified without thyrotoxic crisis or storm: Secondary | ICD-10-CM | POA: Diagnosis present

## 2017-07-26 DIAGNOSIS — Z7984 Long term (current) use of oral hypoglycemic drugs: Secondary | ICD-10-CM

## 2017-07-26 DIAGNOSIS — I2699 Other pulmonary embolism without acute cor pulmonale: Principal | ICD-10-CM

## 2017-07-26 DIAGNOSIS — Z8349 Family history of other endocrine, nutritional and metabolic diseases: Secondary | ICD-10-CM

## 2017-07-26 DIAGNOSIS — Z79890 Hormone replacement therapy (postmenopausal): Secondary | ICD-10-CM

## 2017-07-26 DIAGNOSIS — S0083XA Contusion of other part of head, initial encounter: Secondary | ICD-10-CM | POA: Diagnosis present

## 2017-07-26 DIAGNOSIS — I361 Nonrheumatic tricuspid (valve) insufficiency: Secondary | ICD-10-CM | POA: Diagnosis not present

## 2017-07-26 DIAGNOSIS — Z793 Long term (current) use of hormonal contraceptives: Secondary | ICD-10-CM

## 2017-07-26 DIAGNOSIS — E1121 Type 2 diabetes mellitus with diabetic nephropathy: Secondary | ICD-10-CM | POA: Diagnosis not present

## 2017-07-26 DIAGNOSIS — I2602 Saddle embolus of pulmonary artery with acute cor pulmonale: Secondary | ICD-10-CM | POA: Diagnosis not present

## 2017-07-26 LAB — TROPONIN I: Troponin I: <0.03 ng/mL (ref <0.03)

## 2017-07-26 LAB — URINALYSIS, ROUTINE W REFLEX MICROSCOPIC
Bacteria, UA: NONE SEEN
Bilirubin Urine: NEGATIVE
Glucose, UA: NEGATIVE mg/dL
Hgb urine dipstick: NEGATIVE
Ketones, ur: NEGATIVE mg/dL
Nitrite: NEGATIVE
Protein, ur: NEGATIVE mg/dL
Specific Gravity, Urine: 1.031 — ABNORMAL HIGH (ref 1.005–1.030)
pH: 6 (ref 5.0–8.0)

## 2017-07-26 LAB — CBG MONITORING, ED: Glucose-Capillary: 104 mg/dL — ABNORMAL HIGH (ref 70–99)

## 2017-07-26 LAB — D-DIMER, QUANTITATIVE: D-Dimer, Quant: 1.89 ug/mL-FEU — ABNORMAL HIGH (ref 0.00–0.50)

## 2017-07-26 LAB — BASIC METABOLIC PANEL
Anion gap: 10 (ref 5–15)
BUN: 11 mg/dL (ref 8–23)
CO2: 28 mmol/L (ref 22–32)
Calcium: 8.9 mg/dL (ref 8.9–10.3)
Chloride: 102 mmol/L (ref 98–111)
Creatinine, Ser: 1.02 mg/dL — ABNORMAL HIGH (ref 0.44–1.00)
GFR calc Af Amer: >60 mL/min (ref >60)
GFR calc non Af Amer: 54 mL/min — ABNORMAL LOW (ref >60)
Glucose, Bld: 116 mg/dL — ABNORMAL HIGH (ref 70–99)
Potassium: 4.5 mmol/L (ref 3.5–5.1)
Sodium: 140 mmol/L (ref 135–145)

## 2017-07-26 LAB — CBC
HCT: 38 % (ref 36.0–46.0)
Hemoglobin: 12.1 g/dL (ref 12.0–15.0)
MCH: 29.4 pg (ref 26.0–34.0)
MCHC: 31.8 g/dL (ref 30.0–36.0)
MCV: 92.5 fL (ref 78.0–100.0)
Platelets: 251 10*3/uL (ref 150–400)
RBC: 4.11 MIL/uL (ref 3.87–5.11)
RDW: 13.2 % (ref 11.5–15.5)
WBC: 11.9 10*3/uL — ABNORMAL HIGH (ref 4.0–10.5)

## 2017-07-26 MED ORDER — FLUTICASONE PROPIONATE 50 MCG/ACT NA SUSP
2.0000 | Freq: Every day | NASAL | Status: DC | PRN
Start: 2017-07-26 — End: 2017-07-31
  Filled 2017-07-26: qty 16

## 2017-07-26 MED ORDER — VENLAFAXINE HCL ER 75 MG PO CP24
150.0000 mg | ORAL_CAPSULE | Freq: Every day | ORAL | Status: DC
  Administered 2017-07-27 – 2017-07-31 (×5): 150 mg via ORAL
  Filled 2017-07-26 (×5): qty 2

## 2017-07-26 MED ORDER — ALBUTEROL SULFATE (2.5 MG/3ML) 0.083% IN NEBU
2.5000 mg | INHALATION_SOLUTION | RESPIRATORY_TRACT | Status: DC | PRN
  Administered 2017-07-29: 2.5 mg via RESPIRATORY_TRACT
  Filled 2017-07-26: qty 3

## 2017-07-26 MED ORDER — ATORVASTATIN CALCIUM 20 MG PO TABS
20.0000 mg | ORAL_TABLET | Freq: Every day | ORAL | Status: DC
  Administered 2017-07-26 – 2017-07-30 (×5): 20 mg via ORAL
  Filled 2017-07-26 (×5): qty 1

## 2017-07-26 MED ORDER — ACETAMINOPHEN 650 MG RE SUPP
650.0000 mg | Freq: Four times a day (QID) | RECTAL | Status: DC | PRN
Start: 2017-07-26 — End: 2017-07-31

## 2017-07-26 MED ORDER — INSULIN ASPART 100 UNIT/ML ~~LOC~~ SOLN
0.0000 [IU] | Freq: Three times a day (TID) | SUBCUTANEOUS | Status: DC
  Administered 2017-07-27: 2 [IU] via SUBCUTANEOUS
  Administered 2017-07-27: 1 [IU] via SUBCUTANEOUS
  Administered 2017-07-28: 2 [IU] via SUBCUTANEOUS
  Administered 2017-07-28 (×2): 1 [IU] via SUBCUTANEOUS
  Administered 2017-07-29 – 2017-07-31 (×7): 2 [IU] via SUBCUTANEOUS

## 2017-07-26 MED ORDER — HEPARIN BOLUS VIA INFUSION
3500.0000 [IU] | Freq: Once | INTRAVENOUS | Status: AC
  Administered 2017-07-26: 3500 [IU] via INTRAVENOUS
  Filled 2017-07-26: qty 3500

## 2017-07-26 MED ORDER — SODIUM CHLORIDE 0.9 % IV SOLN
500.0000 mg | INTRAVENOUS | Status: DC
  Administered 2017-07-27: 500 mg via INTRAVENOUS
  Filled 2017-07-26: qty 500

## 2017-07-26 MED ORDER — SODIUM CHLORIDE 0.9 % IV SOLN
1.0000 g | INTRAVENOUS | Status: DC
  Administered 2017-07-27: 1 g via INTRAVENOUS
  Filled 2017-07-26 (×2): qty 10

## 2017-07-26 MED ORDER — ONDANSETRON HCL 4 MG PO TABS: 4.0000 mg | ORAL_TABLET | Freq: Four times a day (QID) | ORAL | Status: DC | PRN

## 2017-07-26 MED ORDER — HEPARIN (PORCINE) IN NACL 100-0.45 UNIT/ML-% IJ SOLN
1300.0000 [IU]/h | INTRAMUSCULAR | Status: DC
  Administered 2017-07-26: 1000 [IU]/h via INTRAVENOUS
  Administered 2017-07-27 – 2017-07-28 (×3): 1300 [IU]/h via INTRAVENOUS
  Filled 2017-07-26 (×4): qty 250

## 2017-07-26 MED ORDER — SODIUM CHLORIDE 0.9 % IV BOLUS
500.0000 mL | Freq: Once | INTRAVENOUS | Status: AC
  Administered 2017-07-26: 500 mL via INTRAVENOUS

## 2017-07-26 MED ORDER — LEVOFLOXACIN IN D5W 500 MG/100ML IV SOLN
500.0000 mg | Freq: Once | INTRAVENOUS | Status: AC
  Administered 2017-07-26: 500 mg via INTRAVENOUS
  Filled 2017-07-26: qty 100

## 2017-07-26 MED ORDER — IPRATROPIUM-ALBUTEROL 0.5-2.5 (3) MG/3ML IN SOLN
3.0000 mL | Freq: Four times a day (QID) | RESPIRATORY_TRACT | Status: DC
  Administered 2017-07-27: 3 mL via RESPIRATORY_TRACT
  Filled 2017-07-26 (×2): qty 3

## 2017-07-26 MED ORDER — SODIUM CHLORIDE 0.9 % IV SOLN
INTRAVENOUS | Status: AC
  Administered 2017-07-26 – 2017-07-27 (×2): via INTRAVENOUS

## 2017-07-26 MED ORDER — ONDANSETRON HCL 4 MG/2ML IJ SOLN: 4.0000 mg | Freq: Four times a day (QID) | INTRAMUSCULAR | Status: DC | PRN

## 2017-07-26 MED ORDER — IOPAMIDOL (ISOVUE-370) INJECTION 76%
INTRAVENOUS | Status: AC
  Filled 2017-07-26: qty 100

## 2017-07-26 MED ORDER — ACETAMINOPHEN 325 MG PO TABS
650.0000 mg | ORAL_TABLET | Freq: Four times a day (QID) | ORAL | Status: DC | PRN
Start: 2017-07-26 — End: 2017-07-31
  Administered 2017-07-27 – 2017-07-28 (×3): 650 mg via ORAL
  Filled 2017-07-26 (×4): qty 2

## 2017-07-26 MED ORDER — HYDROCODONE-ACETAMINOPHEN 5-325 MG PO TABS: 1.0000 | ORAL_TABLET | ORAL | Status: DC | PRN

## 2017-07-26 MED ORDER — PANTOPRAZOLE SODIUM 40 MG PO TBEC
40.0000 mg | DELAYED_RELEASE_TABLET | Freq: Every day | ORAL | Status: DC
  Administered 2017-07-27 – 2017-07-31 (×5): 40 mg via ORAL
  Filled 2017-07-26 (×6): qty 1

## 2017-07-26 MED ORDER — ENSURE ENLIVE PO LIQD
237.0000 mL | Freq: Two times a day (BID) | ORAL | Status: DC
  Administered 2017-07-27: 237 mL via ORAL

## 2017-07-26 MED ORDER — LEVOTHYROXINE SODIUM 100 MCG PO TABS
100.0000 ug | ORAL_TABLET | Freq: Every day | ORAL | Status: DC
  Administered 2017-07-27 – 2017-07-31 (×5): 100 ug via ORAL
  Filled 2017-07-26 (×5): qty 1

## 2017-07-26 MED ORDER — IOPAMIDOL (ISOVUE-370) INJECTION 76%
100.0000 mL | Freq: Once | INTRAVENOUS | Status: AC | PRN
  Administered 2017-07-26: 100 mL via INTRAVENOUS

## 2017-07-26 MED ORDER — TRAZODONE HCL 50 MG PO TABS
50.0000 mg | ORAL_TABLET | Freq: Every day | ORAL | Status: DC
  Administered 2017-07-26 – 2017-07-30 (×5): 50 mg via ORAL
  Filled 2017-07-26 (×6): qty 1

## 2017-07-26 MED ORDER — POLYETHYLENE GLYCOL 3350 17 G PO PACK: 17.0000 g | PACK | Freq: Every day | ORAL | Status: DC | PRN

## 2017-07-26 NOTE — ED Provider Notes (Addendum)
[facility] EMERGENCY DEPARTMENT Provider Note   CSN: [id number] Arrival date & time: 07/26/17  1513     History   Chief Complaint Chief Complaint  Patient presents with  . Loss of Consciousness  . Cough    HPI MARSHEA WISHER is a 72 y.o. female.  CRITICAL CARE Performed by: Loren Racer Total critical care time: 30 minutes Critical care time was exclusive of separately billable procedures and treating other patients. Critical care was necessary to treat or prevent imminent or life-threatening deterioration. Critical care was time spent personally by me on the following activities: development of treatment plan with patient and/or surrogate as well as nursing, discussions with consultants, evaluation of patient's response to treatment, examination of patient, obtaining history from patient or surrogate, ordering and performing treatments and interventions, ordering and review of laboratory studies, ordering and review of radiographic studies, pulse oximetry and re-evaluation of patient's condition.    She states she has had several days of left-sided chest pain, shortness of breath and cough.  Occasionally producing yellow sputum.  No fever or chills.  This morning she stood up from a seated position and had a unwitnessed syncopal episode.  Unknown length of time she was unconscious.  She did strike her forehead sustaining a hematoma.  Denies headache or neck pain.  Denies any focal weakness or numbness.  States that the left-sided chest pain is worse with palpation and deep breathing.  She normally wears 3 L of oxygen.  No new lower extremity swelling or pain. Past Medical History:  Diagnosis Date  . Allergic rhinitis, cause unspecified   . CAD in native artery 07/25/2017  . Chronic airway obstruction, not elsewhere classified   . Depressive disorder, not elsewhere classified   . Nephritis and nephropathy, not specified as acute or chronic, with  unspecified pathological lesion in kidney   . Obesity, unspecified   . Other and unspecified hyperlipidemia   . Thyrotoxicosis without mention of goiter or other cause, without mention of thyrotoxic crisis or storm   . Unspecified essential hypertension     Patient Active Problem List   Diagnosis Date Noted  . CAD in native artery 07/25/2017  . Abnormal cardiac CT angiography 02/21/2017  . Chest pain with high risk of acute coronary syndrome 02/21/2017  . Non-insulin treated type 2 diabetes mellitus (HCC) 02/21/2017  . GERD (gastroesophageal reflux disease) 12/05/2016  . Vocal cord dysfunction 11/06/2014  . Cough 06/04/2013  . HYPERTHYROIDISM 10/31/2006  . Dyslipidemia 10/31/2006  . OBESITY 10/31/2006  . DEPRESSION 10/31/2006  . Essential hypertension 10/31/2006  . Allergic rhinitis 10/31/2006  . COPD (chronic obstructive pulmonary disease) (HCC) 10/31/2006  . GLOMERULONEPHRITIS 10/31/2006    Past Surgical History:  Procedure Laterality Date  . LEFT HEART CATH AND CORONARY ANGIOGRAPHY N/A 03/01/2017   Procedure: LEFT HEART CATH AND CORONARY ANGIOGRAPHY;  Surgeon: Yvonne Kendall, MD;  Location: [facility] INVASIVE CV LAB;  Service: Cardiovascular;  Laterality: N/A;     OB History   None      Home Medications    Prior to Admission medications   Medication Sig Start Date End Date Taking? Authorizing Provider  albuterol (PROAIR HFA) 108 (90 Base) MCG/ACT inhaler INHALE 2 PUFFS INTO THE LUNGS EVERY 6 (SIX) HOURS AS NEEDED FOR WHEEZING OR SHORTNESS OF BREATH. 05/21/17  Yes Byrum, Les Pou, MD  albuterol (PROVENTIL) (2.5 MG/3ML) 0.083% nebulizer solution Take 3 mLs (2.5 mg total) by nebulization every 4 (four) hours as needed for wheezing or shortness  of breath (DX: COPD 496). 06/04/13  Yes Leslye Peer, MD  Ascorbic Acid (VITAMIN C) 1000 MG tablet Take 1,000 mg by mouth daily.     Yes [provider]  aspirin 81 MG chewable tablet Chew 81 mg by mouth at bedtime.    Yes  [provider]  atenolol (TENORMIN) 50 MG tablet Take 50 mg by mouth daily.     Yes [provider]  atorvastatin (LIPITOR) 20 MG tablet Take 20 mg by mouth daily.     Yes [provider]  Calcium Carbonate-Vitamin D (CALTRATE 600+D) 600-400 MG-UNIT per tablet Take 2 tablets by mouth daily.     Yes [provider]  fexofenadine (ALLEGRA) 180 MG tablet Take 180 mg by mouth daily.   Yes [provider]  fluticasone (FLONASE) 50 MCG/ACT nasal spray Place 2 sprays into both nostrils daily. Patient taking differently: Place 2 sprays into both nostrils daily as needed for allergies or rhinitis.  06/25/14  Yes Leslye Peer, MD  levothyroxine (SYNTHROID, LEVOTHROID) 100 MCG tablet Take 100 mcg by mouth daily.   Yes [provider]  losartan (COZAAR) 25 MG tablet Take 25 mg by mouth daily.   Yes [provider]  metFORMIN (GLUCOPHAGE-XR) 500 MG 24 hr tablet Take 1 tablet (500 mg total) by mouth daily. Patient taking differently: Take 500 mg by mouth 2 (two) times daily.  03/04/17  Yes End, Cristal Deer, MD  Multiple Vitamins-Minerals (CENTRUM PO) Take 1 tablet by mouth daily.     Yes [provider]  naproxen sodium (ALEVE) 220 MG tablet Take 220 mg by mouth daily as needed (pain).   Yes [provider]  nitroGLYCERIN (NITROSTAT) 0.4 MG SL tablet Place 1 tablet (0.4 mg total) under the tongue every 5 (five) minutes as needed for chest pain. 02/21/17 07/26/17 Yes Kilroy, Eda Paschal, PA-C  omeprazole (PRILOSEC) 20 MG capsule Take 1 capsule (20 mg total) by mouth daily. 06/04/13  Yes Leslye Peer, MD  tiotropium (SPIRIVA) 18 MCG inhalation capsule Place 18 mcg into inhaler and inhale daily.     Yes [provider]  Tiotropium Bromide-Olodaterol (STIOLTO RESPIMAT) 2.5-2.5 MCG/ACT AERS Inhale 2 puffs into the lungs daily. 06/05/17  Yes Leslye Peer, MD  traZODone (DESYREL) 50 MG tablet Take 50 mg by mouth at bedtime.  02/28/13   Yes [provider]  venlafaxine XR (EFFEXOR-XR) 150 MG 24 hr capsule Take 1 capsule by mouth daily. 07/03/17  Yes [provider]  ONE TOUCH ULTRA TEST test strip As directed 05/18/11   [provider]    Family History Family History  Problem Relation Age of Onset  . Heart failure Mother   . Hyperlipidemia Mother   . Hypertension Mother   . Diabetes Mother   . CAD Mother   . Cancer Brother   . Stroke Maternal Grandfather   . Diabetes Brother     Social History Social History   Tobacco Use  . Smoking status: Former Smoker    Packs/day: 2.00    Years: 30.00    Pack years: 60.00    Last attempt to quit: 01/09/1994    Years since quitting: 23.5  . Smokeless tobacco: Never Used  Substance Use Topics  . Alcohol use: No  . Drug use: Never     Allergies   Lisinopril and Sulfonamide derivatives   Review of Systems Review of Systems  Constitutional: Negative for chills and fever.  HENT: Positive for facial swelling.  Negative for trouble swallowing.   Eyes: Negative for visual disturbance.  Respiratory: Positive for cough and shortness of breath.   Cardiovascular: Positive for chest pain. Negative for palpitations and leg swelling.  Gastrointestinal: Negative for abdominal pain, constipation, diarrhea, nausea and vomiting.  Genitourinary: Negative for dysuria, flank pain and frequency.  Musculoskeletal: Negative for back pain, myalgias, neck pain and neck stiffness.  Skin: Negative for rash and wound.  Neurological: Positive for syncope. Negative for dizziness, weakness, light-headedness, numbness and headaches.  All other systems reviewed and are negative.    Physical Exam Updated Vital Signs BP 90/62   Pulse 96   Temp 98.3 F (36.8 C) (Oral)   Resp (!) 21   SpO2 97%   Physical Exam  Constitutional: She is oriented to person, place, and time. She appears well-developed and well-nourished. No distress.  HENT:  Head: Normocephalic.    Mouth/Throat: Oropharynx is clear and moist.  Patient with hematoma to the right forehead.  Midface is stable.  No intraoral trauma.  Eyes: Pupils are equal, round, and reactive to light. EOM are normal.  Neck: Normal range of motion. Neck supple.  No posterior midline cervical tenderness to palpation.  Cardiovascular: Normal rate and regular rhythm. Exam reveals no gallop and no friction rub.  No murmur heard. Pulmonary/Chest: Effort normal. No stridor. No respiratory distress. She has wheezes. She has rales. She exhibits no tenderness.  No difficulty breathing.  She has crackles in bilateral bases and mild end expiratory wheezes.  Patient has reproducible tenderness to palpation over the left lateral chest wall.  No crepitance or deformity.  Abdominal: Soft. Bowel sounds are normal. There is no tenderness. There is no rebound and no guarding.  Musculoskeletal: Normal range of motion. She exhibits no edema or tenderness.  No lower extremity swelling, asymmetry or tenderness.  No midline thoracic or lumbar tenderness.  Neurological: She is alert and oriented to person, place, and time.  Moves all extremities without focal deficit.  Sensation intact.  Skin: Skin is warm and dry. Capillary refill takes less than 2 seconds. No rash noted. She is not diaphoretic. No erythema.  Psychiatric: She has a normal mood and affect. Her behavior is normal.  Nursing note and vitals reviewed.    ED Treatments / Results  Labs (all labs ordered are listed, but only abnormal results are displayed) Labs Reviewed  BASIC METABOLIC PANEL - Abnormal; Notable for the following components:      Result Value   Glucose, Bld 116 (*)    Creatinine, Ser 1.02 (*)    GFR calc non Af Amer 54 (*)    All other components within normal limits  CBC - Abnormal; Notable for the following components:   WBC 11.9 (*)    All other components within normal limits  D-DIMER, QUANTITATIVE (NOT AT [facility]) - Abnormal; Notable for  the following components:   D-Dimer, Quant 1.89 (*)    All other components within normal limits  CBG MONITORING, ED - Abnormal; Notable for the following components:   Glucose-Capillary 104 (*)    All other components within normal limits  TROPONIN I  URINALYSIS, ROUTINE W REFLEX MICROSCOPIC  HEPARIN LEVEL (UNFRACTIONATED)  CBC    EKG EKG Interpretation  Date/Time:  Thursday July 26 2017 15:26:07 EDT Ventricular Rate:  98 PR Interval:    QRS Duration: 73 QT Interval:  352 QTC Calculation: 450 R Axis:   73 Text Interpretation:  Sinus rhythm Low voltage, precordial leads Confirmed by Loren Racer (  [id number]) on 07/26/2017 6:49:50 PM   Radiology Ct Head Wo Contrast  Result Date: 07/26/2017 CLINICAL DATA:  Altered level of consciousness EXAM: CT HEAD WITHOUT CONTRAST TECHNIQUE: Contiguous axial images were obtained from the base of the skull through the vertex without intravenous contrast. COMPARISON:  None. FINDINGS: Brain: No acute intracranial abnormality. Specifically, no hemorrhage, hydrocephalus, mass lesion, acute infarction, or significant intracranial injury. Mild age related volume loss. Vascular: No hyperdense vessel or unexpected calcification. Skull: No acute calvarial abnormality. Sinuses/Orbits: Visualized paranasal sinuses and mastoids clear. Orbital soft tissues unremarkable. Other: None IMPRESSION: No acute intracranial abnormality. Electronically Signed   By: Charlett Nose M.D.   On: 07/26/2017 18:09   Ct Angio Chest Pe W And/or Wo Contrast  Result Date: 07/26/2017 CLINICAL DATA:  Shella Spearing the right forehead and possible pneumonia. Known witnessed syncopal episode last evening. EXAM: CT ANGIOGRAPHY CHEST WITH CONTRAST TECHNIQUE: Multidetector CT imaging of the chest was performed using the standard protocol during bolus administration of intravenous contrast. Multiplanar CT image reconstructions and MIPs were obtained to evaluate the vascular anatomy. CONTRAST:  60mL  ISOVUE-370 IOPAMIDOL (ISOVUE-370) INJECTION 76% COMPARISON:  04/08/2016 CXR, cardiac CT 02/06/2017 FINDINGS: Cardiovascular: Conventional branch pattern of the great vessels with atherosclerosis. Preferential opacification of the pulmonary arterial system, adequate for assessment to the level of the distal lobar and segmental arteries. Small pulmonary emboli to the lower lobes are noted within the distal lobar and proximal segmental pulmonary arteries. RV/LV ratio 1. No pericardial effusion. Atherosclerotic nonaneurysmal thoracic aorta. No dissection. Left main and three-vessel coronary arteriosclerosis. Mediastinum/Nodes: Small cluster of mildly enlarged left lower paratracheal lymph nodes measuring up to 13 mm short axis likely reactive. Small bilateral hilar lymph nodes without pathologic enlargement. Esophagus, trachea and mainstem bronchi are unremarkable. There is mild lower lobe peribronchial thickening. Lungs/Pleura: Pulmonary consolidation noted in the lingula suspicious for pneumonia and/or atelectasis. No effusion or pneumothorax. No dominant mass. Bilateral tree-in-bud like opacities in the lower lobes consistent with bronchiolitis. Subsegmental atelectasis in the periphery of the left lower lobe. Upper Abdomen: No acute abnormality. Musculoskeletal: No chest wall abnormality. No acute or significant osseous findings. Review of the MIP images confirms the above findings. IMPRESSION: 1. Bilateral lower lobe pulmonary emboli with mild right heart strain, RV/LV ratio 1. Positive for acute PE with CT evidence of right heart strain (RV/LV Ratio = 1) consistent with at least submassive (intermediate risk) PE. The presence of right heart strain has been associated with an increased risk of morbidity and mortality. Please activate Code PE by paging [phone]. These results were called by telephone at the time of interpretation on 07/26/2017 at 6:21 pm to Dr. Loren Racer , who verbally acknowledged these  results. 2. Nonaneurysmal atherosclerotic aorta without dissection. 3. Left main and three-vessel coronary arteriosclerosis. 4. Pulmonary consolidation in the lingula suspicious for pneumonia with reactive mediastinal adenopathy. 5. Tree-in-bud opacities in the lower lobes be keeping with bronchiolitis. Aortic Atherosclerosis (ICD10-I70.0). Electronically Signed   By: Tollie Eth M.D.   On: 07/26/2017 18:23    Procedures Procedures (including critical care time)  Medications Ordered in ED Medications  iopamidol (ISOVUE-370) 76 % injection (has no administration in time range)  levofloxacin (LEVAQUIN) IVPB 500 mg (has no administration in time range)  sodium chloride 0.9 % bolus 500 mL (has no administration in time range)  heparin ADULT infusion 100 units/mL (25000 units/249mL sodium chloride 0.45%) (has no administration in time range)  heparin bolus via infusion 3,500 Units (has no administration in time range)  iopamidol (ISOVUE-370) 76 % injection 100 mL (100 mLs Intravenous Contrast Given 07/26/17 1740)     Initial Impression / Assessment and Plan / ED Course  I have reviewed the triage vital signs and the nursing notes.  Pertinent labs & imaging results that were available during my care of the patient were reviewed by me and considered in my medical decision making (see chart for details).    Patient blood pressure remained stable.  Did need increased oxygen to maintain saturation greater than 90%.  CT with evidence of lingular pneumonia as well as bilateral PEs and evidence of right heart strain.  Discussed with intensivist and does not believe the patient would benefit from thrombin lytics at this point.  Advised to have hospitalist see patient and if patient deteriorates, reconsider thrombolytics.  Started on heparin per pharmacy consult.  Also started on IV biotics for me acquired pneumonia.  Discussed with hospitalist will see patient in the emergency department and  admit.   Final Clinical Impressions(s) / ED Diagnoses   Final diagnoses:  Bilateral pulmonary embolism (HCC)  Community acquired pneumonia, unspecified laterality    ED Discharge Orders    None       Loren RacerYelverton, Daemyn Gariepy, MD 07/26/17 1850    Loren RacerYelverton, Siria Calandro, MD 09/27/17 [phone]

## 2017-07-26 NOTE — H&P (Signed)
History and Physical    [name] [name] ZOX:[id number] DOB: March 03, 1945 DOA: 07/26/2017  PCP: [name] Mylar, MD   Patient coming from: Home   Chief Complaint: SOB, cough, loss of consciousness  HPI: [name] [name] is a 72 y.o. female with medical history significant for thyrotoxicosis, HTN, COPD on chronic 2-3 L O2, DM, who presented to the ED with complaints of cough that started 3 days ago-nonproductive.  Yesterday she started having difficulty breathing, and pain on the left side of her back/side of her chest.  This morning patient was sitting down to about 6:30 AM attempted to get up, and suddenly passed out, for an unknown period of time.  She denies any prodromal symptoms.  Endorses good p.o. Intake, no vomiting, no loose stools, no dizziness.  She endorses shaking chills this morning, denies fevers.  Patient denies personal or family history of blood clots.  She denies redness swelling or pain in her lower extremities.  She reports she is moderately active. Quit smoking cigarettes ~30 yrs ago.  Patient presented to her PCPs office this morning and was subsequently sent to the ED via EMS.  ED Course: Blood pressure systolic lowest 90/62, initial tachycardia 101, O2 sats 87 to 99%, initially requiring up to 5 L O2.  WBC mildly elevated 11.9.  Troponin 0.03.  Elevated d-dimer 1.89.  Head CT negative for acute abnormality.  CTA chest-bilateral lower lobe PE with mild right heart strain, pulmonary consolidation in the lingula suspicious for pneumonia with reactive mediastinal adenopathy.  Patient was started on IV heparin, IV Levaquin in the ED. bolus given in ED. Critical care consulted-patient does not need thrombolytics will need ICU admission at this time.  Hospitalist called to admit.   Review of Systems: As per HPI otherwise 10 point review of systems negative.  CONSTITUTIONAL- No Fever, or change in appetite. SKIN- No Rash, colour changes or itching. HEAD- No Headache or  dizziness. Mouth/throat- No Sorethroat, dentures, or bleeding gums. RESPIRATORY- No Cough or SOB. CARDIAC- No Palpitations or chest pain. GI- No nausea, vomiting, diarrhea or abd pain. URINARY- No Frequency, or dysuria. NEUROLOGIC- No Numbness seizures. [facility]- Denies depression or anxiety.  Past Medical History:  Diagnosis Date  . Allergic rhinitis, cause unspecified   . CAD in native artery 07/25/2017  . Chronic airway obstruction, not elsewhere classified   . Depressive disorder, not elsewhere classified   . Nephritis and nephropathy, not specified as acute or chronic, with unspecified pathological lesion in kidney   . Obesity, unspecified   . Other and unspecified hyperlipidemia   . Thyrotoxicosis without mention of goiter or other cause, without mention of thyrotoxic crisis or storm   . Unspecified essential hypertension     Past Surgical History:  Procedure Laterality Date  . LEFT HEART CATH AND CORONARY ANGIOGRAPHY N/A 03/01/2017   Procedure: LEFT HEART CATH AND CORONARY ANGIOGRAPHY;  Surgeon: Yvonne Kendall, MD;  Location: [facility];  Service: Cardiovascular;  Laterality: N/A;    reports that she quit smoking about 23 years ago. She has a 60.00 pack-year smoking history. She has never used smokeless tobacco. She reports that she does not drink alcohol or use drugs.  Allergies  Allergen Reactions  . Lisinopril     cough  . Sulfonamide Derivatives Rash    Family History  Problem Relation Age of Onset  . Heart failure Mother   . Hyperlipidemia Mother   . Hypertension Mother   . Diabetes Mother   . CAD Mother   .  Cancer Brother   . Stroke Maternal Grandfather   . Diabetes Brother     Prior to Admission medications   Medication Sig Start Date End Date Taking? Authorizing Provider  albuterol (PROAIR HFA) 108 (90 Base) MCG/ACT inhaler INHALE 2 PUFFS INTO THE LUNGS EVERY 6 (SIX) HOURS AS NEEDED FOR WHEEZING OR SHORTNESS OF BREATH. 05/21/17  Yes Byrum, Les Pou, MD  albuterol (PROVENTIL) (2.5 MG/3ML) 0.083% nebulizer solution Take 3 mLs (2.5 mg total) by nebulization every 4 (four) hours as needed for wheezing or shortness of breath (DX: COPD 496). 06/04/13  Yes Leslye Peer, MD  Ascorbic Acid (VITAMIN C) 1000 MG tablet Take 1,000 mg by mouth daily.     Yes [provider]  aspirin 81 MG chewable tablet Chew 81 mg by mouth at bedtime.    Yes [provider]  atenolol (TENORMIN) 50 MG tablet Take 50 mg by mouth daily.     Yes [provider]  atorvastatin (LIPITOR) 20 MG tablet Take 20 mg by mouth daily.     Yes [provider]  Calcium Carbonate-Vitamin D (CALTRATE 600+D) 600-400 MG-UNIT per tablet Take 2 tablets by mouth daily.     Yes [provider]  fexofenadine (ALLEGRA) 180 MG tablet Take 180 mg by mouth daily.   Yes [provider]  fluticasone (FLONASE) 50 MCG/ACT nasal spray Place 2 sprays into both nostrils daily. Patient taking differently: Place 2 sprays into both nostrils daily as needed for allergies or rhinitis.  06/25/14  Yes Leslye Peer, MD  levothyroxine (SYNTHROID, LEVOTHROID) 100 MCG tablet Take 100 mcg by mouth daily.   Yes [provider]  losartan (COZAAR) 25 MG tablet Take 25 mg by mouth daily.   Yes [provider]  metFORMIN (GLUCOPHAGE-XR) 500 MG 24 hr tablet Take 1 tablet (500 mg total) by mouth daily. Patient taking differently: Take 500 mg by mouth 2 (two) times daily.  03/04/17  Yes End, Cristal Deer, MD  Multiple Vitamins-Minerals (CENTRUM PO) Take 1 tablet by mouth daily.     Yes [provider]  naproxen sodium (ALEVE) 220 MG tablet Take 220 mg by mouth daily as needed (pain).   Yes [provider]  nitroGLYCERIN (NITROSTAT) 0.4 MG SL tablet Place 1 tablet (0.4 mg total) under the tongue every 5 (five) minutes as needed for chest pain. 02/21/17 07/26/17 Yes Kilroy, Eda Paschal, PA-C  omeprazole (PRILOSEC) 20 MG capsule Take 1 capsule  (20 mg total) by mouth daily. 06/04/13  Yes Leslye Peer, MD  tiotropium (SPIRIVA) 18 MCG inhalation capsule Place 18 mcg into inhaler and inhale daily.     Yes [provider]  Tiotropium Bromide-Olodaterol (STIOLTO RESPIMAT) 2.5-2.5 MCG/ACT AERS Inhale 2 puffs into the lungs daily. 06/05/17  Yes Leslye Peer, MD  traZODone (DESYREL) 50 MG tablet Take 50 mg by mouth at bedtime.  02/28/13  Yes [provider]  venlafaxine XR (EFFEXOR-XR) 150 MG 24 hr capsule Take 1 capsule by mouth daily. 07/03/17  Yes [provider]  ONE TOUCH ULTRA TEST test strip As directed 05/18/11   [provider]    Physical Exam: Vitals:   07/26/17 1600 07/26/17 1630 07/26/17 1700 07/26/17 1711  BP: 104/67 90/62    Pulse: 99 96 96 96  Resp: (!) 25 16 (!) 23 (!) 21  Temp:      TempSrc:      SpO2: 99% 98% (!) 87% 97%    Constitutional: NAD, calm, comfortable  Vitals:   07/26/17 1600 07/26/17 1630 07/26/17 1700 07/26/17 1711  BP: 104/67 90/62    Pulse: 99 96 96 96  Resp: (!) 25 16 (!) 23 (!) 21  Temp:      TempSrc:      SpO2: 99% 98% (!) 87% 97%   Eyes: PERRL, lids and conjunctivae normal ENMT: Mucous membranes are moist. Posterior pharynx clear of any exudate or lesions. Dentures. Neck: normal, supple, no masses, no thyromegaly Respiratory: reduced breath sounds globally with expiratory wheezes, No accessory muscle use.  Cardiovascular: Regular rate and rhythm, no murmurs / rubs / gallops. No extremity edema. 2+ pedal pulses. No carotid bruits.  Abdomen: no tenderness, no masses palpated. No hepatosplenomegaly. Bowel sounds positive.  Musculoskeletal: no clubbing / cyanosis. No joint deformity upper and lower extremities. Good ROM, no contractures. Normal muscle tone.  Skin: no rashes, lesions, ulcers. No induration Neurologic: CN 2-12 grossly intact. Strength 5/5 in all 4.  Psychiatric: Normal judgment and insight. Alert and oriented x 3. Normal mood.   Labs on  Admission: I have personally reviewed following labs and imaging studies  CBC: Recent Labs  Lab 07/26/17 1529  WBC 11.9*  HGB 12.1  HCT 38.0  MCV 92.5  PLT 251   Basic Metabolic Panel: Recent Labs  Lab 07/26/17 1529  NA 140  K 4.5  CL 102  CO2 28  GLUCOSE 116*  BUN 11  CREATININE 1.02*  CALCIUM 8.9   Cardiac Enzymes: Recent Labs  Lab 07/26/17 1529  TROPONINI <0.03   CBG: Recent Labs  Lab 07/26/17 1530  GLUCAP 104*   Radiological Exams on Admission: Ct Head Wo Contrast  Result Date: 07/26/2017 CLINICAL DATA:  Altered level of consciousness EXAM: CT HEAD WITHOUT CONTRAST TECHNIQUE: Contiguous axial images were obtained from the base of the skull through the vertex without intravenous contrast. COMPARISON:  None. FINDINGS: Brain: No acute intracranial abnormality. Specifically, no hemorrhage, hydrocephalus, mass lesion, acute infarction, or significant intracranial injury. Mild age related volume loss. Vascular: No hyperdense vessel or unexpected calcification. Skull: No acute calvarial abnormality. Sinuses/Orbits: Visualized paranasal sinuses and mastoids clear. Orbital soft tissues unremarkable. Other: None IMPRESSION: No acute intracranial abnormality. Electronically Signed   By: Charlett Nose M.D.   On: 07/26/2017 18:09   Ct Angio Chest Pe W And/or Wo Contrast  Result Date: 07/26/2017 CLINICAL DATA:  Shella Spearing the right forehead and possible pneumonia. Known witnessed syncopal episode last evening. EXAM: CT ANGIOGRAPHY CHEST WITH CONTRAST TECHNIQUE: Multidetector CT imaging of the chest was performed using the standard protocol during bolus administration of intravenous contrast. Multiplanar CT image reconstructions and MIPs were obtained to evaluate the vascular anatomy. CONTRAST:  60mL ISOVUE-370 IOPAMIDOL (ISOVUE-370) INJECTION 76% COMPARISON:  04/08/2016 CXR, cardiac CT 02/06/2017 FINDINGS: Cardiovascular: Conventional branch pattern of the great vessels with  atherosclerosis. Preferential opacification of the pulmonary arterial system, adequate for assessment to the level of the distal lobar and segmental arteries. Small pulmonary emboli to the lower lobes are noted within the distal lobar and proximal segmental pulmonary arteries. RV/LV ratio 1. No pericardial effusion. Atherosclerotic nonaneurysmal thoracic aorta. No dissection. Left main and three-vessel coronary arteriosclerosis. Mediastinum/Nodes: Small cluster of mildly enlarged left lower paratracheal lymph nodes measuring up to 13 mm short axis likely reactive. Small bilateral hilar lymph nodes without pathologic enlargement. Esophagus, trachea and mainstem bronchi are unremarkable. There is mild lower lobe peribronchial thickening. Lungs/Pleura: Pulmonary consolidation noted in the lingula suspicious for pneumonia and/or atelectasis. No effusion or pneumothorax. No dominant mass.  Bilateral tree-in-bud like opacities in the lower lobes consistent with bronchiolitis. Subsegmental atelectasis in the periphery of the left lower lobe. Upper Abdomen: No acute abnormality. Musculoskeletal: No chest wall abnormality. No acute or significant osseous findings. Review of the MIP images confirms the above findings. IMPRESSION: 1. Bilateral lower lobe pulmonary emboli with mild right heart strain, RV/LV ratio 1. Positive for acute PE with CT evidence of right heart strain (RV/LV Ratio = 1) consistent with at least submassive (intermediate risk) PE. The presence of right heart strain has been associated with an increased risk of morbidity and mortality. Please activate Code PE by paging [phone]. These results were called by telephone at the time of interpretation on 07/26/2017 at 6:21 pm to Dr. Loren RacerAVID YELVERTON , who verbally acknowledged these results. 2. Nonaneurysmal atherosclerotic aorta without dissection. 3. Left main and three-vessel coronary arteriosclerosis. 4. Pulmonary consolidation in the lingula suspicious for  pneumonia with reactive mediastinal adenopathy. 5. Tree-in-bud opacities in the lower lobes be keeping with bronchiolitis. Aortic Atherosclerosis (ICD10-I70.0). Electronically Signed   By: Tollie Ethavid  Kwon M.D.   On: 07/26/2017 18:23    EKG: Independently reviewed.  Sinus rhythm.  QTc 450.  No ST or T wave abnormalities  Assessment/Plan Principal Problem:   Pulmonary embolism (HCC) Active Problems:   Thyrotoxicosis   OBESITY   Essential hypertension   COPD (chronic obstructive pulmonary disease) (HCC)   Non-insulin treated type 2 diabetes mellitus (HCC)  Pulmonary embolism-appears unprovoked, likely causing syncope, increased SOB, with increased O2 demands, back/chest pain, mild hypotension-systolic 90s, improved after 500ml bolus. CTA-bilateral PE, with mild right heart strain. -Continue heparin drip pharmacy started in ED -Echocardiogram -Bilateral lower extremity Dopplers -IV fluids normal saline 100 cc/h x 10 hours -Will admit to stepdown -Neurochecks every 4 X 24hrs, considering recent fall, repeat head CT if alteration in mental status -   Pneumonia- Cough, chills.  WBC 11.9.  Chest CTA lingula consolidation with reactive mediastinal adenopathy, and bronchiolitis (see detailed report).  IV Levaquin started in ED -Continue CAP coverage with IV ceftriaxone and azithromycin -CBC a.m.  COPD-reduced air entry with expiratory wheezing. -Duo nebs every 6 hourly scheduled for now, PRN albuterol -Continue home fluticasone  HTN-soft blood pressure systolic 90s,  -Hold antihypertensives losartan and atenolol   Subcutaneous hematoma- right forehead. Not obvious on CT. S/p fall this a.m -Monitor for expansion, but patient needs anticoagulation.  DM-glucose 104. -Hold home medications Metformin - SSI-S  DVT prophylaxis: Heparin Code Status: Full Family Communication: None at bedside  Disposition Plan: Per rounding team Consults called: None  Admission status: inpt, step  down   Onnie BoerEjiroghene E Kateleen Encarnacion MD [facility] Pager [phone] From 6PM-2AM.  Otherwise please contact night-coverage [email or web address] Password [email or web address]  07/26/2017, 7:02 PM

## 2017-07-26 NOTE — ED Notes (Signed)
Pt CBG was 107, notified Brandy(RN)

## 2017-07-26 NOTE — ED Notes (Addendum)
Pt not transported to CT as of yet.

## 2017-07-26 NOTE — Progress Notes (Signed)
ANTICOAGULATION CONSULT NOTE  Pharmacy Consult for heparin Indication: pulmonary embolus  Patient Measurements: Heparin Dosing Weight: 61.6 kg  Vital Signs: Temp: 98.3 F (36.8 C) (07/18 1513) Temp Source: Oral (07/18 1513) BP: 90/62 (07/18 1630) Pulse Rate: 96 (07/18 1711)  Labs: Recent Labs    07/26/17 1529  HGB 12.1  HCT 38.0  PLT 251  CREATININE 1.02*  TROPONINI <0.03    Assessment 72 yo female admitted after having a syncopal episode last night. CTA is positive for bilateral lower lobe PE with mild R heart strain. Will start heparin. CBC and SCr ok.   Goal of Therapy:  Heparin level 0.3-0.7 units/ml Monitor platelets by anticoagulation protocol: Yes    Plan:  -Heparin 3700 units x1 then 1000 units/hr -Daily HL, CBC -Monitor s/sx bleeding -Check level in 8 hours    Baldemar FridayMasters, Mehr Depaoli M 07/26/2017,6:28 PM

## 2017-07-26 NOTE — ED Notes (Signed)
Patient transported to CT 

## 2017-07-26 NOTE — ED Triage Notes (Signed)
[facility] from pcp pt with c/o hematoma to the right forehead and possible pneumonia. She had a unwitnessed syncopal episode last night. Cough for a few days, lungs clear. Hx of CAD and wears 2-3 L home oxygen. A/O denies chx pain or SOB.   Vitals Stable   107/70 80 NSR 91% RA

## 2017-07-26 NOTE — ED Notes (Signed)
Patient returned from CT

## 2017-07-26 NOTE — ED Notes (Signed)
Admitting at bedside 

## 2017-07-26 NOTE — ED Notes (Signed)
ED Provider at bedside. 

## 2017-07-27 ENCOUNTER — Inpatient Hospital Stay (HOSPITAL_COMMUNITY): Payer: Medicare HMO

## 2017-07-27 DIAGNOSIS — R000 Tachycardia, unspecified: Secondary | ICD-10-CM

## 2017-07-27 DIAGNOSIS — I2699 Other pulmonary embolism without acute cor pulmonale: Secondary | ICD-10-CM

## 2017-07-27 DIAGNOSIS — I361 Nonrheumatic tricuspid (valve) insufficiency: Secondary | ICD-10-CM

## 2017-07-27 DIAGNOSIS — E119 Type 2 diabetes mellitus without complications: Secondary | ICD-10-CM

## 2017-07-27 DIAGNOSIS — J9621 Acute and chronic respiratory failure with hypoxia: Secondary | ICD-10-CM

## 2017-07-27 LAB — CBC
HCT: 39 % (ref 36.0–46.0)
Hemoglobin: 12 g/dL (ref 12.0–15.0)
MCH: 28.9 pg (ref 26.0–34.0)
MCHC: 30.8 g/dL (ref 30.0–36.0)
MCV: 94 fL (ref 78.0–100.0)
Platelets: 230 10*3/uL (ref 150–400)
RBC: 4.15 MIL/uL (ref 3.87–5.11)
RDW: 13.2 % (ref 11.5–15.5)
WBC: 10.7 10*3/uL — ABNORMAL HIGH (ref 4.0–10.5)

## 2017-07-27 LAB — ECHOCARDIOGRAM COMPLETE
Height: 61 in
Weight: 2617.3 oz

## 2017-07-27 LAB — GLUCOSE, CAPILLARY
Glucose-Capillary: 120 mg/dL — ABNORMAL HIGH (ref 70–99)
Glucose-Capillary: 140 mg/dL — ABNORMAL HIGH (ref 70–99)
Glucose-Capillary: 143 mg/dL — ABNORMAL HIGH (ref 70–99)
Glucose-Capillary: 156 mg/dL — ABNORMAL HIGH (ref 70–99)
Glucose-Capillary: 167 mg/dL — ABNORMAL HIGH (ref 70–99)

## 2017-07-27 LAB — MRSA PCR SCREENING: MRSA by PCR: NEGATIVE

## 2017-07-27 LAB — HEPARIN LEVEL (UNFRACTIONATED)
Heparin Unfractionated: 0.15 IU/mL — ABNORMAL LOW (ref 0.30–0.70)
Heparin Unfractionated: 0.12 IU/mL — ABNORMAL LOW (ref 0.30–0.70)
Heparin Unfractionated: 0.39 IU/mL (ref 0.30–0.70)

## 2017-07-27 LAB — TSH: TSH: 2.15 u[IU]/mL (ref 0.350–4.500)

## 2017-07-27 MED ORDER — GUAIFENESIN-DM 100-10 MG/5ML PO SYRP
10.0000 mL | ORAL_SOLUTION | Freq: Four times a day (QID) | ORAL | Status: DC | PRN
  Administered 2017-07-27 – 2017-07-28 (×4): 10 mL via ORAL
  Filled 2017-07-27 (×4): qty 10

## 2017-07-27 MED ORDER — IPRATROPIUM-ALBUTEROL 0.5-2.5 (3) MG/3ML IN SOLN
3.0000 mL | RESPIRATORY_TRACT | Status: DC
  Administered 2017-07-27 (×2): 3 mL via RESPIRATORY_TRACT
  Filled 2017-07-27 (×2): qty 3

## 2017-07-27 MED ORDER — HEPARIN BOLUS VIA INFUSION
2000.0000 [IU] | Freq: Once | INTRAVENOUS | Status: AC
  Administered 2017-07-27: 2000 [IU] via INTRAVENOUS
  Filled 2017-07-27: qty 2000

## 2017-07-27 MED ORDER — IPRATROPIUM-ALBUTEROL 0.5-2.5 (3) MG/3ML IN SOLN
3.0000 mL | Freq: Four times a day (QID) | RESPIRATORY_TRACT | Status: DC
  Administered 2017-07-27 – 2017-07-28 (×5): 3 mL via RESPIRATORY_TRACT
  Filled 2017-07-27 (×4): qty 3

## 2017-07-27 MED ORDER — FUROSEMIDE 10 MG/ML IJ SOLN
40.0000 mg | Freq: Once | INTRAMUSCULAR | Status: AC
  Administered 2017-07-27: 40 mg via INTRAVENOUS
  Filled 2017-07-27: qty 4

## 2017-07-27 MED ORDER — HEPARIN BOLUS VIA INFUSION
3000.0000 [IU] | Freq: Once | INTRAVENOUS | Status: AC
  Administered 2017-07-27: 3000 [IU] via INTRAVENOUS
  Filled 2017-07-27: qty 3000

## 2017-07-27 NOTE — Progress Notes (Signed)
PT Cancellation Note  Patient Details Name: [name] [name] MRN: [id number] DOB: 06/18/1945   Cancelled Treatment:    Reason Eval/Treat Not Completed: Other (comment). Pt with bilateral PE and heparin started yesterday at 1859. Plan for rebolus; awaiting heparin levels within therapeutic range. Will follow-up for PT evaluation.  Ina HomesJaclyn Halcyon Heck, PT, DPT Acute Rehab Services  Pager: [phone]  Malachy ChamberJaclyn L Kynzee Devinney 07/27/2017, 3:48 PM

## 2017-07-27 NOTE — Progress Notes (Signed)
@IPLOG @        PROGRESS NOTE                                                                                                                                                                                                             Patient Demographics:    [name] [name], is a 72 y.o. female, DOB - [birth year], ZOX:[id number]  Admit date - 07/26/2017   Admitting Physician Onnie Boer, MD  Outpatient Primary MD for the patient is Shirlean Mylar, MD  LOS - 1  Chief Complaint  Patient presents with  . Loss of Consciousness  . Cough       Brief Narrative   [name] [name] is a 72 y.o. female with medical history significant for thyrotoxicosis, HTN, COPD on chronic 2-3 L O2, DM, who presented to the ED with complaints of cough that started 3 days ago-nonproductive.  Yesterday she started having difficulty breathing, and pain on the left side of her back/side of her chest.  This morning patient was sitting down to about 6:30 AM attempted to get up, and suddenly passed out, for an unknown period of time.  Further work-up in the ER showed a large PE.     Subjective:    [name] [name] today has, No headache, No chest pain, No abdominal pain - No Nausea, No new weakness tingling or numbness, No Cough - improved SOB.    Assessment  & Plan :     1.  Acute on chronic hypoxic respiratory failure due to hemodynamically significant PE causing syncope.  Continue heparin drip, obtain echocardiogram and bilateral lower extremity venous duplex which have been ordered at the time of admission, continue close monitoring in stepdown.  2.  Possible pneumonia.  Mild leukocytosis, dry cough, CT evidence of infiltrate.  Continue empiric IV Rocephin and azithromycin.  Monitor clinically.  3.  COPD with chronic hypoxic respiratory failure on home oxygen.  Supportive care.  4.  DM type II.  Metformin on hold.  On sliding scale.  CBG (last 3)  Recent Labs    07/26/17 1530 07/27/17 0001  07/27/17 0721  GLUCAP 104* 140* 156*   5.  Mild tachycardia.  Likely due to PE but also has history of hypothyroidism.  Check TSH.  6. Hypothyroidism.  On Synthroid check TSH.  7.  Fall related right forehead hematoma.  Monitor.   Diet :  Diet Order           Diet Heart Room service appropriate? Yes; Fluid consistency: Thin  Diet effective now           Family Communication  :  None present  Code Status :  Full  Disposition Plan  :  TBD  Consults  :    Procedures  :   CTA -  IMPRESSION: 1. Bilateral lower lobe pulmonary emboli with mild right heart strain, RV/LV ratio 1. Positive for acute PE with CT evidence of right heart strain (RV/LV Ratio = 1) consistent with at least submassive (intermediate risk) PE. The presence of right heart strain has been associated with an increased risk of morbidity and mortality. Please activate Code PE by paging [phone]. These results were called by telephone at the time of interpretation on 07/26/2017 at 6:21 pm to Dr. Loren Racer , who verbally acknowledged these results. 2. Nonaneurysmal atherosclerotic aorta without dissection. 3. Left main and three-vessel coronary arteriosclerosis. 4. Pulmonary consolidation in the lingula suspicious for pneumonia with reactive mediastinal adenopathy. 5. Tree-in-bud opacities in the lower lobes be keeping with bronchiolitis  Lower extremity venous duplex.    TTE    DVT Prophylaxis  :   Heparin gtt  Lab Results  Component Value Date   PLT 230 07/27/2017    Inpatient Medications  Scheduled Meds: . atorvastatin  20 mg Oral QHS  . feeding supplement (ENSURE ENLIVE)  237 mL Oral BID BM  . insulin aspart  0-9 Units Subcutaneous TID WC  . ipratropium-albuterol  3 mL Nebulization Q4H  . levothyroxine  100 mcg Oral QAC breakfast  . pantoprazole  40 mg Oral Daily  . traZODone  50 mg Oral QHS  . venlafaxine XR  150 mg Oral Q breakfast   Continuous Infusions: . azithromycin    . cefTRIAXone  (ROCEPHIN)  IV    . heparin 1,150 Units/hr (07/27/17 0419)   PRN Meds:.acetaminophen **OR** acetaminophen, albuterol, fluticasone, HYDROcodone-acetaminophen, ondansetron **OR** ondansetron (ZOFRAN) IV, polyethylene glycol  Antibiotics  :    Anti-infectives (From admission, onward)   Start     Dose/Rate Route Frequency Ordered Stop   07/27/17 2200  azithromycin (ZITHROMAX) 500 mg in sodium chloride 0.9 % 250 mL IVPB     500 mg 250 mL/hr over 60 Minutes Intravenous Every 24 hours 07/26/17 2248     07/27/17 1400  cefTRIAXone (ROCEPHIN) 1 g in sodium chloride 0.9 % 100 mL IVPB     1 g 200 mL/hr over 30 Minutes Intravenous Every 24 hours 07/26/17 2248     07/26/17 1900  levofloxacin (LEVAQUIN) IVPB 500 mg     500 mg 100 mL/hr over 60 Minutes Intravenous  Once 07/26/17 1825 07/26/17 2033         Objective:   Vitals:   07/26/17 2351 07/27/17 0120 07/27/17 0404 07/27/17 0823  BP: 123/68  119/66   Pulse: (!) 106 100 (!) 103   Resp: (!) 31 (!) 26 (!) 28   Temp: 98 F (36.7 C)  98.7 F (37.1 C)   TempSrc: Oral  Oral   SpO2: 94% 98% 97% (!) 89%  Weight:      Height:        Wt Readings from Last 3 Encounters:  07/26/17 74.2 kg (163 lb 9.3 oz)  07/25/17 72.6 kg (160 lb)  07/02/17 73.9 kg (163 lb)     Intake/Output Summary (Last 24 hours) at 07/27/2017 1059 Last data filed at 07/27/2017 0900 Gross per 24 hour  Intake 1175.86 ml  Output 400 ml  Net 775.86 ml     Physical Exam  Awake Alert, Oriented X 3, No new F.N deficits, Normal affect [place], small right forehead hematoma stable,PERRAL Supple Neck,No JVD, No cervical lymphadenopathy appriciated.  Symmetrical Chest wall movement, Good air movement bilaterally, few rales RRR,No Gallops,Rubs or new Murmurs, No Parasternal Heave +ve B.Sounds, Abd Soft, No tenderness, No organomegaly appriciated, No rebound - guarding or rigidity. No Cyanosis, Clubbing or edema, No new Rash or bruise       Data Review:    CBC Recent Labs   Lab 07/26/17 1529 07/27/17 0302  WBC 11.9* 10.7*  HGB 12.1 12.0  HCT 38.0 39.0  PLT 251 230  MCV 92.5 94.0  MCH 29.4 28.9  MCHC 31.8 30.8  RDW 13.2 13.2    Chemistries  Recent Labs  Lab 07/26/17 1529  NA 140  K 4.5  CL 102  CO2 28  GLUCOSE 116*  BUN 11  CREATININE 1.02*  CALCIUM 8.9   ------------------------------------------------------------------------------------------------------------------ No results for input(s): CHOL, HDL, LDLCALC, TRIG, CHOLHDL, LDLDIRECT in the last 72 hours.  No results found for: HGBA1C ------------------------------------------------------------------------------------------------------------------ No results for input(s): TSH, T4TOTAL, T3FREE, THYROIDAB in the last 72 hours.  Invalid input(s): FREET3 ------------------------------------------------------------------------------------------------------------------ No results for input(s): VITAMINB12, FOLATE, FERRITIN, TIBC, IRON, RETICCTPCT in the last 72 hours.  Coagulation profile No results for input(s): INR, PROTIME in the last 168 hours.  Recent Labs    07/26/17 1529  DDIMER 1.89*    Cardiac Enzymes Recent Labs  Lab 07/26/17 1529  TROPONINI <0.03   ------------------------------------------------------------------------------------------------------------------ No results found for: BNP  Micro Results Recent Results (from the past 240 hour(s))  MRSA PCR Screening     Status: None   Collection Time: 07/26/17 10:39 PM  Result Value Ref Range Status   MRSA by PCR NEGATIVE NEGATIVE Final    Comment:        The GeneXpert MRSA Assay (FDA approved for NASAL specimens only), is one component of a comprehensive MRSA colonization surveillance program. It is not intended to diagnose MRSA infection nor to guide or monitor treatment for MRSA infections. Performed at [facility], [address] [place], [place] [address]     Radiology Reports Ct Head Wo  Contrast  Result Date: 07/26/2017 CLINICAL DATA:  Altered level of consciousness EXAM: CT HEAD WITHOUT CONTRAST TECHNIQUE: Contiguous axial images were obtained from the base of the skull through the vertex without intravenous contrast. COMPARISON:  None. FINDINGS: Brain: No acute intracranial abnormality. Specifically, no hemorrhage, hydrocephalus, mass lesion, acute infarction, or significant intracranial injury. Mild age related volume loss. Vascular: No hyperdense vessel or unexpected calcification. Skull: No acute calvarial abnormality. Sinuses/Orbits: Visualized paranasal sinuses and mastoids clear. Orbital soft tissues unremarkable. Other: None IMPRESSION: No acute intracranial abnormality. Electronically Signed   By: Charlett NoseKevin  Dover M.D.   On: 07/26/2017 18:09   Ct Angio Chest Pe W And/or Wo Contrast  Result Date: 07/26/2017 CLINICAL DATA:  Shella Spearinghoma the right forehead and possible pneumonia. Known witnessed syncopal episode last evening. EXAM: CT ANGIOGRAPHY CHEST WITH CONTRAST TECHNIQUE: Multidetector CT imaging of the chest was performed using the standard protocol during bolus administration of intravenous contrast. Multiplanar CT image reconstructions and MIPs were obtained to evaluate the vascular anatomy. CONTRAST:  60mL ISOVUE-370 IOPAMIDOL (ISOVUE-370) INJECTION 76% COMPARISON:  04/08/2016 CXR, cardiac CT 02/06/2017 FINDINGS: Cardiovascular: Conventional branch pattern of the great vessels with atherosclerosis. Preferential opacification of the pulmonary arterial system, adequate for assessment to the level of the distal lobar and segmental arteries. Small pulmonary emboli to the lower lobes are  noted within the distal lobar and proximal segmental pulmonary arteries. RV/LV ratio 1. No pericardial effusion. Atherosclerotic nonaneurysmal thoracic aorta. No dissection. Left main and three-vessel coronary arteriosclerosis. Mediastinum/Nodes: Small cluster of mildly enlarged left lower paratracheal lymph  nodes measuring up to 13 mm short axis likely reactive. Small bilateral hilar lymph nodes without pathologic enlargement. Esophagus, trachea and mainstem bronchi are unremarkable. There is mild lower lobe peribronchial thickening. Lungs/Pleura: Pulmonary consolidation noted in the lingula suspicious for pneumonia and/or atelectasis. No effusion or pneumothorax. No dominant mass. Bilateral tree-in-bud like opacities in the lower lobes consistent with bronchiolitis. Subsegmental atelectasis in the periphery of the left lower lobe. Upper Abdomen: No acute abnormality. Musculoskeletal: No chest wall abnormality. No acute or significant osseous findings. Review of the MIP images confirms the above findings. IMPRESSION: 1. Bilateral lower lobe pulmonary emboli with mild right heart strain, RV/LV ratio 1. Positive for acute PE with CT evidence of right heart strain (RV/LV Ratio = 1) consistent with at least submassive (intermediate risk) PE. The presence of right heart strain has been associated with an increased risk of morbidity and mortality. Please activate Code PE by paging [phone]. These results were called by telephone at the time of interpretation on 07/26/2017 at 6:21 pm to Dr. Loren Racer , who verbally acknowledged these results. 2. Nonaneurysmal atherosclerotic aorta without dissection. 3. Left main and three-vessel coronary arteriosclerosis. 4. Pulmonary consolidation in the lingula suspicious for pneumonia with reactive mediastinal adenopathy. 5. Tree-in-bud opacities in the lower lobes be keeping with bronchiolitis. Aortic Atherosclerosis (ICD10-I70.0). Electronically Signed   By: Tollie Eth M.D.   On: 07/26/2017 18:23    Time Spent in minutes  30   Susa Raring M.D on 07/27/2017 at 10:59 AM  Between 7am to 7pm - Pager - [phone] ( page via [email or web address], text pages only, please mention full 10 digit call back number). After 7pm go to [email or web address] - password [facility]

## 2017-07-27 NOTE — Progress Notes (Signed)
Initial Nutrition Assessment  DOCUMENTATION CODES:   Obesity unspecified  INTERVENTION:    Ensure Enlive po BID, each supplement provides 350 kcal and 20 grams of protein  NUTRITION DIAGNOSIS:   Increased nutrient needs related to acute illness as evidenced by estimated needs  GOAL:   Patient will meet greater than or equal to 90% of their needs  MONITOR:   PO intake, Supplement acceptance, Labs, Skin, Weight trends, I & O's  REASON FOR ASSESSMENT:   Malnutrition Screening Tool  ASSESSMENT:   72 y.o. Female with PMH significant for thyrotoxicosis, HTN, COPD on chronic 2-3 L O2, DM, who presented to the ED with complaints of cough that started 3 days ago-nonproductive.    Patient admitted for Bilateral pulmonary embolism (HCC) [I26.99] Community acquired pneumonia, unspecified laterality [J18.9]  RD spoke with pt in her room. On bedside commode. She had little of her breakfast this AM. Had a piece of french toast. Pt reports a decreased appetite for about one (1) week PTA.  Pt has Ensure Enlive supplement on her tray table. She likes them. Medications include levothyroxine. CBG's 140-156-120.  NUTRITION - FOCUSED PHYSICAL EXAM:  Completed. No muslce or fat depletions noticed.  Diet Order:   Diet Order           Diet Heart Room service appropriate? Yes; Fluid consistency: Thin  Diet effective now         EDUCATION NEEDS:   No education needs have been identified at this time  Skin:  Skin Assessment: Reviewed RN Assessment  Last BM:  7/18  Height:   Ht Readings from Last 1 Encounters:  07/26/17 5\' 1"  (1.549 m)   Weight:   Wt Readings from Last 1 Encounters:  07/26/17 163 lb 9.3 oz (74.2 kg)   BMI:  Body mass index is 30.91 kg/m.  Estimated Nutritional Needs:   Kcal:  1700-1900  Protein:  80-95 gm  Fluid:  1.7-1.9 L  Maureen ChattersKatie Darrnell Mangiaracina, RD, LDN Pager #: [phone] After-Hours Pager #: [phone]

## 2017-07-27 NOTE — Progress Notes (Signed)
ANTICOAGULATION CONSULT NOTE  Pharmacy Consult for Heparin Indication: pulmonary embolus  Patient Measurements: Heparin Dosing Weight: 61.6 kg  Vital Signs: Temp: 98.7 F (37.1 C) (07/19 0404) Temp Source: Oral (07/19 0404) BP: 119/66 (07/19 0404) Pulse Rate: 103 (07/19 0404)  Labs: Recent Labs    07/26/17 1529 07/27/17 0302  HGB 12.1 12.0  HCT 38.0 39.0  PLT 251 230  HEPARINUNFRC  --  0.15*  CREATININE 1.02*  --   TROPONINI <0.03  --     Assessment 72 yo female admitted after having a syncopal episode last night. CTA is positive for bilateral lower lobe PE with mild R heart strain. Will start heparin. CBC and SCr ok.  7/19 AM update: heparin level low, no issues per RN, Hgb stable  Goal of Therapy:  Heparin level 0.3-0.7 units/ml Monitor platelets by anticoagulation protocol: Yes   Plan:  -Heparin 2000 units BOLUS -Inc heparin to 1150 units/hr -1300 HL  Abran DukeJames Javen Ridings, PharmD, BCPS Clinical Pharmacist Phone: [phone]

## 2017-07-27 NOTE — Progress Notes (Signed)
  Echocardiogram 2D Echocardiogram has been performed.  [name] [name], Tala Eber 07/27/2017, 11:43 AM

## 2017-07-27 NOTE — Consult Note (Signed)
[facility] CM Primary Care Navigator  07/27/2017  [name] [name] 26-Mar-1945 [id number]   Met withpatientatthe bedside toidentify possible discharge needs. Patientreportsthatshe "blocked out and fell" thatresulted to thisadmission.(Acute on chronic hypoxic respiratory failure due to hemodynamically significant PE- pulmonary embolism causing syncope)  PatientendorsesDr. Maurice Small with [place] [facility] asherprimary care provider.   [facility] [employer]  and [employer] [facility] Delivery service toobtain medications without difficulty.    Patientreports thatshehas beenmanagingher ownmedications at [employer] use of "pill box" system filled once a week.  Patient states that she was driving prior to admission, but her friend Olegario Shearer) will be able to provide transportation to her doctors' appointments if need after discharge.  [employer] transportation benefits discussed with patient.  Patientreports living alone and independent with self care prior to admission. She verbalized being the caregiverfor herself at home. She mentioned that she could possibly stay at her friend's house if needed at discharge.  Discharge disposition isstill to be determined per MD note, but patient hopes to go home when ready.   Patient voiced understandingto callprimary care provider'soffice whenshereturnshome,for a post discharge follow-upvisitwithin1- 2 weeksor sooner if needs arise.Patient letter (with PCP's contact number) was provided asareminder.   Explained topatient regarding [facility] CM services available for health management/ resourcesat home but she had indicated being capable of managing her health issues at home so far. Patient is knowledgeable on ways to manage DM and COPD. COPD action plan reviewed with her. She expressedunderstandingto seekreferral from primary care provider to [facility] care management  ifdeemed necessary and appropriatefor anyservicesin the nearfuture.   [facility] care management information was provided for futureneeds thatshemay have.  Patient however, opted and verbally agreed to [facility] COPD calls to follow-up withherrecovery at home.  Referral made for [facility]MI COPD calls after discharge.   For additional questions please contact:  Edwena Felty A. Tiesha Marich, BSN, RN-BC [facility] PRIMARY CARE Navigator Cell: [phone]

## 2017-07-27 NOTE — Progress Notes (Addendum)
ANTICOAGULATION CONSULT NOTE  Pharmacy Consult for Heparin Indication: pulmonary embolus  Patient Measurements: Heparin Dosing Weight: 61.6 kg  Vital Signs: Temp: 98.8 F (37.1 C) (07/19 1202) Temp Source: Oral (07/19 1202) BP: 125/76 (07/19 1202) Pulse Rate: 109 (07/19 1202)  Labs: Recent Labs    07/26/17 1529 07/27/17 0302 07/27/17 1238  HGB 12.1 12.0  --   HCT 38.0 39.0  --   PLT 251 230  --   HEPARINUNFRC  --  0.15* 0.12*  CREATININE 1.02*  --   --   TROPONINI <0.03  --   --     Assessment 72 yo female admitted after having a syncopal episode- CTA is positive for bilateral lower lobe PE with mild R heart strain.  Patient started on IV heparin- first two levels have been low, most recently 012 units/mL. spoke with RN Marcelino DusterMichelle- no issues with line.  CBC is stable, no bleeding noted.  Goal of Therapy:  Heparin level 0.3-0.7 units/ml Monitor platelets by anticoagulation protocol: Yes   Plan:  Rebolus with 3000 units IV x1, then increase infusion to 1300 units/hr Recheck heparin level in 8 hours Daily heparin level and CBC Follow long term anticoagulation plans  Anasia Agro D. Jadynn Epping, PharmD, BCPS Clinical Pharmacist [phone] Please check AMION for all [facility] Pharm[facility] numbers 07/27/2017 2:08 PM

## 2017-07-27 NOTE — Progress Notes (Signed)
Bilateral lower extremity venous duplex completed. Preliminary results - There is no evidence of DVT or Baker's cyst. Toma DeitersVirginia Musette Kisamore, RVS 7/19.2019, 3:53 PM

## 2017-07-28 LAB — CBC
HCT: 36.2 % (ref 36.0–46.0)
Hemoglobin: 11 g/dL — ABNORMAL LOW (ref 12.0–15.0)
MCH: 28.5 pg (ref 26.0–34.0)
MCHC: 30.4 g/dL (ref 30.0–36.0)
MCV: 93.8 fL (ref 78.0–100.0)
Platelets: 245 10*3/uL (ref 150–400)
RBC: 3.86 MIL/uL — ABNORMAL LOW (ref 3.87–5.11)
RDW: 13.4 % (ref 11.5–15.5)
WBC: 9.1 10*3/uL (ref 4.0–10.5)

## 2017-07-28 LAB — GLUCOSE, CAPILLARY
Glucose-Capillary: 147 mg/dL — ABNORMAL HIGH (ref 70–99)
Glucose-Capillary: 155 mg/dL — ABNORMAL HIGH (ref 70–99)
Glucose-Capillary: 148 mg/dL — ABNORMAL HIGH (ref 70–99)
Glucose-Capillary: 175 mg/dL — ABNORMAL HIGH (ref 70–99)

## 2017-07-28 LAB — HEPARIN LEVEL (UNFRACTIONATED): Heparin Unfractionated: 0.34 IU/mL (ref 0.30–0.70)

## 2017-07-28 MED ORDER — AZITHROMYCIN 250 MG PO TABS
500.0000 mg | ORAL_TABLET | Freq: Every day | ORAL | Status: AC
  Administered 2017-07-28 – 2017-07-31 (×4): 500 mg via ORAL
  Filled 2017-07-28 (×5): qty 2

## 2017-07-28 MED ORDER — CEFDINIR 300 MG PO CAPS
300.0000 mg | ORAL_CAPSULE | Freq: Two times a day (BID) | ORAL | Status: AC
  Administered 2017-07-28 – 2017-07-31 (×7): 300 mg via ORAL
  Filled 2017-07-28 (×7): qty 1

## 2017-07-28 MED ORDER — RIVAROXABAN 15 MG PO TABS
15.0000 mg | ORAL_TABLET | Freq: Two times a day (BID) | ORAL | Status: DC
  Administered 2017-07-29 – 2017-07-31 (×5): 15 mg via ORAL
  Filled 2017-07-28 (×5): qty 1

## 2017-07-28 MED ORDER — CEFDINIR 300 MG PO CAPS
300.0000 mg | ORAL_CAPSULE | Freq: Two times a day (BID) | ORAL | Status: DC
  Filled 2017-07-28: qty 1

## 2017-07-28 NOTE — Plan of Care (Signed)
Patient needs continued reinforcement of the Incentive Spirometer.  Patient reminded to call for help to get up due to bleeding risk and to decrease risk of falls.  Patient is open to continued teaching and reinforcement regarding her disease process of pulmonary embolisms.

## 2017-07-28 NOTE — Progress Notes (Addendum)
ANTICOAGULATION CONSULT NOTE  Pharmacy Consult for Heparin Indication: pulmonary embolus  Patient Measurements: Heparin Dosing Weight: 61.6 kg  Vital Signs: Temp: 98.5 F (36.9 C) (07/20 0723) Temp Source: Oral (07/20 0723) BP: 125/66 (07/20 0723) Pulse Rate: 112 (07/20 0333)  Labs: Recent Labs    07/26/17 1529  07/27/17 0302 07/27/17 1238 07/27/17 2242 07/28/17 0255  HGB 12.1  --  12.0  --   --  11.0*  HCT 38.0  --  39.0  --   --  36.2  PLT 251  --  230  --   --  245  HEPARINUNFRC  --    < > 0.15* 0.12* 0.39 0.34  CREATININE 1.02*  --   --   --   --   --   TROPONINI <0.03  --   --   --   --   --    < > = values in this interval not displayed.    Assessment 72 yo female admitted after having a syncopal episode- CTA is positive for bilateral lower lobe PE with mild R heart strain.  Heparin level therapeutic today, on lower end of the range. Hgb 11, slight drop from yesterday. Platelets are stable, no bleeding noted.  Goal of Therapy:  Heparin level 0.3-0.7 units/ml Monitor platelets by anticoagulation protocol: Yes   Plan:  Increase heparin infusion to 1400 units/hr Recheck heparin level in 8 hours Daily heparin level and CBC Follow long term anticoagulation plans.  Ewing Schleinolton Leliana Kontz, PharmD PGY1 Pharmacy Resident 07/28/2017    8:17 AM   Addendum: Earlyne Ibaonsulted to transition to transition to Xarelto tomorrow am. Will not draw heparin level later today and stop heparin gtt tomorrow at first dose of Xarelto. For PE treatment, Xarelto 15mg  po bid with meals x21 days, then 20mg  daily. Educate patient prior to discharge. Pharmacy signing off but will continue to follow peripherally.  Ewing Schleinolton Trella Thurmond, PharmD PGY1 Pharmacy Resident 07/28/2017    10:58 AM

## 2017-07-28 NOTE — Evaluation (Signed)
Physical Therapy Evaluation Patient Details Name: [name] [name] MRN: [id number] DOB: 06/07/1945 Today's Date: 07/28/2017   History of Present Illness  Pt is a 72 y/o female thyrotoxicosis, HTN, COPD on chronic 2-3 L O2, DM, who presented to the ED with complaints of cough. Pt found to have bilateral PE with mild heart strain and started on heparin drip.    Clinical Impression  Pt presented sitting OOB in recliner chair, awake and willing to participate in therapy session. Prior to admission, pt reported that she was independent with all functional mobility and ADLs. Pt lives alone in an apartment and has family that can assist intermittently. Pt currently able to perform transfers with supervision and ambulate in hallway while pushing IV pole with min guard for safety and line management. Of note, pt's HR increased to 133 bpm with ambulation. Pt on 3L of O2 throughout with SPO2 maintaining in mid 90's. Pt's RN was notified. PT will continue to follow pt acutely to progress mobility as tolerated.      Follow Up Recommendations No PT follow up    Equipment Recommendations  None recommended by PT    Recommendations for Other Services       Precautions / Restrictions Precautions Precautions: Fall Precaution Comments: monitor SPO2 and HR Restrictions Weight Bearing Restrictions: No      Mobility  Bed Mobility               General bed mobility comments: pt OOB in recliner chair upon arrival  Transfers Overall transfer level: Needs assistance Equipment used: None Transfers: Sit to/from Stand Sit to Stand: Supervision         General transfer comment: supervision for safety  Ambulation/Gait Ambulation/Gait assistance: Min guard Gait Distance (Feet): 100 Feet Assistive device: IV Pole Gait Pattern/deviations: Step-through pattern;Decreased step length - right;Decreased step length - left;Decreased stride length Gait velocity: decreased Gait velocity  interpretation: 1.31 - 2.62 ft/sec, indicative of limited community ambulator General Gait Details: pt with mild instability but no overt LOB or need for physical assistance, min guard for safety and for line management. pt on 3L of O2 throughout with HR elevating to 133 bpm and SPO2 maintaining in mid 90's  Stairs            Wheelchair Mobility    Modified Rankin (Stroke Patients Only)       Balance Overall balance assessment: Needs assistance Sitting-balance support: Feet supported Sitting balance-Leahy Scale: Good     Standing balance support: During functional activity;No upper extremity supported Standing balance-Leahy Scale: Fair                               Pertinent Vitals/Pain Pain Assessment: No/denies pain    Home Living Family/patient expects to be discharged to:: Private residence Living Arrangements: Alone Available Help at Discharge: Family;Available PRN/intermittently Type of Home: Apartment Home Access: Level entry     Home Layout: One level Home Equipment: Tub bench      Prior Function Level of Independence: Independent               Hand Dominance        Extremity/Trunk Assessment   Upper Extremity Assessment Upper Extremity Assessment: Overall WFL for tasks assessed    Lower Extremity Assessment Lower Extremity Assessment: Overall WFL for tasks assessed       Communication   Communication: No difficulties  Cognition Arousal/Alertness: Awake/alert Behavior During Therapy: WFL for  tasks assessed/performed Overall Cognitive Status: Within Functional Limits for tasks assessed                                        General Comments      Exercises     Assessment/Plan    PT Assessment Patient needs continued PT services  PT Problem List Decreased activity tolerance;Decreased balance;Decreased mobility;Decreased coordination;Cardiopulmonary status limiting activity       PT Treatment  Interventions DME instruction;Gait training;Stair training;Functional mobility training;Therapeutic activities;Therapeutic exercise;Balance training;Neuromuscular re-education;Patient/family education    PT Goals (Current goals can be found in the Care Plan section)  Acute Rehab PT Goals Patient Stated Goal: return home ASAP PT Goal Formulation: With patient Time For Goal Achievement: 08/11/17 Potential to Achieve Goals: Good    Frequency Min 3X/week   Barriers to discharge        Co-evaluation               AM-PAC PT "6 Clicks" Daily Activity  Outcome Measure Difficulty turning over in bed (including adjusting bedclothes, sheets and blankets)?: A Little Difficulty moving from lying on back to sitting on the side of the bed? : A Little Difficulty sitting down on and standing up from a chair with arms (e.g., wheelchair, bedside commode, etc,.)?: A Little Help needed moving to and from a bed to chair (including a wheelchair)?: None Help needed walking in hospital room?: A Little Help needed climbing 3-5 steps with a railing? : A Little 6 Click Score: 19    End of Session Equipment Utilized During Treatment: Gait belt;Oxygen Activity Tolerance: Patient tolerated treatment well Patient left: in chair;with call bell/phone within reach;with chair alarm set Nurse Communication: Mobility status;Other (comment)(elevated HR with activity) PT Visit Diagnosis: Other abnormalities of gait and mobility (R26.89)    Time: [id number] PT Time Calculation (min) (ACUTE ONLY): 28 min   Charges:   PT Evaluation $PT Eval Moderate Complexity: 1 Mod PT Treatments $Gait Training: 8-22 mins   PT G Codes:        Bear Creek RanchJennifer Yesli Vanderhoff, PT, TennesseeDPT [phone]   Alessandra BevelsJennifer M [name] Seeber 07/28/2017, 2:51 PM

## 2017-07-28 NOTE — Progress Notes (Signed)
 @IPLOG @        PROGRESS NOTE                                                                                                                                                                                                             Patient Demographics:    [name] [name], is a 72 y.o. female, DOB - 05/20/1945, ZOX:[id number]  Admit date - 07/26/2017   Admitting Physician Onnie BoerEjiroghene E Emokpae, MD  Outpatient Primary MD for the patient is Shirlean MylarWebb, Carol, MD  LOS - 2  Chief Complaint  Patient presents with  . Loss of Consciousness  . Cough       Brief Narrative   [name] [name] is a 72 y.o. female with medical history significant for thyrotoxicosis, HTN, COPD on chronic 2-3 L O2, DM, who presented to the ED with complaints of cough that started 3 days ago-nonproductive.  Yesterday she started having difficulty breathing, and pain on the left side of her back/side of her chest.  This morning patient was sitting down to about 6:30 AM attempted to get up, and suddenly passed out, for an unknown period of time.  Further work-up in the ER showed a large PE.   Subjective:    Patient in bed, appears comfortable, denies any headache, no fever, no chest pain or pressure, no shortness of breath , no abdominal pain. No focal weakness.     Assessment  & Plan :     1.  Acute on chronic hypoxic respiratory failure due to hemodynamically significant PE causing syncope.  Based on heparin drip and now much improved, echocardiogram nonacute without any evidence of right heart strain, lower extremity venous duplex unremarkable, will continue heparin for another 24 hours due to large clot burden, will transition to Xarelto in the morning, advance activity, PT eval.  If stable discharge in the morning on Xarelto.  2.  Possible pneumonia.  Mild leukocytosis, dry cough, CT evidence of infiltrate.  She is on IV antibiotics and will transition to oral on 07/28/2017..  3.  COPD with chronic hypoxic  respiratory failure on home oxygen.  Supportive care.  4.  DM type II.  Metformin on hold.  On sliding scale.  CBG (last 3)  Recent Labs    07/27/17 1155 07/27/17 1622 07/27/17 2157  GLUCAP 120* 143* 167*   5.  Mild tachycardia.  Likely due to PE but also has history of hypothyroidism.  Stable TSH.  6. Hypothyroidism.  On Synthroid stable TSH.  7.  Fall related right  forehead hematoma.  Monitor.   Diet :  Diet Order           Diet Heart Room service appropriate? Yes; Fluid consistency: Thin  Diet effective now           Family Communication  :  None present  Code Status :  Full  Disposition Plan  :  HHPT in am  Consults  :    Procedures  :   CTA -  IMPRESSION: 1. Bilateral lower lobe pulmonary emboli with mild right heart strain, RV/LV ratio 1. Positive for acute PE with CT evidence of right heart strain (RV/LV Ratio = 1) consistent with at least submassive (intermediate risk) PE. The presence of right heart strain has been associated with an increased risk of morbidity and mortality. Please activate Code PE by paging [phone]. These results were called by telephone at the time of interpretation on 07/26/2017 at 6:21 pm to Dr. Loren Racer , who verbally acknowledged these results. 2. Nonaneurysmal atherosclerotic aorta without dissection. 3. Left main and three-vessel coronary arteriosclerosis. 4. Pulmonary consolidation in the lingula suspicious for pneumonia with reactive mediastinal adenopathy. 5. Tree-in-bud opacities in the lower lobes be keeping with bronchiolitis  Lower extremity venous duplex.    Right: There is no evidence of deep vein thrombosis in the lower extremity. No cystic structure found in the popliteal fossa. Left: There is no evidence of deep vein thrombosis in the lower extremity. No cystic structure found in the popliteal fossa.  TTE   - Left ventricle: The cavity size was normal. Systolic function was normal. The estimated ejection fraction  was in the range of 60% to 65%. Wall motion was normal; there were no regional wall motion abnormalities. Doppler parameters are consistent with abnormal left ventricular relaxation (grade 1 diastolicdysfunction). Doppler parameters are consistent with elevated ventricular end-diastolic filling pressure. - Aortic valve: Trileaflet; normal thickness leaflets. - Aortic root: The aortic root was normal in size. - Mitral valve: There was trivial regurgitation. - Right ventricle: The cavity size was normal. Wall thickness was normal. Systolic function was normal. - Right atrium: The atrium was normal in size. - Tricuspid valve: There was mild regurgitation. - Pulmonic valve: There was no regurgitation. - Pulmonary arteries: Systolic pressure was within the normal  range. - Inferior vena cava: The vessel was normal in size. - Pericardium, extracardiac: There was no pericardial effusion.     DVT Prophylaxis  :   Heparin gtt  Lab Results  Component Value Date   PLT 245 07/28/2017    Inpatient Medications  Scheduled Meds: . atorvastatin  20 mg Oral QHS  . feeding supplement (ENSURE ENLIVE)  237 mL Oral BID BM  . insulin aspart  0-9 Units Subcutaneous TID WC  . ipratropium-albuterol  3 mL Nebulization QID  . levothyroxine  100 mcg Oral QAC breakfast  . pantoprazole  40 mg Oral Daily  . traZODone  50 mg Oral QHS  . venlafaxine XR  150 mg Oral Q breakfast   Continuous Infusions: . azithromycin Stopped (07/27/17 2330)  . cefTRIAXone (ROCEPHIN)  IV Stopped (07/27/17 1508)  . heparin 1,300 Units/hr (07/28/17 0600)   PRN Meds:.acetaminophen **OR** acetaminophen, albuterol, fluticasone, guaiFENesin-dextromethorphan, HYDROcodone-acetaminophen, ondansetron **OR** ondansetron (ZOFRAN) IV, polyethylene glycol  Antibiotics  :    Anti-infectives (From admission, onward)   Start     Dose/Rate Route Frequency Ordered Stop   07/27/17 2200  azithromycin (ZITHROMAX) 500 mg in sodium chloride 0.9 %  250 mL IVPB  500 mg 250 mL/hr over 60 Minutes Intravenous Every 24 hours 07/26/17 2248     07/27/17 1400  cefTRIAXone (ROCEPHIN) 1 g in sodium chloride 0.9 % 100 mL IVPB     1 g 200 mL/hr over 30 Minutes Intravenous Every 24 hours 07/26/17 2248     07/26/17 1900  levofloxacin (LEVAQUIN) IVPB 500 mg     500 mg 100 mL/hr over 60 Minutes Intravenous  Once 07/26/17 1825 07/26/17 2033         Objective:   Vitals:   07/28/17 0002 07/28/17 0333 07/28/17 0723 07/28/17 0800  BP: 120/60 112/65 125/66   Pulse: (!) 115 (!) 112    Resp: (!) 23 (!) 23    Temp: 98.4 F (36.9 C) 98 F (36.7 C) 98.5 F (36.9 C)   TempSrc: Oral Oral Oral   SpO2: 97% 97%  94%  Weight:      Height:        Wt Readings from Last 3 Encounters:  07/26/17 74.2 kg (163 lb 9.3 oz)  07/25/17 72.6 kg (160 lb)  07/02/17 73.9 kg (163 lb)     Intake/Output Summary (Last 24 hours) at 07/28/2017 1038 Last data filed at 07/28/2017 0600 Gross per 24 hour  Intake 780.04 ml  Output -  Net 780.04 ml     Physical Exam  Awake Alert, Oriented X 3, No new F.N deficits, Normal affect [place], small right forehead hematoma stable,PERRAL Supple Neck,No JVD, No cervical lymphadenopathy appriciated.  Symmetrical Chest wall movement, Good air movement bilaterally, few rales RRR,No Gallops,Rubs or new Murmurs, No Parasternal Heave +ve B.Sounds, Abd Soft, No tenderness, No organomegaly appriciated, No rebound - guarding or rigidity. No Cyanosis, Clubbing or edema, No new Rash or bruise       Data Review:    CBC Recent Labs  Lab 07/26/17 1529 07/27/17 0302 07/28/17 0255  WBC 11.9* 10.7* 9.1  HGB 12.1 12.0 11.0*  HCT 38.0 39.0 36.2  PLT 251 230 245  MCV 92.5 94.0 93.8  MCH 29.4 28.9 28.5  MCHC 31.8 30.8 30.4  RDW 13.2 13.2 13.4    Chemistries  Recent Labs  Lab 07/26/17 1529  NA 140  K 4.5  CL 102  CO2 28  GLUCOSE 116*  BUN 11  CREATININE 1.02*  CALCIUM 8.9    ------------------------------------------------------------------------------------------------------------------ No results for input(s): CHOL, HDL, LDLCALC, TRIG, CHOLHDL, LDLDIRECT in the last 72 hours.  No results found for: HGBA1C ------------------------------------------------------------------------------------------------------------------ Recent Labs    07/27/17 1954  TSH 2.150   ------------------------------------------------------------------------------------------------------------------ No results for input(s): VITAMINB12, FOLATE, FERRITIN, TIBC, IRON, RETICCTPCT in the last 72 hours.  Coagulation profile No results for input(s): INR, PROTIME in the last 168 hours.  Recent Labs    07/26/17 1529  DDIMER 1.89*    Cardiac Enzymes Recent Labs  Lab 07/26/17 1529  TROPONINI <0.03   ------------------------------------------------------------------------------------------------------------------ No results found for: BNP  Micro Results Recent Results (from the past 240 hour(s))  MRSA PCR Screening     Status: None   Collection Time: 07/26/17 10:39 PM  Result Value Ref Range Status   MRSA by PCR NEGATIVE NEGATIVE Final    Comment:        The GeneXpert MRSA Assay (FDA approved for NASAL specimens only), is one component of a comprehensive MRSA colonization surveillance program. It is not intended to diagnose MRSA infection nor to guide or monitor treatment for MRSA infections. Performed at [facility], [address], [place], [place] [address]  Radiology Reports Ct Head Wo Contrast  Result Date: 07/26/2017 CLINICAL DATA:  Altered level of consciousness EXAM: CT HEAD WITHOUT CONTRAST TECHNIQUE: Contiguous axial images were obtained from the base of the skull through the vertex without intravenous contrast. COMPARISON:  None. FINDINGS: Brain: No acute intracranial abnormality. Specifically, no hemorrhage, hydrocephalus, mass lesion, acute  infarction, or significant intracranial injury. Mild age related volume loss. Vascular: No hyperdense vessel or unexpected calcification. Skull: No acute calvarial abnormality. Sinuses/Orbits: Visualized paranasal sinuses and mastoids clear. Orbital soft tissues unremarkable. Other: None IMPRESSION: No acute intracranial abnormality. Electronically Signed   By: Charlett Nose M.D.   On: 07/26/2017 18:09   Ct Angio Chest Pe W And/or Wo Contrast  Result Date: 07/26/2017 CLINICAL DATA:  Shella Spearing the right forehead and possible pneumonia. Known witnessed syncopal episode last evening. EXAM: CT ANGIOGRAPHY CHEST WITH CONTRAST TECHNIQUE: Multidetector CT imaging of the chest was performed using the standard protocol during bolus administration of intravenous contrast. Multiplanar CT image reconstructions and MIPs were obtained to evaluate the vascular anatomy. CONTRAST:  60mL ISOVUE-370 IOPAMIDOL (ISOVUE-370) INJECTION 76% COMPARISON:  04/08/2016 CXR, cardiac CT 02/06/2017 FINDINGS: Cardiovascular: Conventional branch pattern of the great vessels with atherosclerosis. Preferential opacification of the pulmonary arterial system, adequate for assessment to the level of the distal lobar and segmental arteries. Small pulmonary emboli to the lower lobes are noted within the distal lobar and proximal segmental pulmonary arteries. RV/LV ratio 1. No pericardial effusion. Atherosclerotic nonaneurysmal thoracic aorta. No dissection. Left main and three-vessel coronary arteriosclerosis. Mediastinum/Nodes: Small cluster of mildly enlarged left lower paratracheal lymph nodes measuring up to 13 mm short axis likely reactive. Small bilateral hilar lymph nodes without pathologic enlargement. Esophagus, trachea and mainstem bronchi are unremarkable. There is mild lower lobe peribronchial thickening. Lungs/Pleura: Pulmonary consolidation noted in the lingula suspicious for pneumonia and/or atelectasis. No effusion or pneumothorax. No  dominant mass. Bilateral tree-in-bud like opacities in the lower lobes consistent with bronchiolitis. Subsegmental atelectasis in the periphery of the left lower lobe. Upper Abdomen: No acute abnormality. Musculoskeletal: No chest wall abnormality. No acute or significant osseous findings. Review of the MIP images confirms the above findings. IMPRESSION: 1. Bilateral lower lobe pulmonary emboli with mild right heart strain, RV/LV ratio 1. Positive for acute PE with CT evidence of right heart strain (RV/LV Ratio = 1) consistent with at least submassive (intermediate risk) PE. The presence of right heart strain has been associated with an increased risk of morbidity and mortality. Please activate Code PE by paging [phone]. These results were called by telephone at the time of interpretation on 07/26/2017 at 6:21 pm to Dr. Loren Racer , who verbally acknowledged these results. 2. Nonaneurysmal atherosclerotic aorta without dissection. 3. Left main and three-vessel coronary arteriosclerosis. 4. Pulmonary consolidation in the lingula suspicious for pneumonia with reactive mediastinal adenopathy. 5. Tree-in-bud opacities in the lower lobes be keeping with bronchiolitis. Aortic Atherosclerosis (ICD10-I70.0). Electronically Signed   By: Tollie Eth M.D.   On: 07/26/2017 18:23    Time Spent in minutes  30   Susa Raring M.D on 07/28/2017 at 10:38 AM  To page go to [email or web address] - password [facility]

## 2017-07-28 NOTE — Progress Notes (Signed)
Patient had ambulated earlier with PT and had been up in the chair for the afternoon visiting.  She wanted to get into bed and heart rate was up to 130.

## 2017-07-29 ENCOUNTER — Inpatient Hospital Stay (HOSPITAL_COMMUNITY): Payer: Medicare HMO

## 2017-07-29 LAB — CBC
HCT: 36.1 % (ref 36.0–46.0)
Hemoglobin: 11.1 g/dL — ABNORMAL LOW (ref 12.0–15.0)
MCH: 28.7 pg (ref 26.0–34.0)
MCHC: 30.7 g/dL (ref 30.0–36.0)
MCV: 93.3 fL (ref 78.0–100.0)
Platelets: 267 10*3/uL (ref 150–400)
RBC: 3.87 MIL/uL (ref 3.87–5.11)
RDW: 13.3 % (ref 11.5–15.5)
WBC: 7.8 10*3/uL (ref 4.0–10.5)

## 2017-07-29 LAB — GLUCOSE, CAPILLARY
Glucose-Capillary: 152 mg/dL — ABNORMAL HIGH (ref 70–99)
Glucose-Capillary: 158 mg/dL — ABNORMAL HIGH (ref 70–99)
Glucose-Capillary: 173 mg/dL — ABNORMAL HIGH (ref 70–99)
Glucose-Capillary: 150 mg/dL — ABNORMAL HIGH (ref 70–99)

## 2017-07-29 MED ORDER — LEVALBUTEROL HCL 0.63 MG/3ML IN NEBU: 0.6300 mg | INHALATION_SOLUTION | Freq: Four times a day (QID) | RESPIRATORY_TRACT | Status: DC | PRN

## 2017-07-29 MED ORDER — METOPROLOL TARTRATE 5 MG/5ML IV SOLN
5.0000 mg | INTRAVENOUS | Status: DC | PRN
  Administered 2017-07-29: 5 mg via INTRAVENOUS
  Filled 2017-07-29: qty 5

## 2017-07-29 MED ORDER — METOPROLOL TARTRATE 25 MG PO TABS
25.0000 mg | ORAL_TABLET | Freq: Two times a day (BID) | ORAL | Status: DC
  Administered 2017-07-29 – 2017-07-31 (×4): 25 mg via ORAL
  Filled 2017-07-29 (×5): qty 1

## 2017-07-29 MED ORDER — IPRATROPIUM-ALBUTEROL 0.5-2.5 (3) MG/3ML IN SOLN
3.0000 mL | Freq: Three times a day (TID) | RESPIRATORY_TRACT | Status: DC
  Administered 2017-07-29 – 2017-07-30 (×4): 3 mL via RESPIRATORY_TRACT
  Filled 2017-07-29 (×4): qty 3

## 2017-07-29 NOTE — Progress Notes (Signed)
Benefit [facility] submitted. CC'd to The PepsiDeborah Taylor.

## 2017-07-29 NOTE — Plan of Care (Signed)
Pt heart rate increasing with mobilization. Pt stable otherwise.

## 2017-07-29 NOTE — Progress Notes (Signed)
Patient's heart rate started increasing to the 150s. Pt was sitting on edge of bed C/O shortness of breath, dyspnea, and anxiety. PRN Albuterol given. Bilateral expiratory wheezes auscultated. Pt instructed to do pursed lip breathing, cool wash cloth provided for anxiety, and assisted back to bed with HOB at 45 degrees. Patient's HR decreased to 130. Pt experienced relief and decreased work of breathing.

## 2017-07-29 NOTE — Progress Notes (Signed)
 @IPLOG @        PROGRESS NOTE                                                                                                                                                                                                             Patient Demographics:    [name] [name], is a 72 y.o. female, DOB - 01/04/1946, ZOX:[id number]  Admit date - 07/26/2017   Admitting Physician Onnie BoerEjiroghene E Emokpae, MD  Outpatient Primary MD for the patient is Shirlean MylarWebb, Carol, MD  LOS - 3  Chief Complaint  Patient presents with  . Loss of Consciousness  . Cough       Brief Narrative   [name] [name] is a 72 y.o. female with medical history significant for thyrotoxicosis, HTN, COPD on chronic 2-3 L O2, DM, who presented to the ED with complaints of cough that started 3 days ago-nonproductive.  Yesterday she started having difficulty breathing, and pain on the left side of her back/side of her chest.  This morning patient was sitting down to about 6:30 AM attempted to get up, and suddenly passed out, for an unknown period of time.  Further work-up in the ER showed a large PE.   Subjective:   Patient in bed, appears comfortable, denies any headache, no fever, no chest pain or pressure, still has a dry cough but improved shortness of breath , no abdominal pain. No focal weakness.   Assessment  & Plan :     1.  Acute on chronic hypoxic respiratory failure due to hemodynamically significant PE causing syncope.  Treated with heparin drip for 48 hours now transition to Xarelto this morning, echocardiogram stable without any evidence of right heart strain, lower extremity venous duplex does not show any additional clot burden.  She is still somewhat tachycardic and heart rate frequently goes into 130s.  We will continue to monitor on telemetry.  Of note her baseline heart rate is around 100, TSH is stable.  Once tachycardia is slightly more improved can be discharged home with Xarelto.    2.  Possible pneumonia.   Mild leukocytosis, dry cough, CT evidence of infiltrate.  Continue combination of azithromycin and Omnicef orally, repeat two-view chest x-ray on 07/29/2017.  3.  COPD with chronic hypoxic respiratory failure on home oxygen.  Supportive care.  4.  DM type II.  Metformin on hold.  On sliding scale.  CBG (last 3)  Recent Labs    07/28/17 1708 07/28/17 2135 07/29/17 0728  GLUCAP 148* 175* 152*  5.  Mild tachycardia.  Likely due to PE, TSH stable, baseline heart rate is slightly on the tachycardic side going back and notes it seems that her heart rate runs between 100 - 110.  However she is currently running around 130s.  Monitor on telemetry once improved to discharge home.  6. Hypothyroidism.  On Synthroid stable TSH.  7.  Fall related right forehead hematoma.  Monitor.   Diet :  Diet Order           Diet Heart Room service appropriate? Yes; Fluid consistency: Thin  Diet effective now           Family Communication  :  None present  Code Status :  Full  Disposition Plan  : Telemetry, likely home in 1 to 2 days once tachycardia is better  Consults  : None  Procedures  :   CTA -  IMPRESSION: 1. Bilateral lower lobe pulmonary emboli with mild right heart strain, RV/LV ratio 1. Positive for acute PE with CT evidence of right heart strain (RV/LV Ratio = 1) consistent with at least submassive (intermediate risk) PE. The presence of right heart strain has been associated with an increased risk of morbidity and mortality. Please activate Code PE by paging [phone]. These results were called by telephone at the time of interpretation on 07/26/2017 at 6:21 pm to Dr. Loren Racer , who verbally acknowledged these results. 2. Nonaneurysmal atherosclerotic aorta without dissection. 3. Left main and three-vessel coronary arteriosclerosis. 4. Pulmonary consolidation in the lingula suspicious for pneumonia with reactive mediastinal adenopathy. 5. Tree-in-bud opacities in the lower lobes be  keeping with bronchiolitis  Lower extremity venous duplex.    Right: There is no evidence of deep vein thrombosis in the lower extremity. No cystic structure found in the popliteal fossa. Left: There is no evidence of deep vein thrombosis in the lower extremity. No cystic structure found in the popliteal fossa.  TTE   - Left ventricle: The cavity size was normal. Systolic function was normal. The estimated ejection fraction was in the range of 60% to 65%. Wall motion was normal; there were no regional wall motion abnormalities. Doppler parameters are consistent with abnormal left ventricular relaxation (grade 1 diastolicdysfunction). Doppler parameters are consistent with elevated ventricular end-diastolic filling pressure. - Aortic valve: Trileaflet; normal thickness leaflets. - Aortic root: The aortic root was normal in size. - Mitral valve: There was trivial regurgitation. - Right ventricle: The cavity size was normal. Wall thickness was normal. Systolic function was normal. - Right atrium: The atrium was normal in size. - Tricuspid valve: There was mild regurgitation. - Pulmonic valve: There was no regurgitation. - Pulmonary arteries: Systolic pressure was within the normal  range. - Inferior vena cava: The vessel was normal in size. - Pericardium, extracardiac: There was no pericardial effusion.     DVT Prophylaxis  :   Heparin gtt  Lab Results  Component Value Date   PLT 267 07/29/2017    Inpatient Medications  Scheduled Meds: . atorvastatin  20 mg Oral QHS  . azithromycin  500 mg Oral Daily  . cefdinir  300 mg Oral Q12H  . feeding supplement (ENSURE ENLIVE)  237 mL Oral BID BM  . insulin aspart  0-9 Units Subcutaneous TID WC  . ipratropium-albuterol  3 mL Nebulization TID  . levothyroxine  100 mcg Oral QAC breakfast  . pantoprazole  40 mg Oral Daily  . Rivaroxaban  15 mg Oral BID WC  . traZODone  50 mg  Oral QHS  . venlafaxine XR  150 mg Oral Q breakfast    Continuous Infusions:  PRN Meds:.acetaminophen **OR** acetaminophen, fluticasone, guaiFENesin-dextromethorphan, HYDROcodone-acetaminophen, levalbuterol, metoprolol tartrate, ondansetron **OR** ondansetron (ZOFRAN) IV, polyethylene glycol  Antibiotics  :    Anti-infectives (From admission, onward)   Start     Dose/Rate Route Frequency Ordered Stop   07/28/17 1200  cefdinir (OMNICEF) capsule 300 mg     300 mg Oral Every 12 hours 07/28/17 1154 07/31/17 2159   07/28/17 1045  cefdinir (OMNICEF) capsule 300 mg  Status:  Discontinued     300 mg Oral Every 12 hours 07/28/17 1042 07/28/17 1154   07/28/17 1045  azithromycin (ZITHROMAX) tablet 500 mg     500 mg Oral Daily 07/28/17 1042 08/01/17 0959   07/27/17 2200  azithromycin (ZITHROMAX) 500 mg in sodium chloride 0.9 % 250 mL IVPB  Status:  Discontinued     500 mg 250 mL/hr over 60 Minutes Intravenous Every 24 hours 07/26/17 2248 07/28/17 1042   07/27/17 1400  cefTRIAXone (ROCEPHIN) 1 g in sodium chloride 0.9 % 100 mL IVPB  Status:  Discontinued     1 g 200 mL/hr over 30 Minutes Intravenous Every 24 hours 07/26/17 2248 07/28/17 1042   07/26/17 1900  levofloxacin (LEVAQUIN) IVPB 500 mg     500 mg 100 mL/hr over 60 Minutes Intravenous  Once 07/26/17 1825 07/26/17 2033         Objective:   Vitals:   07/29/17 0328 07/29/17 0358 07/29/17 0726 07/29/17 0755  BP: 138/74 (!) 153/73 121/69   Pulse: (!) 128  (!) 119   Resp: (!) 26  (!) 26   Temp: 99.5 F (37.5 C)  99.2 F (37.3 C)   TempSrc: Oral  Oral   SpO2: 95%  94% 99%  Weight:      Height:        Wt Readings from Last 3 Encounters:  07/26/17 74.2 kg (163 lb 9.3 oz)  07/25/17 72.6 kg (160 lb)  07/02/17 73.9 kg (163 lb)     Intake/Output Summary (Last 24 hours) at 07/29/2017 1024 Last data filed at 07/29/2017 0600 Gross per 24 hour  Intake 545.85 ml  Output 1050 ml  Net -504.15 ml     Physical Exam  Awake Alert, Oriented X 3, No new F.N deficits, Normal  affect [place].AT,PERRAL Supple Neck,No JVD, No cervical lymphadenopathy appriciated.  Symmetrical Chest wall movement, Good air movement bilaterally, CTAB RRR,No Gallops, Rubs or new Murmurs, No Parasternal Heave +ve B.Sounds, Abd Soft, No tenderness, No organomegaly appriciated, No rebound - guarding or rigidity. No Cyanosis, Clubbing or edema, No new Rash or bruise   Data Review:    CBC Recent Labs  Lab 07/26/17 1529 07/27/17 0302 07/28/17 0255 07/29/17 0230  WBC 11.9* 10.7* 9.1 7.8  HGB 12.1 12.0 11.0* 11.1*  HCT 38.0 39.0 36.2 36.1  PLT 251 230 245 267  MCV 92.5 94.0 93.8 93.3  MCH 29.4 28.9 28.5 28.7  MCHC 31.8 30.8 30.4 30.7  RDW 13.2 13.2 13.4 13.3    Chemistries  Recent Labs  Lab 07/26/17 1529  NA 140  K 4.5  CL 102  CO2 28  GLUCOSE 116*  BUN 11  CREATININE 1.02*  CALCIUM 8.9   ------------------------------------------------------------------------------------------------------------------ No results for input(s): CHOL, HDL, LDLCALC, TRIG, CHOLHDL, LDLDIRECT in the last 72 hours.  No results found for: HGBA1C ------------------------------------------------------------------------------------------------------------------ Recent Labs    07/27/17 1954  TSH 2.150   ------------------------------------------------------------------------------------------------------------------ No results for input(s):  VITAMINB12, FOLATE, FERRITIN, TIBC, IRON, RETICCTPCT in the last 72 hours.  Coagulation profile No results for input(s): INR, PROTIME in the last 168 hours.  Recent Labs    07/26/17 1529  DDIMER 1.89*    Cardiac Enzymes Recent Labs  Lab 07/26/17 1529  TROPONINI <0.03   ------------------------------------------------------------------------------------------------------------------ No results found for: BNP  Micro Results Recent Results (from the past 240 hour(s))  MRSA PCR Screening     Status: None   Collection Time: 07/26/17 10:39 PM   Result Value Ref Range Status   MRSA by PCR NEGATIVE NEGATIVE Final    Comment:        The GeneXpert MRSA Assay (FDA approved for NASAL specimens only), is one component of a comprehensive MRSA colonization surveillance program. It is not intended to diagnose MRSA infection nor to guide or monitor treatment for MRSA infections. Performed at [facility], [address] [place], [place] [address]     Radiology Reports Ct Head Wo Contrast  Result Date: 07/26/2017 CLINICAL DATA:  Altered level of consciousness EXAM: CT HEAD WITHOUT CONTRAST TECHNIQUE: Contiguous axial images were obtained from the base of the skull through the vertex without intravenous contrast. COMPARISON:  None. FINDINGS: Brain: No acute intracranial abnormality. Specifically, no hemorrhage, hydrocephalus, mass lesion, acute infarction, or significant intracranial injury. Mild age related volume loss. Vascular: No hyperdense vessel or unexpected calcification. Skull: No acute calvarial abnormality. Sinuses/Orbits: Visualized paranasal sinuses and mastoids clear. Orbital soft tissues unremarkable. Other: None IMPRESSION: No acute intracranial abnormality. Electronically Signed   By: Charlett Nose M.D.   On: 07/26/2017 18:09   Ct Angio Chest Pe W And/or Wo Contrast  Result Date: 07/26/2017 CLINICAL DATA:  Shella Spearing the right forehead and possible pneumonia. Known witnessed syncopal episode last evening. EXAM: CT ANGIOGRAPHY CHEST WITH CONTRAST TECHNIQUE: Multidetector CT imaging of the chest was performed using the standard protocol during bolus administration of intravenous contrast. Multiplanar CT image reconstructions and MIPs were obtained to evaluate the vascular anatomy. CONTRAST:  60mL ISOVUE-370 IOPAMIDOL (ISOVUE-370) INJECTION 76% COMPARISON:  04/08/2016 CXR, cardiac CT 02/06/2017 FINDINGS: Cardiovascular: Conventional branch pattern of the great vessels with atherosclerosis. Preferential opacification of the  pulmonary arterial system, adequate for assessment to the level of the distal lobar and segmental arteries. Small pulmonary emboli to the lower lobes are noted within the distal lobar and proximal segmental pulmonary arteries. RV/LV ratio 1. No pericardial effusion. Atherosclerotic nonaneurysmal thoracic aorta. No dissection. Left main and three-vessel coronary arteriosclerosis. Mediastinum/Nodes: Small cluster of mildly enlarged left lower paratracheal lymph nodes measuring up to 13 mm short axis likely reactive. Small bilateral hilar lymph nodes without pathologic enlargement. Esophagus, trachea and mainstem bronchi are unremarkable. There is mild lower lobe peribronchial thickening. Lungs/Pleura: Pulmonary consolidation noted in the lingula suspicious for pneumonia and/or atelectasis. No effusion or pneumothorax. No dominant mass. Bilateral tree-in-bud like opacities in the lower lobes consistent with bronchiolitis. Subsegmental atelectasis in the periphery of the left lower lobe. Upper Abdomen: No acute abnormality. Musculoskeletal: No chest wall abnormality. No acute or significant osseous findings. Review of the MIP images confirms the above findings. IMPRESSION: 1. Bilateral lower lobe pulmonary emboli with mild right heart strain, RV/LV ratio 1. Positive for acute PE with CT evidence of right heart strain (RV/LV Ratio = 1) consistent with at least submassive (intermediate risk) PE. The presence of right heart strain has been associated with an increased risk of morbidity and mortality. Please activate Code PE by paging [phone]. These results were called by  telephone at the time of interpretation on 07/26/2017 at 6:21 pm to Dr. Loren Racer , who verbally acknowledged these results. 2. Nonaneurysmal atherosclerotic aorta without dissection. 3. Left main and three-vessel coronary arteriosclerosis. 4. Pulmonary consolidation in the lingula suspicious for pneumonia with reactive mediastinal adenopathy. 5.  Tree-in-bud opacities in the lower lobes be keeping with bronchiolitis. Aortic Atherosclerosis (ICD10-I70.0). Electronically Signed   By: Tollie Eth M.D.   On: 07/26/2017 18:23    Time Spent in minutes  30   Susa Raring M.D on 07/29/2017 at 10:24 AM  To page go to [email or web address] - password [facility]

## 2017-07-30 DIAGNOSIS — E039 Hypothyroidism, unspecified: Secondary | ICD-10-CM

## 2017-07-30 DIAGNOSIS — J189 Pneumonia, unspecified organism: Secondary | ICD-10-CM

## 2017-07-30 DIAGNOSIS — J449 Chronic obstructive pulmonary disease, unspecified: Secondary | ICD-10-CM

## 2017-07-30 LAB — BASIC METABOLIC PANEL
Anion gap: 9 (ref 5–15)
BUN: 8 mg/dL (ref 8–23)
CO2: 30 mmol/L (ref 22–32)
Calcium: 9.2 mg/dL (ref 8.9–10.3)
Chloride: 101 mmol/L (ref 98–111)
Creatinine, Ser: 0.94 mg/dL (ref 0.44–1.00)
GFR calc Af Amer: >60 mL/min (ref >60)
GFR calc non Af Amer: 59 mL/min — ABNORMAL LOW (ref >60)
Glucose, Bld: 146 mg/dL — ABNORMAL HIGH (ref 70–99)
Potassium: 4 mmol/L (ref 3.5–5.1)
Sodium: 140 mmol/L (ref 135–145)

## 2017-07-30 LAB — CBC
HCT: 36.7 % (ref 36.0–46.0)
Hemoglobin: 11.3 g/dL — ABNORMAL LOW (ref 12.0–15.0)
MCH: 28.9 pg (ref 26.0–34.0)
MCHC: 30.8 g/dL (ref 30.0–36.0)
MCV: 93.9 fL (ref 78.0–100.0)
Platelets: 303 10*3/uL (ref 150–400)
RBC: 3.91 MIL/uL (ref 3.87–5.11)
RDW: 13.3 % (ref 11.5–15.5)
WBC: 6.2 10*3/uL (ref 4.0–10.5)

## 2017-07-30 LAB — MAGNESIUM: Magnesium: 1.9 mg/dL (ref 1.7–2.4)

## 2017-07-30 LAB — GLUCOSE, CAPILLARY
Glucose-Capillary: 150 mg/dL — ABNORMAL HIGH (ref 70–99)
Glucose-Capillary: 174 mg/dL — ABNORMAL HIGH (ref 70–99)
Glucose-Capillary: 167 mg/dL — ABNORMAL HIGH (ref 70–99)
Glucose-Capillary: 162 mg/dL — ABNORMAL HIGH (ref 70–99)

## 2017-07-30 MED ORDER — METHYLPREDNISOLONE SODIUM SUCC 40 MG IJ SOLR
40.0000 mg | Freq: Two times a day (BID) | INTRAMUSCULAR | Status: DC
  Administered 2017-07-30 – 2017-07-31 (×3): 40 mg via INTRAVENOUS
  Filled 2017-07-30 (×3): qty 1

## 2017-07-30 MED ORDER — BENZONATATE 100 MG PO CAPS
100.0000 mg | ORAL_CAPSULE | Freq: Three times a day (TID) | ORAL | Status: DC
  Administered 2017-07-30 – 2017-07-31 (×4): 100 mg via ORAL
  Filled 2017-07-30 (×4): qty 1

## 2017-07-30 MED ORDER — LEVALBUTEROL HCL 0.63 MG/3ML IN NEBU
0.6300 mg | INHALATION_SOLUTION | Freq: Four times a day (QID) | RESPIRATORY_TRACT | Status: DC
  Administered 2017-07-30 – 2017-07-31 (×5): 0.63 mg via RESPIRATORY_TRACT
  Filled 2017-07-30 (×5): qty 3

## 2017-07-30 MED ORDER — GUAIFENESIN-CODEINE 100-10 MG/5ML PO SOLN
5.0000 mL | Freq: Four times a day (QID) | ORAL | Status: DC
  Administered 2017-07-30 (×2): 5 mL via ORAL
  Filled 2017-07-30 (×4): qty 5

## 2017-07-30 NOTE — Progress Notes (Signed)
PROGRESS NOTE    [name] [name]  ZOX:[id number] DOB: 08/26/1945 DOA: 07/26/2017 PCP: Shirlean MylarWebb, Carol, MD   Brief Narrative: Patient is a 72 year old female with past medical history of thyrotoxicosis, hypertension, COPD on home oxygen at 3 L/min, diabetes who presented to the emergency department with complaints of cough which was nonproductive.  Patient reported of having shortness of breath.  She also reported of passing out at home.  Work-up at the emergency department showed  large pulmonary embolism.  Currently she is being managed for ongoing dyspnea, cough and COPD exacerbation.  Assessment & Plan:   Principal Problem:   Pulmonary embolism (HCC) Active Problems:   Thyrotoxicosis   OBESITY   Essential hypertension   COPD (chronic obstructive pulmonary disease) (HCC)   Non-insulin treated type 2 diabetes mellitus (HCC)  Acute on chronic hypoxic respiratory failure: Contributed by pulmonary embolism on the top of COPD.  She was treated with heparin drip for 48 hours.  Currently she has been started on Xarelto.  Echocardiogram did not show any evidence of right heart strain.  Lower extremity venous duplex did not show any DVT.  She is still tachycardic.  We will continue to monitor on telemetry.  TSH is stable.  Plan for discharging to home with Xarelto after her heart rate stabilizes.  Pneumonia: Presented with mild leukocytosis, dry cough.  CT and chest x-ray showed pneumonia.  Started on azithromycin and Omnicef.  COPD: History of chronic respiratory hypoxic respiratory failure on home oxygen.  Noted to be wheezing this morning.  Started on Solu-Medrol.  Continue cough suppressants.  Continue supplemental oxygen.  Diabetes type 2: On sliding scale insulin.  Tachycardia: Currently in sinus tachycardia.  Most likely precipitated by ongoing hypoxia and cough.  Hypothyroidism: Continue thyroxine  Fall: Patient worked with physical therapy.  No specific recommendation.  She has a  large right periorbital ecchymosis.   DVT prophylaxis: Xarelto Code Status: Full Family Communication: None present at the bedside Disposition Plan: Likely home tomorrow  Consultants: None   Procedures: None  Antimicrobials: Omnicef and azithromycin  Subjective: Patient seen and examined the bedside this morning.  Was continuously having dry cough.  Noted to be wheezing on auscultation.  Still significantly tachycardic.  Objective: Vitals:   07/29/17 1448 07/29/17 2326 07/30/17 0756 07/30/17 0757  BP:  (!) 143/77 139/79 139/79  Pulse:  (!) 123 (!) 124 (!) 122  Resp:  (!) 23 19 19   Temp:  98.7 F (37.1 C) 98.6 F (37 C)   TempSrc:  Oral Oral   SpO2: 95% 97% 93% 92%  Weight:      Height:        Intake/Output Summary (Last 24 hours) at 07/30/2017 1015 Last data filed at 07/30/2017 0815 Gross per 24 hour  Intake 320 ml  Output 1000 ml  Net -680 ml   Filed Weights   07/26/17 2102  Weight: 74.2 kg (163 lb 9.3 oz)    Examination:  General exam: In mild to moderate distress due to persistent cough,average built HEENT:PERRL,Oral mucosa moist, Ear/Nose normal on gross exam, large right periorbital ecchymosis Respiratory system: Bilateral decreased air entry, bilateral expiratory wheezes cardiovascular system: Sinus tachycardia, no JVD, murmurs, rubs, gallops or clicks. No pedal edema. Gastrointestinal system: Abdomen is nondistended, soft and nontender. No organomegaly or masses felt. Normal bowel sounds heard. Central nervous system: Alert and oriented. No focal neurological deficits. Extremities: No edema, no clubbing ,no cyanosis, distal peripheral pulses palpable. Skin: No lesions or ulcers,no icterus ,no pallor  MSK: Normal muscle bulk,tone ,power Psychiatry: Judgement and insight appear normal. Mood & affect appropriate.     Data Reviewed: I have personally reviewed following labs and imaging studies  CBC: Recent Labs  Lab 07/26/17 1529 07/27/17 0302  07/28/17 0255 07/29/17 0230 07/30/17 0227  WBC 11.9* 10.7* 9.1 7.8 6.2  HGB 12.1 12.0 11.0* 11.1* 11.3*  HCT 38.0 39.0 36.2 36.1 36.7  MCV 92.5 94.0 93.8 93.3 93.9  PLT 251 230 245 267 303   Basic Metabolic Panel: Recent Labs  Lab 07/26/17 1529 07/30/17 0227  NA 140 140  K 4.5 4.0  CL 102 101  CO2 28 30  GLUCOSE 116* 146*  BUN 11 8  CREATININE 1.02* 0.94  CALCIUM 8.9 9.2  MG  --  1.9   GFR: Estimated Creatinine Clearance: 49.9 mL/min (by C-G formula based on SCr of 0.94 mg/dL). Liver Function Tests: No results for input(s): AST, ALT, ALKPHOS, BILITOT, PROT, ALBUMIN in the last 168 hours. No results for input(s): LIPASE, AMYLASE in the last 168 hours. No results for input(s): AMMONIA in the last 168 hours. Coagulation Profile: No results for input(s): INR, PROTIME in the last 168 hours. Cardiac Enzymes: Recent Labs  Lab 07/26/17 1529  TROPONINI <0.03   BNP (last 3 results) No results for input(s): PROBNP in the last 8760 hours. HbA1C: No results for input(s): HGBA1C in the last 72 hours. CBG: Recent Labs  Lab 07/29/17 0728 07/29/17 1133 07/29/17 1640 07/29/17 2126 07/30/17 0758  GLUCAP 152* 158* 173* 150* 150*   Lipid Profile: No results for input(s): CHOL, HDL, LDLCALC, TRIG, CHOLHDL, LDLDIRECT in the last 72 hours. Thyroid Function Tests: Recent Labs    07/27/17 1954  TSH 2.150   Anemia Panel: No results for input(s): VITAMINB12, FOLATE, FERRITIN, TIBC, IRON, RETICCTPCT in the last 72 hours. Sepsis Labs: No results for input(s): PROCALCITON, LATICACIDVEN in the last 168 hours.  Recent Results (from the past 240 hour(s))  MRSA PCR Screening     Status: None   Collection Time: 07/26/17 10:39 PM  Result Value Ref Range Status   MRSA by PCR NEGATIVE NEGATIVE Final    Comment:        The GeneXpert MRSA Assay (FDA approved for NASAL specimens only), is one component of a comprehensive MRSA colonization surveillance program. It is not intended to  diagnose MRSA infection nor to guide or monitor treatment for MRSA infections. Performed at [facility], [address] [place], [place] [address]          Radiology Studies: Dg Chest 2 View  Result Date: 07/29/2017 CLINICAL DATA:  Pt states ongoing SOB due to COPD. Currently here for syncopal episode. Hx medicated HTN and diabetes. No chest complaints. EXAM: CHEST - 2 VIEW COMPARISON:  CT 07/26/2017 FINDINGS: Normal cardiac silhouette. LEFT lingular density corresponds to infiltrate on comparison CT. RIGHT lung is clear. Upper lobes are clear. IMPRESSION: Lingular pneumonia similar to CT 3 days prior. Electronically Signed   By: Genevive Bi M.D.   On: 07/29/2017 13:19        Scheduled Meds: . atorvastatin  20 mg Oral QHS  . azithromycin  500 mg Oral Daily  . benzonatate  100 mg Oral TID  . cefdinir  300 mg Oral Q12H  . feeding supplement (ENSURE ENLIVE)  237 mL Oral BID BM  . guaiFENesin-codeine  5 mL Oral Q6H  . insulin aspart  0-9 Units Subcutaneous TID WC  . levalbuterol  0.63 mg Nebulization Q6H  .  levothyroxine  100 mcg Oral QAC breakfast  . methylPREDNISolone (SOLU-MEDROL) injection  40 mg Intravenous Q12H  . metoprolol tartrate  25 mg Oral BID  . pantoprazole  40 mg Oral Daily  . Rivaroxaban  15 mg Oral BID WC  . traZODone  50 mg Oral QHS  . venlafaxine XR  150 mg Oral Q breakfast   Continuous Infusions:   LOS: 4 days    Time spent:25 mins. More than 50% of that time was spent in counseling and/or coordination of care.      Burnadette Pop, MD [facility] Pager [phone]  If 7PM-7AM, please contact night-coverage [email or web address] Password [facility] 07/30/2017, 10:15 AM

## 2017-07-30 NOTE — Care Management Important Message (Signed)
Important Message  Patient Details  Name: [name] [name] MRN: [id number] Date of Birth: 12/15/1945   Medicare Important Message Given:  Yes    [name] 07/30/2017, 3:08 PM

## 2017-07-30 NOTE — Progress Notes (Signed)
Physical Therapy Treatment Patient Details Name: [name] [name] MRN: [id number] DOB: [birth year] Today's Date: 07/30/2017    History of Present Illness Pt is a 72 y/o female thyrotoxicosis, HTN, COPD on chronic 2-3 L O2, DM, who presented to the ED with complaints of cough. Pt found to have bilateral PE with mild heart strain and started on heparin drip.    PT Comments    Pt reported no pain prior to tx session and continues to progress towards LTG's. Pt amb 150 feet with no AD and CGA provided by therapist. Pt demonstrates slow reciprocal stepping pattern, with decreased stride length and no arm swing observed. O2 upon return to room 85% with return to 90% with pursed lip breathing. Pt left in recliner with call bell and tray in reach and all needs met.    Follow Up Recommendations  No PT follow up     Equipment Recommendations  None recommended by PT    Recommendations for Other Services       Precautions / Restrictions Precautions Precautions: Fall Precaution Comments: monitor SPO2 and HR Restrictions Weight Bearing Restrictions: No    Mobility  Bed Mobility                  Transfers Overall transfer level: Modified independent Equipment used: None Transfers: Sit to/from Stand Sit to Stand: Modified independent (Device/Increase time)            Ambulation/Gait Ambulation/Gait assistance: Min guard Gait Distance (Feet): 150 Feet Assistive device: None Gait Pattern/deviations: Step-through pattern;Decreased step length - right;Decreased step length - left;Decreased stride length Gait velocity: decreased       Stairs             Wheelchair Mobility    Modified Rankin (Stroke Patients Only)       Balance Overall balance assessment: Needs assistance Sitting-balance support: No upper extremity supported;Feet supported Sitting balance-Leahy Scale: Good     Standing balance support: During functional activity;No upper extremity  supported Standing balance-Leahy Scale: Fair                              Cognition Arousal/Alertness: Awake/alert Behavior During Therapy: WFL for tasks assessed/performed Overall Cognitive Status: Within Functional Limits for tasks assessed                                        Exercises      General Comments        Pertinent Vitals/Pain Pain Assessment: No/denies pain    Home Living                      Prior Function            PT Goals (current goals can now be found in the care plan section) Acute Rehab PT Goals PT Goal Formulation: With patient Time For Goal Achievement: 08/11/17 Potential to Achieve Goals: Good Progress towards PT goals: Progressing toward goals    Frequency    Min 3X/week      PT Plan Current plan remains appropriate    Co-evaluation              AM-PAC PT "6 Clicks" Daily Activity  Outcome Measure  Difficulty turning over in bed (including adjusting bedclothes, sheets and blankets)?: A Little Difficulty moving from lying on back to  sitting on the side of the bed? : A Little Difficulty sitting down on and standing up from a chair with arms (e.g., wheelchair, bedside commode, etc,.)?: None Help needed moving to and from a bed to chair (including a wheelchair)?: None Help needed walking in hospital room?: None Help needed climbing 3-5 steps with a railing? : A Little 6 Click Score: 21    End of Session Equipment Utilized During Treatment: Gait belt;Oxygen Activity Tolerance: Patient tolerated treatment well Patient left: with call bell/phone within reach;in chair Nurse Communication: Mobility status PT Visit Diagnosis: Other abnormalities of gait and mobility (R26.89)     Time: 9758-8325 PT Time Calculation (min) (ACUTE ONLY): 10 min  Charges:  $Gait Training: 8-22 mins                    G Codes:          Floreen Comber Aug 20, 2017, 9:52 AM

## 2017-07-30 NOTE — Care Management Note (Addendum)
Case Management Note  Patient Details  Name: [name] [name] MRN: [id number] Date of Birth: 01/11/1945  Subjective/Objective: Patient admitted with pe, acute /chronic resp failure, heart rate in 140's, on lopressor, will be on xarelto, NCM gave patient the 30 day free savings coupon for xarelto and informed her of co pay amt, of 75.00.   7/23 [name] Capeeborah [name] RN, BSN- patient does not have oxygen to go home with, she has oxygen with [employer], NCM contacted [employer] and they will be bringing a oxygen tank to patient's room for her to go home with prior to dc.                  Action/Plan: NCM will follow for dc needs.  Expected Discharge Date:  07/29/17               Expected Discharge Plan:  Home/Self Care  In-House Referral:     Discharge planning Services  CM Consult, Medication Assistance  Post Acute Care Choice:    Choice offered to:     DME Arranged:    DME Agency:     HH Arranged:    HH Agency:     Status of Service:  In process, will continue to follow  If discussed at Long Length of Stay Meetings, dates discussed:    Additional Comments:  [name] [name], [name] Zumstein [name], RN 07/30/2017, 2:48 PM

## 2017-07-30 NOTE — Progress Notes (Signed)
#  2.  S/W MARIA @ [facility] # [id number]    XARELTO  15 MG BID  COVER- YES  CO-PAY- $75.31  TIER- 3 DRUG  PRIOR APPROVAL- NO   DEDUCTIBLE : NOT MET   PREFERRED PHARMACY : YES   [facility]

## 2017-07-31 LAB — CBC
HCT: 34.7 % — ABNORMAL LOW (ref 36.0–46.0)
Hemoglobin: 10.5 g/dL — ABNORMAL LOW (ref 12.0–15.0)
MCH: 28.5 pg (ref 26.0–34.0)
MCHC: 30.3 g/dL (ref 30.0–36.0)
MCV: 94 fL (ref 78.0–100.0)
Platelets: 337 10*3/uL (ref 150–400)
RBC: 3.69 MIL/uL — ABNORMAL LOW (ref 3.87–5.11)
RDW: 13.1 % (ref 11.5–15.5)
WBC: 5.2 10*3/uL (ref 4.0–10.5)

## 2017-07-31 LAB — GLUCOSE, CAPILLARY
Glucose-Capillary: 156 mg/dL — ABNORMAL HIGH (ref 70–99)
Glucose-Capillary: 168 mg/dL — ABNORMAL HIGH (ref 70–99)

## 2017-07-31 MED ORDER — GUAIFENESIN-CODEINE 100-10 MG/5ML PO SOLN: 5.0000 mL | Freq: Four times a day (QID) | ORAL | 0 refills | Status: AC | PRN

## 2017-07-31 MED ORDER — RIVAROXABAN (XARELTO) VTE STARTER PACK (15 & 20 MG): ORAL_TABLET | ORAL | 0 refills | Status: DC

## 2017-07-31 MED ORDER — PREDNISONE 20 MG PO TABS: 40.0000 mg | ORAL_TABLET | Freq: Every day | ORAL | 0 refills | Status: AC

## 2017-07-31 MED ORDER — CEFDINIR 300 MG PO CAPS: 300.0000 mg | ORAL_CAPSULE | Freq: Two times a day (BID) | ORAL | 0 refills | Status: AC

## 2017-07-31 MED ORDER — BENZONATATE 100 MG PO CAPS: 100.0000 mg | ORAL_CAPSULE | Freq: Three times a day (TID) | ORAL | 0 refills | Status: DC

## 2017-07-31 NOTE — Progress Notes (Signed)
Discharge orders reviewed with patient. Patient is alert and oriented with VS stable. Awaiting oxygen tank before discharging home. IV site removed from left arm with band-aid placed.

## 2017-07-31 NOTE — Discharge Summary (Signed)
Physician Discharge Summary  [name] Barrenatricia A [name] ZOX:[id number] DOB: 08/14/1945 DOA: 07/26/2017  PCP: Shirlean MylarWebb, Carol, MD  Admit date: 07/26/2017 Discharge date: 07/31/2017  Admitted From: Home Disposition:  Home  Discharge Condition:Stable CODE STATUS:FULL Diet recommendation: Heart Healthy  Brief/Interim Summary:  Patient is a 72 year old female with past medical history of thyrotoxicosis, hypertension, COPD on home oxygen at 3 L/min, diabetes who presented to the emergency department with complaints of cough which was nonproductive.  Patient also reported of having shortness of breath.  She also reported of passing out at home.  Work-up at the emergency department showed  large pulmonary embolism.    She was started on heparin drip which was later changed to Xarelto.  Her hospital course was remarkable for for ongoing dyspnea, cough and COPD exacerbation.  She was also treated with IV steroids for COPD exacerbation. This morning she was hemodynamically stable.  Her cough has improved.  She still has mild expiratory wheezes on auscultation.  Patient is stable for discharge to home today with oral steroids and antibiotics for pneumonia.  She will continue Xarelto as instructed.  Following problems were addressed during her hospitalization:  Acute on chronic hypoxic respiratory failure: Contributed by pulmonary embolism on the top of COPD.  She was treated with heparin drip for 48 hours.  Currently she has been started on Xarelto.  Echocardiogram did not show any evidence of right heart strain.  Lower extremity venous duplex did not show any DVT. Tachycardia has improved.TSH is stable.  Plan for discharging to home with Xarelto today.  Pneumonia: Presented with mild leukocytosis, dry cough.  CT and chest x-ray showed pneumonia.  Started on azithromycin and Omnicef.  COPD: History of chronic respiratory hypoxic respiratory failure on home oxygen.  Imrppved wheezing this morning.  Started on  Solu-Medrol.  Continue cough suppressants.  Continue supplemental oxygen.  Steroids changed to prednisone on discharge.  Diabetes type 2: On sliding scale insulin.  Tachycardia: Currently in mild sinus tachycardia.  Much improved.  Hypothyroidism: Continue thyroxine  Fall: Patient worked with physical therapy.  No specific recommendation.  She has a large right periorbital ecchymosis.     Discharge Diagnoses:  Principal Problem:   Pulmonary embolism (HCC) Active Problems:   Thyrotoxicosis   OBESITY   Essential hypertension   COPD (chronic obstructive pulmonary disease) (HCC)   Non-insulin treated type 2 diabetes mellitus [facility](HCC)    Discharge Instructions  Discharge Instructions    Diet - low sodium heart healthy   Complete by:  As directed    Discharge instructions   Complete by:  As directed    1) Follow up with your PCP in a week. Do CBC and BMP tests during the follow up. 2) Take prescribed medications as instructed.   Increase activity slowly   Complete by:  As directed      Allergies as of 07/31/2017      Reactions   Lisinopril    cough   Sulfonamide Derivatives Rash      Medication List    TAKE these medications   albuterol (2.5 MG/3ML) 0.083% nebulizer solution Commonly known as:  PROVENTIL Take 3 mLs (2.5 mg total) by nebulization every 4 (four) hours as needed for wheezing or shortness of breath (DX: COPD 496).   albuterol 108 (90 Base) MCG/ACT inhaler Commonly known as:  PROAIR HFA INHALE 2 PUFFS INTO THE LUNGS EVERY 6 (SIX) HOURS AS NEEDED FOR WHEEZING OR SHORTNESS OF BREATH.   aspirin 81 MG chewable tablet Chew 81  mg by mouth at bedtime.   atenolol 50 MG tablet Commonly known as:  TENORMIN Take 50 mg by mouth daily.   benzonatate 100 MG capsule Commonly known as:  TESSALON Take 1 capsule (100 mg total) by mouth 3 (three) times daily.   CALTRATE 600+D 600-400 MG-UNIT tablet Generic drug:  Calcium Carbonate-Vitamin D Take 2 tablets by  mouth daily.   cefdinir 300 MG capsule Commonly known as:  OMNICEF Take 1 capsule (300 mg total) by mouth 2 (two) times daily for 2 days.   CENTRUM PO Take 1 tablet by mouth daily.   fexofenadine 180 MG tablet Commonly known as:  ALLEGRA Take 180 mg by mouth daily.   fluticasone 50 MCG/ACT nasal spray Commonly known as:  FLONASE Place 2 sprays into both nostrils daily. What changed:    when to take this  reasons to take this   guaiFENesin-codeine 100-10 MG/5ML syrup Take 5 mLs by mouth every 6 (six) hours as needed for up to 7 days for cough.   levothyroxine 100 MCG tablet Commonly known as:  SYNTHROID, LEVOTHROID Take 100 mcg by mouth daily.   LIPITOR 20 MG tablet Generic drug:  atorvastatin Take 20 mg by mouth daily.   losartan 25 MG tablet Commonly known as:  COZAAR Take 25 mg by mouth daily.   metFORMIN 500 MG 24 hr tablet Commonly known as:  GLUCOPHAGE-XR Take 1 tablet (500 mg total) by mouth daily. What changed:  when to take this   naproxen sodium 220 MG tablet Commonly known as:  ALEVE Take 220 mg by mouth daily as needed (pain).   nitroGLYCERIN 0.4 MG SL tablet Commonly known as:  NITROSTAT Place 1 tablet (0.4 mg total) under the tongue every 5 (five) minutes as needed for chest pain.   omeprazole 20 MG capsule Commonly known as:  PRILOSEC Take 1 capsule (20 mg total) by mouth daily.   ONE TOUCH ULTRA TEST test strip Generic drug:  glucose blood As directed   predniSONE 20 MG tablet Commonly known as:  DELTASONE Take 2 tablets (40 mg total) by mouth daily for 5 days. Start taking on:  08/01/2017   Rivaroxaban 15 & 20 MG Tbpk Take as directed on package: Start with one 15mg  tablet by mouth twice a day with food for 18 days then switch to one 20mg  tablet once a day with food.   tiotropium 18 MCG inhalation capsule Commonly known as:  SPIRIVA Place 18 mcg into inhaler and inhale daily.   Tiotropium Bromide-Olodaterol 2.5-2.5 MCG/ACT  Aers Commonly known as:  STIOLTO RESPIMAT Inhale 2 puffs into the lungs daily.   traZODone 50 MG tablet Commonly known as:  DESYREL Take 50 mg by mouth at bedtime.   venlafaxine XR 150 MG 24 hr capsule Commonly known as:  EFFEXOR-XR Take 1 capsule by mouth daily.   vitamin C 1000 MG tablet Take 1,000 mg by mouth daily.      Follow-up Information    Shirlean Mylar, MD. Schedule an appointment as soon as possible for a visit in 1 week(s).   Specialty:  Family Medicine Contact information: [address] [place] [address] [phone]          Allergies  Allergen Reactions  . Lisinopril     cough  . Sulfonamide Derivatives Rash    Consultations: None  Procedures/Studies: Dg Chest 2 View  Result Date: 07/29/2017 CLINICAL DATA:  Pt states ongoing SOB due to COPD. Currently here for syncopal episode.  Hx medicated HTN and diabetes. No chest complaints. EXAM: CHEST - 2 VIEW COMPARISON:  CT 07/26/2017 FINDINGS: Normal cardiac silhouette. LEFT lingular density corresponds to infiltrate on comparison CT. RIGHT lung is clear. Upper lobes are clear. IMPRESSION: Lingular pneumonia similar to CT 3 days prior. Electronically Signed   By: Genevive Bi M.D.   On: 07/29/2017 13:19   Ct Head Wo Contrast  Result Date: 07/26/2017 CLINICAL DATA:  Altered level of consciousness EXAM: CT HEAD WITHOUT CONTRAST TECHNIQUE: Contiguous axial images were obtained from the base of the skull through the vertex without intravenous contrast. COMPARISON:  None. FINDINGS: Brain: No acute intracranial abnormality. Specifically, no hemorrhage, hydrocephalus, mass lesion, acute infarction, or significant intracranial injury. Mild age related volume loss. Vascular: No hyperdense vessel or unexpected calcification. Skull: No acute calvarial abnormality. Sinuses/Orbits: Visualized paranasal sinuses and mastoids clear. Orbital soft tissues unremarkable. Other: None IMPRESSION: No acute  intracranial abnormality. Electronically Signed   By: Charlett Nose M.D.   On: 07/26/2017 18:09   Ct Angio Chest Pe W And/or Wo Contrast  Result Date: 07/26/2017 CLINICAL DATA:  Shella Spearing the right forehead and possible pneumonia. Known witnessed syncopal episode last evening. EXAM: CT ANGIOGRAPHY CHEST WITH CONTRAST TECHNIQUE: Multidetector CT imaging of the chest was performed using the standard protocol during bolus administration of intravenous contrast. Multiplanar CT image reconstructions and MIPs were obtained to evaluate the vascular anatomy. CONTRAST:  60mL ISOVUE-370 IOPAMIDOL (ISOVUE-370) INJECTION 76% COMPARISON:  04/08/2016 CXR, cardiac CT 02/06/2017 FINDINGS: Cardiovascular: Conventional branch pattern of the great vessels with atherosclerosis. Preferential opacification of the pulmonary arterial system, adequate for assessment to the level of the distal lobar and segmental arteries. Small pulmonary emboli to the lower lobes are noted within the distal lobar and proximal segmental pulmonary arteries. RV/LV ratio 1. No pericardial effusion. Atherosclerotic nonaneurysmal thoracic aorta. No dissection. Left main and three-vessel coronary arteriosclerosis. Mediastinum/Nodes: Small cluster of mildly enlarged left lower paratracheal lymph nodes measuring up to 13 mm short axis likely reactive. Small bilateral hilar lymph nodes without pathologic enlargement. Esophagus, trachea and mainstem bronchi are unremarkable. There is mild lower lobe peribronchial thickening. Lungs/Pleura: Pulmonary consolidation noted in the lingula suspicious for pneumonia and/or atelectasis. No effusion or pneumothorax. No dominant mass. Bilateral tree-in-bud like opacities in the lower lobes consistent with bronchiolitis. Subsegmental atelectasis in the periphery of the left lower lobe. Upper Abdomen: No acute abnormality. Musculoskeletal: No chest wall abnormality. No acute or significant osseous findings. Review of the MIP images  confirms the above findings. IMPRESSION: 1. Bilateral lower lobe pulmonary emboli with mild right heart strain, RV/LV ratio 1. Positive for acute PE with CT evidence of right heart strain (RV/LV Ratio = 1) consistent with at least submassive (intermediate risk) PE. The presence of right heart strain has been associated with an increased risk of morbidity and mortality. Please activate Code PE by paging [phone]. These results were called by telephone at the time of interpretation on 07/26/2017 at 6:21 pm to Dr. Loren Racer , who verbally acknowledged these results. 2. Nonaneurysmal atherosclerotic aorta without dissection. 3. Left main and three-vessel coronary arteriosclerosis. 4. Pulmonary consolidation in the lingula suspicious for pneumonia with reactive mediastinal adenopathy. 5. Tree-in-bud opacities in the lower lobes be keeping with bronchiolitis. Aortic Atherosclerosis (ICD10-I70.0). Electronically Signed   By: Tollie Eth M.D.   On: 07/26/2017 18:23       Subjective: Patient seen and examined the bedside this morning.  Her cough has improved today.  She does not look dyspneic.  States there is mild expiratory wheezes but stable to be discharged home.  Mild expiratory wheezes bilaterally.  Discharge Exam: Vitals:   07/31/17 0752 07/31/17 0843  BP: 111/60   Pulse: 89 (!) 106  Resp: 16 18  Temp: 98.5 F (36.9 C)   SpO2: 100% 98%   Vitals:   07/31/17 0457 07/31/17 0715 07/31/17 0752 07/31/17 0843  BP: 116/73  111/60   Pulse: 92  89 (!) 106  Resp: 20 (!) 21 16 18   Temp: 97.9 F (36.6 C)  98.5 F (36.9 C)   TempSrc: Oral  Oral   SpO2:   100% 98%  Weight:      Height:        General: Pt is alert, awake, not in acute distress Cardiovascular: RRR, S1/S2 +, no rubs, no gallops Respiratory: Mild expiratory wheezes bilaterally  abdominal: Soft, NT, ND, bowel sounds + Extremities: no edema, no cyanosis    The results of significant diagnostics from this hospitalization  (including imaging, microbiology, ancillary and laboratory) are listed below for reference.     Microbiology: Recent Results (from the past 240 hour(s))  MRSA PCR Screening     Status: None   Collection Time: 07/26/17 10:39 PM  Result Value Ref Range Status   MRSA by PCR NEGATIVE NEGATIVE Final    Comment:        The GeneXpert MRSA Assay (FDA approved for NASAL specimens only), is one component of a comprehensive MRSA colonization surveillance program. It is not intended to diagnose MRSA infection nor to guide or monitor treatment for MRSA infections. Performed at [facility], [address], [place], [place] [address]      Labs: BNP (last 3 results) No results for input(s): BNP in the last 8760 hours. Basic Metabolic Panel: Recent Labs  Lab 07/26/17 1529 07/30/17 0227  NA 140 140  K 4.5 4.0  CL 102 101  CO2 28 30  GLUCOSE 116* 146*  BUN 11 8  CREATININE 1.02* 0.94  CALCIUM 8.9 9.2  MG  --  1.9   Liver Function Tests: No results for input(s): AST, ALT, ALKPHOS, BILITOT, PROT, ALBUMIN in the last 168 hours. No results for input(s): LIPASE, AMYLASE in the last 168 hours. No results for input(s): AMMONIA in the last 168 hours. CBC: Recent Labs  Lab 07/27/17 0302 07/28/17 0255 07/29/17 0230 07/30/17 0227 07/31/17 0225  WBC 10.7* 9.1 7.8 6.2 5.2  HGB 12.0 11.0* 11.1* 11.3* 10.5*  HCT 39.0 36.2 36.1 36.7 34.7*  MCV 94.0 93.8 93.3 93.9 94.0  PLT 230 245 267 303 337   Cardiac Enzymes: Recent Labs  Lab 07/26/17 1529  TROPONINI <0.03   BNP: Invalid input(s): POCBNP CBG: Recent Labs  Lab 07/30/17 0758 07/30/17 1203 07/30/17 1657 07/30/17 2217 07/31/17 0751  GLUCAP 150* 174* 167* 162* 156*   D-Dimer No results for input(s): DDIMER in the last 72 hours. Hgb A1c No results for input(s): HGBA1C in the last 72 hours. Lipid Profile No results for input(s): CHOL, HDL, LDLCALC, TRIG, CHOLHDL, LDLDIRECT in the last 72 hours. Thyroid function  studies No results for input(s): TSH, T4TOTAL, T3FREE, THYROIDAB in the last 72 hours.  Invalid input(s): FREET3 Anemia work up No results for input(s): VITAMINB12, FOLATE, FERRITIN, TIBC, IRON, RETICCTPCT in the last 72 hours. Urinalysis    Component Value Date/Time   COLORURINE YELLOW 07/26/2017 2248   APPEARANCEUR CLEAR 07/26/2017 2248   LABSPEC 1.031 (H) 07/26/2017 2248   PHURINE 6.0 07/26/2017 2248  GLUCOSEU NEGATIVE 07/26/2017 2248   HGBUR NEGATIVE 07/26/2017 2248   BILIRUBINUR NEGATIVE 07/26/2017 2248   KETONESUR NEGATIVE 07/26/2017 2248   PROTEINUR NEGATIVE 07/26/2017 2248   NITRITE NEGATIVE 07/26/2017 2248   LEUKOCYTESUR SMALL (A) 07/26/2017 2248   Sepsis Labs Invalid input(s): PROCALCITONIN,  WBC,  LACTICIDVEN Microbiology Recent Results (from the past 240 hour(s))  MRSA PCR Screening     Status: None   Collection Time: 07/26/17 10:39 PM  Result Value Ref Range Status   MRSA by PCR NEGATIVE NEGATIVE Final    Comment:        The GeneXpert MRSA Assay (FDA approved for NASAL specimens only), is one component of a comprehensive MRSA colonization surveillance program. It is not intended to diagnose MRSA infection nor to guide or monitor treatment for MRSA infections. Performed at [facility], [address] [place], [place] [address]     Please note: You were cared for by a hospitalist during your hospital stay. Once you are discharged, your primary care physician will handle any further medical issues. Please note that NO REFILLS for any discharge medications will be authorized once you are discharged, as it is imperative that you return to your primary care physician (or establish a relationship with a primary care physician if you do not have one) for your post hospital discharge needs so that they can reassess your need for medications and monitor your lab values.    Time coordinating discharge: 40 minutes  SIGNED:   Burnadette Pop, MD  [facility] 07/31/2017, 10:58 AM Pager [phone]  If 7PM-7AM, please contact night-coverage [email or web address] Password [email or web address]

## 2017-08-02 ENCOUNTER — Other Ambulatory Visit: Payer: Self-pay

## 2017-08-02 NOTE — Patient Outreach (Signed)
[facility] Care Management  08/02/2017  [name] [name] 05/25/1945 [id number]   EMMI: COPD red alert Referral date: 08/02/17 Referral reason: feeling worse overall, harder to breath, coughing more than yesterday, mucous color change: YES Insurance: [employer] Day # 1  Telephone call to patient regarding EMMI COPD red alert. HIPAA verified with patient. Explained reason for call. Patient states she was in the hospital due to her COPD and pneumonia. States this is the first time she has been admitted due to her COPD.  Patient reports she has had COPD since 2012.   Patient states she was having trouble breathing on yesterday.  States she feels it is better today. Patient states she is wearing her oxygen at 3 liters and using her nebulizer 4 times per day. Patient reports she is also using her inhaler as needed.   Patient states she has her medications and is taking them as prescribed. Patient reports she is taking an antibiotic for pneumonia.  RNCM advised patient to take the antibiotic as directed and complete prescribed dose even if she starts to feel better. Patient verbalized understanding.  RNCM advised patient to call her primary MD if she does not continue to feel any better.  Patient states she has an appointment scheduled with her primary MD for 7/ 30/19.  Patient denies having home health services.  RNCM [facility] and [facility]. Patient verbally agreed.   Patient states she will be going out of the state on 08/09/17 for a while.  Patient states she has her portable oxygen and will take her nebulizer equipment with her.  RNCM advised patient to notify MD of any changes in condition prior to scheduled appointment. RNCM provided contact name and number: [phone] or main office number [phone] and 24 hour nurse advise line [phone].  RNCM verified patient aware of 911 services for urgent/ emergent needs.  PLAN; RNCM will refer patient to  RN health coach   George InaDavina Tanner Vigna RN,BSN,CCM [facility] Telephonic  [phone]     PLAN:

## 2017-08-07 DIAGNOSIS — J449 Chronic obstructive pulmonary disease, unspecified: Secondary | ICD-10-CM | POA: Diagnosis not present

## 2017-08-07 DIAGNOSIS — J189 Pneumonia, unspecified organism: Secondary | ICD-10-CM | POA: Diagnosis not present

## 2017-08-07 DIAGNOSIS — Z09 Encounter for follow-up examination after completed treatment for conditions other than malignant neoplasm: Secondary | ICD-10-CM | POA: Diagnosis not present

## 2017-08-07 DIAGNOSIS — I2699 Other pulmonary embolism without acute cor pulmonale: Secondary | ICD-10-CM | POA: Diagnosis not present

## 2017-08-08 ENCOUNTER — Encounter: Payer: Self-pay | Admitting: *Deleted

## 2017-08-08 DIAGNOSIS — J449 Chronic obstructive pulmonary disease, unspecified: Secondary | ICD-10-CM | POA: Diagnosis not present

## 2017-08-13 ENCOUNTER — Other Ambulatory Visit: Payer: Self-pay | Admitting: *Deleted

## 2017-08-13 NOTE — Patient Outreach (Signed)
[facility]) Care Management  08/13/2017  [name] [name] 08/10/1945 [id number]   RN Health Coach Introduction Call  Referral Date:  08/02/2017 Referral Source:  EMMI COPD Red Alert Screening Reason for Referral:  Disease Management Education Insurance:  [facility]   Outreach Attempt:  Outreach attempt #1 to patient for introduction call. No answer. RN Health Coach left HIPAA compliant voicemail message along with contact information.  Plan:  RN Health Coach will send unsuccessful outreach letter to patient.  RN Health Coach will make another outreach attempt to patient within 3-4 business days if no return call back from patient.   Rhae LernerFarrah Waynesha Rammel RN [facility] Care Management  RN Health Coach [phone] [email or web address]

## 2017-08-16 ENCOUNTER — Other Ambulatory Visit: Payer: Self-pay | Admitting: *Deleted

## 2017-08-16 NOTE — Patient Outreach (Signed)
[facility]) Care Management  08/16/2017  [name] 07/16/1945 [id number]   RN Health Coach Introduction Call  Referral Date:  08/02/2017 Referral Source:  EMMI COPD Red Alert Screening Reason for Referral:  Disease Management Education Insurance:  [facility]   Outreach Attempt:  Outreach attempt #2 to patient for introductory call. No answer. RN Health Coach left HIPAA compliant voicemail message along with contact information.  Plan:  RN Health Coach will make another outreach attempt to patient within 3-4 business days if no return call back from patient.  [name] LernerFarrah Alilah Mcmeans RN [facility] Care Management  RN Health Coach [phone] [name]

## 2017-08-20 ENCOUNTER — Other Ambulatory Visit: Payer: Self-pay | Admitting: *Deleted

## 2017-08-20 NOTE — Patient Outreach (Signed)
[facility]N) Care Management  08/20/2017  [name] [name] 10/30/1945 [id number]   RN Health CoachIntroduction Call  Referral Date:08/02/2017 Referral Source:EMMI COPD Red Alert Screening Reason for Referral:Disease Management Education Insurance:[employer] Medicare   Outreach Attempt:  Successful telephone outreach to patient for introduction call.  HIPAA verified with patient.  RN Health Coach introduced self and role.  Patient verbally agrees to monthly telephone outreaches.  Plan:  RN Health Coach will make outreach attempt to complete initial telephone assessment within the next 10 business days.  [name] LernerFarrah Arnola Crittendon RN [facility] Care Management  RN Health Coach [phone] [name]

## 2017-08-27 ENCOUNTER — Encounter: Payer: Self-pay | Admitting: *Deleted

## 2017-08-27 ENCOUNTER — Other Ambulatory Visit: Payer: Self-pay | Admitting: *Deleted

## 2017-08-27 NOTE — Patient Outreach (Signed)
[facility]N) Care Management  [facility]  08/27/2017   Festus Barrenatricia A Todorov 04/22/1945 [id number]   RN Health Coach Initial Assessment  Referral Date:  08/02/2017 Referral Source:  EMMI COPD Red Alert Screening Reason for Referral:  Disease Management Education Insurance:  [facility]   Outreach Attempt:  Successful telephone outreach to patient for initial telephone assessment.  HIPAA verified with patient.  Patient completed initial telephone assessment.  Social:   Patient lives at home alone.  Reports being independent with ADLs and IADLs.  Ambulates independently and reporting 1 fall in the last 3 months without injury (syncopal episode leading to last hospitalization).  Drives self to medical appointments.  DME in the home include:  Oxygen (portable and concentrator), nebulizer, built in shower seat, CBG meter, reading glasses, upper and lower dentures, bilateral hearing aides, and scale.  Conditions:  Per chart review and discussion with patient, PMH include but not limited to:  COPD with chronic home oxygen, bronchitis, thyrotoxicosis, hypertension, diabetes, coronary artery disease, depression, nephritis, and nephropathy.  Patient reports recent diagnosis of pulmonary emboli on Xarelto.  States compliance with Xarelto.  Reports wearing home oxygen PRN during the day and PRN at night.  Patient is stating she has a dry nonproductive cough and trying to get a cough syrup to help with the cough.  Denies any increase shortness of breath and reports she is using her rescue nebulizer/inhaler about twice a day (which she states is her usual).  Encouraged to contact her physician if her cough continues with no relief from cough medicines.  Monitors blood sugars daily.  Fasting blood sugar this morning was 136 with ranges of 110-120's.  Hgb A1C was 7.4 on 07/03/2017.  Medications:  Patient reports taking about 10 medications.  Manages her medications herself with a weekly  pill box fill.  States she was changed to a new maintenance inhaler, Stiolto Respimat, but has not been able to afford the co pay.  Reports she is using samples that will run out soon and then will switch back to Spiriva.  D[facility] and [facility] and patient verbally agrees to referral.  Reports she is now in the donut hole and is unsure of her co pays coming up.  Report co pay for her Pro air rescue inhaler is $60 for 3 months and Stiolto Respimat $200 a month prior to donut hole.  Encounter Medications:  Outpatient Encounter Medications as of 08/27/2017  Medication Sig Note  . albuterol (PROAIR HFA) 108 (90 Base) MCG/ACT inhaler INHALE 2 PUFFS INTO THE LUNGS EVERY 6 (SIX) HOURS AS NEEDED FOR WHEEZING OR SHORTNESS OF BREATH.   Marland Kitchen. albuterol (PROVENTIL) (2.5 MG/3ML) 0.083% nebulizer solution Take 3 mLs (2.5 mg total) by nebulization every 4 (four) hours as needed for wheezing or shortness of breath (DX: COPD 496).   . Ascorbic Acid (VITAMIN C) 1000 MG tablet Take 1,000 mg by mouth daily.     Marland Kitchen. aspirin 81 MG chewable tablet Chew 81 mg by mouth at bedtime.    Marland Kitchen. atenolol (TENORMIN) 50 MG tablet Take 50 mg by mouth daily.     Marland Kitchen. atorvastatin (LIPITOR) 20 MG tablet Take 20 mg by mouth daily.     . Calcium Carbonate-Vitamin D (CALTRATE 600+D) 600-400 MG-UNIT per tablet Take 2 tablets by mouth daily.     . fexofenadine (ALLEGRA) 180 MG tablet Take 180 mg by mouth daily.   . fluticasone (FLONASE) 50 MCG/ACT nasal spray Place 2 sprays into  both nostrils daily. (Patient taking differently: Place 2 sprays into both nostrils daily as needed for allergies or rhinitis. )   . levothyroxine (SYNTHROID, LEVOTHROID) 100 MCG tablet Take 100 mcg by mouth daily.   Marland Kitchen. losartan (COZAAR) 25 MG tablet Take 25 mg by mouth daily.   . metFORMIN (GLUCOPHAGE-XR) 500 MG 24 hr tablet Take 1 tablet (500 mg total) by mouth daily. (Patient taking differently: Take 500 mg by mouth 2 (two) times daily. )   . Multiple  Vitamins-Minerals (CENTRUM PO) Take 1 tablet by mouth daily.     . naproxen sodium (ALEVE) 220 MG tablet Take 220 mg by mouth daily as needed (pain).   Marland Kitchen. omeprazole (PRILOSEC) 20 MG capsule Take 1 capsule (20 mg total) by mouth daily.   . ONE TOUCH ULTRA TEST test strip As directed   . Rivaroxaban 15 & 20 MG TBPK Take as directed on package: Start with one 15mg  tablet by mouth twice a day with food for 18 days then switch to one 20mg  tablet once a day with food. 08/27/2017: Reports taking 20 mg once a day  . tiotropium (SPIRIVA) 18 MCG inhalation capsule Place 18 mcg into inhaler and inhale daily.   08/27/2017: Reports will start back taking once out of samples of Stiolto Respimat  . Tiotropium Bromide-Olodaterol (STIOLTO RESPIMAT) 2.5-2.5 MCG/ACT AERS Inhale 2 puffs into the lungs daily. 08/27/2017: Taking samples currently and then will run out  . traZODone (DESYREL) 50 MG tablet Take 50 mg by mouth at bedtime.    Marland Kitchen. venlafaxine XR (EFFEXOR-XR) 150 MG 24 hr capsule Take 1 capsule by mouth daily.   . benzonatate (TESSALON) 100 MG capsule Take 1 capsule (100 mg total) by mouth 3 (three) times daily. (Patient not taking: Reported on 08/27/2017) 08/27/2017: Reports completing prescription  . nitroGLYCERIN (NITROSTAT) 0.4 MG SL tablet Place 1 tablet (0.4 mg total) under the tongue every 5 (five) minutes as needed for chest pain.    No facility-administered encounter medications on file as of 08/27/2017.     Functional Status:  In your present state of health, do you have any difficulty performing the following activities: 08/27/2017 07/26/2017  Hearing? Y Y  Comment bilateral hearing aides -  Vision? Y N  Comment reading glasses -  Difficulty concentrating or making decisions? N N  Walking or climbing stairs? N N  Dressing or bathing? N N  Doing errands, shopping? N N  Preparing Food and eating ? N -  Using the Toilet? N -  In the past six months, have you accidently leaked urine? N -  Do you have  problems with loss of bowel control? N -  Managing your Medications? N -  Managing your Finances? N -  Housekeeping or managing your Housekeeping? N -  Some recent data might be hidden    Fall/Depression Screening: Fall Risk  08/27/2017  Falls in the past year? Yes  Number falls in past yr: 1  Injury with Fall? No  Risk for fall due to : Impaired balance/gait;History of fall(s);Medication side effect  Follow up Education provided;Falls prevention discussed   PHQ 2/9 Scores 08/27/2017  PHQ - 2 Score 0    [facility] CM Care Plan Problem One     Most Recent Value  Care Plan Problem One  Knowledge deficiet related to self care management of COPD.  Role Documenting the Problem One  Health Coach  Care Plan for Problem One  Active  [facility] Long [facility] Goal   Patient  will report no hospitalizations in the next 90 days.  [facility] Long Term Goal Start Date  08/27/17  Interventions for Problem One Long Term Goal  Reviewed and discussed current care plan and goals, encouraged to keep and attend scheduled medical appointments, reviewed medications and encouraged compliance, discussed current Hgb A1C and discussed healthier diet to help reduce A1C, encouraged healthier meal and drink options, discussed pulmonary emboli and xarelto, sending COPD education packet, reviewed COPD zones and action plan, [facility] Pharmacy referral for medication assistance  [facility] CM Short Term Goal #1   Patient will report clearing of nonproductive cough in the next 30 days.  [facility] CM Short Term Goal #1 Start Date  08/27/17  Interventions for Short Term Goal #1  Discussed with patient cough medicines, encouraged patient to verify refills on tessalon pearls, encouraged patient to contact physicians if cough does not go away in the next couple of days  [facility] CM Short Term Goal #2   Patient will schedule follow up appointment with Dr. Delton Coombes for October in the next 30 days.  [facility] CM Short Term Goal #2 Start Date  08/27/17  Interventions for Short Term Goal  #2  Reviewed with patient pulmonary follow up appointment should be scheduled four months from previous appointment around October, discussed with patient appointment has not been scheduled yet, encouraged patient to call pulmonary office to schedule 4 month follow up     Appointments:  Patient attended post hospitalization medical appointment with primary care provider, Dr. Hyman Hopes on 08/07/2017 and reports scheduled follow up in January 2019.  Last seen Dr. Delton Coombes, Pulmonologist on 07/02/2017 and needs to schedule follow up in October.  Encouraged to schedule pulmonology appointment.  Advanced Directives:  Patient has Living Will and Health Care Power of Attorney in place and does not wish to make any changes to them.   Consent:  [facility] services reviewed and discussed with patient.  Patient verbally agrees to monthly telephone outreaches and [facility] Pharmacy referral for medication assistance.  Plan: RN Health Coach will make next monthly outreach to patient in the month of September. RN Health Coach will send primary MD barriers letter. RN Health Coach will route initial telephone assessment note to primary MD. RN Health Coach will send patient [facility] Welcome Packet. RN Health Coach will send patient 2019 Calendar Booklet. RN Health Coach will send patient COPD Education Packet. RN Health Coach will send [facility] pharmacy referral for medication assistance.  Rhae Lerner RN [facility] Care Management  RN Health Coach [phone] [email or web address]

## 2017-08-28 ENCOUNTER — Other Ambulatory Visit: Payer: Self-pay | Admitting: Pharmacy Technician

## 2017-08-28 ENCOUNTER — Telehealth: Payer: Self-pay | Admitting: Pharmacist

## 2017-08-28 NOTE — Patient Outreach (Signed)
[facility]) Care Management  [facility]   08/28/2017  Festus Barrenatricia A Markman 04/11/1945 [id number]  Subjective: Patient was called regarding medication assistance per referral. HIPAA identifiers were obtained x 2.  Patient is a 72 year old female with multiple medical conditions including but not limited to:  Pulmonary embolus, CAD, type 2 diabetes, thyrotoxicosis, GERD, dyslipidemia, obesity, depression, hypertension, allergic rhinitis, and COPD.  Patient was hospitalized for PE 07/26/17.   Patient reported issues affording Stiloto.  Objective:  HgA1c-7.4% TSH-2.150 K-4.5 Scr-1.02  Encounter Medications: Outpatient Encounter Medications as of 08/28/2017  Medication Sig Note  . albuterol (PROAIR HFA) 108 (90 Base) MCG/ACT inhaler INHALE 2 PUFFS INTO THE LUNGS EVERY 6 (SIX) HOURS AS NEEDED FOR WHEEZING OR SHORTNESS OF BREATH.   Marland Kitchen. albuterol (PROVENTIL) (2.5 MG/3ML) 0.083% nebulizer solution Take 3 mLs (2.5 mg total) by nebulization every 4 (four) hours as needed for wheezing or shortness of breath (DX: COPD 496).   . Ascorbic Acid (VITAMIN C) 1000 MG tablet Take 1,000 mg by mouth daily.     Marland Kitchen. aspirin 81 MG chewable tablet Chew 81 mg by mouth at bedtime.    Marland Kitchen. atenolol (TENORMIN) 50 MG tablet Take 50 mg by mouth daily.     Marland Kitchen. atorvastatin (LIPITOR) 20 MG tablet Take 20 mg by mouth daily.     . Calcium Carbonate-Vitamin D (CALTRATE 600+D) 600-400 MG-UNIT per tablet Take 2 tablets by mouth daily.     . fexofenadine (ALLEGRA) 180 MG tablet Take 180 mg by mouth daily.   . fluticasone (FLONASE) 50 MCG/ACT nasal spray Place 2 sprays into both nostrils daily. (Patient taking differently: Place 2 sprays into both nostrils daily as needed for allergies or rhinitis. )   . levothyroxine (SYNTHROID, LEVOTHROID) 100 MCG tablet Take 100 mcg by mouth daily.   Marland Kitchen. losartan (COZAAR) 25 MG tablet Take 25 mg by mouth daily.   . metFORMIN (GLUCOPHAGE-XR) 500 MG 24 hr tablet Take 1 tablet (500  mg total) by mouth daily. (Patient taking differently: Take 500 mg by mouth 2 (two) times daily. )   . Multiple Vitamins-Minerals (CENTRUM PO) Take 1 tablet by mouth daily.     . naproxen sodium (ALEVE) 220 MG tablet Take 220 mg by mouth daily as needed (pain).   Marland Kitchen. omeprazole (PRILOSEC) 20 MG capsule Take 1 capsule (20 mg total) by mouth daily.   Marland Kitchen. tiotropium (SPIRIVA) 18 MCG inhalation capsule Place 18 mcg into inhaler and inhale daily.   08/27/2017: Reports will start back taking once out of samples of Stiolto Respimat  . Tiotropium Bromide-Olodaterol (STIOLTO RESPIMAT) 2.5-2.5 MCG/ACT AERS Inhale 2 puffs into the lungs daily. 08/27/2017: Taking samples currently and then will run out  . traZODone (DESYREL) 50 MG tablet Take 50 mg by mouth at bedtime.    Marland Kitchen. venlafaxine XR (EFFEXOR-XR) 150 MG 24 hr capsule Take 1 capsule by mouth daily.   [name]. XARELTO 20 MG TABS tablet Take 1 tablet by mouth daily.   . nitroGLYCERIN (NITROSTAT) 0.4 MG SL tablet Place 1 tablet (0.4 mg total) under the tongue every 5 (five) minutes as needed for chest pain.   . ONE TOUCH ULTRA TEST test strip As directed   . [DISCONTINUED] benzonatate (TESSALON) 100 MG capsule Take 1 capsule (100 mg total) by mouth 3 (three) times daily. (Patient not taking: Reported on 08/27/2017) 08/27/2017: Reports completing prescription  . [DISCONTINUED] Rivaroxaban 15 & 20 MG TBPK Take as directed on package: Start with one 15mg  tablet  by mouth twice a day with food for 18 days then switch to one 20mg  tablet once a day with food. (Patient not taking: Reported on 08/28/2017) 08/27/2017: Reports taking 20 mg once a day   No facility-administered encounter medications on file as of 08/28/2017.     Functional Status: In your present state of health, do you have any difficulty performing the following activities: 08/27/2017 07/26/2017  Hearing? Y Y  Comment bilateral hearing aides -  Vision? Y N  Comment reading glasses -  Difficulty concentrating or  making decisions? N N  Walking or climbing stairs? N N  Dressing or bathing? N N  Doing errands, shopping? N N  Preparing Food and eating ? N -  Using the Toilet? N -  In the past six months, have you accidently leaked urine? N -  Do you have problems with loss of bowel control? N -  Managing your Medications? N -  Managing your Finances? N -  Housekeeping or managing your Housekeeping? N -  Some recent data might be hidden    Fall/Depression Screening: Fall Risk  08/27/2017  Falls in the past year? Yes  Number falls in past yr: 1  Injury with Fall? No  Risk for fall due to : Impaired balance/gait;History of fall(s);Medication side effect  Follow up Education provided;Falls prevention discussed   PHQ 2/9 Scores 08/27/2017  PHQ - 2 Score 0    Assessment: Patient's medications were reviewed via telephone.   Drugs sorted by system:  Neurologic/Psychologic: Venlafaxine ER, trazodone  Cardiovascular: Aspirin, Atenolol,  Atorvastatin, Losartan, Nitroglycerin, Xarelto,  Pulmonary/Allergy: ProAir, albuterol nebulizer solution, fexofenadine, Fluticasone, Spiriva/Stiolto,  Gastrointestinal: Omeprazole,  Endocrine: Levothyroxine, Metformin,   Pain: Naproxen,   Vitamins/Minerals: Ascorbic Acid, Calcium Carbonate/vitamin D,     Drug interactions:  Increased bleeding risk with Xarelto/ASA/Naproxen   Medication Assistance Findings:  -Patient has [employer] Medicare Advantage  -She is over income for Extra Help -After initial financial assessment, patient may qualify for patient assistance programs providing:Xarelto (Johnson & Laural BenesJohnson), Stiolto ([employer]oehringer Ingelheim), and Pilgrim's PrideProventil (if substituted for RadioShackProAir) Merck.  -Patient communicated understanding that she will need to provide a copy of her 2018 tax return, a copy of her [facility] from [facility] and a hardship letter for [employer] since she has not spent the required 4% of her income in medication expenses.  [employer]  was called and the following information was gathered:  $337.21---TROOP $763.56 away from the coverage gap  $1253.13--actual cost of Stiolto 90 day supply copay >$250 $1353.00--actual cost of Spiriva copay >$250 $1411.60--actual cost of Xarelto Copay for 90 day supply  $292.00 $45.00 for a 30 day supply   Plan: Route note to Lilla ShookAshley Coleman to send patient assistance applications to the patient.  Follow up with the patient in 10-14 days to see if she received the application.  Route note to patient's providers to give them the heads up.  Beecher McardleKatina J. Keitra Carusone, PharmD, BCACP [facility] Clinical Pharmacist [phone]

## 2017-08-28 NOTE — Patient Outreach (Signed)
[facility]N) Care Management  08/28/2017  [name] [name] 12/30/1945 [id number]   Received Boehringer-Ingelheim, [name] & Johnson, and Merck patient assistance referral from [facility] Nunzio CobbsKatina Boyd for SCANA CorporationStiolto, Xarelto, and Proventil HFA. Prepared patient portions to be mailed and faxed provider portions to [facility](Stiolto) and Dr. Hyman HopesWebb (Xarelto). Sent Dr. Delton CoombesByrum inter office mail with provider portion of Merck application (Proventil [facility]).  Will follow up with patient in 5-7 business days to confirm applications have been received.  Suzan SlickAshley N. Ernesta Ambleoleman, CPhT [facility] Care Management [phone]

## 2017-09-08 DIAGNOSIS — J449 Chronic obstructive pulmonary disease, unspecified: Secondary | ICD-10-CM | POA: Diagnosis not present

## 2017-09-13 ENCOUNTER — Other Ambulatory Visit: Payer: Self-pay | Admitting: Pharmacy Technician

## 2017-09-13 ENCOUNTER — Telehealth: Payer: Self-pay | Admitting: Emergency Medicine

## 2017-09-13 NOTE — Patient Outreach (Signed)
[facility]  09/13/2017  [name] [name] 1945/03/03 [id number]   Received patient portion of patient assistance applications, however patients EOB was not included. Mailed out Merck patient assistance application because the EOB document is not required for processing.  Will follow up with Merck in 10-14 business days to check status of application.   ADDENDUM 09/14/2017  Received patients EOB document, Faxed completed application for Xarelto and required documents to [employer].  Will follow up with company in 10-14 business days to check status of application.   Re-Faxed provider portion of B-I application for Stiolto to Dr. Delton Coombes to complete.  Once provider portion of application has been received, will submit to Boehringer-Ingelheim.  [name] Ernesta Amble [facility] Care Management [phone]

## 2017-09-13 NOTE — Telephone Encounter (Signed)
Received call from [facility] with [facility], who is requesting update on patient assistance forms.  I have located forms and personally handed them to [place].  Will route to [place] to f/u on.

## 2017-09-13 NOTE — Telephone Encounter (Signed)
Forms have been filled out and faxed back to [place].

## 2017-09-17 ENCOUNTER — Ambulatory Visit: Payer: Self-pay | Admitting: Pharmacist

## 2017-09-21 ENCOUNTER — Other Ambulatory Visit: Payer: Self-pay | Admitting: Pharmacy Technician

## 2017-09-21 NOTE — Patient Outreach (Signed)
[facility]) Care Management  09/21/2017  [name] 03/19/1945 [id number]   Received provider portion of [employer] patient assistance application for Stiolto. Faxed completed application and required documents to [employer]-I.  Will follow up with company in  7-10 business days to check status of application.  Suzan SlickAshley N. Ernesta Ambleoleman, CPhT [facility] Care Management [phone]

## 2017-09-24 ENCOUNTER — Ambulatory Visit: Payer: Self-pay | Admitting: Pharmacist

## 2017-09-26 ENCOUNTER — Other Ambulatory Visit: Payer: Self-pay | Admitting: Pharmacy Technician

## 2017-09-26 NOTE — Patient Outreach (Signed)
[facility]) Care Management  09/26/2017  Festus Barrenatricia A Mcbrayer 12/08/1945 [id number]   Follow up call to [employer]ck patient assistance to check status of patients application for Proventil HFA. Arline AspCindy stated that it had been received and that attestation letter had been mailed out to patient on 9/13.  Successful outreach call to patient, HIPAA identifiers verified. Informed Mrs. [name] [name] that attestation letter had been mailed to her from [employer] and requested that she call me when she receives it so that I can help her fill out required information.  Suzan SlickAshley N. Ernesta Ambleoleman, CPhT [facility] Care Management [phone]

## 2017-10-02 ENCOUNTER — Other Ambulatory Visit: Payer: Self-pay | Admitting: Family Medicine

## 2017-10-02 DIAGNOSIS — Z1231 Encounter for screening mammogram for malignant neoplasm of breast: Secondary | ICD-10-CM

## 2017-10-03 ENCOUNTER — Encounter: Payer: Self-pay | Admitting: *Deleted

## 2017-10-03 ENCOUNTER — Other Ambulatory Visit: Payer: Self-pay | Admitting: *Deleted

## 2017-10-03 NOTE — Patient Outreach (Signed)
[facility]) Care Management  10/03/2017  [name] [name] [birth year] [id number]   RN Health Coach Monthly Outreach  Referral Date:08/02/2017 Referral Source:EMMI COPD Red Alert Screening Reason for Referral:Disease Management Education Insurance:[employer]  Outreach Attempt:  Successful outreach to patient for monthly follow up.  HIPAA verified with patient.  Patient stating she is feeling much better.  Cough has resolved.  Denies any increase in shortness of breath and reporting using her rescue inhaler about 2-3 times a week related to the heat.  Patient reporting she is unable to afford her Xarelto co pay at this time and is scheduled to pick up samples from her primary care's office tomorrow morning.  [facility] is assisting patient in obtaining medication assistance for her Xarelto.  Encouraged patient to notify [facility] staff or primary care when she is close to running out of samples.  Continues to monitor blood sugars.  Fasting blood sugar this morning was 120 with fasting ranges of 105-130's.  Appointments:  Patient attended appointment with primary care provider on 08/07/2017 and follow up appointment is due in January 2020.  Patient needs to schedule October follow up appointment with Dr. Delton Coombes.  Plan: RN Health Coach will make next telephone outreach to patient in the month of November.  Rhae Lerner RN [facility] Care Management  RN Health Coach [phone] [name]

## 2017-10-08 DIAGNOSIS — J449 Chronic obstructive pulmonary disease, unspecified: Secondary | ICD-10-CM | POA: Diagnosis not present

## 2017-10-10 ENCOUNTER — Other Ambulatory Visit: Payer: Self-pay | Admitting: Pharmacist

## 2017-10-10 NOTE — Patient Outreach (Signed)
[facility]) Care Management  10/10/2017  [name] [name] [birth year] [id number]   Patient was called to inquire about medication assistance forms. HIPAA identifiers were obtained. Patient confirmed she received the attestation form from [facility] and mailed it back to them.   Plan: [name] will follow up with [employer] and the patient about medication shipment and reorder.  [name], PharmD, BCACP [facility] Clinical Pharmacist [phone]

## 2017-10-12 ENCOUNTER — Ambulatory Visit: Payer: Medicare HMO | Admitting: Pharmacy Technician

## 2017-10-15 ENCOUNTER — Other Ambulatory Visit: Payer: Self-pay | Admitting: Pharmacy Technician

## 2017-10-15 ENCOUNTER — Ambulatory Visit: Payer: Medicare HMO | Admitting: Pharmacy Technician

## 2017-10-15 NOTE — Patient Outreach (Signed)
[facility]) Care Management  10/15/2017  [name] [name] 1945-11-05 [id number]   Follow up call to [employer] to check status of patients application for Xarelto. Percell Belt stated that patient has been denied due to not reaching the 4% OOP spending. Patient will need to spend an additional $645.  Will follow up with patient once I have an update on her application for Stiolto.  [name] [name] Ernesta Amble [facility] Care Management [phone]

## 2017-10-18 ENCOUNTER — Other Ambulatory Visit: Payer: Self-pay | Admitting: Pharmacy Technician

## 2017-10-18 NOTE — Patient Outreach (Signed)
See note 10/11 

## 2017-10-19 NOTE — Patient Outreach (Signed)
[facility]) Care Management  10/19/2017  KRISTIANNA SAPERSTEIN 1945-01-14 [id number]   Follow up call to [employer] patient assistance. Mikle Bosworth confirmed patient has been approved for Proventil HFA as of 10/9 until 01/08/2018.  Successful call to Ms. Falconi, HIPAA identifiers verified. Informed patient that she has been approved for Stiolto and Proventil HFA however she has been denied for Xarelto due to not meeting the OOP spending requirements. Patient stated she would ask her provider for more samples.   Will follow up with patient in 7-10 business days to confirm medication has been received.  [name] Ernesta Amble [facility] Care Management [phone]

## 2017-10-26 ENCOUNTER — Ambulatory Visit: Payer: Medicare HMO | Admitting: Pulmonary Disease

## 2017-10-26 ENCOUNTER — Telehealth: Payer: Self-pay | Admitting: Emergency Medicine

## 2017-10-26 ENCOUNTER — Encounter: Payer: Self-pay | Admitting: Pulmonary Disease

## 2017-10-26 VITALS — BP 120/70 | HR 112 | Temp 97.7°F | Ht 60.0 in | Wt 150.8 lb

## 2017-10-26 DIAGNOSIS — J301 Allergic rhinitis due to pollen: Secondary | ICD-10-CM | POA: Diagnosis not present

## 2017-10-26 DIAGNOSIS — J449 Chronic obstructive pulmonary disease, unspecified: Secondary | ICD-10-CM | POA: Diagnosis not present

## 2017-10-26 DIAGNOSIS — I2601 Septic pulmonary embolism with acute cor pulmonale: Secondary | ICD-10-CM | POA: Diagnosis not present

## 2017-10-26 DIAGNOSIS — K219 Gastro-esophageal reflux disease without esophagitis: Secondary | ICD-10-CM | POA: Diagnosis not present

## 2017-10-26 MED ORDER — TIOTROPIUM BROMIDE-OLODATEROL 2.5-2.5 MCG/ACT IN AERS: 2.0000 | INHALATION_SPRAY | Freq: Every day | RESPIRATORY_TRACT | 0 refills | Status: DC

## 2017-10-26 MED ORDER — PREDNISONE 10 MG PO TABS: ORAL_TABLET | ORAL | 0 refills | Status: DC

## 2017-10-26 MED ORDER — AZITHROMYCIN 250 MG PO TABS: ORAL_TABLET | ORAL | 0 refills | Status: DC

## 2017-10-26 NOTE — Assessment & Plan Note (Addendum)
Hx of PE  Continue Xarelto

## 2017-10-26 NOTE — Progress Notes (Signed)
@Patient  ID: [name] [name], female    DOB: Oct 15, 1945, 72 y.o.   MRN: [id number]  Chief Complaint  Patient presents with  . Acute Visit    SOB, COPD    Referring provider: Shirlean Mylar, MD  HPI:  72 year old with moderate COPD. Upper airway irritation syndrome and chronic cough. Allergic rhinitis. PE in July/2019. On Xarelto.   PMH: CAD, HTN,  Depression  Smoker/ Smoking History: Former smoker. Quit 1996. 60 pack year history.  Maintenance: Stiolto   Pt of: [name] [name]  Recent [facility] Encounters:   ROV 07/02/17 -- Byrym [name] [name] is 52 with a history of moderate COPD and chronic cough with upper airway irritation syndrome.  She has GERD and allergic rhinitis as well. At her last visit we did a trial of changing Spiriva to Stiolto to see if she would benefit. She did like the Stiolto, felt that it helped her more. Then she went to get it filled and it was more expensive even though it is same tier as Spiriva. She is pursuing with her insurance company.      Plan: continue Stiolto, continue oxygen, continue PPI, continue allergy regimen as ordered                                           10/26/2017  - Visit   72 year old female patient presenting today for acute symptoms.  Patient reporting 3 to 4 days of progressive dyspnea.  As well as increased sputum production, milky sputum.  Patient reports that she is been getting progressively more fatigued.  Patient has been adherent to her Stiolto as well as her other medications including her Xarelto.  Patient reports she is in the donut hole is requesting Stiolto samples today.  Patient's been using her 3 L pulsed O2.  When she arrived office today she is 84% on 3 L pulse and placed on 3 L continuous and came to 90%.  Pt denies fever, chills, hemoptypsis, nausea, vomit, diarrhea.   Tests:  07/29/2017-chest x-ray-lingular pneumonia similar to CT 3 days prior 07/26/2017-CT Angio- bilateral lower lobe pulmonary emboli with mild  right heart strain, tree-in-bud opacities in lower lobes 07/27/2017-echocardiogram-LV ejection fraction 60 to 65%, grade 1 diastolic dysfunction, normal PAP pressure  FENO:  No results found for: NITRICOXIDE  PFT: No flowsheet data found.  Imaging: No results found.  Chart Review:    Specialty Problems      Pulmonary Problems   Allergic rhinitis    Qualifier: Diagnosis of  By: [name] RN, [name]        COPD (chronic obstructive pulmonary disease) (HCC)           Cough   Vocal cord dysfunction      Allergies  Allergen Reactions  . Lisinopril     cough  . Sulfonamide Derivatives Rash    Immunization History  Administered Date(s) Administered  . Influenza Whole 12/09/2008, 10/09/2009, 10/02/2010, 09/10/2011  . Influenza, High Dose Seasonal PF 10/07/2016, 10/17/2017  . Influenza,inj,Quad PF,6+ Mos 10/01/2012, 10/08/2013, 09/30/2014, 10/11/2015  . Pneumococcal Conjugate-13 10/09/2013  . Pneumococcal-Unspecified 10/02/2010    Past Medical History:  Diagnosis Date  . Allergic rhinitis, cause unspecified   . CAD in native artery 07/25/2017  . Chronic airway obstruction, not elsewhere classified   . Depressive disorder, not elsewhere classified   . Nephritis and nephropathy, not specified as acute or  chronic, with unspecified pathological lesion in kidney   . Obesity, unspecified   . Other and unspecified hyperlipidemia   . Oxygen deficiency   . Thyrotoxicosis without mention of goiter or other cause, without mention of thyrotoxic crisis or storm   . Unspecified essential hypertension     Tobacco History: Social History   Tobacco Use  Smoking Status Former Smoker  . Packs/day: 2.00  . Years: 30.00  . Pack years: 60.00  . Last attempt to quit: 01/09/1994  . Years since quitting: 23.8  Smokeless Tobacco Never Used   Counseling given: Not Answered  Continue to not smoke  Outpatient Encounter Medications as of 10/26/2017  Medication Sig  . albuterol  (PROAIR HFA) 108 (90 Base) MCG/ACT inhaler INHALE 2 PUFFS INTO THE LUNGS EVERY 6 (SIX) HOURS AS NEEDED FOR WHEEZING OR SHORTNESS OF BREATH.  Marland Kitchen albuterol (PROVENTIL) (2.5 MG/3ML) 0.083% nebulizer solution Take 3 mLs (2.5 mg total) by nebulization every 4 (four) hours as needed for wheezing or shortness of breath (DX: COPD 496).  . Ascorbic Acid (VITAMIN C) 1000 MG tablet Take 1,000 mg by mouth daily.    Marland Kitchen aspirin 81 MG chewable tablet Chew 81 mg by mouth at bedtime.   Marland Kitchen atenolol (TENORMIN) 50 MG tablet Take 50 mg by mouth daily.    Marland Kitchen atorvastatin (LIPITOR) 20 MG tablet Take 20 mg by mouth daily.    . Calcium Carbonate-Vitamin D (CALTRATE 600+D) 600-400 MG-UNIT per tablet Take 2 tablets by mouth daily.    . fexofenadine (ALLEGRA) 180 MG tablet Take 180 mg by mouth daily.  . fluticasone (FLONASE) 50 MCG/ACT nasal spray Place 2 sprays into both nostrils daily. (Patient taking differently: Place 2 sprays into both nostrils daily as needed for allergies or rhinitis. )  . levothyroxine (SYNTHROID, LEVOTHROID) 100 MCG tablet Take 100 mcg by mouth daily.  Marland Kitchen losartan (COZAAR) 25 MG tablet Take 25 mg by mouth daily.  . metFORMIN (GLUCOPHAGE-XR) 500 MG 24 hr tablet Take 1 tablet (500 mg total) by mouth daily. (Patient taking differently: Take 500 mg by mouth 2 (two) times daily. )  . Multiple Vitamins-Minerals (CENTRUM PO) Take 1 tablet by mouth daily.    . naproxen sodium (ALEVE) 220 MG tablet Take 220 mg by mouth daily as needed (pain).  Marland Kitchen omeprazole (PRILOSEC) 20 MG capsule Take 1 capsule (20 mg total) by mouth daily.  . ONE TOUCH ULTRA TEST test strip As directed  . tiotropium (SPIRIVA) 18 MCG inhalation capsule Place 18 mcg into inhaler and inhale daily.    . Tiotropium Bromide-Olodaterol (STIOLTO RESPIMAT) 2.5-2.5 MCG/ACT AERS Inhale 2 puffs into the lungs daily.  . traZODone (DESYREL) 50 MG tablet Take 50 mg by mouth at bedtime.   Marland Kitchen venlafaxine XR (EFFEXOR-XR) 150 MG 24 hr capsule Take 1 capsule by  mouth daily.  [name] [name] 20 MG TABS tablet Take 1 tablet by mouth daily.  Marland Kitchen azithromycin (ZITHROMAX) 250 MG tablet 500mg  (two tablets) today, then 250mg  (1 tablet) for the next 4 days  . nitroGLYCERIN (NITROSTAT) 0.4 MG SL tablet Place 1 tablet (0.4 mg total) under the tongue every 5 (five) minutes as needed for chest pain.  . predniSONE (DELTASONE) 10 MG tablet 4 tabs for 2 days, then 3 tabs for 2 days, 2 tabs for 2 days, then 1 tab for 2 days, then stop  . Tiotropium Bromide-Olodaterol (STIOLTO RESPIMAT) 2.5-2.5 MCG/ACT AERS Inhale 2 puffs into the lungs daily.   No facility-administered encounter medications on file as  of 10/26/2017.      Review of Systems  Review of Systems  Constitutional: Positive for fatigue. Negative for chills, fever and unexpected weight change.  HENT: Positive for congestion and ear pain (muffled hearing ). Negative for postnasal drip, sinus pressure and sinus pain.   Respiratory: Positive for cough, chest tightness and shortness of breath. Negative for wheezing.   Cardiovascular: Negative for chest pain, palpitations and leg swelling.  Gastrointestinal: Negative for blood in stool, diarrhea, nausea and vomiting.  Genitourinary: Negative for dysuria, frequency and urgency.  Musculoskeletal: Negative for arthralgias.  Skin: Negative for color change.  Allergic/Immunologic: Negative for environmental allergies and food allergies.  Neurological: Negative for dizziness, light-headedness and headaches.  Psychiatric/Behavioral: Negative for dysphoric mood. The patient is not nervous/anxious.   All other systems reviewed and are negative.    Physical Exam  BP 120/70 (BP Location: Left Arm, Cuff Size: Normal)   Pulse (!) 112   Temp 97.7 F (36.5 C) (Oral)   Ht 5' (1.524 m)   Wt 150 lb 12.8 oz (68.4 kg)   SpO2 (!) 84% Comment: put on 3L continuous and was 90%  BMI 29.45 kg/m   Wt Readings from Last 5 Encounters:  10/26/17 150 lb 12.8 oz (68.4 kg)  07/26/17  163 lb 9.3 oz (74.2 kg)  07/25/17 160 lb (72.6 kg)  07/02/17 163 lb (73.9 kg)  05/21/17 166 lb (75.3 kg)   3L via [place]   Physical Exam  Constitutional: She is oriented to person, place, and time and well-developed, well-nourished, and in no distress. No distress.  HENT:  Head: Normocephalic and atraumatic.  Right Ear: Hearing and external ear normal.  Left Ear: Hearing and external ear normal.  Nose: Mucosal edema and rhinorrhea present. Right sinus exhibits no maxillary sinus tenderness and no frontal sinus tenderness. Left sinus exhibits no maxillary sinus tenderness and no frontal sinus tenderness.  Mouth/Throat: Uvula is midline and oropharynx is clear and moist. No oropharyngeal exudate.  +Left canal occluded with cerumen unable to visualize TM, +Right canal 75 percent occluded with cerumen TM appears with effusion   Eyes: Pupils are equal, round, and reactive to light.  Neck: Normal range of motion. Neck supple. No JVD present.  Cardiovascular: Normal rate, regular rhythm and normal heart sounds.  Pulmonary/Chest: Effort normal. No accessory muscle usage. No respiratory distress. She has no decreased breath sounds. She has wheezes (exp wheeze). She has no rhonchi.  Abdominal: Soft. Bowel sounds are normal. There is no tenderness.  Musculoskeletal: Normal range of motion. She exhibits no edema.  Lymphadenopathy:    She has no cervical adenopathy.  Neurological: She is alert and oriented to person, place, and time. Gait normal.  Skin: Skin is warm and dry. She is not diaphoretic. No erythema.  Psychiatric: Mood, memory, affect and judgment normal.  Nursing note and vitals reviewed.   Wells Criteria Modified Wells criteria: Clinical assessment for PE Clinical symptoms of DVT (leg swelling, pain with palpation) - 3 Other diagnoses less likely than pulmonary embolism - 3 Heart rate greater than 100 - 1.5 Immobilization (disease greater in 3 days) or surgery in the previous 4 weeks -  1.5 Previous DVT/PE - 1.5 Hemoptysis - 1 Malignancy - 1  Probability Traditional clinical probability assessment (Wells criteria) High - greater than 6 Moderate - 2 to 6 Low less than 2  Simplify clinical probability assessment (modified Wells criteria) PE likely-greater than 4 PE unlikely little less than or equal to 4   Lab  Results:  CBC    Component Value Date/Time   WBC 5.2 07/31/2017 0225   RBC 3.69 (L) 07/31/2017 0225   HGB 10.5 (L) 07/31/2017 0225   HGB 12.5 02/21/2017 1428   HCT 34.7 (L) 07/31/2017 0225   HCT 38.6 02/21/2017 1428   PLT 337 07/31/2017 0225   PLT 252 02/21/2017 1428   MCV 94.0 07/31/2017 0225   MCV 91 02/21/2017 1428   MCH 28.5 07/31/2017 0225   MCHC 30.3 07/31/2017 0225   RDW 13.1 07/31/2017 0225   RDW 13.7 02/21/2017 1428   LYMPHSABS 1.6 02/21/2017 1428   EOSABS 0.4 02/21/2017 1428   BASOSABS 0.0 02/21/2017 1428    BMET    Component Value Date/Time   NA 140 07/30/2017 0227   NA 146 (H) 02/21/2017 1428   K 4.0 07/30/2017 0227   CL 101 07/30/2017 0227   CO2 30 07/30/2017 0227   GLUCOSE 146 (H) 07/30/2017 0227   BUN 8 07/30/2017 0227   BUN 7 (L) 02/21/2017 1428   CREATININE 0.94 07/30/2017 0227   CALCIUM 9.2 07/30/2017 0227   GFRNONAA 59 (L) 07/30/2017 0227   GFRAA >60 07/30/2017 0227    BNP No results found for: BNP  ProBNP No results found for: PROBNP    Assessment & Plan:   Pleasant 72 year old patient seen office visit today.  Will treat patient as COPD exacerbation.  With Z-Pak as well as with prednisone taper.  With patient's history of PE as well as with her on Xarelto I believe is a low suspicion of her currently having a PE at this time.  Modified Wells score is unlikely for PE, Wells score is moderate risk.  Discussed with patient that if symptoms worsen or not improving on these regimens that she needs a follow-up with our office quickly.  Patient agrees.  Patient does not believe that this is how her previous PE felt  which was sudden onset she believes this feels like a COPD exacerbation which I agree based off of her assessment.  Keep follow-up with Dr. Delton Coombes in November/2019.  Continue Stiolto.  Allergic rhinitis  Continue Allegra Continue Flonase Continue nasal saline rinses  Keep follow-up with Dr. Delton Coombes in November/2019   COPD (chronic obstructive pulmonary disease) (HCC) Azithromycin 250mg  tablet  >>>Take 2 tablets (500mg  total) today, and then 1 tablet (250mg ) for the next four days  >>>take with food  >>>can also take probiotic and / or yogurt while on antibiotic   Prednisone 10mg  tablet  >>>4 tabs for 2 days, then 3 tabs for 2 days, 2 tabs for 2 days, then 1 tab for 2 days, then stop >>>take with food  >>>take in the morning   Stiolto Respimat inhaler >>>2 puffs daily >>>Take this no matter what >>>This is not a rescue inhaler  Continue Allegra Continue Flonase Continue nasal saline rinses  Keep follow-up with Dr. Delton Coombes in November/2019  Continue oxygen therapy as prescribed   Pulmonary embolism (HCC) Hx of PE  Continue Xarelto    GERD (gastroesophageal reflux disease) Continue omeprazole     [name], NP 10/26/2017

## 2017-10-26 NOTE — Telephone Encounter (Signed)
Spoke with the pt  She is c/o increased SOB and wheezing for the past 2 days  Appt with Arlys John scheduled for 4:15  Advised to seek emergent care sooner if needed

## 2017-10-26 NOTE — Assessment & Plan Note (Signed)
Continue omeprazole 

## 2017-10-26 NOTE — Patient Instructions (Signed)
Azithromycin 250mg  tablet  >>>Take 2 tablets (500mg  total) today, and then 1 tablet (250mg ) for the next four days  >>>take with food  >>>can also take probiotic and / or yogurt while on antibiotic   Prednisone 10mg  tablet  >>>4 tabs for 2 days, then 3 tabs for 2 days, 2 tabs for 2 days, then 1 tab for 2 days, then stop >>>take with food  >>>take in the morning   Stiolto Respimat inhaler >>>2 puffs daily >>>Take this no matter what >>>This is not a rescue inhaler  Continue Allegra Continue Flonase Continue nasal saline rinses  Keep follow-up with Dr. Delton Coombes in November/2019  Continue Xarelto Continue oxygen therapy as prescribed      November/2019 we will be moving! We will no longer be at our [place] location.  Be on the look out for a post card/mailer to let you know we have officially moved.  Our new address and phone number will be:  [address] [place], [place] [address] Telephone number: [phone]  It is flu season:   >>>Remember to be washing your hands regularly, using hand sanitizer, be careful to use around herself with has contact with people who are sick will increase her chances of getting sick yourself. >>> Best ways to protect herself from the flu: Receive the yearly flu vaccine, practice good hand hygiene washing with soap and also using hand sanitizer when available, eat a nutritious meals, get adequate rest, hydrate appropriately   Please contact the office if your symptoms worsen or you have concerns that you are not improving.   Thank you for choosing [facility] for your healthcare, and for allowing [place] to partner with you on your healthcare journey. I am thankful to be able to provide care to you today.   [name] Headland FNP-C

## 2017-10-26 NOTE — Assessment & Plan Note (Signed)
  Continue Allegra Continue Flonase Continue nasal saline rinses  Keep follow-up with Dr. Delton Coombes in November/2019

## 2017-10-26 NOTE — Assessment & Plan Note (Signed)
Azithromycin 250mg  tablet  >>>Take 2 tablets (500mg  total) today, and then 1 tablet (250mg ) for the next four days  >>>take with food  >>>can also take probiotic and / or yogurt while on antibiotic   Prednisone 10mg  tablet  >>>4 tabs for 2 days, then 3 tabs for 2 days, 2 tabs for 2 days, then 1 tab for 2 days, then stop >>>take with food  >>>take in the morning   Stiolto Respimat inhaler >>>2 puffs daily >>>Take this no matter what >>>This is not a rescue inhaler  Continue Allegra Continue Flonase Continue nasal saline rinses  Keep follow-up with Dr. Delton Coombes in November/2019  Continue oxygen therapy as prescribed

## 2017-11-01 ENCOUNTER — Ambulatory Visit
Admission: RE | Admit: 2017-11-01 | Discharge: 2017-11-01 | Disposition: A | Discharge status: Home / Self Care | Payer: Medicare HMO | Source: Ambulatory Visit | Attending: Family Medicine | Admitting: Family Medicine

## 2017-11-01 DIAGNOSIS — Z1231 Encounter for screening mammogram for malignant neoplasm of breast: Secondary | ICD-10-CM

## 2017-11-08 DIAGNOSIS — J449 Chronic obstructive pulmonary disease, unspecified: Secondary | ICD-10-CM | POA: Diagnosis not present

## 2017-11-16 ENCOUNTER — Encounter: Payer: Self-pay | Admitting: Nurse Practitioner

## 2017-11-16 ENCOUNTER — Ambulatory Visit: Payer: Medicare HMO | Admitting: Nurse Practitioner

## 2017-11-16 VITALS — BP 134/80 | HR 127 | Ht 60.0 in | Wt 149.2 lb

## 2017-11-16 DIAGNOSIS — J441 Chronic obstructive pulmonary disease with (acute) exacerbation: Secondary | ICD-10-CM | POA: Insufficient documentation

## 2017-11-16 MED ORDER — DOXYCYCLINE HYCLATE 100 MG PO TABS: 100.0000 mg | ORAL_TABLET | Freq: Two times a day (BID) | ORAL | 0 refills | Status: DC

## 2017-11-16 MED ORDER — TIOTROPIUM BROMIDE-OLODATEROL 2.5-2.5 MCG/ACT IN AERS: 2.0000 | INHALATION_SPRAY | Freq: Every day | RESPIRATORY_TRACT | 0 refills | Status: DC

## 2017-11-16 MED ORDER — PREDNISONE 10 MG PO TABS: ORAL_TABLET | ORAL | 0 refills | Status: DC

## 2017-11-16 NOTE — Patient Instructions (Addendum)
Will order prednisone taper Will order doxycycline May take mucinex May take delsym for cough Stay well hydrated Continue O2 as needed Continue stiloto Follow up with Dr. Delton Coombes at first available appointment  - in around 1 month

## 2017-11-16 NOTE — Progress Notes (Signed)
@Patient  ID: [name] [name], female    DOB: 03-28-1945, 72 y.o.   MRN: [id number]  Chief Complaint  Patient presents with  . Shortness of Breath    Referring provider: Shirlean Mylar, MD   HPI  72 year old female with COPD, upper airway irritation syndrome, and chronic cough followed by Dr. Delton Coombes Patient is on Xarelto after recent PE in July of this year  Tests: 07/29/2017-chest x-ray-lingular pneumonia similar to CT 3 days prior 07/26/2017-CT Angio- bilateral lower lobe pulmonary emboli with mild right heart strain, tree-in-bud opacities in lower lobes 07/27/2017-echocardiogram-LV ejection fraction 60 to 65%, grade 1 diastolic dysfunction, normal PAP pressure  OV 11/16/17 - shortness of breath Patient presents today with shortness of breath. Symptoms started 3 days ago. She states that she also has a cough that is productive of yellow sputum. Cough has progressively worsened over the past 3 days. She uses oxygen with exertion. She is compliant with Stiolto. She denies fever, chest pain, chills, or edema.       Allergies  Allergen Reactions  . Lisinopril     cough  . Sulfonamide Derivatives Rash    Immunization History  Administered Date(s) Administered  . Influenza Whole 12/09/2008, 10/09/2009, 10/02/2010, 09/10/2011  . Influenza, High Dose Seasonal PF 10/07/2016, 10/15/2017  . Influenza,inj,Quad PF,6+ Mos 10/01/2012, 10/08/2013, 09/30/2014, 10/11/2015  . Pneumococcal Conjugate-13 10/09/2013  . Pneumococcal-Unspecified 10/02/2010    Past Medical History:  Diagnosis Date  . Allergic rhinitis, cause unspecified   . CAD in native artery 07/25/2017  . Chronic airway obstruction, not elsewhere classified   . Depressive disorder, not elsewhere classified   . Nephritis and nephropathy, not specified as acute or chronic, with unspecified pathological lesion in kidney   . Obesity, unspecified   . Other and unspecified hyperlipidemia   . Oxygen deficiency   . Thyrotoxicosis  without mention of goiter or other cause, without mention of thyrotoxic crisis or storm   . Unspecified essential hypertension     Tobacco History: Social History   Tobacco Use  Smoking Status Former Smoker  . Packs/day: 2.00  . Years: 30.00  . Pack years: 60.00  . Last attempt to quit: 01/09/1994  . Years since quitting: 23.8  Smokeless Tobacco Never Used   Counseling given: Yes   Outpatient Encounter Medications as of 11/16/2017  Medication Sig  . albuterol (PROAIR HFA) 108 (90 Base) MCG/ACT inhaler INHALE 2 PUFFS INTO THE LUNGS EVERY 6 (SIX) HOURS AS NEEDED FOR WHEEZING OR SHORTNESS OF BREATH.  Marland Kitchen albuterol (PROVENTIL) (2.5 MG/3ML) 0.083% nebulizer solution Take 3 mLs (2.5 mg total) by nebulization every 4 (four) hours as needed for wheezing or shortness of breath (DX: COPD 496).  . Ascorbic Acid (VITAMIN C) 1000 MG tablet Take 1,000 mg by mouth daily.    Marland Kitchen aspirin 81 MG chewable tablet Chew 81 mg by mouth at bedtime.   Marland Kitchen atenolol (TENORMIN) 50 MG tablet Take 50 mg by mouth daily.    Marland Kitchen atorvastatin (LIPITOR) 20 MG tablet Take 20 mg by mouth daily.    . Calcium Carbonate-Vitamin D (CALTRATE 600+D) 600-400 MG-UNIT per tablet Take 2 tablets by mouth daily.    . fexofenadine (ALLEGRA) 180 MG tablet Take 180 mg by mouth daily.  . fluticasone (FLONASE) 50 MCG/ACT nasal spray Place 2 sprays into both nostrils daily. (Patient taking differently: Place 2 sprays into both nostrils daily as needed for allergies or rhinitis. )  . levothyroxine (SYNTHROID, LEVOTHROID) 100 MCG tablet Take 100 mcg by  mouth daily.  Marland Kitchen losartan (COZAAR) 25 MG tablet Take 25 mg by mouth daily.  . metFORMIN (GLUCOPHAGE-XR) 500 MG 24 hr tablet Take 1 tablet (500 mg total) by mouth daily. (Patient taking differently: Take 500 mg by mouth 2 (two) times daily. )  . Multiple Vitamins-Minerals (CENTRUM PO) Take 1 tablet by mouth daily.    . naproxen sodium (ALEVE) 220 MG tablet Take 220 mg by mouth daily as needed (pain).  Marland Kitchen  omeprazole (PRILOSEC) 20 MG capsule Take 1 capsule (20 mg total) by mouth daily.  . ONE TOUCH ULTRA TEST test strip As directed  . tiotropium (SPIRIVA) 18 MCG inhalation capsule Place 18 mcg into inhaler and inhale daily.    . Tiotropium Bromide-Olodaterol (STIOLTO RESPIMAT) 2.5-2.5 MCG/ACT AERS Inhale 2 puffs into the lungs daily.  . Tiotropium Bromide-Olodaterol (STIOLTO RESPIMAT) 2.5-2.5 MCG/ACT AERS Inhale 2 puffs into the lungs daily.  . traZODone (DESYREL) 50 MG tablet Take 50 mg by mouth at bedtime.   Marland Kitchen venlafaxine XR (EFFEXOR-XR) 150 MG 24 hr capsule Take 1 capsule by mouth daily.  [name] 20 MG TABS tablet Take 1 tablet by mouth daily.  Marland Kitchen doxycycline (VIBRA-TABS) 100 MG tablet Take 1 tablet (100 mg total) by mouth 2 (two) times daily.  . nitroGLYCERIN (NITROSTAT) 0.4 MG SL tablet Place 1 tablet (0.4 mg total) under the tongue every 5 (five) minutes as needed for chest pain.  . predniSONE (DELTASONE) 10 MG tablet Take 4 tabs for 2 days, then 3 tabs for 2 days, then 2 tabs for 2 days, then 1 tab for 2 days, then stop  . Tiotropium Bromide-Olodaterol (STIOLTO RESPIMAT) 2.5-2.5 MCG/ACT AERS Inhale 2 puffs into the lungs daily.  . [DISCONTINUED] azithromycin (ZITHROMAX) 250 MG tablet 500mg  (two tablets) today, then 250mg  (1 tablet) for the next 4 days (Patient not taking: Reported on 11/16/2017)  . [DISCONTINUED] predniSONE (DELTASONE) 10 MG tablet 4 tabs for 2 days, then 3 tabs for 2 days, 2 tabs for 2 days, then 1 tab for 2 days, then stop (Patient not taking: Reported on 11/16/2017)   No facility-administered encounter medications on file as of 11/16/2017.      Review of Systems  Review of Systems  Constitutional: Negative.  Negative for chills and fever.  HENT: Negative.   Respiratory: Positive for cough and shortness of breath.   Cardiovascular: Negative.  Negative for chest pain, palpitations and leg swelling.  Gastrointestinal: Negative.   Allergic/Immunologic: Negative.     Neurological: Negative.   Psychiatric/Behavioral: Negative.        Physical Exam  BP 134/80 (BP Location: Left Arm, Patient Position: Sitting, Cuff Size: Normal)   Pulse (!) 127   Ht 5' (1.524 m)   Wt 149 lb 3.2 oz (67.7 kg)   SpO2 93% Comment: 3L pulse O2  BMI 29.14 kg/m   Wt Readings from Last 5 Encounters:  11/16/17 149 lb 3.2 oz (67.7 kg)  10/26/17 150 lb 12.8 oz (68.4 kg)  07/26/17 163 lb 9.3 oz (74.2 kg)  07/25/17 160 lb (72.6 kg)  07/02/17 163 lb (73.9 kg)     Physical Exam  Constitutional: She is oriented to person, place, and time. She appears well-developed and well-nourished. No distress.  Cardiovascular: Normal rate and regular rhythm.  Pulmonary/Chest: Effort normal and breath sounds normal. She has no decreased breath sounds. She has no wheezes. She has no rhonchi.  Musculoskeletal:       Right lower leg: She exhibits no edema.  Neurological: She  is alert and oriented to person, place, and time.  Psychiatric: She has a normal mood and affect.  Nursing note and vitals reviewed.    Imaging: Mm Digital Screening Bilateral  Result Date: 11/02/2017 CLINICAL DATA:  Screening. EXAM: DIGITAL SCREENING BILATERAL MAMMOGRAM WITH CAD COMPARISON:  Previous exam(s). ACR Breast Density Category b: There are scattered areas of fibroglandular density. FINDINGS: There are no findings suspicious for malignancy. Images were processed with CAD. IMPRESSION: No mammographic evidence of malignancy. A result letter of this screening mammogram will be mailed directly to the patient. RECOMMENDATION: Screening mammogram in one year. (Code:SM-B-01Y) BI-RADS CATEGORY  1: Negative. Electronically Signed   By: Harmon Pier M.D.   On: 11/02/2017 11:45     Assessment & Plan:   COPD with acute exacerbation [facility]) Patient Instructions  Will order prednisone taper Will order doxycycline May take mucinex May take delsym for cough Stay well hydrated Continue O2 as needed Continue  stiloto Follow up with Dr. Delton Coombes at first available appointment  - in around 1 month       Ivonne Andrew, NP 11/16/2017

## 2017-11-16 NOTE — Assessment & Plan Note (Signed)
Patient Instructions  Will order prednisone taper Will order doxycycline May take mucinex May take delsym for cough Stay well hydrated Continue O2 as needed Continue stiloto Follow up with Dr. Delton Coombes at first available appointment  - in around 1 month

## 2017-11-19 ENCOUNTER — Encounter: Payer: Self-pay | Admitting: *Deleted

## 2017-11-19 ENCOUNTER — Other Ambulatory Visit: Payer: Self-pay | Admitting: *Deleted

## 2017-11-19 ENCOUNTER — Other Ambulatory Visit: Payer: Self-pay | Admitting: Pharmacy Technician

## 2017-11-19 NOTE — Patient Outreach (Signed)
[facility]) Care Management  11/19/2017  [name] [name] August 08, 1945 [id number]   Received In basket note from [facility] Coach stating patient has not received Stiolto that she was approved for thru [employer].  Called [employer] to check on status and Kendal Hymen states that patients income was not added to application in order for medication to ship. Once Kendal Hymen put income amount into system patient was denied due to being over income.  Successful outreach call to patient informing her of the issue that company ran into. Informed her that I would mail her an appeal letter to sign and she will also need to mail back her most recent EOB from [facility]. She stated that she would.  [name] [name] CPhT Certified Pharmacy Technician [facility] Care Management Direct [phone]

## 2017-11-19 NOTE — Patient Outreach (Signed)
[facility]) Care Management  [place]  11/19/2017   [name] [name] 05/18/1945 [id number]   [place] Monthly Outreach   Referral Date:  08/02/2017 Referral Source:  EMMI COPD Red Alert Screening Reason for Referral:  Disease Management Education Insurance:  [facility] Medicare   Outreach Attempt:  Successful telephone outreach to patient for quarterly follow up.  HIPAA verified with patient.  Patient reporting having COPD exacerbation now.  Has seen Pulmonologist on this past Friday and is on a steroid taper and antibiotics, which she is currently taking.  Reports she feels some better since starting these medications.  Shortness of breath is less and reports using her rescue inhaler about 3 times over the weekend.  Reports also having a COPD exacerbation in October.  States she thinks her exacerbations are related to the weather changes.  Encouraged patient to keep close follow up with pulmonologist for exacerbations follow up.  Encounter Medications:  Outpatient Encounter Medications as of 11/19/2017  Medication Sig Note  . albuterol (PROAIR HFA) 108 (90 Base) MCG/ACT inhaler INHALE 2 PUFFS INTO THE LUNGS EVERY 6 (SIX) HOURS AS NEEDED FOR WHEEZING OR SHORTNESS OF BREATH.   Marland Kitchen albuterol (PROVENTIL) (2.5 MG/3ML) 0.083% nebulizer solution Take 3 mLs (2.5 mg total) by nebulization every 4 (four) hours as needed for wheezing or shortness of breath (DX: COPD 496).   . Ascorbic Acid (VITAMIN C) 1000 MG tablet Take 1,000 mg by mouth daily.     Marland Kitchen aspirin 81 MG chewable tablet Chew 81 mg by mouth at bedtime.    Marland Kitchen atenolol (TENORMIN) 50 MG tablet Take 50 mg by mouth daily.     Marland Kitchen atorvastatin (LIPITOR) 20 MG tablet Take 20 mg by mouth daily.     . Calcium Carbonate-Vitamin D (CALTRATE 600+D) 600-400 MG-UNIT per tablet Take 2 tablets by mouth daily.     Marland Kitchen doxycycline (VIBRA-TABS) 100 MG tablet Take 1 tablet (100 mg total) by mouth 2 (two) times daily.   .  fexofenadine (ALLEGRA) 180 MG tablet Take 180 mg by mouth daily.   . fluticasone (FLONASE) 50 MCG/ACT nasal spray Place 2 sprays into both nostrils daily. (Patient taking differently: Place 2 sprays into both nostrils daily as needed for allergies or rhinitis. )   . levothyroxine (SYNTHROID, LEVOTHROID) 100 MCG tablet Take 100 mcg by mouth daily.   Marland Kitchen losartan (COZAAR) 25 MG tablet Take 25 mg by mouth daily.   . metFORMIN (GLUCOPHAGE-XR) 500 MG 24 hr tablet Take 1 tablet (500 mg total) by mouth daily. (Patient taking differently: Take 500 mg by mouth 2 (two) times daily. )   . Multiple Vitamins-Minerals (CENTRUM PO) Take 1 tablet by mouth daily.     . naproxen sodium (ALEVE) 220 MG tablet Take 220 mg by mouth daily as needed (pain).   Marland Kitchen omeprazole (PRILOSEC) 20 MG capsule Take 1 capsule (20 mg total) by mouth daily.   . ONE TOUCH ULTRA TEST test strip As directed   . predniSONE (DELTASONE) 10 MG tablet Take 4 tabs for 2 days, then 3 tabs for 2 days, then 2 tabs for 2 days, then 1 tab for 2 days, then stop   . Tiotropium Bromide-Olodaterol (STIOLTO RESPIMAT) 2.5-2.5 MCG/ACT AERS Inhale 2 puffs into the lungs daily. 08/27/2017: Taking samples currently and then will run out  . Tiotropium Bromide-Olodaterol (STIOLTO RESPIMAT) 2.5-2.5 MCG/ACT AERS Inhale 2 puffs into the lungs daily. 33/54/5625: Duplicate   . Tiotropium Bromide-Olodaterol (STIOLTO RESPIMAT) 2.5-2.5 MCG/ACT AERS  Inhale 2 puffs into the lungs daily. 97/98/9211: Duplicate   . traZODone (DESYREL) 50 MG tablet Take 50 mg by mouth at bedtime.    Marland Kitchen venlafaxine XR (EFFEXOR-XR) 150 MG 24 hr capsule Take 1 capsule by mouth daily.   Alveda Reasons 20 MG TABS tablet Take 1 tablet by mouth daily.   . nitroGLYCERIN (NITROSTAT) 0.4 MG SL tablet Place 1 tablet (0.4 mg total) under the tongue every 5 (five) minutes as needed for chest pain.   Marland Kitchen tiotropium (SPIRIVA) 18 MCG inhalation capsule Place 18 mcg into inhaler and inhale daily.   08/27/2017: Reports will  start back taking once out of samples of Stiolto Respimat   No facility-administered encounter medications on file as of 11/19/2017.     Functional Status:  In your present state of health, do you have any difficulty performing the following activities: 08/27/2017 07/26/2017  Hearing? Y Y  Comment bilateral hearing aides -  Vision? Y N  Comment reading glasses -  Difficulty concentrating or making decisions? N N  Walking or climbing stairs? N N  Dressing or bathing? N N  Doing errands, shopping? N N  Preparing Food and eating ? N -  Using the Toilet? N -  In the past six months, have you accidently leaked urine? N -  Do you have problems with loss of bowel control? N -  Managing your Medications? N -  Managing your Finances? N -  Housekeeping or managing your Housekeeping? N -  Some recent data might be hidden    Fall/Depression Screening: Fall Risk  11/19/2017 08/27/2017  Falls in the past year? 1 Yes  Comment no falls in the past 2 months -  Number falls in past yr: 1 1  Injury with Fall? 0 No  Risk for fall due to : Impaired balance/gait;Medication side effect;History of fall(s);Impaired mobility;Impaired vision Impaired balance/gait;History of fall(s);Medication side effect  Follow up Falls prevention discussed;Education provided Education provided;Falls prevention discussed   PHQ 2/9 Scores 08/27/2017  PHQ - 2 Score 0    [facility] CM Care Plan Problem One     Most Recent Value  Care Plan Problem One  Knowledge deficiet related to self care management of COPD. and diabetes  Role Documenting the Problem One  Health Cache for Problem One  Active  [facility] Long Term Goal   Patient will report no hospitalizations or emergency room visits in the next 90 days.  [facility] Long Term Goal Start Date  11/19/17  [facility] Long Term Goal Met Date  11/19/17  Interventions for Problem One Long Term Goal  Current care plan and goals reviewed and discussed with patient, encoruaged to keep and  attend scheduled medical appointments, reviewed medications and indications and encuarged medication compliance, encouraged patient to continue to get Xarelto samples from MD office when can not afford co pay, encouraged patient to discuss medication assistance with [facility] Pharmacist for [place], congratulated patient on sticking with her COPD action plan of contact physician when COPD symptoms arise  [facility] CM Short Term Goal #1   Patient will report resolution of COPD exacerbation in the next 30 days.  [facility] CM Short Term Goal #1 Start Date  11/19/17  [facility] CM Short Term Goal #1 Met Date  11/19/17  Interventions for Short Term Goal #1  Verified patient has prescription for prednisone taper and antibiotics and patient is currently taking medication as prescribed, encouraged patient to contact pulmonologist if exacerbation/shortness of breath is not resolved, discussed minimizing  COPD trigger  [facility] CM Short Term Goal #2   Patient will report a decrease in Hgb A1C by 0.2 points in the next 90 days.  [facility] CM Short Term Goal #2 Start Date  11/19/17  Interventions for Short Term Goal #2  Discussed current Hgb A1C and current glucose ranges, encouraged to continue to monitor blood sugars, discussed healthier food and drink options, congratulated patient on current weight loss and increase in activity     Appointments:  Reports last seeing primary care provider, Dr. Justin Mend on 08/07/2017 and having follow up scheduled for January 2020.  Does state she plans to schedule appointment sooner to get Xarelto samples from office.  Patient last saw Pulmonologist on 11/16/2017 and has scheduled follow up on 12/27/2017.  Plan: RN Health Coach will make next telephone outreach to patient in the month of February. RN Health Coach will send primary care provider Quarterly Update.  [place] [phone] [email or web address]

## 2017-11-28 DIAGNOSIS — H6123 Impacted cerumen, bilateral: Secondary | ICD-10-CM | POA: Diagnosis not present

## 2017-12-08 DIAGNOSIS — J449 Chronic obstructive pulmonary disease, unspecified: Secondary | ICD-10-CM | POA: Diagnosis not present

## 2017-12-11 ENCOUNTER — Other Ambulatory Visit: Payer: Self-pay | Admitting: Pharmacy Technician

## 2017-12-11 NOTE — Patient Outreach (Signed)
[facility]) Care Management  12/11/2017  Festus Barrenatricia A Bebo 01/13/1945 [id number]    Unsuccessful call placed to patient regarding patient assistance appeal letter for Stiolto, unable to leave voicemail.  Will make 2nd call attempt in 2-3 business days if call has not been returned.  Suzan SlickAshley N. Effie Shyoleman, CPhT Certified Pharmacy Technician [facility] Care Management Direct Dial:[phone]

## 2017-12-12 ENCOUNTER — Telehealth: Payer: Self-pay | Admitting: Pharmacist

## 2017-12-12 ENCOUNTER — Telehealth: Payer: Self-pay | Admitting: Nurse Practitioner

## 2017-12-12 NOTE — Telephone Encounter (Signed)
Called and spoke with patient, patient states she is still SOB, coughing, and chest tightness. She states [place] told her she would renew her prescription if she still was not feeling better. She has completed the antibiotic and prednisone taper.   [place] please advise.

## 2017-12-12 NOTE — Patient Outreach (Signed)
[facility]) Care Management  12/12/2017  [name] 02/24/1945 [id number]  Patient was called regarding medication assistance. HIPAA identifiers were obtained.  Patient confirmed she received the appeal letter but did not send it back because she thought she was denied. Patient was encouraged to send the appeal letter back so that we can send it to [employer] on her behalf.  Spoke with [facility] Certified Pharmacy Technician, Lilla ShookAshley Coleman who said she would also look into 2020 approval for the patient.   Beecher McardleKatina J. Zafirah Vanzee, PharmD, BCACP [facility] Clinical Pharmacist [phone]

## 2017-12-13 NOTE — Telephone Encounter (Signed)
Pt is calling back  [phone] she said that she is going to her PCP tomorrow she is going to ask them about the medication

## 2017-12-13 NOTE — Telephone Encounter (Signed)
Attempted to call pt but unable to reach her. Left message for pt to return call.  When pt returns call, please schedule an acute visit for pt with APP. Thanks!

## 2017-12-13 NOTE — Telephone Encounter (Signed)
Due to Archie Pattenonya being out of the office, routing to APP of the day so they can address this for pt.  Beth,  Please advise on this for pt. Thanks!

## 2017-12-13 NOTE — Telephone Encounter (Signed)
I think she should be seen, it's bee a month since her OV with Tonya. If she cant make it I can send in prednisone course but she needs to follow up with Dr. Delton CoombesByrum this month

## 2017-12-14 DIAGNOSIS — I10 Essential (primary) hypertension: Secondary | ICD-10-CM | POA: Diagnosis not present

## 2017-12-14 DIAGNOSIS — E559 Vitamin D deficiency, unspecified: Secondary | ICD-10-CM | POA: Diagnosis not present

## 2017-12-14 DIAGNOSIS — E1121 Type 2 diabetes mellitus with diabetic nephropathy: Secondary | ICD-10-CM | POA: Diagnosis not present

## 2017-12-14 DIAGNOSIS — E538 Deficiency of other specified B group vitamins: Secondary | ICD-10-CM | POA: Diagnosis not present

## 2017-12-14 DIAGNOSIS — R5382 Chronic fatigue, unspecified: Secondary | ICD-10-CM | POA: Diagnosis not present

## 2017-12-14 DIAGNOSIS — E039 Hypothyroidism, unspecified: Secondary | ICD-10-CM | POA: Diagnosis not present

## 2017-12-18 DIAGNOSIS — E538 Deficiency of other specified B group vitamins: Secondary | ICD-10-CM | POA: Diagnosis not present

## 2017-12-24 ENCOUNTER — Other Ambulatory Visit: Payer: Self-pay | Admitting: Pharmacy Technician

## 2017-12-24 NOTE — Patient Outreach (Addendum)
[facility]) Care Management  12/24/2017  [name] [name] 12/22/1945 [id number]    Unsuccessful call #1 placed to patient regarding patient assistance application for 2020 , HIPAA compliant voicemail left.   Will follow up with patient in 2-3 business days if call has not been returned.   Suzan SlickAshley N. Effie Shyoleman, CPhT Certified Pharmacy Technician [facility] Care Management Direct Dial:[phone]   ADDENDUM 4:27pm  Incoming call from patient, HIPAA identifiers verified. Informed patient about reapplying for [employer] and [employer] (with appeal letter) and patients agrees to re-applying for 2020. Prepared applications to be mailed.   Will follow up with patient in 10-14 business days (due to holidays) to confirm applications have been received.  Suzan SlickAshley N. Effie Shyoleman, CPhT Certified Pharmacy Technician [facility] Care Management Direct Dial:[phone]

## 2017-12-25 DIAGNOSIS — E538 Deficiency of other specified B group vitamins: Secondary | ICD-10-CM | POA: Diagnosis not present

## 2017-12-26 ENCOUNTER — Ambulatory Visit: Payer: Self-pay | Admitting: Pharmacy Technician

## 2017-12-27 ENCOUNTER — Ambulatory Visit: Payer: Medicare HMO | Admitting: Emergency Medicine

## 2017-12-27 ENCOUNTER — Encounter: Payer: Self-pay | Admitting: Emergency Medicine

## 2017-12-27 VITALS — BP 130/60 | HR 108 | Ht 60.0 in | Wt 149.0 lb

## 2017-12-27 DIAGNOSIS — J449 Chronic obstructive pulmonary disease, unspecified: Secondary | ICD-10-CM

## 2017-12-27 MED ORDER — TIOTROPIUM BROMIDE-OLODATEROL 2.5-2.5 MCG/ACT IN AERS: 2.0000 | INHALATION_SPRAY | Freq: Every day | RESPIRATORY_TRACT | 0 refills | Status: AC

## 2017-12-27 MED ORDER — FLUTICASONE PROPIONATE 50 MCG/ACT NA SUSP: 2.0000 | Freq: Every day | NASAL | 5 refills | Status: DC

## 2017-12-27 MED ORDER — OMEPRAZOLE 20 MG PO CPDR: 20.0000 mg | DELAYED_RELEASE_CAPSULE | Freq: Two times a day (BID) | ORAL | 5 refills | Status: DC

## 2017-12-27 NOTE — Progress Notes (Signed)
  Subjective:   Patient ID: Festus BarrenPatricia A Worthley, female    DOB: 11/07/1945, 72 y.o.   MRN: [id number]  HPI [name] is a 72 year old with moderate COPD. Upper airway irritation syndrome and chronic cough. Allergic rhinitis.      ROV 12/27/17 --72 year old woman with moderate COPD, upper airway irritation syndrome with chronic cough that is exacerbated by allergic rhinitis and GERD.  She has frequent flares and has been seen twice in our office since our last visit, treated with antibiotics and then antibiotics and steroids.  She was treated with another course of steroids by her PCP beginning of this month, symptoms included cough and dyspnea.  New dx of B12 deficiency, had some recent falls in setting dizziness.   She tells me that her breathing is better, has some hoarse voice, cough has resolve. She hasn't been using flonase reliably, allegra, needs a new script. She is on omeprazole, now bid. She prefers stiolto to spiriva, wants to continue it. Uses albuterol rarely.                                                 Objective:   Vitals:   12/27/17 1101  BP: 130/60  Pulse: (!) 108  SpO2: 90%  Weight: 149 lb (67.6 kg)  Height: 5' (1.524 m)    Gen: Pleasant, obese, in no distress,  normal affect  ENT: No lesions,  mouth clear,  oropharynx clear, no postnasal drip, mild hoarseness  Neck: No JVD, no UA noise today  Lungs: No use of accessory muscles, distant, no wheeze  Cardiovascular: RRR, heart sounds normal, no murmur or gallops, no peripheral edema  Musculoskeletal: No deformities, no cyanosis or clubbing  Neuro: alert, non focal  Skin: Warm, no lesions or rashes   Assessment & Plan:  COPD (chronic obstructive pulmonary disease) (HCC) Please continue Stiolto 2 puffs once daily. Keep your albuterol available to use 2 puffs if needed for shortness of breath, chest tightness, wheezing. Follow with Dr Delton CoombesByrum in 3 months or sooner if you have any problems.  Allergic  rhinitis Please continue fluticasone nasal spray, 2 sprays each nostril once daily. Continue Allegra once daily.  GERD (gastroesophageal reflux disease) Continue omeprazole 20 mg twice a day.  We will write a new prescription for this so that you can continue to take it twice a day.    Levy Pupaobert Nehal Witting, MD, PhD 12/27/2017, 11:29 AM [facility] [phone] or if no answer [phone]

## 2017-12-27 NOTE — Assessment & Plan Note (Signed)
Please continue Stiolto 2 puffs once daily. Keep your albuterol available to use 2 puffs if needed for shortness of breath, chest tightness, wheezing. Follow with Dr Delton CoombesByrum in 3 months or sooner if you have any problems.

## 2017-12-27 NOTE — Assessment & Plan Note (Signed)
Please continue fluticasone nasal spray, 2 sprays each nostril once daily. Continue Allegra once daily.

## 2017-12-27 NOTE — Patient Instructions (Signed)
Please continue Stiolto 2 puffs once daily. Keep your albuterol available to use 2 puffs if needed for shortness of breath, chest tightness, wheezing. Continue omeprazole 20 mg twice a day.  We will write a new prescription for this so that you can continue to take it twice a day. Please continue fluticasone nasal spray, 2 sprays each nostril once daily. Continue Allegra once daily. Follow with Dr Delton CoombesByrum in 3 months or sooner if you have any problems.

## 2017-12-27 NOTE — Assessment & Plan Note (Signed)
Continue omeprazole 20 mg twice a day.  We will write a new prescription for this so that you can continue to take it twice a day.

## 2017-12-31 ENCOUNTER — Encounter: Payer: Self-pay | Admitting: Hematology and Oncology

## 2017-12-31 ENCOUNTER — Telehealth: Payer: Self-pay | Admitting: Hematology and Oncology

## 2017-12-31 NOTE — Telephone Encounter (Signed)
New referral received from Dr. Shirlean Mylararol Webb for severe anemia. Pt has been cld and scheduled to see Dr. Pamelia HoitGudena on 1/6 at 345pm. I offered the pt an appt on 12/26, w/Dr. Melton AlarHiggs,  but she wasn't feeling well. Letter mailed.

## 2018-01-01 ENCOUNTER — Emergency Department (HOSPITAL_COMMUNITY): Payer: Medicare HMO

## 2018-01-01 ENCOUNTER — Other Ambulatory Visit: Payer: Self-pay

## 2018-01-01 ENCOUNTER — Encounter (HOSPITAL_COMMUNITY): Payer: Self-pay | Admitting: Emergency Medicine

## 2018-01-01 ENCOUNTER — Emergency Department (HOSPITAL_COMMUNITY)
Admission: EM | Admit: 2018-01-01 | Discharge: 2018-01-01 | Disposition: A | Discharge status: Home / Self Care | Payer: Medicare HMO | Attending: Emergency Medicine | Admitting: Emergency Medicine

## 2018-01-01 DIAGNOSIS — I959 Hypotension, unspecified: Secondary | ICD-10-CM | POA: Insufficient documentation

## 2018-01-01 DIAGNOSIS — Z79890 Hormone replacement therapy (postmenopausal): Secondary | ICD-10-CM | POA: Insufficient documentation

## 2018-01-01 DIAGNOSIS — I10 Essential (primary) hypertension: Secondary | ICD-10-CM | POA: Diagnosis not present

## 2018-01-01 DIAGNOSIS — J449 Chronic obstructive pulmonary disease, unspecified: Secondary | ICD-10-CM | POA: Diagnosis not present

## 2018-01-01 DIAGNOSIS — I2510 Atherosclerotic heart disease of native coronary artery without angina pectoris: Secondary | ICD-10-CM | POA: Insufficient documentation

## 2018-01-01 DIAGNOSIS — E538 Deficiency of other specified B group vitamins: Secondary | ICD-10-CM | POA: Diagnosis not present

## 2018-01-01 DIAGNOSIS — N3001 Acute cystitis with hematuria: Secondary | ICD-10-CM | POA: Diagnosis not present

## 2018-01-01 DIAGNOSIS — N390 Urinary tract infection, site not specified: Secondary | ICD-10-CM | POA: Diagnosis not present

## 2018-01-01 DIAGNOSIS — Z7982 Long term (current) use of aspirin: Secondary | ICD-10-CM | POA: Diagnosis not present

## 2018-01-01 DIAGNOSIS — I951 Orthostatic hypotension: Secondary | ICD-10-CM

## 2018-01-01 DIAGNOSIS — Z7901 Long term (current) use of anticoagulants: Secondary | ICD-10-CM | POA: Diagnosis not present

## 2018-01-01 DIAGNOSIS — Z888 Allergy status to other drugs, medicaments and biological substances status: Secondary | ICD-10-CM | POA: Insufficient documentation

## 2018-01-01 DIAGNOSIS — Z87891 Personal history of nicotine dependence: Secondary | ICD-10-CM | POA: Diagnosis not present

## 2018-01-01 DIAGNOSIS — E119 Type 2 diabetes mellitus without complications: Secondary | ICD-10-CM | POA: Diagnosis not present

## 2018-01-01 DIAGNOSIS — R1110 Vomiting, unspecified: Secondary | ICD-10-CM | POA: Diagnosis not present

## 2018-01-01 DIAGNOSIS — K219 Gastro-esophageal reflux disease without esophagitis: Secondary | ICD-10-CM | POA: Insufficient documentation

## 2018-01-01 DIAGNOSIS — R42 Dizziness and giddiness: Secondary | ICD-10-CM | POA: Diagnosis not present

## 2018-01-01 DIAGNOSIS — E785 Hyperlipidemia, unspecified: Secondary | ICD-10-CM | POA: Insufficient documentation

## 2018-01-01 DIAGNOSIS — Z882 Allergy status to sulfonamides status: Secondary | ICD-10-CM | POA: Insufficient documentation

## 2018-01-01 DIAGNOSIS — Z7984 Long term (current) use of oral hypoglycemic drugs: Secondary | ICD-10-CM | POA: Diagnosis not present

## 2018-01-01 DIAGNOSIS — Z79899 Other long term (current) drug therapy: Secondary | ICD-10-CM | POA: Insufficient documentation

## 2018-01-01 DIAGNOSIS — R0902 Hypoxemia: Secondary | ICD-10-CM | POA: Diagnosis not present

## 2018-01-01 DIAGNOSIS — Z7951 Long term (current) use of inhaled steroids: Secondary | ICD-10-CM | POA: Diagnosis not present

## 2018-01-01 DIAGNOSIS — D649 Anemia, unspecified: Secondary | ICD-10-CM | POA: Insufficient documentation

## 2018-01-01 DIAGNOSIS — Z86711 Personal history of pulmonary embolism: Secondary | ICD-10-CM | POA: Diagnosis not present

## 2018-01-01 DIAGNOSIS — R531 Weakness: Secondary | ICD-10-CM | POA: Diagnosis not present

## 2018-01-01 DIAGNOSIS — D509 Iron deficiency anemia, unspecified: Secondary | ICD-10-CM | POA: Diagnosis not present

## 2018-01-01 DIAGNOSIS — R918 Other nonspecific abnormal finding of lung field: Secondary | ICD-10-CM | POA: Diagnosis not present

## 2018-01-01 DIAGNOSIS — N3000 Acute cystitis without hematuria: Secondary | ICD-10-CM

## 2018-01-01 DIAGNOSIS — R112 Nausea with vomiting, unspecified: Secondary | ICD-10-CM | POA: Diagnosis not present

## 2018-01-01 LAB — URINALYSIS, ROUTINE W REFLEX MICROSCOPIC
Bilirubin Urine: NEGATIVE
Glucose, UA: NEGATIVE mg/dL
Hgb urine dipstick: NEGATIVE
Ketones, ur: NEGATIVE mg/dL
Nitrite: NEGATIVE
Protein, ur: 30 mg/dL — AB
Specific Gravity, Urine: 1.019 (ref 1.005–1.030)
pH: 5 (ref 5.0–8.0)

## 2018-01-01 LAB — RETICULOCYTES
Immature Retic Fract: 40.6 % — ABNORMAL HIGH (ref 2.3–15.9)
RBC.: 3.67 MIL/uL — ABNORMAL LOW (ref 3.87–5.11)
Retic Count, Absolute: 73.8 10*3/uL (ref 19.0–186.0)
Retic Ct Pct: 2 % (ref 0.4–3.1)

## 2018-01-01 LAB — I-STAT TROPONIN, ED: Troponin i, poc: 0 ng/mL (ref 0.00–0.08)

## 2018-01-01 LAB — COMPREHENSIVE METABOLIC PANEL
ALT: 11 U/L (ref 0–44)
AST: 17 U/L (ref 15–41)
Albumin: 3.1 g/dL — ABNORMAL LOW (ref 3.5–5.0)
Alkaline Phosphatase: 49 U/L (ref 38–126)
Anion gap: 9 (ref 5–15)
BUN: 22 mg/dL (ref 8–23)
CO2: 23 mmol/L (ref 22–32)
Calcium: 8.7 mg/dL — ABNORMAL LOW (ref 8.9–10.3)
Chloride: 106 mmol/L (ref 98–111)
Creatinine, Ser: 1.27 mg/dL — ABNORMAL HIGH (ref 0.44–1.00)
GFR calc Af Amer: 48 mL/min — ABNORMAL LOW (ref >60)
GFR calc non Af Amer: 42 mL/min — ABNORMAL LOW (ref >60)
Glucose, Bld: 112 mg/dL — ABNORMAL HIGH (ref 70–99)
Potassium: 4.1 mmol/L (ref 3.5–5.1)
Sodium: 138 mmol/L (ref 135–145)
Total Bilirubin: 0.6 mg/dL (ref 0.3–1.2)
Total Protein: 5.9 g/dL — ABNORMAL LOW (ref 6.5–8.1)

## 2018-01-01 LAB — TYPE AND SCREEN
ABO/RH(D): A POS
Antibody Screen: NEGATIVE

## 2018-01-01 LAB — CBC WITH DIFFERENTIAL/PLATELET
Abs Immature Granulocytes: 0.06 10*3/uL (ref 0.00–0.07)
Basophils Absolute: 0 10*3/uL (ref 0.0–0.1)
Basophils Relative: 0 %
Eosinophils Absolute: 0.4 10*3/uL (ref 0.0–0.5)
Eosinophils Relative: 4 %
HCT: 28.4 % — ABNORMAL LOW (ref 36.0–46.0)
Hemoglobin: 7.6 g/dL — ABNORMAL LOW (ref 12.0–15.0)
Immature Granulocytes: 1 %
Lymphocytes Relative: 15 %
Lymphs Abs: 1.4 10*3/uL (ref 0.7–4.0)
MCH: 20.7 pg — ABNORMAL LOW (ref 26.0–34.0)
MCHC: 26.8 g/dL — ABNORMAL LOW (ref 30.0–36.0)
MCV: 77.4 fL — ABNORMAL LOW (ref 80.0–100.0)
Monocytes Absolute: 1 10*3/uL (ref 0.1–1.0)
Monocytes Relative: 11 %
Neutro Abs: 6.7 10*3/uL (ref 1.7–7.7)
Neutrophils Relative %: 69 %
Platelets: 474 10*3/uL — ABNORMAL HIGH (ref 150–400)
RBC: 3.67 MIL/uL — ABNORMAL LOW (ref 3.87–5.11)
RDW: 16.6 % — ABNORMAL HIGH (ref 11.5–15.5)
WBC: 9.6 10*3/uL (ref 4.0–10.5)
nRBC: 0 % (ref 0.0–0.2)

## 2018-01-01 LAB — IRON AND TIBC
Iron: 26 ug/dL — ABNORMAL LOW (ref 28–170)
Saturation Ratios: 6 % — ABNORMAL LOW (ref 10.4–31.8)
TIBC: 442 ug/dL (ref 250–450)
UIBC: 416 ug/dL

## 2018-01-01 LAB — VITAMIN B12: Vitamin B-12: >7500 pg/mL — ABNORMAL HIGH (ref 180–914)

## 2018-01-01 LAB — FERRITIN: Ferritin: 18 ng/mL (ref 11–307)

## 2018-01-01 LAB — ABO/RH: ABO/RH(D): A POS

## 2018-01-01 LAB — FOLATE: Folate: 59.7 ng/mL (ref >5.9)

## 2018-01-01 LAB — POC OCCULT BLOOD, ED: Fecal Occult Bld: NEGATIVE

## 2018-01-01 MED ORDER — SODIUM CHLORIDE 0.9 % IV BOLUS: 1000.0000 mL | Freq: Once | INTRAVENOUS | Status: DC

## 2018-01-01 MED ORDER — SODIUM CHLORIDE 0.9 % IV SOLN
1.0000 g | Freq: Once | INTRAVENOUS | Status: AC
  Administered 2018-01-01: 1 g via INTRAVENOUS
  Filled 2018-01-01: qty 10

## 2018-01-01 MED ORDER — SODIUM CHLORIDE 0.9 % IV BOLUS
1000.0000 mL | Freq: Once | INTRAVENOUS | Status: AC
  Administered 2018-01-01: 1000 mL via INTRAVENOUS

## 2018-01-01 MED ORDER — IOHEXOL 300 MG/ML  SOLN
100.0000 mL | Freq: Once | INTRAMUSCULAR | Status: AC | PRN
  Administered 2018-01-01: 100 mL via INTRAVENOUS

## 2018-01-01 MED ORDER — FERROUS SULFATE 325 (65 FE) MG PO TABS: 325.0000 mg | ORAL_TABLET | Freq: Every day | ORAL | 0 refills | Status: DC

## 2018-01-01 MED ORDER — CEPHALEXIN 500 MG PO CAPS: 500.0000 mg | ORAL_CAPSULE | Freq: Three times a day (TID) | ORAL | 0 refills | Status: DC

## 2018-01-01 NOTE — ED Notes (Signed)
Pt ambulated to the bathroom.  

## 2018-01-01 NOTE — ED Triage Notes (Signed)
Arrived via EMS from doctor's office. Patient drove herself to receive a vitamin B12 injection and office found patient to have BP 70/40 and pale. Alert answering and following commands appropriate. EMS administered 0.9 NS 500 ML and repeat BP 90/48. Patient has a history of anemia and had a hematology appointment 01/14/18.

## 2018-01-01 NOTE — Discharge Instructions (Signed)
You have urinary tract infection. Take keflex three times daily for a week   Stay hydrated.   Your iron level is low that can cause your blood count to be low. Take iron pills   Hold your blood thinner for 3 days.   See your hematologist as scheduled and your doctor next week   Return to ER if you have worse dizziness, passing out, abdominal pain

## 2018-01-01 NOTE — ED Notes (Signed)
Returned from CT.

## 2018-01-01 NOTE — ED Notes (Signed)
Transported to radiology 

## 2018-01-01 NOTE — ED Provider Notes (Signed)
[facility] Provider [facility]   CSN: [id number] Arrival date & time: 01/01/18  1134     History   Chief Complaint Chief Complaint  Patient presents with  . Hypotension    HPI [name] [name] is a 72 y.o. female hx of CAD, PE on Xarelto, here presenting with hypotension, weakness, anemia.  Patient states that she has known anemia likely from B12 deficiency.  She states that her hemoglobin several days ago was 7.7.  She went to the office for her B12 shot.  She states that she feels dizzy like she is going to pass out.  Also has generalized weakness as well.  She denies any blood in her stool but does take Xarelto.  She denies vomiting or fevers.  She has no known aortic aneurysm.  She is supposed to follow-up with hematology in 2 weeks for her anemia.   The history is provided by the patient.    Past Medical History:  Diagnosis Date  . Allergic rhinitis, cause unspecified   . CAD in native artery 07/25/2017  . Chronic airway obstruction, not elsewhere classified   . Depressive disorder, not elsewhere classified   . Nephritis and nephropathy, not specified as acute or chronic, with unspecified pathological lesion in kidney   . Obesity, unspecified   . Other and unspecified hyperlipidemia   . Oxygen deficiency   . Thyrotoxicosis without mention of goiter or other cause, without mention of thyrotoxic crisis or storm   . Unspecified essential hypertension     Patient Active Problem List   Diagnosis Date Noted  . Pulmonary embolism (HCC) 07/26/2017  . CAD in native artery 07/25/2017  . Abnormal cardiac CT angiography 02/21/2017  . Chest pain with high risk of acute coronary syndrome 02/21/2017  . Non-insulin treated type 2 diabetes mellitus (HCC) 02/21/2017  . GERD (gastroesophageal reflux disease) 12/05/2016  . Vocal cord dysfunction 11/06/2014  . Cough 06/04/2013  . Thyrotoxicosis 10/31/2006  . Dyslipidemia 10/31/2006  . OBESITY  10/31/2006  . DEPRESSION 10/31/2006  . Essential hypertension 10/31/2006  . Allergic rhinitis 10/31/2006  . COPD (chronic obstructive pulmonary disease) (HCC) 10/31/2006  . GLOMERULONEPHRITIS 10/31/2006    Past Surgical History:  Procedure Laterality Date  . CHOLECYSTECTOMY    . LEFT HEART CATH AND CORONARY ANGIOGRAPHY N/A 03/01/2017   Procedure: LEFT HEART CATH AND CORONARY ANGIOGRAPHY;  Surgeon: Yvonne KendallEnd, Christopher, MD;  Location: [facility] INVASIVE CV LAB;  Service: Cardiovascular;  Laterality: N/A;     OB History   No obstetric history on file.      Home Medications    Prior to Admission medications   Medication Sig Start Date End Date Taking? Authorizing Provider  albuterol (PROAIR HFA) 108 (90 Base) MCG/ACT inhaler INHALE 2 PUFFS INTO THE LUNGS EVERY 6 (SIX) HOURS AS NEEDED FOR WHEEZING OR SHORTNESS OF BREATH. Patient taking differently: Inhale 2 puffs into the lungs every 6 (six) hours as needed for wheezing or shortness of breath.  05/21/17  Yes Leslye PeerByrum, Robert S, MD  albuterol (PROVENTIL) (2.5 MG/3ML) 0.083% nebulizer solution Take 3 mLs (2.5 mg total) by nebulization every 4 (four) hours as needed for wheezing or shortness of breath (DX: COPD 496). 06/04/13  Yes Leslye PeerByrum, Robert S, MD  Ascorbic Acid (VITAMIN C) 1000 MG tablet Take 1,000 mg by mouth daily.     Yes [provider]  aspirin 81 MG chewable tablet Chew 81 mg by mouth at bedtime.    Yes [provider]  atenolol (  TENORMIN) 50 MG tablet Take 50 mg by mouth daily.     Yes [provider]  atorvastatin (LIPITOR) 20 MG tablet Take 20 mg by mouth daily.     Yes [provider]  Calcium Carbonate-Vitamin D (CALTRATE 600+D) 600-400 MG-UNIT per tablet Take 2 tablets by mouth daily.     Yes [provider]  fexofenadine (ALLEGRA) 180 MG tablet Take 180 mg by mouth daily.   Yes [provider]  fluticasone (FLONASE) 50 MCG/ACT nasal spray Place 2 sprays into both nostrils daily.  12/27/17  Yes Leslye PeerByrum, Robert S, MD  levothyroxine (SYNTHROID, LEVOTHROID) 100 MCG tablet Take 100 mcg by mouth daily.   Yes [provider]  losartan (COZAAR) 25 MG tablet Take 25 mg by mouth daily.   Yes [provider]  metFORMIN (GLUCOPHAGE) 500 MG tablet Take 500 mg by mouth 2 (two) times daily with a meal.    Yes [provider]  Multiple Vitamins-Minerals (CENTRUM PO) Take 1 tablet by mouth daily.     Yes [provider]  naproxen sodium (ALEVE) 220 MG tablet Take 220 mg by mouth daily as needed (pain).   Yes [provider]  nitroGLYCERIN (NITROSTAT) 0.4 MG SL tablet Place 1 tablet (0.4 mg total) under the tongue every 5 (five) minutes as needed for chest pain. 02/21/17 01/01/18 Yes Kilroy, Eda PaschalLuke K, PA-C  omeprazole (PRILOSEC) 20 MG capsule Take 1 capsule (20 mg total) by mouth 2 (two) times daily before a meal. 12/27/17  Yes Byrum, Les Pouobert S, MD  Tiotropium Bromide-Olodaterol (STIOLTO RESPIMAT) 2.5-2.5 MCG/ACT AERS Inhale 2 puffs into the lungs daily for 28 days. 12/27/17 01/24/18 Yes Leslye PeerByrum, Robert S, MD  traZODone (DESYREL) 50 MG tablet Take 50 mg by mouth at bedtime.  02/28/13  Yes [provider]  venlafaxine XR (EFFEXOR-XR) 150 MG 24 hr capsule Take 150 mg by mouth daily.  07/03/17  Yes [provider]  XARELTO 20 MG TABS tablet Take 20 mg by mouth daily with supper.  08/07/17  Yes [provider]  ONE TOUCH ULTRA TEST test strip As directed 05/18/11   [provider]  Tiotropium Bromide-Olodaterol (STIOLTO RESPIMAT) 2.5-2.5 MCG/ACT AERS Inhale 2 puffs into the lungs daily. Patient not taking: Reported on 01/01/2018 06/05/17   Leslye PeerByrum, Robert S, MD    Family History Family History  Problem Relation Age of Onset  . Heart failure Mother   . Hyperlipidemia Mother   . Hypertension Mother   . Diabetes Mother   . CAD Mother   . Cancer Brother   . Stroke Maternal Grandfather   . Diabetes Brother     Social  History Social History   Tobacco Use  . Smoking status: Former Smoker    Packs/day: 2.00    Years: 30.00    Pack years: 60.00    Last attempt to quit: 01/09/1994    Years since quitting: 23.9  . Smokeless tobacco: Never Used  Substance Use Topics  . Alcohol use: No  . Drug use: Never     Allergies   Lisinopril and Sulfonamide derivatives   Review of Systems Review of Systems  Gastrointestinal: Positive for diarrhea.  Neurological: Positive for weakness.  All other systems reviewed and are negative.    Physical Exam Updated Vital Signs BP (!) 107/59   Pulse 95   Temp 97.6 F (36.4 C) (Oral)   Resp 20   Ht 5' (1.524 m)   Wt 64.9 kg   SpO2 98%  BMI 27.93 kg/m   Physical Exam Vitals signs and nursing note reviewed.  Constitutional:      Appearance: Normal appearance.  HENT:     Head: Normocephalic.     Mouth/Throat:     Comments: MM slightly dry  Eyes:     Comments: Conjunctiva slightly pale   Neck:     Musculoskeletal: Normal range of motion and neck supple.  Cardiovascular:     Rate and Rhythm: Normal rate and regular rhythm.     Pulses: Normal pulses.     Heart sounds: Normal heart sounds.  Pulmonary:     Effort: Pulmonary effort is normal.     Breath sounds: Normal breath sounds.  Abdominal:     General: Abdomen is flat.     Palpations: Abdomen is soft.  Genitourinary:    Comments: Rectal- small external hemorrhoid  Musculoskeletal: Normal range of motion.  Skin:    General: Skin is warm.     Capillary Refill: Capillary refill takes less than 2 seconds.  Neurological:     General: No focal deficit present.     Mental Status: She is alert and oriented to person, place, and time.  Psychiatric:        Mood and Affect: Mood normal.      ED Treatments / Results  Labs (all labs ordered are listed, but only abnormal results are displayed) Labs Reviewed  CBC WITH DIFFERENTIAL/PLATELET - Abnormal; Notable for the following components:       Result Value   RBC 3.67 (*)    Hemoglobin 7.6 (*)    HCT 28.4 (*)    MCV 77.4 (*)    MCH 20.7 (*)    MCHC 26.8 (*)    RDW 16.6 (*)    Platelets 474 (*)    All other components within normal limits  COMPREHENSIVE METABOLIC PANEL - Abnormal; Notable for the following components:   Glucose, Bld 112 (*)    Creatinine, Ser 1.27 (*)    Calcium 8.7 (*)    Total Protein 5.9 (*)    Albumin 3.1 (*)    GFR calc non Af Amer 42 (*)    GFR calc Af Amer 48 (*)    All other components within normal limits  VITAMIN B12 - Abnormal; Notable for the following components:   Vitamin B-12 >7,500 (*)    All other components within normal limits  IRON AND TIBC - Abnormal; Notable for the following components:   Iron 26 (*)    Saturation Ratios 6 (*)    All other components within normal limits  RETICULOCYTES - Abnormal; Notable for the following components:   RBC. 3.67 (*)    Immature Retic Fract 40.6 (*)    All other components within normal limits  URINALYSIS, ROUTINE W REFLEX MICROSCOPIC - Abnormal; Notable for the following components:   APPearance HAZY (*)    Protein, ur 30 (*)    Leukocytes, UA MODERATE (*)    Bacteria, UA RARE (*)    All other components within normal limits  URINE CULTURE  FOLATE  FERRITIN  I-STAT TROPONIN, ED  POC OCCULT BLOOD, ED  TYPE AND SCREEN  ABO/RH    EKG EKG Interpretation  Date/Time:  Tuesday January 01 2018 11:41:15 EST Ventricular Rate:  96 PR Interval:    QRS Duration: 83 QT Interval:  345 QTC Calculation: 436 R Axis:   78 Text Interpretation:  Sinus rhythm No significant change since last tracing Confirmed by Richardean Canal [phone])  on 01/01/2018 12:32:58 PM   Radiology Dg Chest 2 View  Result Date: 01/01/2018 CLINICAL DATA:  Weakness and hypotension. EXAM: CHEST - 2 VIEW COMPARISON:  07/29/2017 FINDINGS: Heart size appears normal. Aortic atherosclerosis. No pleural effusion or edema. Small lingular opacity is improved from previous exam and  may represent scarring from prior pneumonia. No new findings. IMPRESSION: Small lingular opacity noted which may reflect sequelae of prior pneumonia. Aortic Atherosclerosis (ICD10-I70.0). Electronically Signed   By: Signa Kell M.D.   On: 01/01/2018 14:16   Ct Abdomen Pelvis W Contrast  Result Date: 01/01/2018 CLINICAL DATA:  Weight loss, nausea and vomiting. Generalized abdominal pain. EXAM: CT ABDOMEN AND PELVIS WITH CONTRAST TECHNIQUE: Multidetector CT imaging of the abdomen and pelvis was performed using the standard protocol following bolus administration of intravenous contrast. CONTRAST:  OMNIPAQUE IOHEXOL 300 MG/ML  SOLN COMPARISON:  None. FINDINGS: Lower chest: Minimal bibasilar atelectasis or scarring. Hepatobiliary: No focal liver abnormality is seen. Status post cholecystectomy. No biliary dilatation. Pancreas: Unremarkable. No pancreatic ductal dilatation or surrounding inflammatory changes. Spleen: Normal in size without focal abnormality. Adrenals/Urinary Tract: Normal appearing adrenal glands. Multiple bilateral renal cysts. Normal appearing urinary bladder and ureters. Stomach/Bowel: Stomach is within normal limits. Appendix appears normal. No evidence of bowel wall thickening, distention, or inflammatory changes. Vascular/Lymphatic: Atheromatous arterial calcifications without aneurysm. No enlarged lymph nodes. Reproductive: Uterus and bilateral adnexa are unremarkable. Other: Small umbilical hernia containing fat. Musculoskeletal: Lower thoracic spine degenerative changes and mild lumbar spine degenerative changes. IMPRESSION: No acute abnormality. Electronically Signed   By: Beckie Salts M.D.   On: 01/01/2018 14:43    Procedures Procedures (including critical care time)  Medications Ordered in ED Medications  sodium chloride 0.9 % bolus 1,000 mL (has no administration in time range)  cefTRIAXone (ROCEPHIN) 1 g in sodium chloride 0.9 % 100 mL IVPB (1 g Intravenous New  Bag/Given 01/01/18 1511)  sodium chloride 0.9 % bolus 1,000 mL (0 mLs Intravenous Stopped 01/01/18 1405)  iohexol (OMNIPAQUE) 300 MG/ML solution 100 mL (100 mLs Intravenous Contrast Given 01/01/18 1437)     Initial Impression / Assessment and Plan / ED Course  I have reviewed the triage vital signs and the nursing notes.  Pertinent labs & imaging results that were available during my care of the patient were reviewed by me and considered in my medical decision making (see chart for details).    [name] [name] is a 72 y.o. female here with hypotension, anemia. She is on xarelto so consider slow GI bleed. Also has known B12 deficiency. I don't know why she is hypotensive. She is afebrile and no vomiting. Consider slow RP bleed as well. Will get labs, CT ab/pel, UA, CXR, anemia panel. Will hydrate and reassess.   3:30 PM UA + UTI, given abx. Hg 7.6 but occ negative (stable from a week ago). Iron level low, will start on iron supplement. CT ab/pel unremarkable. CXR showed sequelae of prior pneumonia. WBC count is normal. Given rocephin. BP improved to 107/59 after IVF. Stable for discharge. Hypotension likely from UTI.    Final Clinical Impressions(s) / ED Diagnoses   Final diagnoses:  None    ED Discharge Orders    None       Charlynne Pander, MD 01/01/18 [phone]

## 2018-01-01 NOTE — ED Notes (Signed)
EKG given to EDP.  

## 2018-01-02 LAB — URINE CULTURE: Culture: 60000 — AB

## 2018-01-07 ENCOUNTER — Other Ambulatory Visit: Payer: Self-pay | Admitting: Pharmacy Technician

## 2018-01-07 NOTE — Patient Outreach (Signed)
[facility] Care Management  01/07/2018  [name] [name] 09/25/1945 [id number]    Successful call placed to patient regarding patient assistance application(s) for Stiolto and Proventil HFA , HIPAA identifiers verified. [name] confirms that she has received applications and plans to have them in the mail in the next couple of days.  Will follow up with patient in 10-14 business days if documents have not been received.   Suzan SlickAshley N. Effie Shyoleman, CPhT Certified Pharmacy Technician [facility] Care Management Direct [phone]

## 2018-01-08 DIAGNOSIS — J449 Chronic obstructive pulmonary disease, unspecified: Secondary | ICD-10-CM | POA: Diagnosis not present

## 2018-01-14 ENCOUNTER — Telehealth: Payer: Self-pay | Admitting: Hematology and Oncology

## 2018-01-14 ENCOUNTER — Inpatient Hospital Stay: Payer: HMO | Attending: Hematology and Oncology | Admitting: Hematology and Oncology

## 2018-01-14 DIAGNOSIS — Z86711 Personal history of pulmonary embolism: Secondary | ICD-10-CM | POA: Insufficient documentation

## 2018-01-14 DIAGNOSIS — Z87891 Personal history of nicotine dependence: Secondary | ICD-10-CM | POA: Diagnosis not present

## 2018-01-14 DIAGNOSIS — K5900 Constipation, unspecified: Secondary | ICD-10-CM | POA: Insufficient documentation

## 2018-01-14 DIAGNOSIS — Z79899 Other long term (current) drug therapy: Secondary | ICD-10-CM | POA: Diagnosis not present

## 2018-01-14 DIAGNOSIS — Z7901 Long term (current) use of anticoagulants: Secondary | ICD-10-CM | POA: Diagnosis not present

## 2018-01-14 DIAGNOSIS — J449 Chronic obstructive pulmonary disease, unspecified: Secondary | ICD-10-CM | POA: Diagnosis not present

## 2018-01-14 DIAGNOSIS — E119 Type 2 diabetes mellitus without complications: Secondary | ICD-10-CM | POA: Diagnosis not present

## 2018-01-14 DIAGNOSIS — Z7982 Long term (current) use of aspirin: Secondary | ICD-10-CM | POA: Diagnosis not present

## 2018-01-14 DIAGNOSIS — D509 Iron deficiency anemia, unspecified: Secondary | ICD-10-CM | POA: Diagnosis not present

## 2018-01-14 DIAGNOSIS — R5383 Other fatigue: Secondary | ICD-10-CM | POA: Diagnosis not present

## 2018-01-14 DIAGNOSIS — I10 Essential (primary) hypertension: Secondary | ICD-10-CM | POA: Insufficient documentation

## 2018-01-14 DIAGNOSIS — E538 Deficiency of other specified B group vitamins: Secondary | ICD-10-CM | POA: Diagnosis not present

## 2018-01-14 DIAGNOSIS — Z7984 Long term (current) use of oral hypoglycemic drugs: Secondary | ICD-10-CM | POA: Insufficient documentation

## 2018-01-14 NOTE — Telephone Encounter (Signed)
Gave avs and calendar ° °

## 2018-01-14 NOTE — Progress Notes (Signed)
[facility] CONSULT NOTE  Patient Care Team: Shirlean Mylar, MD as PCP - General (Family Medicine) Delton Coombes Les Pou, MD (Pulmonary Disease) Maple Mirza, RN as [facility] Care Management [name], [facility] (Pharmacist) Regan Rakers, CPhT as [facility] Care Management (Pharmacy Technician) Yetta Glassman, RN as [facility] Care Management  CHIEF COMPLAINTS/PURPOSE OF CONSULTATION:  Microcytic anemia  HISTORY OF PRESENTING ILLNESS:  [name] [name] 73 y.o. female is here because of recent diagnosis of iron deficiency anemia.  Patient was in the hospital in July 2019 for acute pulmonary embolism and was started on Xarelto.  By the time she left the hospital her hemoglobin was 10.5.  On 01/01/2018 her hemoglobin had dropped down and she was feeling fatigue shortness of breath and lightheadedness or dizziness.  She does not report any blood loss in the stool.  Stool occults were also performed which were negative.  She was placed on oral iron therapy around Christmas time and she has been constipated as result of oral iron.  She does not have any vaginal or urinary bleeding.  She also takes B12 injections for B12 deficiency.     I reviewed her records extensively and collaborated the history with the patient.   MEDICAL HISTORY:  Past Medical History:  Diagnosis Date  . Allergic rhinitis, cause unspecified   . CAD in native artery 07/25/2017  . Chronic airway obstruction, not elsewhere classified   . Depressive disorder, not elsewhere classified   . Nephritis and nephropathy, not specified as acute or chronic, with unspecified pathological lesion in kidney   . Obesity, unspecified   . Other and unspecified hyperlipidemia   . Oxygen deficiency   . Thyrotoxicosis without mention of goiter or other cause, without mention of thyrotoxic crisis or storm   . Unspecified essential hypertension     SURGICAL HISTORY: Past  Surgical History:  Procedure Laterality Date  . CHOLECYSTECTOMY    . LEFT HEART CATH AND CORONARY ANGIOGRAPHY N/A 03/01/2017   Procedure: LEFT HEART CATH AND CORONARY ANGIOGRAPHY;  Surgeon: Yvonne Kendall, MD;  Location: [facility] INVASIVE CV LAB;  Service: Cardiovascular;  Laterality: N/A;    SOCIAL HISTORY: Social History   Socioeconomic History  . Marital status: Widowed    Spouse name: Not on file  . Number of children: Not on file  . Years of education: Not on file  . Highest education level: Not on file  Occupational History  . Not on file  Social Needs  . Financial resource strain: Not on file  . Food insecurity:    Worry: Not on file    Inability: Not on file  . Transportation needs:    Medical: Not on file    Non-medical: Not on file  Tobacco Use  . Smoking status: Former Smoker    Packs/day: 2.00    Years: 30.00    Pack years: 60.00    Last attempt to quit: 01/09/1994    Years since quitting: 24.0  . Smokeless tobacco: Never Used  Substance and Sexual Activity  . Alcohol use: No  . Drug use: Never  . Sexual activity: Not on file  Lifestyle  . Physical activity:    Days per week: Not on file    Minutes per session: Not on file  . Stress: Not on file  Relationships  . Social connections:    Talks on phone: Not on file    Gets together: Not on file  Attends religious service: Not on file    Active member of club or organization: Not on file    Attends meetings of clubs or organizations: Not on file    Relationship status: Not on file  . Intimate partner violence:    Fear of current or ex partner: Not on file    Emotionally abused: Not on file    Physically abused: Not on file    Forced sexual activity: Not on file  Other Topics Concern  . Not on file  Social History Narrative  . Not on file    FAMILY HISTORY: Family History  Problem Relation Age of Onset  . Heart failure Mother   . Hyperlipidemia Mother   . Hypertension Mother   . Diabetes Mother    . CAD Mother   . Cancer Brother   . Stroke Maternal Grandfather   . Diabetes Brother     ALLERGIES:  is allergic to lisinopril and sulfonamide derivatives.  MEDICATIONS:  Current Outpatient Medications  Medication Sig Dispense Refill  . albuterol (PROAIR HFA) 108 (90 Base) MCG/ACT inhaler INHALE 2 PUFFS INTO THE LUNGS EVERY 6 (SIX) HOURS AS NEEDED FOR WHEEZING OR SHORTNESS OF BREATH. (Patient taking differently: Inhale 2 puffs into the lungs every 6 (six) hours as needed for wheezing or shortness of breath. ) 3 Inhaler 1  . albuterol (PROVENTIL) (2.5 MG/3ML) 0.083% nebulizer solution Take 3 mLs (2.5 mg total) by nebulization every 4 (four) hours as needed for wheezing or shortness of breath (DX: COPD 496). 150 mL 12  . Ascorbic Acid (VITAMIN C) 1000 MG tablet Take 1,000 mg by mouth daily.      Marland Kitchen. aspirin 81 MG chewable tablet Chew 81 mg by mouth at bedtime.     Marland Kitchen. atenolol (TENORMIN) 50 MG tablet Take 50 mg by mouth daily.      Marland Kitchen. atorvastatin (LIPITOR) 20 MG tablet Take 20 mg by mouth daily.      . Calcium Carbonate-Vitamin D (CALTRATE 600+D) 600-400 MG-UNIT per tablet Take 2 tablets by mouth daily.      . cephALEXin (KEFLEX) 500 MG capsule Take 1 capsule (500 mg total) by mouth 3 (three) times daily. 21 capsule 0  . ferrous sulfate 325 (65 FE) MG tablet Take 1 tablet (325 mg total) by mouth daily. 30 tablet 0  . fexofenadine (ALLEGRA) 180 MG tablet Take 180 mg by mouth daily.    . fluticasone (FLONASE) 50 MCG/ACT nasal spray Place 2 sprays into both nostrils daily. 16 g 5  . levothyroxine (SYNTHROID, LEVOTHROID) 100 MCG tablet Take 100 mcg by mouth daily.    Marland Kitchen. losartan (COZAAR) 25 MG tablet Take 25 mg by mouth daily.    . metFORMIN (GLUCOPHAGE) 500 MG tablet Take 500 mg by mouth 2 (two) times daily with a meal.     . Multiple Vitamins-Minerals (CENTRUM PO) Take 1 tablet by mouth daily.      . naproxen sodium (ALEVE) 220 MG tablet Take 220 mg by mouth daily as needed (pain).    .  nitroGLYCERIN (NITROSTAT) 0.4 MG SL tablet Place 1 tablet (0.4 mg total) under the tongue every 5 (five) minutes as needed for chest pain. 25 tablet 4  . omeprazole (PRILOSEC) 20 MG capsule Take 1 capsule (20 mg total) by mouth 2 (two) times daily before a meal. 30 capsule 5  . ONE TOUCH ULTRA TEST test strip As directed    . Tiotropium Bromide-Olodaterol (STIOLTO RESPIMAT) 2.5-2.5 MCG/ACT AERS Inhale 2 puffs into  the lungs daily. (Patient not taking: Reported on 01/01/2018) 3 Inhaler 1  . Tiotropium Bromide-Olodaterol (STIOLTO RESPIMAT) 2.5-2.5 MCG/ACT AERS Inhale 2 puffs into the lungs daily for 28 days. 2 Inhaler 0  . traZODone (DESYREL) 50 MG tablet Take 50 mg by mouth at bedtime.     Marland Kitchen venlafaxine XR (EFFEXOR-XR) 150 MG 24 hr capsule Take 150 mg by mouth daily.     [name] 20 MG TABS tablet Take 20 mg by mouth daily with supper.   5   No current facility-administered medications for this visit.     REVIEW OF SYSTEMS:   Constitutional: Denies fevers, chills or abnormal night sweats, complains of fatigue and shortness of breath to exertion Eyes: Denies blurriness of vision, double vision or watery eyes Ears, nose, mouth, throat, and face: Denies mucositis or sore throat Respiratory: Chronic shortness of breath due to COPD, uses 24-hour oxygen Cardiovascular: Denies palpitation, chest discomfort or lower extremity swelling Gastrointestinal: Complains of constipation due to oral iron Skin: Denies abnormal skin rashes Lymphatics: Denies new lymphadenopathy or easy bruising Neurological:Denies numbness, tingling or new weaknesses Behavioral/Psych: Mood is stable, no new changes    All other systems were reviewed with the patient and are negative.  PHYSICAL EXAMINATION: ECOG PERFORMANCE STATUS: 2 - Symptomatic, <50% confined to bed  Vitals:   01/14/18 1508  BP: 105/63  Pulse: (!) 110  Resp: 17  Temp: (!) 97.2 F (36.2 C)  SpO2: 97%   Filed Weights   01/14/18 1508  Weight: 144  lb 11.2 oz (65.6 kg)    GENERAL:alert, no distress and comfortable SKIN: skin color, texture, turgor are normal, no rashes or significant lesions EYES: normal, conjunctiva are pink and non-injected, sclera clear OROPHARYNX:no exudate, no erythema and lips, buccal mucosa, and tongue normal  NECK: supple, thyroid normal size, non-tender, without nodularity LYMPH:  no palpable lymphadenopathy in the cervical, axillary or inguinal LUNGS: clear to auscultation and percussion with normal breathing effort HEART: regular rate & rhythm and no murmurs and no lower extremity edema ABDOMEN:abdomen soft, non-tender and normal bowel sounds Musculoskeletal:no cyanosis of digits and no clubbing  PSYCH: alert & oriented x 3 with fluent speech NEURO: no focal motor/sensory deficits   LABORATORY DATA:  I have reviewed the data as listed Lab Results  Component Value Date   WBC 9.6 01/01/2018   HGB 7.6 (L) 01/01/2018   HCT 28.4 (L) 01/01/2018   MCV 77.4 (L) 01/01/2018   PLT 474 (H) 01/01/2018   Lab Results  Component Value Date   NA 138 01/01/2018   K 4.1 01/01/2018   CL 106 01/01/2018   CO2 23 01/01/2018    RADIOGRAPHIC STUDIES: I have personally reviewed the radiological reports and agreed with the findings in the report.  ASSESSMENT AND PLAN:  Iron deficiency anemia With negative fecal stool Hemoccults detected when she went to the emergency room January 01, 2018 with hypertension Lab review 01/01/2018: Hemoglobin 7.6, MCV 77.4, platelets 474, RDW 16.6 Iron studies: Iron saturation 6%, TIBC 442, ferritin 18 Reticulocytes 2%, absolute 73.8 Patient was started on oral iron supplementation. Currently on Xarelto.  Recommendation: 1.  GI evaluation: I sent a message to Dr. Randa Evens to see if he can see her sooner for upper endoscopy in addition to the colonoscopy that she had planned for February. 2.  Intravenous iron therapy: 2 doses of Feraheme 1 week apart will be planned. She could stop  taking oral iron therapy after she receives IV iron.  I discussed  with her about different causes of iron deficiency including blood loss as well as malabsorption. Return to clinic in 3 months with labs and follow-up     All questions were answered. The patient knows to call the clinic with any problems, questions or concerns.    Tamsen MeekViinay K Raquon Milledge, MD 01/14/18

## 2018-01-14 NOTE — Assessment & Plan Note (Addendum)
With negative fecal stool Hemoccults detected when she went to the emergency room January 01, 2018 with hypertension Lab review 01/01/2018: Hemoglobin 7.6, MCV 77.4, platelets 474, RDW 16.6 Iron studies: Iron saturation 6%, TIBC 442, ferritin 18 Reticulocytes 2%, absolute 73.8 Patient was started on oral iron supplementation. Currently on Xarelto.  Recommendation: 1.  GI evaluation 2.  Intravenous iron therapy  Return to clinic in 3 months with labs and follow-up

## 2018-01-18 ENCOUNTER — Inpatient Hospital Stay: Payer: HMO

## 2018-01-18 VITALS — BP 90/57 | HR 94 | Temp 97.4°F | Resp 16

## 2018-01-18 DIAGNOSIS — D509 Iron deficiency anemia, unspecified: Secondary | ICD-10-CM | POA: Diagnosis not present

## 2018-01-18 MED ORDER — SODIUM CHLORIDE 0.9 % IV SOLN
Freq: Once | INTRAVENOUS | Status: AC
  Administered 2018-01-18: 09:00:00 via INTRAVENOUS
  Filled 2018-01-18: qty 250

## 2018-01-18 MED ORDER — SODIUM CHLORIDE 0.9 % IV SOLN
510.0000 mg | Freq: Once | INTRAVENOUS | Status: AC
  Administered 2018-01-18: 510 mg via INTRAVENOUS
  Filled 2018-01-18: qty 17

## 2018-01-18 NOTE — Patient Instructions (Signed)

## 2018-01-23 ENCOUNTER — Other Ambulatory Visit: Payer: Self-pay | Admitting: Pharmacy Technician

## 2018-01-23 NOTE — Patient Outreach (Signed)
[facility]) Care Management  01/23/2018  [name] [name] [birth year] [id number]   Received patient portions of applications for Stiolto and Proventil HFA. Faxed completed Stiolto application into [employer]. Need updated provider license number expire date in order to submit Proventil HFA application to [employer].  Will follow up with [employer] in 7-10 business days to check status of application.  Will submit [employer] application once license information has been updated.  [name] [name] CPhT Certified Pharmacy Technician [facility] Care Management Direct Dial:[phone]

## 2018-01-24 ENCOUNTER — Other Ambulatory Visit: Payer: Self-pay | Admitting: Pharmacy Technician

## 2018-01-24 NOTE — Patient Outreach (Signed)
[facility]) Care Management  01/24/2018  [name] [name] 1945-05-04 [id number]   Prepared completed [employer] application for Proventil HFA to be mailed to company.  Will follow up with company in 10-14 business days to check status of application.  [name] [name] CPhT Certified Pharmacy Technician [facility] Care Management Direct Dial:[phone]

## 2018-01-25 ENCOUNTER — Inpatient Hospital Stay: Payer: HMO

## 2018-01-25 VITALS — BP 95/56 | HR 82 | Temp 97.9°F | Resp 18

## 2018-01-25 DIAGNOSIS — D509 Iron deficiency anemia, unspecified: Secondary | ICD-10-CM

## 2018-01-25 MED ORDER — SODIUM CHLORIDE 0.9 % IV SOLN
510.0000 mg | Freq: Once | INTRAVENOUS | Status: AC
  Administered 2018-01-25: 510 mg via INTRAVENOUS
  Filled 2018-01-25: qty 17

## 2018-01-25 MED ORDER — SODIUM CHLORIDE 0.9 % IV SOLN
Freq: Once | INTRAVENOUS | Status: AC
  Administered 2018-01-25: 09:00:00 via INTRAVENOUS
  Filled 2018-01-25: qty 250

## 2018-01-25 NOTE — Patient Instructions (Signed)

## 2018-01-29 DIAGNOSIS — E559 Vitamin D deficiency, unspecified: Secondary | ICD-10-CM | POA: Diagnosis not present

## 2018-01-29 DIAGNOSIS — I10 Essential (primary) hypertension: Secondary | ICD-10-CM | POA: Diagnosis not present

## 2018-01-29 DIAGNOSIS — J449 Chronic obstructive pulmonary disease, unspecified: Secondary | ICD-10-CM | POA: Diagnosis not present

## 2018-01-29 DIAGNOSIS — Z0000 Encounter for general adult medical examination without abnormal findings: Secondary | ICD-10-CM | POA: Diagnosis not present

## 2018-01-29 DIAGNOSIS — E039 Hypothyroidism, unspecified: Secondary | ICD-10-CM | POA: Diagnosis not present

## 2018-01-29 DIAGNOSIS — F320 Major depressive disorder, single episode, mild: Secondary | ICD-10-CM | POA: Diagnosis not present

## 2018-01-29 DIAGNOSIS — E1121 Type 2 diabetes mellitus with diabetic nephropathy: Secondary | ICD-10-CM | POA: Diagnosis not present

## 2018-01-29 DIAGNOSIS — E785 Hyperlipidemia, unspecified: Secondary | ICD-10-CM | POA: Diagnosis not present

## 2018-01-31 ENCOUNTER — Other Ambulatory Visit: Payer: Self-pay | Admitting: *Deleted

## 2018-01-31 NOTE — Patient Outreach (Signed)
[facility]) Care Management  01/31/2018  [name] [name] [birth year] [id number]   RN Health Coach Monthly Outreach  Referral Date:08/02/2017 Referral Source:EMMI COPD Red Alert Screening Reason for Referral:Disease Management Education Insurance:[employer] CS[employer]   Outreach Attempt:  Patient has transitioned her insurance to the [employer] program and will be follow by [facility] CSNP Care Management Team.  Successful telephone outreach to patient to notify of program transfer and follow up post emergency room visit.  HIPAA verified with patient.  Patient stating she is feeling better after treatment of her urinary tract infection.  Denies any lightheadedness or dizziness.  Discussed discontinuing Disease Management services and that [employer] CS[employer] Care Manager [name] will be contacting patient within the next few months.  Patient verbalized understanding.  Plan: RN Health Coach will send primary care provider Discipline Closure Letter. RN Health Coach will close Disease Management Case.  Rhae Lerner RN [facility] Care Management  RN Health Coach [phone] [email or web address]

## 2018-02-06 DIAGNOSIS — J449 Chronic obstructive pulmonary disease, unspecified: Secondary | ICD-10-CM | POA: Diagnosis not present

## 2018-02-12 DIAGNOSIS — J449 Chronic obstructive pulmonary disease, unspecified: Secondary | ICD-10-CM | POA: Diagnosis not present

## 2018-02-12 DIAGNOSIS — R5382 Chronic fatigue, unspecified: Secondary | ICD-10-CM | POA: Diagnosis not present

## 2018-02-12 DIAGNOSIS — Z9981 Dependence on supplemental oxygen: Secondary | ICD-10-CM | POA: Diagnosis not present

## 2018-02-12 DIAGNOSIS — D509 Iron deficiency anemia, unspecified: Secondary | ICD-10-CM | POA: Diagnosis not present

## 2018-02-12 DIAGNOSIS — Z7984 Long term (current) use of oral hypoglycemic drugs: Secondary | ICD-10-CM | POA: Diagnosis not present

## 2018-02-12 DIAGNOSIS — Z1211 Encounter for screening for malignant neoplasm of colon: Secondary | ICD-10-CM | POA: Diagnosis not present

## 2018-02-12 DIAGNOSIS — E1121 Type 2 diabetes mellitus with diabetic nephropathy: Secondary | ICD-10-CM | POA: Diagnosis not present

## 2018-02-13 ENCOUNTER — Telehealth: Payer: Self-pay | Admitting: Emergency Medicine

## 2018-02-13 MED ORDER — TIOTROPIUM BROMIDE-OLODATEROL 2.5-2.5 MCG/ACT IN AERS: 2.0000 | INHALATION_SPRAY | Freq: Every day | RESPIRATORY_TRACT | 0 refills | Status: DC

## 2018-02-13 NOTE — Telephone Encounter (Signed)
Called and spoke with Patient. She requested Stiolto sample.  Stiolto sample placed at front desk for Patient to pick up. Understanding stated.  Nothing further at this time.

## 2018-02-18 ENCOUNTER — Ambulatory Visit: Payer: Self-pay | Admitting: *Deleted

## 2018-02-21 ENCOUNTER — Telehealth: Payer: Self-pay | Admitting: Emergency Medicine

## 2018-02-21 ENCOUNTER — Other Ambulatory Visit: Payer: Self-pay | Admitting: Pharmacy Technician

## 2018-02-21 MED ORDER — TIOTROPIUM BROMIDE-OLODATEROL 2.5-2.5 MCG/ACT IN AERS: 2.0000 | INHALATION_SPRAY | Freq: Every day | RESPIRATORY_TRACT | 0 refills | Status: DC

## 2018-02-21 NOTE — Telephone Encounter (Signed)
Samples have been placed up front for pick up.  ATC pt, no answer and I could not leave message. Will try back.

## 2018-02-21 NOTE — Patient Outreach (Signed)
[facility]) Care Management  02/21/2018  [name] [name] Aug 27, 1945 [id number]    Follow up call placed to [employer] regarding patient assistance application(s) for Proventil HFA , [name] confirms patients application has been received and attestation form is mailed out 2/13.   Will follow up with patient in 2-3 business days to inform.  [name] [name] CPhT Certified Pharmacy Technician [facility] Care Management Direct Dial:[phone]

## 2018-02-22 NOTE — Telephone Encounter (Signed)
ATC pt, no answer. Left message for pt to call back.  

## 2018-02-25 ENCOUNTER — Other Ambulatory Visit: Payer: Self-pay | Admitting: Pharmacy Technician

## 2018-02-25 DIAGNOSIS — E039 Hypothyroidism, unspecified: Secondary | ICD-10-CM | POA: Diagnosis not present

## 2018-02-25 NOTE — Telephone Encounter (Signed)
ATC Patient.  Left detailed message to let her know that samples were placed at front desk for pick up, and to return call.

## 2018-02-25 NOTE — Telephone Encounter (Signed)
Pt picked up medicine. Nothing further needed at this time- will close encounter.

## 2018-02-25 NOTE — Patient Outreach (Signed)
[facility]) Care Management  02/25/2018  [name] [name] [birth year] [id number]    Successful call placed to patient regarding patient assistance application(s) for Stiolto and Proventil HFA , HIPAA identifiers verified. Informed patient that [name] has been denied however we need to send a printout of drug cost to the company. She states she hasn't filled it because pharmacy told her that it is $90 for 1 month supply. Reached out to contact at [employer] for co-pay verification.  Informed patient that [employer] out attestation form to her on 2/13, requested that she contact me if she needs help filling it out and/or to let me know when she is mailing it back to company so that I can follow up with it.  Will reach out to patient in 2-3 business days to update about [employer] and check on attestation form.  [name] [name] [name] CPhT Certified Pharmacy Technician [facility] Care Management Direct Dial:[phone]

## 2018-02-28 ENCOUNTER — Other Ambulatory Visit: Payer: Self-pay | Admitting: Pharmacy Technician

## 2018-02-28 NOTE — Patient Outreach (Signed)
[facility]) Care Management  02/28/2018  [name] [name] August 21, 1945 [id number]   Faxed [employer] benefits verification into [employer] to start appeal process for Stiolto.  Will follow up with company in 5-7 business days to check status of application.  [name] [name] CPhT Certified Pharmacy Technician [facility] Care Management Direct Dial:[phone]

## 2018-03-05 ENCOUNTER — Ambulatory Visit: Payer: HMO | Admitting: Nurse Practitioner

## 2018-03-05 ENCOUNTER — Encounter: Payer: Self-pay | Admitting: Nurse Practitioner

## 2018-03-05 VITALS — BP 110/70 | HR 92 | Ht 60.0 in | Wt 146.8 lb

## 2018-03-05 DIAGNOSIS — J069 Acute upper respiratory infection, unspecified: Secondary | ICD-10-CM

## 2018-03-05 MED ORDER — PREDNISONE 10 MG PO TABS: ORAL_TABLET | ORAL | 0 refills | Status: DC

## 2018-03-05 MED ORDER — DOXYCYCLINE HYCLATE 100 MG PO TABS: 100.0000 mg | ORAL_TABLET | Freq: Two times a day (BID) | ORAL | 0 refills | Status: DC

## 2018-03-05 NOTE — Progress Notes (Signed)
  ID: [name] [name], female    DOB: 11-13-1945, 73 y.o.   MRN: [id number]  Chief Complaint  Patient presents with  . Acute Visit    prod cough started Sunday and pain right side    Referring provider: Shirlean Mylar, MD  HPI  73 year old female former smoker with moderate COPD, upper airway irritation syndrome, chronic cough, and allergic rhinitis is followed by Dr. Delton Coombes.  Tests:   CTA 07/27/18 - Bilateral lower lobe pulmonary emboli with mild right heart strain, RV/LV ratio 1. Positive for acute PE with CT evidence of right heart strain (RV/LV Ratio = 1) consistent with at least submassive (intermediate risk) PE. The presence of right heart strain has been associated with an increased risk of morbidity and mortality. Pulmonary consolidation in the lingula suspicious for pneumonia with reactive mediastinal adenopathy. Tree-in-bud opacities in the lower lobes be keeping with bronchiolitis.  OV 03/05/18 - Acute cough and chest congestion Patient presents today with cough and chest congestion.  She states that her cough has been productive of green sputum.  Symptoms started a week ago.  States that symptoms are progressively worsening.  She denies any fever.  Denies any sinus congestion.  She has been compliant with Stiolto, Allegra, and Flonase. She states that she has not been using her Proventil inhaler or nebulizer.  Denies any shortness of breath, chest pain, or edema.      Allergies  Allergen Reactions  . Lisinopril     cough  . Sulfonamide Derivatives Rash    Immunization History  Administered Date(s) Administered  . Influenza Whole 12/09/2008, 10/09/2009, 10/02/2010, 09/10/2011  . Influenza, High Dose Seasonal PF 10/07/2016, 10/15/2017  . Influenza,inj,Quad PF,6+ Mos 10/01/2012, 10/08/2013, 09/30/2014, 10/11/2015  . Pneumococcal Conjugate-13 10/09/2013  . Pneumococcal-Unspecified 10/02/2010    Past Medical History:  Diagnosis Date  . Allergic rhinitis, cause  unspecified   . CAD in native artery 07/25/2017  . Chronic airway obstruction, not elsewhere classified   . Depressive disorder, not elsewhere classified   . Nephritis and nephropathy, not specified as acute or chronic, with unspecified pathological lesion in kidney   . Obesity, unspecified   . Other and unspecified hyperlipidemia   . Oxygen deficiency   . Thyrotoxicosis without mention of goiter or other cause, without mention of thyrotoxic crisis or storm   . Unspecified essential hypertension   . UTI (urinary tract infection) 2019 dec    Tobacco History: Social History   Tobacco Use  Smoking Status Former Smoker  . Packs/day: 2.00  . Years: 30.00  . Pack years: 60.00  . Last attempt to quit: 01/09/1994  . Years since quitting: 24.1  Smokeless Tobacco Never Used   Counseling given: Yes   Outpatient Encounter Medications as of 03/05/2018  Medication Sig  . albuterol (PROAIR HFA) 108 (90 Base) MCG/ACT inhaler INHALE 2 PUFFS INTO THE LUNGS EVERY 6 (SIX) HOURS AS NEEDED FOR WHEEZING OR SHORTNESS OF BREATH. (Patient taking differently: Inhale 2 puffs into the lungs every 6 (six) hours as needed for wheezing or shortness of breath. )  . albuterol (PROVENTIL) (2.5 MG/3ML) 0.083% nebulizer solution Take 3 mLs (2.5 mg total) by nebulization every 4 (four) hours as needed for wheezing or shortness of breath (DX: COPD 496).  . Ascorbic Acid (VITAMIN C) 1000 MG tablet Take 1,000 mg by mouth daily.    Marland Kitchen aspirin 81 MG chewable tablet Chew 81 mg by mouth at bedtime.   Marland Kitchen atenolol (TENORMIN) 50 MG tablet Take 50 mg  by mouth daily.    Marland Kitchen atorvastatin (LIPITOR) 20 MG tablet Take 20 mg by mouth daily.    . Calcium Carbonate-Vitamin D (CALTRATE 600+D) 600-400 MG-UNIT per tablet Take 2 tablets by mouth daily.    . cephALEXin (KEFLEX) 500 MG capsule Take 1 capsule (500 mg total) by mouth 3 (three) times daily.  . ferrous sulfate 325 (65 FE) MG tablet Take 1 tablet (325 mg total) by mouth daily.  .  fexofenadine (ALLEGRA) 180 MG tablet Take 180 mg by mouth daily.  . fluticasone (FLONASE) 50 MCG/ACT nasal spray Place 2 sprays into both nostrils daily.  Marland Kitchen levothyroxine (SYNTHROID, LEVOTHROID) 100 MCG tablet Take 100 mcg by mouth daily.  Marland Kitchen losartan (COZAAR) 25 MG tablet Take 25 mg by mouth daily.  . metFORMIN (GLUCOPHAGE) 500 MG tablet Take 500 mg by mouth 2 (two) times daily with a meal.   . Multiple Vitamins-Minerals (CENTRUM PO) Take 1 tablet by mouth daily.    . naproxen sodium (ALEVE) 220 MG tablet Take 220 mg by mouth daily as needed (pain).  Marland Kitchen omeprazole (PRILOSEC) 20 MG capsule Take 1 capsule (20 mg total) by mouth 2 (two) times daily before a meal.  . ONE TOUCH ULTRA TEST test strip As directed  . Tiotropium Bromide-Olodaterol (STIOLTO RESPIMAT) 2.5-2.5 MCG/ACT AERS Inhale 2 puffs into the lungs daily.  . traZODone (DESYREL) 50 MG tablet Take 50 mg by mouth at bedtime.   Marland Kitchen venlafaxine XR (EFFEXOR-XR) 150 MG 24 hr capsule Take 150 mg by mouth daily.   [name] 20 MG TABS tablet Take 20 mg by mouth daily with supper.   . doxycycline (VIBRA-TABS) 100 MG tablet Take 1 tablet (100 mg total) by mouth 2 (two) times daily.  . nitroGLYCERIN (NITROSTAT) 0.4 MG SL tablet Place 1 tablet (0.4 mg total) under the tongue every 5 (five) minutes as needed for chest pain.  . predniSONE (DELTASONE) 10 MG tablet Take 4 tabs for 2 days, then 3 tabs for 2 days, then 2 tabs for 2 days, then 1 tab for 2 days, then stop   No facility-administered encounter medications on file as of 03/05/2018.      Review of Systems  Review of Systems  Constitutional: Negative.  Negative for chills and fever.  HENT: Negative.   Respiratory: Positive for cough (productive green sputum). Negative for shortness of breath.   Cardiovascular: Negative.  Negative for chest pain, palpitations and leg swelling.  Gastrointestinal: Negative.   Allergic/Immunologic: Negative.   Neurological: Negative.   Psychiatric/Behavioral:  Negative.        Physical Exam  BP 110/70 (BP Location: Right Arm, Patient Position: Sitting, Cuff Size: Normal)   Pulse 92   Ht 5' (1.524 m)   Wt 146 lb 12.8 oz (66.6 kg)   SpO2 96%   BMI 28.67 kg/m   Wt Readings from Last 5 Encounters:  03/05/18 146 lb 12.8 oz (66.6 kg)  01/14/18 144 lb 11.2 oz (65.6 kg)  01/01/18 143 lb (64.9 kg)  12/27/17 149 lb (67.6 kg)  11/16/17 149 lb 3.2 oz (67.7 kg)     Physical Exam Vitals signs and nursing note reviewed.  Constitutional:      General: She is not in acute distress.    Appearance: She is well-developed.  Cardiovascular:     Rate and Rhythm: Normal rate and regular rhythm.  Pulmonary:     Effort: Pulmonary effort is normal. No respiratory distress.     Breath sounds: Normal breath sounds. No  wheezing or rhonchi.     Comments: Congested productive cough noted in office Musculoskeletal:        General: No swelling.  Neurological:     Mental Status: She is alert and oriented to person, place, and time.       Assessment & Plan:   Upper respiratory tract infection Patient presents today with acute cough and chest congestion.  Does have a history of chronic cough but states that her cough has been productive of green sputum.  Will treat for URI.  Patient Instructions  Will order doxycycline Will order prednisone taper Continue stiolto Continue albuterol as needed May take mucinex twice daily Follow up in 1 week with me or sooner if needed       Ivonne Andrew, NP 03/06/2018

## 2018-03-05 NOTE — Patient Instructions (Addendum)
Will order doxycycline Will order prednisone taper Continue stiolto Continue albuterol as needed May take mucinex twice daily Follow up in 1 week with me or sooner if needed

## 2018-03-06 ENCOUNTER — Encounter: Payer: Self-pay | Admitting: Nurse Practitioner

## 2018-03-06 DIAGNOSIS — J069 Acute upper respiratory infection, unspecified: Secondary | ICD-10-CM | POA: Insufficient documentation

## 2018-03-06 NOTE — Assessment & Plan Note (Signed)
Patient presents today with acute cough and chest congestion.  Does have a history of chronic cough but states that her cough has been productive of green sputum.  Will treat for URI.  Patient Instructions  Will order doxycycline Will order prednisone taper Continue stiolto Continue albuterol as needed May take mucinex twice daily Follow up in 1 week with me or sooner if needed

## 2018-03-22 DIAGNOSIS — J449 Chronic obstructive pulmonary disease, unspecified: Secondary | ICD-10-CM | POA: Diagnosis not present

## 2018-03-26 ENCOUNTER — Other Ambulatory Visit: Payer: Self-pay | Admitting: Gastroenterology

## 2018-03-26 DIAGNOSIS — Z7984 Long term (current) use of oral hypoglycemic drugs: Secondary | ICD-10-CM | POA: Diagnosis not present

## 2018-03-26 DIAGNOSIS — K921 Melena: Secondary | ICD-10-CM | POA: Diagnosis not present

## 2018-03-26 DIAGNOSIS — I10 Essential (primary) hypertension: Secondary | ICD-10-CM | POA: Diagnosis not present

## 2018-03-26 DIAGNOSIS — E669 Obesity, unspecified: Secondary | ICD-10-CM | POA: Diagnosis not present

## 2018-03-26 DIAGNOSIS — D509 Iron deficiency anemia, unspecified: Secondary | ICD-10-CM | POA: Diagnosis not present

## 2018-03-26 DIAGNOSIS — Z9981 Dependence on supplemental oxygen: Secondary | ICD-10-CM | POA: Diagnosis not present

## 2018-03-26 DIAGNOSIS — J449 Chronic obstructive pulmonary disease, unspecified: Secondary | ICD-10-CM | POA: Diagnosis not present

## 2018-03-26 DIAGNOSIS — E1121 Type 2 diabetes mellitus with diabetic nephropathy: Secondary | ICD-10-CM | POA: Diagnosis not present

## 2018-03-27 ENCOUNTER — Other Ambulatory Visit: Payer: Self-pay

## 2018-03-27 NOTE — Patient Outreach (Signed)
  [facility]) Care Management Chronic Special Needs Program  03/27/2018  Name: [name] [name] DOB: May 11, 1945  MRN: [id number]  Ms. [name] is enrolled in a chronic special needs plan for Diabetes. Chronic Care Management Coordinator telephoned client to review health risk assessment and to develop individualized care plan.  Introduced the chronic care management program, importance of client participation, and taking their care plan to all provider appointments and inpatient facilities.  Reviewed the transition of care process and possible referral to community care management.  Subjective: Client states that her hemoglobin A1C was in the 4's when her doctor checked a few months ago.  States she cut back her diabetes medication to only one Metformin pill a day.  States that her blood sugars range from 100-131 in the morning.  States that her breathing has been better since she got the iron injections and the vitamin D.  States she is getting her inhalers for free now  Goals Addressed            This Visit's Progress   . Client understands the importance of follow-up with providers by attending scheduled visits      . Client will report no worsening of symptoms related to heart disease within the next 6 months       . Client will use Assistive Devices as needed and verbalize understanding of device use      . Client will verbalize knowledge of chronic lung disease as evidenced by no ED visits or Inpatient stays related to chronic lung disease       . Client will verbalize knowledge of self management of Hypertension as evidences by BP reading of 140/90 or less; or as defined by provider      . HEMOGLOBIN A1C < 7.0       Diabetes self management actions:  Glucose monitoring per provider recommendations  Perform Quality checks on blood meter  Eat Healthy  Check feet daily  Visit provider every 3-6 months as directed  Hbg A1C level every 3-6 months.  Eye  Exam yearly    . Maintain timely refills of diabetic medication as prescribed within the year .      Marland Kitchen Obtain annual  Lipid Profile, LDL-C      . Obtain Annual Eye (retinal)  Exam       . Obtain Annual Foot Exam      . Obtain annual screen for micro albuminuria (urine) , nephropathy (kidney problems)      . Obtain Hemoglobin A1C at least 2 times per year      . Visit Primary Care Provider or Endocrinologist at least 2 times per year         Client is not meeting diabetes self management goal of hemoglobin A1C of <7% with last reading of 4.4%.  Client has had medication review with pharmacy and is currently working with pharmacy assistance to get assistance with her inhalers Plan:  Send successful outreach letter with a copy of their individualized care plan, Send individual care plan to provider and Send educational material  Chronic care management coordination will outreach in:  6 Months     Dudley Major RN, [employer], CDE Chronic Care Management Coordinator [facility] Care Management [phone]

## 2018-03-28 ENCOUNTER — Ambulatory Visit (INDEPENDENT_AMBULATORY_CARE_PROVIDER_SITE_OTHER): Payer: HMO | Admitting: Emergency Medicine

## 2018-03-28 ENCOUNTER — Encounter: Payer: Self-pay | Admitting: Pharmacist

## 2018-03-28 ENCOUNTER — Encounter: Payer: Self-pay | Admitting: Emergency Medicine

## 2018-03-28 ENCOUNTER — Other Ambulatory Visit: Payer: Self-pay | Admitting: Pharmacy Technician

## 2018-03-28 ENCOUNTER — Other Ambulatory Visit: Payer: Self-pay

## 2018-03-28 DIAGNOSIS — J383 Other diseases of vocal cords: Secondary | ICD-10-CM | POA: Diagnosis not present

## 2018-03-28 DIAGNOSIS — J449 Chronic obstructive pulmonary disease, unspecified: Secondary | ICD-10-CM | POA: Diagnosis not present

## 2018-03-28 DIAGNOSIS — J301 Allergic rhinitis due to pollen: Secondary | ICD-10-CM

## 2018-03-28 MED ORDER — MONTELUKAST SODIUM 10 MG PO TABS: 10.0000 mg | ORAL_TABLET | Freq: Every day | ORAL | 11 refills | Status: DC

## 2018-03-28 NOTE — Progress Notes (Signed)
Subjective:   Patient ID: [name] [name], female    DOB: 1945/04/24, 73 y.o.   MRN: [id number]  HPI [name] [name] is a 73 year old with moderate COPD. Upper airway irritation syndrome and chronic cough. Allergic rhinitis.      ROV 12/27/17 --73 year old woman with moderate COPD, upper airway irritation syndrome with chronic cough that is exacerbated by allergic rhinitis and GERD.  She has frequent flares and has been seen twice in our office since our last visit, treated with antibiotics and then antibiotics and steroids.  She was treated with another course of steroids by her PCP beginning of this month, symptoms included cough and dyspnea.  New dx of B12 deficiency, had some recent falls in setting dizziness.   She tells me that her breathing is better, has some hoarse voice, cough has resolve. She hasn't been using flonase reliably, allegra, needs a new script. She is on omeprazole, now bid. She prefers stiolto to spiriva, wants to continue it. Uses albuterol rarely.                 ROV 03/28/2018 --this is a follow-up visit for [name] [name] who is 73, has moderate COPD, obesity, chronic cough with upper airway irritation syndrome that is been bothered by allergies, GERD.  She had a flare of her cough in late February that was treated with a prednisone taper and doxycycline.  She remains on omeprazole twice daily, Allegra, fluticasone nasal spray. She does [place] occasionally.  She is having intermittent cough and UA irritation, non-productive. Using mucinex and guaifenesin.       Objective:   Vitals:   03/28/18 0943  BP: 132/72  Pulse: (!) 116  SpO2: 95%  Weight: 148 lb (67.1 kg)  Height: 5' (1.524 m)    Gen: Pleasant, obese, in no distress,  normal affect  ENT: No lesions,  mouth clear,  oropharynx clear, no postnasal drip, mild hoarseness  Neck: No JVD, no UA noise currently  Lungs: No use of accessory muscles, distant, no wheeze but some coarse noise on forced exp  Cardiovascular:  RRR, heart sounds normal, no murmur or gallops, no peripheral edema  Musculoskeletal: No deformities, no cyanosis or clubbing  Neuro: alert, non focal  Skin: Warm, no lesions or rashes   Assessment & Plan:  COPD (chronic obstructive pulmonary disease) (HCC) Her recent exacerbation was more of an upper airway irritation phenomenon then true bronchospasm based on her description.  She did respond well to doxycycline and prednisone.  I think for now we can continue her Stiolto, she rarely uses her albuterol.  Continue try to manage her exacerbating factors aggressively  Allergic rhinitis Continue Allegra, fluticasone nasal spray as you have been taking them. Is time for you to restart your daily nasal saline rinses.  Continue this through the spring allergy season. We will add Singulair 10 mg each evening. Okay to continue your Mucinex and guaifenesin as you have been using them.  Vocal cord dysfunction Need to control her allergic rhinitis and GERD as aggressively as possible.  I like to add Singulair to her current regimen, make sure she restart her nasal saline rinses  Please continue omeprazole twice daily as you have been taking it. Continue Allegra, fluticasone nasal spray as you have been taking them. Is time for you to restart your daily nasal saline rinses.  Continue this through the spring allergy season. We will add Singulair 10 mg each evening. Okay to continue your Mucinex and guaifenesin as you  have been using them. Follow with Dr Delton Coombes in 4 months or sooner if you have any problems.    [name], MD, PhD 03/28/2018, 10:15 AM [facility] [phone] or if no answer [phone]

## 2018-03-28 NOTE — Assessment & Plan Note (Signed)
Need to control her allergic rhinitis and GERD as aggressively as possible.  I like to add Singulair to her current regimen, make sure she restart her nasal saline rinses  Please continue omeprazole twice daily as you have been taking it. Continue Allegra, fluticasone nasal spray as you have been taking them. Is time for you to restart your daily nasal saline rinses.  Continue this through the spring allergy season. We will add Singulair 10 mg each evening. Okay to continue your Mucinex and guaifenesin as you have been using them. Follow with Dr Delton Coombes in 4 months or sooner if you have any problems.

## 2018-03-28 NOTE — Patient Instructions (Signed)
Please continue Stiolto 2 puffs once daily. Keep your albuterol available to use 2 puffs if needed for shortness of breath, chest tightness, wheezing. Please continue omeprazole twice daily as you have been taking it. Continue Allegra, fluticasone nasal spray as you have been taking them. Is time for you to restart your daily nasal saline rinses.  Continue this through the spring allergy season. We will add Singulair 10 mg each evening. Okay to continue your Mucinex and guaifenesin as you have been using them. Follow with Dr Delton Coombes in 4 months or sooner if you have any problems.

## 2018-03-28 NOTE — Assessment & Plan Note (Signed)
Continue Allegra, fluticasone nasal spray as you have been taking them. Is time for you to restart your daily nasal saline rinses.  Continue this through the spring allergy season. We will add Singulair 10 mg each evening. Okay to continue your Mucinex and guaifenesin as you have been using them.

## 2018-03-28 NOTE — Assessment & Plan Note (Signed)
Her recent exacerbation was more of an upper airway irritation phenomenon then true bronchospasm based on her description.  She did respond well to doxycycline and prednisone.  I think for now we can continue her Stiolto, she rarely uses her albuterol.  Continue try to manage her exacerbating factors aggressively

## 2018-03-28 NOTE — Patient Outreach (Signed)
[facility]) Care Management  03/28/2018  [name] [name] 17-Oct-1945 [id number]    Follow up call placed to [employer] regarding patient assistance application(s) for Stiolto Respimat , Kia confirms patient has been approved as of 3/3 until 01/09/2019. Medication delivered to patient on 3/11.   Follow up call placed to [employer] regarding patient assistance application(s) for Proventil HFA , Molly Maduro confirms patient has been approved as of 2/26 until 01/09/2019. Medication delivered to patient on 3/14.   Successful call placed to patient regarding patient assistance medication receipt from [employer] and [employer], HIPAA identifiers verified. [name] confirms she received both medications in the mail. Reviewed with patient how to obtain refills from both companies. Patient states she currently doesn't have any additional questions, she has been receiving samples. Requested that she contact me if she runs into any issues in the future.   Follow up:  Will route note to [facility] RPh Nunzio Cobbs for case closure.  [name] [name] CPhT Certified Pharmacy Technician [facility] Care Management Direct [phone]

## 2018-04-10 ENCOUNTER — Inpatient Hospital Stay: Admission: RE | Admit: 2018-04-10 | Payer: HMO | Source: Ambulatory Visit

## 2018-04-18 ENCOUNTER — Telehealth: Payer: Self-pay | Admitting: Hematology and Oncology

## 2018-04-18 NOTE — Telephone Encounter (Signed)
Per 4/7 schedule message moved April appointments to end of May. Confirmed with patient.

## 2018-04-22 ENCOUNTER — Other Ambulatory Visit: Payer: PPO

## 2018-04-24 ENCOUNTER — Ambulatory Visit: Payer: PPO | Admitting: Hematology and Oncology

## 2018-05-31 ENCOUNTER — Ambulatory Visit
Admission: RE | Admit: 2018-05-31 | Discharge: 2018-05-31 | Disposition: A | Discharge status: Home / Self Care | Payer: HMO | Source: Ambulatory Visit | Attending: Gastroenterology | Admitting: Gastroenterology

## 2018-05-31 ENCOUNTER — Other Ambulatory Visit: Payer: Self-pay | Admitting: Gastroenterology

## 2018-05-31 DIAGNOSIS — K921 Melena: Secondary | ICD-10-CM

## 2018-06-01 ENCOUNTER — Other Ambulatory Visit: Payer: Self-pay | Admitting: Emergency Medicine

## 2018-06-04 NOTE — Assessment & Plan Note (Signed)
With negative fecal stool Hemoccults detected when she went to the emergency room January 01, 2018 with hypertension Lab review: 01/14/2018:  Current treatment: Oral iron supplementation. IV iron January 2020 Currently on Xarelto. GI evaluation: Dr. Randa Evens  Return to clinic in 3 months with labs and follow-up labs to be done ahead of time

## 2018-06-05 ENCOUNTER — Other Ambulatory Visit: Payer: Self-pay

## 2018-06-05 ENCOUNTER — Inpatient Hospital Stay: Payer: HMO | Attending: Hematology and Oncology

## 2018-06-05 DIAGNOSIS — Z7951 Long term (current) use of inhaled steroids: Secondary | ICD-10-CM | POA: Insufficient documentation

## 2018-06-05 DIAGNOSIS — Z86711 Personal history of pulmonary embolism: Secondary | ICD-10-CM | POA: Diagnosis not present

## 2018-06-05 DIAGNOSIS — Z7982 Long term (current) use of aspirin: Secondary | ICD-10-CM | POA: Diagnosis not present

## 2018-06-05 DIAGNOSIS — Z7901 Long term (current) use of anticoagulants: Secondary | ICD-10-CM | POA: Insufficient documentation

## 2018-06-05 DIAGNOSIS — Z79899 Other long term (current) drug therapy: Secondary | ICD-10-CM | POA: Diagnosis not present

## 2018-06-05 DIAGNOSIS — K219 Gastro-esophageal reflux disease without esophagitis: Secondary | ICD-10-CM | POA: Insufficient documentation

## 2018-06-05 DIAGNOSIS — I10 Essential (primary) hypertension: Secondary | ICD-10-CM | POA: Diagnosis not present

## 2018-06-05 DIAGNOSIS — D509 Iron deficiency anemia, unspecified: Secondary | ICD-10-CM | POA: Diagnosis not present

## 2018-06-05 DIAGNOSIS — Z7984 Long term (current) use of oral hypoglycemic drugs: Secondary | ICD-10-CM | POA: Insufficient documentation

## 2018-06-05 DIAGNOSIS — E119 Type 2 diabetes mellitus without complications: Secondary | ICD-10-CM | POA: Insufficient documentation

## 2018-06-05 LAB — CBC WITH DIFFERENTIAL (CANCER CENTER ONLY)
Abs Immature Granulocytes: 0.02 10*3/uL (ref 0.00–0.07)
Basophils Absolute: 0.1 10*3/uL (ref 0.0–0.1)
Basophils Relative: 1 %
Eosinophils Absolute: 0.4 10*3/uL (ref 0.0–0.5)
Eosinophils Relative: 4 %
HCT: 36.8 % (ref 36.0–46.0)
Hemoglobin: 11.3 g/dL — ABNORMAL LOW (ref 12.0–15.0)
Immature Granulocytes: 0 %
Lymphocytes Relative: 20 %
Lymphs Abs: 1.7 10*3/uL (ref 0.7–4.0)
MCH: 30.4 pg (ref 26.0–34.0)
MCHC: 30.7 g/dL (ref 30.0–36.0)
MCV: 98.9 fL (ref 80.0–100.0)
Monocytes Absolute: 0.8 10*3/uL (ref 0.1–1.0)
Monocytes Relative: 9 %
Neutro Abs: 5.5 10*3/uL (ref 1.7–7.7)
Neutrophils Relative %: 66 %
Platelet Count: 356 10*3/uL (ref 150–400)
RBC: 3.72 MIL/uL — ABNORMAL LOW (ref 3.87–5.11)
RDW: 13.5 % (ref 11.5–15.5)
WBC Count: 8.3 10*3/uL (ref 4.0–10.5)
nRBC: 0 % (ref 0.0–0.2)

## 2018-06-05 LAB — FERRITIN: Ferritin: 43 ng/mL (ref 11–307)

## 2018-06-05 LAB — IRON AND TIBC
Iron: 32 ug/dL — ABNORMAL LOW (ref 41–142)
Saturation Ratios: 9 % — ABNORMAL LOW (ref 21–57)
TIBC: 369 ug/dL (ref 236–444)
UIBC: 337 ug/dL (ref 120–384)

## 2018-06-06 NOTE — Progress Notes (Signed)
Patient Care Team: Shirlean Mylar, MD as PCP - General (Family Medicine) Delton Coombes Les Pou, MD (Pulmonary Disease) [name], [facility] (Pharmacist) Yetta Glassman, RN as [facility] Care Management  DIAGNOSIS:    ICD-10-CM   1. Iron deficiency anemia, unspecified iron deficiency anemia type D50.9     CHIEF COMPLIANT: Follow-up of iron deficiency anemia   INTERVAL HISTORY: [name] [name] is a 73 y.o. with above-mentioned history of iron deficiency anemia previously treated with IV iron, B12 deficiency, and pulmonary embolism on Xarelto. Labs on 06/05/18 showed: Hg 11.3, HCT 36.8, iron saturation 9%, ferritin 43. She presents to the clinic today to review recent labs.  She does not have the dizziness anymore.  She uses oxygen 24 x 7.  REVIEW OF SYSTEMS:   Constitutional: Denies fevers, chills or abnormal weight loss Eyes: Denies blurriness of vision Ears, nose, mouth, throat, and face: Denies mucositis or sore throat Respiratory: Shortness of breath requiring oxygen 24 x 7 Cardiovascular: Denies palpitation, chest discomfort Gastrointestinal: Denies nausea, heartburn or change in bowel habits Skin: Denies abnormal skin rashes Lymphatics: Denies new lymphadenopathy or easy bruising Neurological: Denies numbness, tingling or new weaknesses Behavioral/Psych: Mood is stable, no new changes  Extremities: No lower extremity edema Breast: denies any pain or lumps or nodules in either breasts All other systems were reviewed with the patient and are negative.  I have reviewed the past medical history, past surgical history, social history and family history with the patient and they are unchanged from previous note.  ALLERGIES:  is allergic to lisinopril and sulfonamide derivatives.  MEDICATIONS:  Current Outpatient Medications  Medication Sig Dispense Refill  . albuterol (PROAIR HFA) 108 (90 Base) MCG/ACT inhaler INHALE 2 PUFFS INTO THE LUNGS EVERY 6 (SIX) HOURS AS  NEEDED FOR WHEEZING OR SHORTNESS OF BREATH. (Patient taking differently: Inhale 2 puffs into the lungs every 6 (six) hours as needed for wheezing or shortness of breath. ) 3 Inhaler 1  . albuterol (PROVENTIL) (2.5 MG/3ML) 0.083% nebulizer solution Take 3 mLs (2.5 mg total) by nebulization every 4 (four) hours as needed for wheezing or shortness of breath (DX: COPD 496). 150 mL 12  . Ascorbic Acid (VITAMIN C) 1000 MG tablet Take 1,000 mg by mouth daily.      Marland Kitchen aspirin 81 MG chewable tablet Chew 81 mg by mouth at bedtime.     Marland Kitchen atenolol (TENORMIN) 50 MG tablet Take 50 mg by mouth daily.      Marland Kitchen atorvastatin (LIPITOR) 20 MG tablet Take 20 mg by mouth daily.      . Calcium Carbonate-Vitamin D (CALTRATE 600+D) 600-400 MG-UNIT per tablet Take 2 tablets by mouth daily.      . cephALEXin (KEFLEX) 500 MG capsule Take 1 capsule (500 mg total) by mouth 3 (three) times daily. 21 capsule 0  . doxycycline (VIBRA-TABS) 100 MG tablet Take 1 tablet (100 mg total) by mouth 2 (two) times daily. 14 tablet 0  . ferrous sulfate 325 (65 FE) MG tablet Take 1 tablet (325 mg total) by mouth daily. 30 tablet 0  . fexofenadine (ALLEGRA) 180 MG tablet Take 180 mg by mouth daily.    . fluticasone (FLONASE) 50 MCG/ACT nasal spray Place 2 sprays into both nostrils daily. 16 g 5  . levothyroxine (SYNTHROID, LEVOTHROID) 100 MCG tablet Take 100 mcg by mouth daily.    Marland Kitchen losartan (COZAAR) 25 MG tablet Take 25 mg by mouth daily.    . metFORMIN (GLUCOPHAGE) 500 MG  tablet Take 500 mg by mouth 2 (two) times daily with a meal.     . montelukast (SINGULAIR) 10 MG tablet Take 1 tablet (10 mg total) by mouth at bedtime. 30 tablet 11  . Multiple Vitamins-Minerals (CENTRUM PO) Take 1 tablet by mouth daily.      . naproxen sodium (ALEVE) 220 MG tablet Take 220 mg by mouth daily as needed (pain).    . nitroGLYCERIN (NITROSTAT) 0.4 MG SL tablet Place 1 tablet (0.4 mg total) under the tongue every 5 (five) minutes as needed for chest pain. 25 tablet  4  . omeprazole (PRILOSEC) 20 MG capsule TAKE 1 CAPSULE (20 MG TOTAL) BY MOUTH 2 (TWO) TIMES DAILY BEFORE A MEAL. 30 capsule 5  . ONE TOUCH ULTRA TEST test strip As directed    . predniSONE (DELTASONE) 10 MG tablet Take 4 tabs for 2 days, then 3 tabs for 2 days, then 2 tabs for 2 days, then 1 tab for 2 days, then stop 20 tablet 0  . Tiotropium Bromide-Olodaterol (STIOLTO RESPIMAT) 2.5-2.5 MCG/ACT AERS Inhale 2 puffs into the lungs daily. 2 Inhaler 0  . traZODone (DESYREL) 50 MG tablet Take 50 mg by mouth at bedtime.     Marland Kitchen. venlafaxine XR (EFFEXOR-XR) 150 MG 24 hr capsule Take 150 mg by mouth daily.     [name]. XARELTO 20 MG TABS tablet Take 20 mg by mouth daily with supper.   5   No current facility-administered medications for this visit.     PHYSICAL EXAMINATION: ECOG PERFORMANCE STATUS: 1 - Symptomatic but completely ambulatory  Vitals:   06/07/18 1055  BP: 116/61  Pulse: (!) 117  Resp: 18  Temp: 98 F (36.7 C)  SpO2: 99%   Filed Weights   06/07/18 1055  Weight: 148 lb 4.8 oz (67.3 kg)    GENERAL: alert, no distress and comfortable SKIN: skin color, texture, turgor are normal, no rashes or significant lesions EYES: normal, Conjunctiva are pink and non-injected, sclera clear OROPHARYNX: no exudate, no erythema and lips, buccal mucosa, and tongue normal  NECK: supple, thyroid normal size, non-tender, without nodularity LYMPH: no palpable lymphadenopathy in the cervical, axillary or inguinal LUNGS: Chronic shortness of breath using oxygen HEART: regular rate & rhythm and no murmurs and no lower extremity edema ABDOMEN: abdomen soft, non-tender and normal bowel sounds MUSCULOSKELETAL: no cyanosis of digits and no clubbing  NEURO: alert & oriented x 3 with fluent speech, no focal motor/sensory deficits EXTREMITIES: No lower extremity edema  LABORATORY DATA:  I have reviewed the data as listed CMP Latest Ref Rng & Units 01/01/2018 07/30/2017 07/26/2017  Glucose 70 - 99 mg/dL 161(W112(H)  960(A146(H) 540(J116(H)  BUN 8 - 23 mg/dL 22 8 11   Creatinine 0.44 - 1.00 mg/dL 8.11(B1.27(H) 1.470.94 8.29(F1.02(H)  Sodium 135 - 145 mmol/L 138 140 140  Potassium 3.5 - 5.1 mmol/L 4.1 4.0 4.5  Chloride 98 - 111 mmol/L 106 101 102  CO2 22 - 32 mmol/L 23 30 28   Calcium 8.9 - 10.3 mg/dL 6.2(Z8.7(L) 9.2 8.9  Total Protein 6.5 - 8.1 g/dL 5.9(L) - -  Total Bilirubin 0.3 - 1.2 mg/dL 0.6 - -  Alkaline Phos 38 - 126 U/L 49 - -  AST 15 - 41 U/L 17 - -  ALT 0 - 44 U/L 11 - -    Lab Results  Component Value Date   WBC 8.3 06/05/2018   HGB 11.3 (L) 06/05/2018   HCT 36.8 06/05/2018   MCV 98.9 06/05/2018  PLT 356 06/05/2018   NEUTROABS 5.5 06/05/2018    ASSESSMENT & PLAN:  Iron deficiency anemia With negative fecal stool Hemoccults detected when she went to the emergency room January 01, 2018 with hypertension Lab review: 06/05/2018: Hemoglobin 11.3, ferritin 44, iron saturation 9% Prior treatment: IV iron January 2020 Current treatment: Oral iron supplementation.  Currently on Xarelto for PE. GI evaluation: Dr. Randa Evens  I reviewed the blood work with the patient and she does not need any immediate IV iron treatment.  Return to clinic in 3 months with labs and follow-up labs to be done ahead of time.  We will do a Doximity video visit for follow-up.    No orders of the defined types were placed in this encounter.  The patient has a good understanding of the overall plan. she agrees with it. she will call with any problems that may develop before the next visit here.  Serena Croissant, MD 06/07/2018  Jenness Corner Dorshimer am acting as scribe for Dr. Serena Croissant.  I have reviewed the above documentation for accuracy and completeness, and I agree with the above.

## 2018-06-07 ENCOUNTER — Other Ambulatory Visit: Payer: Self-pay

## 2018-06-07 ENCOUNTER — Inpatient Hospital Stay (HOSPITAL_BASED_OUTPATIENT_CLINIC_OR_DEPARTMENT_OTHER): Payer: HMO | Admitting: Hematology and Oncology

## 2018-06-07 DIAGNOSIS — Z86711 Personal history of pulmonary embolism: Secondary | ICD-10-CM | POA: Diagnosis not present

## 2018-06-07 DIAGNOSIS — I10 Essential (primary) hypertension: Secondary | ICD-10-CM

## 2018-06-07 DIAGNOSIS — Z79899 Other long term (current) drug therapy: Secondary | ICD-10-CM | POA: Diagnosis not present

## 2018-06-07 DIAGNOSIS — K219 Gastro-esophageal reflux disease without esophagitis: Secondary | ICD-10-CM

## 2018-06-07 DIAGNOSIS — E119 Type 2 diabetes mellitus without complications: Secondary | ICD-10-CM | POA: Diagnosis not present

## 2018-06-07 DIAGNOSIS — Z7984 Long term (current) use of oral hypoglycemic drugs: Secondary | ICD-10-CM

## 2018-06-07 DIAGNOSIS — Z7901 Long term (current) use of anticoagulants: Secondary | ICD-10-CM

## 2018-06-07 DIAGNOSIS — D509 Iron deficiency anemia, unspecified: Secondary | ICD-10-CM

## 2018-06-07 DIAGNOSIS — Z7982 Long term (current) use of aspirin: Secondary | ICD-10-CM | POA: Diagnosis not present

## 2018-06-07 DIAGNOSIS — Z7951 Long term (current) use of inhaled steroids: Secondary | ICD-10-CM | POA: Diagnosis not present

## 2018-06-10 ENCOUNTER — Telehealth: Payer: Self-pay | Admitting: Hematology and Oncology

## 2018-06-10 NOTE — Telephone Encounter (Signed)
Called regarding schedule °

## 2018-06-19 DIAGNOSIS — K5730 Diverticulosis of large intestine without perforation or abscess without bleeding: Secondary | ICD-10-CM | POA: Diagnosis not present

## 2018-06-19 DIAGNOSIS — R195 Other fecal abnormalities: Secondary | ICD-10-CM | POA: Diagnosis not present

## 2018-06-19 DIAGNOSIS — K640 First degree hemorrhoids: Secondary | ICD-10-CM | POA: Diagnosis not present

## 2018-06-22 DIAGNOSIS — J449 Chronic obstructive pulmonary disease, unspecified: Secondary | ICD-10-CM | POA: Diagnosis not present

## 2018-06-26 ENCOUNTER — Telehealth: Payer: Self-pay | Admitting: Emergency Medicine

## 2018-06-26 MED ORDER — PREDNISONE 10 MG PO TABS: ORAL_TABLET | ORAL | 0 refills | Status: DC

## 2018-06-26 NOTE — Telephone Encounter (Signed)
Primary Pulmonologist: RB Last office visit and with whom: 03/28/2018 with RB What do we see them for (pulmonary problems): COPD Last OV assessment/plan: Instructions  Please continue Stiolto 2 puffs once daily. Keep your albuterol available to use 2 puffs if needed for shortness of breath, chest tightness, wheezing. Please continue omeprazole twice daily as you have been taking it. Continue Allegra, fluticasone nasal spray as you have been taking them. Is time for you to restart your daily nasal saline rinses.  Continue this through the spring allergy season. We will add Singulair 10 mg each evening. Okay to continue your Mucinex and guaifenesin as you have been using them. Follow with Dr Lamonte Sakai in 4 months or sooner if you have any problems.      Was appointment offered to patient (explain)?  Pt is requesting meds to be called in   Reason for call: Called and spoke with pt who stated she began having increased SOB and cough with yellow phlegm which began 2 days ago. Pt also has complaints of wheezing.  Pt denies any complaints of fever, chest tightness, or body aches.  Pt states that she has had some chills.  Pt has had to use albuterol inhaler 2-3 times daily and also has been using nebulizer twice daily. Pt also has been using netti pot. Pt is taking stiolto as prescribed.  Pt is requesting to have prednisone sent to pharmacy and also wants other recommendations. Tonya, please advise on this for pt. Thanks!

## 2018-06-26 NOTE — Telephone Encounter (Signed)
Returned call to patient and advised prednisone taper has been sent in and if she is not improving to please contact us to schedule a televisit or report to the ED if condition worsens.  Patient acknowledged understanding and had no further questions.  Encounter closed.

## 2018-06-26 NOTE — Telephone Encounter (Signed)
I ordered a prednisone taper. If patient does not improve she will need tele-visit or go to the ED. Thanks.

## 2018-07-03 ENCOUNTER — Ambulatory Visit: Payer: Self-pay | Admitting: Pharmacist

## 2018-07-03 ENCOUNTER — Other Ambulatory Visit: Payer: Self-pay | Admitting: Pharmacist

## 2018-07-03 NOTE — Patient Outreach (Signed)
[facility]) Care Management  07/03/2018  [name] [name] 1945-04-15 [id number]   Patient was called for CSNP follow up. Unfortunately, she did not answer the phone. HIPAA compliant message was left on her voicemail.   Patient is receiving Spiriva and Proventil HFA from patient assistance programs.  Plan: Call patient back in 2-3 months.  Elayne Guerin, PharmD, [place] Clinical Pharmacist [phone]

## 2018-07-22 DIAGNOSIS — J449 Chronic obstructive pulmonary disease, unspecified: Secondary | ICD-10-CM | POA: Diagnosis not present

## 2018-07-29 ENCOUNTER — Other Ambulatory Visit: Payer: Self-pay | Admitting: Emergency Medicine

## 2018-07-30 ENCOUNTER — Other Ambulatory Visit: Payer: Self-pay

## 2018-07-30 ENCOUNTER — Encounter: Payer: Self-pay | Admitting: Emergency Medicine

## 2018-07-30 ENCOUNTER — Ambulatory Visit: Payer: HMO | Admitting: Emergency Medicine

## 2018-07-30 DIAGNOSIS — J301 Allergic rhinitis due to pollen: Secondary | ICD-10-CM

## 2018-07-30 DIAGNOSIS — J383 Other diseases of vocal cords: Secondary | ICD-10-CM

## 2018-07-30 DIAGNOSIS — J9611 Chronic respiratory failure with hypoxia: Secondary | ICD-10-CM | POA: Diagnosis not present

## 2018-07-30 DIAGNOSIS — J449 Chronic obstructive pulmonary disease, unspecified: Secondary | ICD-10-CM

## 2018-07-30 MED ORDER — PREDNISONE 10 MG PO TABS: ORAL_TABLET | ORAL | 0 refills | Status: DC

## 2018-07-30 NOTE — Assessment & Plan Note (Signed)
Continue Singulair 10 mg each evening Continue fluticasone nasal spray (Flonase) 2 sprays each nostril once daily. Continue Allegra as you have been taking it You should consider restarting your nasal saline rinses every day during the fall and spring months.  You can also use these for flaring symptoms, increased congestion at other times.

## 2018-07-30 NOTE — Assessment & Plan Note (Signed)
Please continue Stiolto 2 puffs once daily as you have been taking it. Keep your albuterol available to use either 1 nebulizer treatment or 2 puffs up to every 4 hours if needed for shortness of breath, chest tightness, wheezing. We will provide you with a prescription for prednisone to take with you as you travel.  Please call [place] if you have to start this medication.

## 2018-07-30 NOTE — Patient Instructions (Signed)
Please continue Stiolto 2 puffs once daily as you have been taking it. Keep your albuterol available to use either 1 nebulizer treatment or 2 puffs up to every 4 hours if needed for shortness of breath, chest tightness, wheezing. We will provide you with a prescription for prednisone to take with you as you travel.  Please call [place] if you have to start this medication. Continue your oxygen at 2 to 3 L/min with exertion. Continue Singulair 10 mg each evening Continue fluticasone nasal spray (Flonase) 2 sprays each nostril once daily. Continue Allegra as you have been taking it You should consider restarting your nasal saline rinses every day during the fall and spring months.  You can also use these for flaring symptoms, increased congestion at other times.  Continue omeprazole as you have been taking it Follow with Dr Lamonte Sakai in 3 - 4 months or sooner if you have any problems.

## 2018-07-30 NOTE — Progress Notes (Signed)
Subjective:   Patient ID: [name] BarrenPatricia A [name], female    DOB: 08/03/1945, 73 y.o.   MRN: [id number]  HPI [name] [name] is a 73 year old with moderate COPD. Upper airway irritation syndrome and chronic cough. Allergic rhinitis.      ROV 12/27/17 --73 year old woman with moderate COPD, upper airway irritation syndrome with chronic cough that is exacerbated by allergic rhinitis and GERD.  She has frequent flares and has been seen twice in our office since our last visit, treated with antibiotics and then antibiotics and steroids.  She was treated with another course of steroids by her PCP beginning of this month, symptoms included cough and dyspnea.  New dx of B12 deficiency, had some recent falls in setting dizziness.   She tells me that her breathing is better, has some hoarse voice, cough has resolve. She hasn't been using flonase reliably, allegra, needs a new script. She is on omeprazole, now bid. She prefers stiolto to spiriva, wants to continue it. Uses albuterol rarely.                 ROV 03/28/2018 --this is a follow-up visit for [name] [name] who is 89, has moderate COPD, obesity, chronic cough with upper airway irritation syndrome that is been bothered by allergies, GERD.  She had a flare of her cough in late February that was treated with a prednisone taper and doxycycline.  She remains on omeprazole twice daily, Allegra, fluticasone nasal spray. She does [place] occasionally.  She is having intermittent cough and UA irritation, non-productive. Using mucinex and guaifenesin.     ROV 07/30/2018 --73 year old woman with obesity, moderate COPD, chronic cough with irritable upper airway syndrome in the setting of GERD, allergic rhinitis. She is using 2-3L/min pulsed on POC.  She is been experiencing increased dyspnea and cough with exertion, more notable during the hot months.  She is using her albuterol about 2-3 times a day, sometimes nebulized in the morning and then HFA through the day.  Currently on  Stiolto once daily, Singulair, Flonase, Allegra, omeprazole. She does [place] prn.  Her most recent flare, corticosteroids taper was in June. Dry cough at that time. She is going to [place] next week, wants to take prednisone with her on reserve.     Objective:   Vitals:   07/30/18 0946  BP: 120/68  Pulse: (!) 104  Temp: 98 F (36.7 C)  TempSrc: Oral  SpO2: 98%  Weight: 150 lb 6.4 oz (68.2 kg)  Height: 5' (1.524 m)    Gen: Pleasant, obese, in no distress,  normal affect  ENT: No lesions,  mouth clear,  oropharynx clear, no postnasal drip, strong voice today  Neck: No JVD, no UA noise  Lungs: No use of accessory muscles, distant, no wheeze today  Cardiovascular: RRR, heart sounds normal, no murmur or gallops, no peripheral edema  Musculoskeletal: No deformities, no cyanosis or clubbing  Neuro: alert, non focal  Skin: Warm, no lesions or rashes   Assessment & Plan:  COPD (chronic obstructive pulmonary disease) (HCC) Please continue Stiolto 2 puffs once daily as you have been taking it. Keep your albuterol available to use either 1 nebulizer treatment or 2 puffs up to every 4 hours if needed for shortness of breath, chest tightness, wheezing. We will provide you with a prescription for prednisone to take with you as you travel.  Please call [place] if you have to start this medication.   Allergic rhinitis Continue Singulair 10 mg each evening Continue fluticasone  nasal spray (Flonase) 2 sprays each nostril once daily. Continue Allegra as you have been taking it You should consider restarting your nasal saline rinses every day during the fall and spring months.  You can also use these for flaring symptoms, increased congestion at other times.    Vocal cord dysfunction Aggressively treat GERD, allergic rhinitis.  Intermittently she does require prednisone  Chronic respiratory failure with hypoxia (HCC) Continue oxygen at 2 to 3 L/min by pulsed system with exertion.    Baltazar Apo, MD, PhD 07/30/2018, 10:17 AM [facility] [phone] or if no answer [phone]

## 2018-07-30 NOTE — Assessment & Plan Note (Signed)
Continue oxygen at 2 to 3 L/min by pulsed system with exertion.

## 2018-07-30 NOTE — Addendum Note (Signed)
Addended by: Hildred Alamin I on: 07/30/2018 05:26 PM   Modules accepted: Orders

## 2018-07-30 NOTE — Assessment & Plan Note (Signed)
Aggressively treat GERD, allergic rhinitis.  Intermittently she does require prednisone

## 2018-08-22 DIAGNOSIS — J449 Chronic obstructive pulmonary disease, unspecified: Secondary | ICD-10-CM | POA: Diagnosis not present

## 2018-08-30 NOTE — Assessment & Plan Note (Signed)
With negative fecal stool Hemoccultsdetected when she went to the emergency room January 01, 2018 with hypertension  Prior treatment: IV iron January 2020 Current treatment: Oral iron supplementation.  Lab review:  06/05/2018: Hemoglobin 11.3, ferritin 44, iron saturation 9% 09/04/2018:  Currently on Xarelto for PE. GI evaluation: Dr. Oletta Lamas  I reviewed the blood work with the patient and she does not need any immediate IV iron treatment.  Return to clinic in 3 months with labs and follow-up labs to be done ahead of time.

## 2018-09-04 ENCOUNTER — Inpatient Hospital Stay: Payer: HMO | Attending: Hematology and Oncology

## 2018-09-04 ENCOUNTER — Other Ambulatory Visit: Payer: Self-pay

## 2018-09-04 DIAGNOSIS — D509 Iron deficiency anemia, unspecified: Secondary | ICD-10-CM | POA: Diagnosis not present

## 2018-09-04 LAB — CBC WITH DIFFERENTIAL (CANCER CENTER ONLY)
Abs Immature Granulocytes: 0.01 10*3/uL (ref 0.00–0.07)
Basophils Absolute: 0 10*3/uL (ref 0.0–0.1)
Basophils Relative: 1 %
Eosinophils Absolute: 0.4 10*3/uL (ref 0.0–0.5)
Eosinophils Relative: 6 %
HCT: 37.2 % (ref 36.0–46.0)
Hemoglobin: 11.6 g/dL — ABNORMAL LOW (ref 12.0–15.0)
Immature Granulocytes: 0 %
Lymphocytes Relative: 21 %
Lymphs Abs: 1.2 10*3/uL (ref 0.7–4.0)
MCH: 29.2 pg (ref 26.0–34.0)
MCHC: 31.2 g/dL (ref 30.0–36.0)
MCV: 93.7 fL (ref 80.0–100.0)
Monocytes Absolute: 0.4 10*3/uL (ref 0.1–1.0)
Monocytes Relative: 6 %
Neutro Abs: 4 10*3/uL (ref 1.7–7.7)
Neutrophils Relative %: 66 %
Platelet Count: 277 10*3/uL (ref 150–400)
RBC: 3.97 MIL/uL (ref 3.87–5.11)
RDW: 13.2 % (ref 11.5–15.5)
WBC Count: 6 10*3/uL (ref 4.0–10.5)
nRBC: 0 % (ref 0.0–0.2)

## 2018-09-04 LAB — IRON AND TIBC
Iron: 48 ug/dL (ref 41–142)
Saturation Ratios: 15 % — ABNORMAL LOW (ref 21–57)
TIBC: 319 ug/dL (ref 236–444)
UIBC: 271 ug/dL (ref 120–384)

## 2018-09-04 LAB — FERRITIN: Ferritin: 55 ng/mL (ref 11–307)

## 2018-09-05 NOTE — Progress Notes (Signed)
HEMATOLOGY-ONCOLOGY DOXIMITY VISIT PROGRESS NOTE  I connected with Gerald Stabs on 09/06/2018 at  9:30 AM EDT by Doximity video conference and verified that I am speaking with the correct person using two identifiers.  I discussed the limitations, risks, security and privacy concerns of performing an evaluation and management service by Doximity and the availability of in person appointments.  I also discussed with the patient that there may be a patient responsible charge related to this service. The patient expressed understanding and agreed to proceed.  Patient's Location: Home Physician Location: Clinic  CHIEF COMPLIANT: Follow-up of iron deficiency anemia  INTERVAL HISTORY: [name] [name] is a 73 y.o. female with above-mentioned history of iron deficiency anemia previously treated with IV iron, B12 deficiency, and pulmonary embolism on Xarelto. Labs on 09/04/18 showed: Hg 11.6, HCT 37.2, MCV 93.7, iron saturation 15%, ferritin 55. She presents over Doximity today for 3 month follow-up to review her recent labs.   REVIEW OF SYSTEMS:   Constitutional: Denies fevers, chills or abnormal weight loss Eyes: Denies blurriness of vision Ears, nose, mouth, throat, and face: Denies mucositis or sore throat Respiratory: Denies cough, dyspnea or wheezes Cardiovascular: Denies palpitation, chest discomfort Gastrointestinal:  Denies nausea, heartburn or change in bowel habits Skin: Denies abnormal skin rashes Lymphatics: Denies new lymphadenopathy or easy bruising Neurological:Denies numbness, tingling or new weaknesses Behavioral/Psych: Mood is stable, no new changes  Extremities: No lower extremity edema Breast: denies any pain or lumps or nodules in either breasts All other systems were reviewed with the patient and are negative.  Observations/Objective:  There were no vitals filed for this visit. There is no height or weight on file to calculate BMI.  I have reviewed the data as  listed CMP Latest Ref Rng & Units 01/01/2018 07/30/2017 07/26/2017  Glucose 70 - 99 mg/dL 112(H) 146(H) 116(H)  BUN 8 - 23 mg/dL 22 8 11   Creatinine 0.44 - 1.00 mg/dL 1.27(H) 0.94 1.02(H)  Sodium 135 - 145 mmol/L 138 140 140  Potassium 3.5 - 5.1 mmol/L 4.1 4.0 4.5  Chloride 98 - 111 mmol/L 106 101 102  CO2 22 - 32 mmol/L 23 30 28   Calcium 8.9 - 10.3 mg/dL 8.7(L) 9.2 8.9  Total Protein 6.5 - 8.1 g/dL 5.9(L) - -  Total Bilirubin 0.3 - 1.2 mg/dL 0.6 - -  Alkaline Phos 38 - 126 U/L 49 - -  AST 15 - 41 U/L 17 - -  ALT 0 - 44 U/L 11 - -    Lab Results  Component Value Date   WBC 6.0 09/04/2018   HGB 11.6 (L) 09/04/2018   HCT 37.2 09/04/2018   MCV 93.7 09/04/2018   PLT 277 09/04/2018   NEUTROABS 4.0 09/04/2018      Assessment Plan:  Iron deficiency anemia With negative fecal stool Hemoccultsdetected when she went to the emergency room January 01, 2018 with hypertension  Prior treatment: IV iron January 2020 Current treatment: Oral iron supplementation.  Lab review:  06/05/2018: Hemoglobin 11.3, ferritin 44, iron saturation 9% 09/04/2018: Hemoglobin 11.6, MCV 93.7, iron saturation 15%,Ferritin 55  Currently on Xarelto for PE. GI evaluation: Dr. Oletta Lamas  I reviewed the blood work with the patient and she does not need any immediate IV iron treatment.  Return to clinic in 3 months with labs and follow-up labs to be done ahead of time.   I discussed the assessment and treatment plan with the patient. The patient was provided an opportunity to ask questions and all were  answered. The patient agreed with the plan and demonstrated an understanding of the instructions. The patient was advised to call back or seek an in-person evaluation if the symptoms worsen or if the condition fails to improve as anticipated.   I provided 15 minutes of face-to-face Doximity time during this encounter.    Sabas SousVinay K Marchia Diguglielmo, MD 09/06/2018   I, Molly Dorshimer, am acting as scribe for Serena CroissantVinay Wen Merced,  MD.  I have reviewed the above documentation for accuracy and completeness, and I agree with the above.

## 2018-09-06 ENCOUNTER — Inpatient Hospital Stay (HOSPITAL_BASED_OUTPATIENT_CLINIC_OR_DEPARTMENT_OTHER): Payer: HMO | Admitting: Hematology and Oncology

## 2018-09-06 DIAGNOSIS — D509 Iron deficiency anemia, unspecified: Secondary | ICD-10-CM

## 2018-09-09 ENCOUNTER — Telehealth: Payer: Self-pay | Admitting: Hematology and Oncology

## 2018-09-09 NOTE — Telephone Encounter (Signed)
I talk with patient regarding schedule  

## 2018-09-22 DIAGNOSIS — J449 Chronic obstructive pulmonary disease, unspecified: Secondary | ICD-10-CM | POA: Diagnosis not present

## 2018-09-27 ENCOUNTER — Other Ambulatory Visit: Payer: Self-pay

## 2018-09-27 NOTE — Patient Outreach (Addendum)
[facility] Care Management Chronic Special Needs Program  09/27/2018  Name: [name] DOB: 03/05/1945  MRN: [id number]  [name] is enrolled in a chronic special needs plan for Diabetes. Reviewed and updated care plan.  Subjective: Client states she has been doing good.  States her breathing is about the same with good days and bad days.  States her blood sugars range 100-149.  States she is trying to follow a health diet.  States she does try to walk to the mailbox daily and tries to stay active around her home.  States she is in the doughnut hole now and her Xarelto is expensive.  Goals Addressed            This Visit's Progress   . Client understands the importance of follow-up with providers by attending scheduled visits   On track   . Client will report no worsening of symptoms related to heart disease within the next 6 months (continue 09/27/18)   On track   . Client will use Assistive Devices as needed and verbalize understanding of device use   On track   . Client will verbalize knowledge of chronic lung disease as evidenced by no ED visits or Inpatient stays related to chronic lung disease    On track   . Client will verbalize knowledge of self management of Hypertension as evidences by BP reading of 140/90 or less; or as defined by provider   On track   . HEMOGLOBIN A1C < 7.0       Diabetes self management actions:  Glucose monitoring per provider recommendations  Eat Healthy  Check feet daily  Visit provider every 3-6 months as directed  Hbg A1C level every 3-6 months.  Eye Exam yearly    . Maintain timely refills of diabetic medication as prescribed within the year .   On track   . COMPLETED: Obtain annual  Lipid Profile, LDL-C       Completed 01/29/2018    . COMPLETED: Obtain Annual Eye (retinal)  Exam        Reports completed in July    . COMPLETED: Obtain Annual Foot Exam       Completed 01/29/2018    . COMPLETED:  Obtain annual screen for micro albuminuria (urine) , nephropathy (kidney problems)       Completed 01/29/2018    . Obtain Hemoglobin A1C at least 2 times per year   On track    Completed 01/29/2018    . Visit Primary Care Provider or Endocrinologist at least 2 times per year        Completed 02/06/2018     Client is  meeting diabetes self management goal of hemoglobin A1C of <7% with last reading of 4.4% Reinforced to follow a low CHO diet and to watch her portion sizes Encouraged to be as active as her breathing will allow Instructed will contact [facility] pharmacist about being in the coverage gap and cost of Xarelto  Reviewed number for 24-hour nurse Line Discussed COVID19 cause, symptoms, precautions (social distancing, stay at home order, hand washing), confirmed client knows how to contact provider  Plan:  Send successful outreach letter with a copy of their individualized care plan and Send individual care plan to provider  Chronic care management coordinator will outreach in:  4-6 Months  Will refer to:  Pharmacy Messaged Nunzio CobbsKatina Boyd, [facility] about possible pharmacy assistance for Xarelto    Dudley MajorMelissa  RN, [facility] CenterBSN,CCM, CDE Chronic  Care Management Coordinator [place] [facility] [phone]

## 2018-09-30 ENCOUNTER — Other Ambulatory Visit: Payer: Self-pay | Admitting: Family Medicine

## 2018-09-30 DIAGNOSIS — Z1231 Encounter for screening mammogram for malignant neoplasm of breast: Secondary | ICD-10-CM

## 2018-10-01 ENCOUNTER — Other Ambulatory Visit: Payer: Self-pay | Admitting: Pharmacist

## 2018-10-01 NOTE — Patient Outreach (Signed)
[facility]) Care Management  [place]   10/01/2018  [name] [name] Mar 07, 1945 [id number]  Reason for referral: medication assistance  Referral source: CSNP Nurse Referral medication(s): Xarelto Current insurance: HTA/CSNP  HPI:  Allergic rhinitis, CAD  Objective: Allergies  Allergen Reactions  . Lisinopril     cough  . Sulfonamide Derivatives Rash    Medications Reviewed Today    Reviewed by Elayne Guerin, [facility] (Pharmacist) on 10/01/18 at [phone]  Med List Status: <None>  Medication Order Taking? Sig Documenting Provider Last Dose Status Informant  albuterol (PROAIR HFA) 108 (90 Base) MCG/ACT inhaler [id number] Yes INHALE 2 PUFFS INTO THE LUNGS EVERY 6 (SIX) HOURS AS NEEDED FOR WHEEZING OR SHORTNESS OF BREATH.  Patient taking differently: Inhale 2 puffs into the lungs every 6 (six) hours as needed for wheezing or shortness of breath.    Collene Gobble, MD Taking Active Self  albuterol (PROVENTIL) (2.5 MG/3ML) 0.083% nebulizer solution [id number] Yes Take 3 mLs (2.5 mg total) by nebulization every 4 (four) hours as needed for wheezing or shortness of breath (DX: COPD 496). Collene Gobble, MD Taking Active Self  Ascorbic Acid (VITAMIN C) 1000 MG tablet [id number] Yes Take 1,000 mg by mouth daily.   [provider] Taking Active Self  aspirin 81 MG chewable tablet [id number] Yes Chew 81 mg by mouth at bedtime.  [provider] Taking Active Self  atenolol (TENORMIN) 50 MG tablet [id number] Yes Take 50 mg by mouth daily.   [provider] Taking Active Self  atorvastatin (LIPITOR) 20 MG tablet [id number] Yes Take 20 mg by mouth daily.   [provider] Taking Active Self  Calcium Carbonate-Vitamin D (CALTRATE 600+D) 600-400 MG-UNIT per tablet [id number] Yes Take 2 tablets by mouth daily.   [provider] Taking Active Self       Patient not taking:      Discontinued 10/01/18 0854 (Completed Course)   fexofenadine  (ALLEGRA) 180 MG tablet [id number] Yes Take 180 mg by mouth daily. [provider] Taking Active Self  fluticasone (FLONASE) 50 MCG/ACT nasal spray [id number] Yes SPRAY 2 SPRAYS INTO EACH NOSTRIL EVERY DAY Byrum, Rose Fillers, MD Taking Active   levothyroxine (SYNTHROID, LEVOTHROID) 100 MCG tablet [id number] Yes Take 100 mcg by mouth daily. [provider] Taking Active Self  losartan (COZAAR) 25 MG tablet [id number] Yes Take 25 mg by mouth daily. [provider] Taking Active Self  metFORMIN (GLUCOPHAGE) 500 MG tablet [id number] Yes Take 500 mg by mouth daily with breakfast.  [provider] Taking Active Self  montelukast (SINGULAIR) 10 MG tablet [id number] Yes Take 1 tablet (10 mg total) by mouth at bedtime. Collene Gobble, MD Taking Active   Multiple Vitamins-Minerals (CENTRUM PO) [id number] Yes Take 1 tablet by mouth daily.   [provider] Taking Active Self  naproxen sodium (ALEVE) 220 MG tablet [id number] Yes Take 220 mg by mouth daily as needed (pain). [provider] Taking Active Self  nitroGLYCERIN (NITROSTAT) 0.4 MG SL tablet [id number]  Place 1 tablet (0.4 mg total) under the tongue every 5 (five) minutes as needed for chest pain. Erlene Quan, PA-C  Expired 01/01/18 2359 Self  omeprazole (PRILOSEC) 20 MG capsule [id number] Yes TAKE 1 CAPSULE (20 MG TOTAL) BY MOUTH 2 (TWO) TIMES DAILY BEFORE A MEAL. Collene Gobble, MD Taking Active   ONE TOUCH ULTRA TEST test strip [id number] Yes As directed [provider] Taking Active Self  Patient not taking:      Discontinued 10/01/18 0856 (Completed Course)        Patient not taking:      Discontinued 10/01/18 0856 (Completed Course)   Tiotropium Bromide-Olodaterol (STIOLTO RESPIMAT) 2.5-2.5 MCG/ACT AERS [id number] Yes Inhale 2 puffs into the lungs daily. Leslye Peer, MD Taking Active   traZODone (DESYREL) 50 MG tablet [id number] Yes Take 50 mg by mouth at bedtime.  [provider]  Taking Active Self           Med Note (COX, HEATHER C   Tue Dec 05, 2016  9:04 AM)    venlafaxine XR (EFFEXOR-XR) 150 MG 24 hr capsule [id number] Yes Take 150 mg by mouth daily.  [provider] Taking Active Self  XARELTO 20 MG TABS tablet [id number] Yes Take 20 mg by mouth daily with supper.  [provider] Taking Active Self          Assessment:  Drugs sorted by system:  Neurologic/Psychologic: Trazodone, Venlafaxine XR  Cardiovascular: Aspirin, Nitroglycerin, Xarelto, Atorvastatin, Losartan, Atenolol,   Pulmonary/Allergy: Albuterol HFA, Albuterol Nebulizer Solution, Montelukast, Spiriva, Fexofenadine, Fluticasone Nasal Spray  Gastrointestinal: Omeprazole,   Endocrine: Metformin, Levothyroxine,  Renal: Naproxen  Vitamins/Minerals/Supplements: Calcium Carbonate + Vitamin D, Multiple Vitamin, Vitamin C      Medication Review Findings:  . HgA1c-4.4 o On statin-atorvastatin last filled 05/30/2018 . FBS- 134 mg/dl .   Medication Assistance Findings:  Medication assistance needs identified: Xarelto  Xarelto is available from [employer] Patient Assistance Program. Their program requires patients to spend at least 4% of their income on medication expenses.  According to HA, the patient has spent:  $197.60, TDS $4,348.58  According to her reported income, she would need to spend about $770.    Additional medication assistance options reviewed with patient as warranted:  No other options identified   Patient said she would just continue to get samples from her provider.   Plan: I will follow up with the patient in 3 months.   [name], PharmD, BCACP [facility] Clinical Pharmacist [phone]

## 2018-10-04 ENCOUNTER — Ambulatory Visit: Payer: Self-pay | Admitting: Pharmacist

## 2018-10-05 ENCOUNTER — Other Ambulatory Visit: Payer: Self-pay | Admitting: Emergency Medicine

## 2018-10-07 DIAGNOSIS — J449 Chronic obstructive pulmonary disease, unspecified: Secondary | ICD-10-CM | POA: Diagnosis not present

## 2018-10-07 DIAGNOSIS — N183 Chronic kidney disease, stage 3 (moderate): Secondary | ICD-10-CM | POA: Diagnosis not present

## 2018-10-07 DIAGNOSIS — E1121 Type 2 diabetes mellitus with diabetic nephropathy: Secondary | ICD-10-CM | POA: Diagnosis not present

## 2018-10-07 DIAGNOSIS — E039 Hypothyroidism, unspecified: Secondary | ICD-10-CM | POA: Diagnosis not present

## 2018-10-07 DIAGNOSIS — D509 Iron deficiency anemia, unspecified: Secondary | ICD-10-CM | POA: Diagnosis not present

## 2018-10-07 DIAGNOSIS — I10 Essential (primary) hypertension: Secondary | ICD-10-CM | POA: Diagnosis not present

## 2018-10-08 DIAGNOSIS — E1121 Type 2 diabetes mellitus with diabetic nephropathy: Secondary | ICD-10-CM | POA: Diagnosis not present

## 2018-10-08 DIAGNOSIS — D509 Iron deficiency anemia, unspecified: Secondary | ICD-10-CM | POA: Diagnosis not present

## 2018-10-08 DIAGNOSIS — I10 Essential (primary) hypertension: Secondary | ICD-10-CM | POA: Diagnosis not present

## 2018-10-08 DIAGNOSIS — E039 Hypothyroidism, unspecified: Secondary | ICD-10-CM | POA: Diagnosis not present

## 2018-10-22 DIAGNOSIS — J449 Chronic obstructive pulmonary disease, unspecified: Secondary | ICD-10-CM | POA: Diagnosis not present

## 2018-11-13 ENCOUNTER — Telehealth: Payer: Self-pay | Admitting: Emergency Medicine

## 2018-11-13 NOTE — Telephone Encounter (Signed)
Primary Pulmonologist: RB Last office visit and with whom: 07/30/2018 with RB What do we see them for (pulmonary problems): COPD Last OV assessment/plan: Instructions  Please continue Stiolto 2 puffs once daily as you have been taking it. Keep your albuterol available to use either 1 nebulizer treatment or 2 puffs up to every 4 hours if needed for shortness of breath, chest tightness, wheezing. We will provide you with a prescription for prednisone to take with you as you travel.  Please call [place] if you have to start this medication. Continue your oxygen at 2 to 3 L/min with exertion. Continue Singulair 10 mg each evening Continue fluticasone nasal spray (Flonase) 2 sprays each nostril once daily. Continue Allegra as you have been taking it You should consider restarting your nasal saline rinses every day during the fall and spring months.  You can also use these for flaring symptoms, increased congestion at other times.  Continue omeprazole as you have been taking it Follow with Dr Lamonte Sakai in 3 - 4 months or sooner if you have any problems.        Was appointment offered to patient (explain)?  Pt wants recommendations   Reason for call: Called and spoke with pt who stated she has had problems with SOB which began today 11/4. Pt said even with wearing her 3L O2, she has been having problems and even took one breathing treatment which she said she did get some relief. Asked pt if she had any decrease in O2 sats and pt said that she does not have a pulse ox to be able to check but she said she believed that it probably did drop due to how she was breathing.  Pt does have problems of wheezing and also a cough with creamy phlegm. Asked pt if she has had any fever and she said that she was unsure if she had been running a temp as she does not have a thermometer that she can use to check her temp. Pt did say that she got a little warm today but she believes it to be due to exertion that she had  become warm as it does not happen much. Pt also has had to use her rescue inhaler twice daily to help with her symptoms. Pt is using her stiolto daily as well as her other meds prescribed.  Pt wants to know what is recommended to help with her symptoms. Beth, please advise on this for pt. Thanks!

## 2018-11-13 NOTE — Telephone Encounter (Signed)
Pt returning phone call ° °

## 2018-11-13 NOTE — Progress Notes (Signed)
Virtual Visit via Telephone Note  I connected with [name] [name] on 11/14/18 at 10:30 AM EST by telephone and verified that I am speaking with the correct person using two identifiers.  Location: Patient: Home Provider: Office [facility] - [address] [place], [address], [place], [place] [address]   I discussed the limitations, risks, security and privacy concerns of performing an evaluation and management service by telephone and the availability of in person appointments. I also discussed with the patient that there may be a patient responsible charge related to this service. The patient expressed understanding and agreed to proceed.  Patient consented to consult via telephone: Yes People present and their role in pt care: Pt   History of Present Illness:  73 year old female former smoker followed in our office for moderate COPD, chronic cough, irritable upper airway syndrome, chronic respiratory failure  PMH: GERD, allergic rhinitis, obesity Smoker/ Smoking History: Former smoker. Quit 1996. 60 pack year smoker.  Maintenance: Stiolto Respimat Pt of: Dr. Delton [name]  Chief complaint:    73 year old female former smoker followed in our office for COPD.  Patient contacted our office on 11/13/2018 letting us know that she has been having some worsened acute symptoms.  This prompted her to be scheduled as a televisit with her office on 11/14/2018.  Patient reporting that she has had increased worsening shortness of breath over the last 1 to 2 weeks.  This is significantly increased over the last 2 to 3 days.  She is had to use her rescue inhaler or her nebulizer treatments 2-3 times daily for the last week.  She continues to produce creamy colored mucus.  This is her baseline color sputum.  She has increased her sputum production.  She remains afebrile.  Her temperature today prior to getting her mammogram was 95.6.  She also reports that she is been having audible wheezing.  Overall she  feels fatigued.  Patient does not have any body aches or chills.  Does not report any recent sick contacts for concerns of SARS-CoV-2.  She reports adherence to her Stiolto Respimat.   Observations/Objective:  Calm Able to speak in complete sentences for televisit today No audible wheezing on phone today  09/04/2018-CBC with differential-eosinophils absolute 0.4, eosinophils relative 6  01/01/2018-chest x-ray-small lingular opacity noted which may reflect sequela of prior pneumonia  07/27/2017-echocardiogram-LV ejection fraction 60 to 65%, grade 1 diastolic dysfunction, systolic pressure within normal range  Assessment and Plan:  Chronic respiratory failure with hypoxia (HCC) Plan: Continue 2 to 3 L pulsed via POC with physical exertion  COPD (chronic obstructive pulmonary disease) (HCC) Plan: Z-Pak today Prednisone taper today Continue Stiolto Respimat Continue rescue inhaler or albuterol nebulized meds every 4-6 hours as needed for shortness of breath or wheezing If symptoms are not improving please contact our office Close follow-up in office in 4 weeks  May need to consider pulmonary function testing in the future   Follow Up Instructions:  Return in about 4 weeks (around 12/12/2018), or if symptoms worsen or fail to improve, for Follow up with [name] Headland FNP-C, Follow up with Dr. Delton [name].   I discussed the assessment and treatment plan with the patient. The patient was provided an opportunity to ask questions and all were answered. The patient agreed with the plan and demonstrated an understanding of the instructions.   The patient was advised to call back or seek an in-person evaluation if the symptoms worsen or if the condition fails to improve as anticipated.  I provided 24 minutes of non-face-to-face time during this encounter.   Lauraine Rinne, NP

## 2018-11-13 NOTE — Telephone Encounter (Signed)
Please make televisit tomorrow

## 2018-11-13 NOTE — Telephone Encounter (Signed)
Call made to patient, televisit made. Nothing further needed at this time.

## 2018-11-14 ENCOUNTER — Ambulatory Visit
Admission: RE | Admit: 2018-11-14 | Discharge: 2018-11-14 | Disposition: A | Discharge status: Home / Self Care | Payer: HMO | Source: Ambulatory Visit | Attending: Family Medicine | Admitting: Family Medicine

## 2018-11-14 ENCOUNTER — Other Ambulatory Visit: Payer: Self-pay

## 2018-11-14 ENCOUNTER — Ambulatory Visit (INDEPENDENT_AMBULATORY_CARE_PROVIDER_SITE_OTHER): Payer: HMO | Admitting: Pulmonary Disease

## 2018-11-14 ENCOUNTER — Encounter: Payer: Self-pay | Admitting: Pulmonary Disease

## 2018-11-14 DIAGNOSIS — J449 Chronic obstructive pulmonary disease, unspecified: Secondary | ICD-10-CM

## 2018-11-14 DIAGNOSIS — J9611 Chronic respiratory failure with hypoxia: Secondary | ICD-10-CM

## 2018-11-14 DIAGNOSIS — Z1231 Encounter for screening mammogram for malignant neoplasm of breast: Secondary | ICD-10-CM

## 2018-11-14 MED ORDER — AZITHROMYCIN 250 MG PO TABS: ORAL_TABLET | ORAL | 0 refills | Status: DC

## 2018-11-14 MED ORDER — PREDNISONE 10 MG PO TABS: ORAL_TABLET | ORAL | 0 refills | Status: DC

## 2018-11-14 NOTE — Assessment & Plan Note (Signed)
Plan: Z-Pak today Prednisone taper today Continue Stiolto Respimat Continue rescue inhaler or albuterol nebulized meds every 4-6 hours as needed for shortness of breath or wheezing If symptoms are not improving please contact our office Close follow-up in office in 4 weeks  May need to consider pulmonary function testing in the future

## 2018-11-14 NOTE — Patient Instructions (Addendum)
You were seen today by [name], NP  for:   1. Chronic respiratory failure with hypoxia (HCC)  Continue oxygen therapy as prescribed  >>>maintain oxygen saturations greater than 88 percent  >>>if unable to maintain oxygen saturations please contact the office  >>>do not smoke with oxygen  >>>can use nasal saline gel or nasal saline rinses to moisturize nose if oxygen causes dryness   2. Chronic obstructive pulmonary disease, unspecified COPD type (HCC)  - azithromycin (ZITHROMAX) 250 MG tablet;  (two tablets) today, then  (1 tablet) for the next 4 days  Dispense: 6 tablet; Refill: 0 - predniSONE (DELTASONE) 10 MG tablet; 4 tabs for 2 days, then 3 tabs for 2 days, 2 tabs for 2 days, then 1 tab for 2 days, then stop  Dispense: 20 tablet; Refill: 0  Stiolto Respimat inhaler >>>2 puffs daily >>>Take this no matter what >>>This is not a rescue inhaler  Only use your albuterol as a rescue medication to be used if you can't catch your breath by resting or doing a relaxed purse lip breathing pattern.  - The less you use it, the better it will work when you need it. - Ok to use up to 2 puffs  every 4 hours if you must but call for immediate appointment if use goes up over your usual need - Don't leave home without it !!  (think of it like the spare tire for your car)   Note your daily symptoms > remember "red flags" for COPD:   >>>Increase in cough >>>increase in sputum production >>>increase in shortness of breath or activity  intolerance.   If you notice these symptoms, please call the office to be seen.     We recommend today:  No orders of the defined types were placed in this encounter.  No orders of the defined types were placed in this encounter.  Meds ordered this encounter  Medications   azithromycin (ZITHROMAX) 250 MG tablet    Sig:  (two tablets) today, then  (1 tablet) for the next 4 days    Dispense:  6 tablet    Refill:  0   predniSONE  (DELTASONE) 10 MG tablet    Sig: 4 tabs for 2 days, then 3 tabs for 2 days, 2 tabs for 2 days, then 1 tab for 2 days, then stop    Dispense:  20 tablet    Refill:  0    Follow Up:    Return in about 4 weeks (around 12/12/2018), or if symptoms worsen or fail to improve, for Follow up with Elisha Headland FNP-C, Follow up with Dr. Delton Coombes.   Please do your part to reduce the spread of COVID-19:      Reduce your risk of any infection  and COVID19 by using the similar precautions used for avoiding the common cold or flu:   Wash your hands often with soap and warm water for at least 20 seconds.  If soap and water are not readily available, use an alcohol-based hand sanitizer with at least 60% alcohol.   If coughing or sneezing, cover your mouth and nose by coughing or sneezing into the elbow areas of your shirt or coat, into a tissue or into your sleeve (not your hands).  WEAR A MASK when in public   Avoid shaking hands with others and consider head nods or verbal greetings only.  Avoid touching your eyes, nose, or mouth with unwashed hands.   Avoid close contact with people  who are sick.  Avoid places or events with large numbers of people in one location, like concerts or sporting events.  If you have some symptoms but not all symptoms, continue to monitor at home and seek medical attention if your symptoms worsen.  If you are having a medical emergency, call 911.   ADDITIONAL HEALTHCARE OPTIONS FOR PATIENTS  [address] [facility] / e-Visit: [email or web address]         [facility]: [phone]  [facility]: [phone]                   [facility]: [phone]     It is flu season:   >>> Best ways to protect herself from the flu: Receive the yearly flu vaccine, practice good hand hygiene washing with soap and also using hand sanitizer when available, eat a nutritious meals, get adequate rest,  hydrate appropriately   Please contact the office if your symptoms worsen or you have concerns that you are not improving.   Thank you for choosing [facility] for your healthcare, and for allowing us to partner with you on your healthcare journey. I am thankful to be able to provide care to you today.   Elisha HeadlandBrian Netta Fodge FNP-C    Chronic Obstructive Pulmonary Disease Exacerbation Chronic obstructive pulmonary disease (COPD) is a long-term (chronic) lung problem. In COPD, the flow of air from the lungs is limited. COPD exacerbations are times that breathing gets worse and you need more than your normal treatment. Without treatment, they can be life threatening. If they happen often, your lungs can become more damaged. If your COPD gets worse, your doctor may treat you with:  Medicines.  Oxygen.  Different ways to clear your airway, such as using a mask. Follow these instructions at home: Medicines  Take over-the-counter and prescription medicines only as told by your doctor.  If you take an antibiotic or steroid medicine, do not stop taking the medicine even if you start to feel better.  Keep up with shots (vaccinations) as told by your doctor. Be sure to get a yearly (annual) flu shot. Lifestyle  Do not smoke. If you need help quitting, ask your doctor.  Eat healthy foods.  Exercise regularly.  Get plenty of sleep.  Avoid tobacco smoke and other things that can bother your lungs.  Wash your hands often with soap and water. This will help keep you from getting an infection. If you cannot use soap and water, use hand sanitizer.  During flu season, avoid areas that are crowded with people. General instructions  Drink enough fluid to keep your pee (urine) clear or pale yellow. Do not do this if your doctor has told you not to.  Use a cool mist machine (vaporizer).  If you use oxygen or a machine that turns medicine into a mist (nebulizer), continue to use it as  told.  Follow all instructions for rehabilitation. These are steps you can take to make your body work better.  Keep all follow-up visits as told by your doctor. This is important. Contact a doctor if:  Your COPD symptoms get worse than normal. Get help right away if:  You are short of breath and it gets worse.  You have trouble talking.  You have chest pain.  You cough up blood.  You have a fever.  You keep throwing up (vomiting).  You feel weak or you pass out (faint).  You feel confused.  You are  not able to sleep because of your symptoms.  You are not able to do daily activities. Summary  COPD exacerbations are times that breathing gets worse and you need more treatment than normal.  COPD exacerbations can be very serious and may cause your lungs to become more damaged.  Do not smoke. If you need help quitting, ask your doctor.  Stay up-to-date on your shots. Get a flu shot every year. This information is not intended to replace advice given to you by your health care provider. Make sure you discuss any questions you have with your health care provider. Document Released: 12/15/2010 Document Revised: 12/08/2016 Document Reviewed: 01/31/2016 Elsevier Patient Education  2020 [employer].    COPD and Physical Activity Chronic obstructive pulmonary disease (COPD) is a long-term (chronic) condition that affects the lungs. COPD is a general term that can be used to describe many different lung problems that cause lung swelling (inflammation) and limit airflow, including chronic bronchitis and emphysema. The main symptom of COPD is shortness of breath, which makes it harder to do even simple tasks. This can also make it harder to exercise and be active. Talk with your health care provider about treatments to help you breathe better and actions you can take to prevent breathing problems during physical activity. What are the benefits of exercising with COPD? Exercising  regularly is an important part of a healthy lifestyle. You can still exercise and do physical activities even though you have COPD. Exercise and physical activity improve your shortness of breath by increasing blood flow (circulation). This causes your heart to pump more oxygen through your body. Moderate exercise can improve your:  Oxygen use.  Energy level.  Shortness of breath.  Strength in your breathing muscles.  Heart health.  Sleep.  Self-esteem and feelings of self-worth.  Depression, stress, and anxiety levels. Exercise can benefit everyone with COPD. The severity of your disease may affect how hard you can exercise, especially at first, but everyone can benefit. Talk with your health care provider about how much exercise is safe for you, and which activities and exercises are safe for you. What actions can I take to prevent breathing problems during physical activity?  Sign up for a pulmonary rehabilitation program. This type of program may include: ? Education about lung diseases. ? Exercise classes that teach you how to exercise and be more active while improving your breathing. This usually involves:  Exercise using your lower extremities, such as a stationary bicycle.  About 30 minutes of exercise, 2 to 5 times per week, for 6 to 12 weeks  Strength training, such as push ups or leg lifts. ? Nutrition education. ? Group classes in which you can talk with others who also have COPD and learn ways to manage stress.  If you use an oxygen tank, you should use it while you exercise. Work with your health care provider to adjust your oxygen for your physical activity. Your resting flow rate is different from your flow rate during physical activity.  While you are exercising: ? Take slow breaths. ? Pace yourself and do not try to go too fast. ? Purse your lips while breathing out. Pursing your lips is similar to a kissing or whistling position. ? If doing exercise that uses  a quick burst of effort, such as weight lifting:  Breathe in before starting the exercise.  Breathe out during the hardest part of the exercise (such as raising the weights). Where to find support You can  find support for exercising with COPD from:  Your health care provider.  A pulmonary rehabilitation program.  Your local health department or community health programs.  Support groups, online or in-person. Your health care provider may be able to recommend support groups. Where to find more information You can find more information about exercising with COPD from:  American Lung Association: [place].hu.  COPD Foundation: AlmostHot.gl. Contact a health care provider if:  Your symptoms get worse.  You have chest pain.  You have nausea.  You have a fever.  You have trouble talking or catching your breath.  You want to start a new exercise program or a new activity. Summary  COPD is a general term that can be used to describe many different lung problems that cause lung swelling (inflammation) and limit airflow. This includes chronic bronchitis and emphysema.  Exercise and physical activity improve your shortness of breath by increasing blood flow (circulation). This causes your heart to provide more oxygen to your body.  Contact your health care provider before starting any exercise program or new activity. Ask your health care provider what exercises and activities are safe for you. This information is not intended to replace advice given to you by your health care provider. Make sure you discuss any questions you have with your health care provider. Document Released: 01/18/2017 Document Revised: 04/17/2018 Document Reviewed: 01/18/2017 Elsevier Patient Education  2020 [employer].

## 2018-11-14 NOTE — Assessment & Plan Note (Signed)
Plan: Continue 2 to 3 L pulsed via POC with physical exertion

## 2018-11-20 ENCOUNTER — Other Ambulatory Visit: Payer: Self-pay

## 2018-11-20 NOTE — Patient Outreach (Signed)
  [facility]) Care Management Chronic Special Needs Program    11/20/2018  Name: [name] [name], DOB: Feb 05, 1945  MRN: [id number]   [name] is enrolled in a chronic special needs plan for Diabetes. Received referral from Utilization Management that client would now like to get forms for pharmacy patient assistance for her high cost medication. Notified Denyse Amass, [facility] who is also involved in clients case of clients request  Plan to continue to follow as scheduled Peter Garter RN, Jackquline Denmark, [place] Management [phone]

## 2018-11-22 DIAGNOSIS — J449 Chronic obstructive pulmonary disease, unspecified: Secondary | ICD-10-CM | POA: Diagnosis not present

## 2018-11-25 ENCOUNTER — Other Ambulatory Visit: Payer: Self-pay | Admitting: Pharmacist

## 2018-11-25 NOTE — Patient Outreach (Signed)
[facility]) Care Management  11/25/2018  [name] [name] [birth year] [id number]  Patient was called for CSNP follow up and to answer a medication assistance question she expressed to [facility] CSNP Nurse, Dudley Major. HIPAA identifiers were obtained x2.  Patient is a 73 year old female with multiple medical conditions including but not limited to:  Allergic rhinitis, CAD, COPD, depression, hyperlipidemia, hypertension, GERD, iron deficiency anemia, Pulmonary Embolism, and obesity. She has been getting Proventil HFA and Stiolto from patient assistance programs for the majority of the year.  She was recently switched from Xarelto to Warfarin due to cost. [name] and [name] requires patients to spend at least 4% of their income in out-of-pocket medication expenses before patient's can qualify for their program.  Patient's medications were reviewed:  Medications Reviewed Today    Reviewed by [name], [facility] (Pharmacist) on 11/25/18 at 1015  Med List Status: <None>  Medication Order Taking? Sig Documenting Provider Last Dose Status Informant  albuterol (PROAIR HFA) 108 (90 Base) MCG/ACT inhaler [id number] Yes INHALE 2 PUFFS INTO THE LUNGS EVERY 6 (SIX) HOURS AS NEEDED FOR WHEEZING OR SHORTNESS OF BREATH.  Patient taking differently: Inhale 2 puffs into the lungs every 6 (six) hours as needed for wheezing or shortness of breath.    Leslye Peer, MD Taking Active Self  albuterol (PROVENTIL) (2.5 MG/3ML) 0.083% nebulizer solution [id number] Yes Take 3 mLs (2.5 mg total) by nebulization every 4 (four) hours as needed for wheezing or shortness of breath (DX: COPD 496). Leslye Peer, MD Taking Active Self  Ascorbic Acid (VITAMIN C) 1000 MG tablet [id number] Yes Take 1,000 mg by mouth daily.   [provider] Taking Active Self  aspirin 81 MG chewable tablet [id number] Yes Chew 81 mg by mouth at bedtime.  [provider] Taking Active Self  atenolol (TENORMIN)  50 MG tablet [id number] Yes Take 50 mg by mouth daily.   [provider] Taking Active Self  atorvastatin (LIPITOR) 20 MG tablet [id number] Yes Take 20 mg by mouth daily.   [provider] Taking Active Self  Calcium Carbonate-Vitamin D (CALTRATE 600+D) 600-400 MG-UNIT per tablet [id number] Yes Take 2 tablets by mouth daily.   [provider] Taking Active Self  ferrous sulfate 325 (65 FE) MG tablet [id number] Yes Take 325 mg by mouth daily. [provider] Taking Active   fexofenadine (ALLEGRA) 180 MG tablet [id number] Yes Take 180 mg by mouth daily. [provider] Taking Active Self  fluticasone (FLONASE) 50 MCG/ACT nasal spray [id number] Yes SPRAY 2 SPRAYS INTO EACH NOSTRIL EVERY DAY Byrum, Les Pou, MD Taking Active   levothyroxine (SYNTHROID) 88 MCG tablet [id number] Yes Take 88 mcg by mouth daily. [provider] Taking Active   losartan (COZAAR) 25 MG tablet [id number] Yes Take 25 mg by mouth daily. [provider] Taking Active Self  metFORMIN (GLUCOPHAGE) 500 MG tablet [id number] Yes Take 500 mg by mouth daily with breakfast.  [provider] Taking Active Self  montelukast (SINGULAIR) 10 MG tablet [id number] Yes Take 1 tablet (10 mg total) by mouth at bedtime. Leslye Peer, MD Taking Active   Multiple Vitamins-Minerals (CENTRUM PO) [id number] Yes Take 1 tablet by mouth daily.   [provider] Taking Active Self  naproxen sodium (ALEVE) 220 MG tablet [id number] Yes Take 220 mg by mouth daily as needed (pain). [provider] Taking Active Self  nitroGLYCERIN (NITROSTAT) 0.4 MG SL tablet [id number]  Place 1 tablet (0.4 mg total)  under the tongue every 5 (five) minutes as needed for chest pain. Erlene Quan, PA-C  Expired 01/01/18 2359 Self  omeprazole (PRILOSEC) 20 MG capsule [id number] Yes TAKE 1 CAPSULE (20 MG TOTAL) BY MOUTH 2 (TWO) TIMES DAILY BEFORE A MEAL. Collene Gobble, MD Taking Active   ONE TOUCH ULTRA TEST  test strip [id number] Yes As directed [provider] Taking Active Self  Tiotropium Bromide-Olodaterol (STIOLTO RESPIMAT) 2.5-2.5 MCG/ACT AERS [id number] Yes Inhale 2 puffs into the lungs daily. Collene Gobble, MD Taking Active   traZODone (DESYREL) 50 MG tablet [id number] Yes Take 50 mg by mouth at bedtime.  [provider] Taking Active Self           Med Note (COX, HEATHER C   Tue Dec 05, 2016  9:04 AM)    venlafaxine XR (EFFEXOR-XR) 150 MG 24 hr capsule [id number] Yes Take 150 mg by mouth daily.  [provider] Taking Active Self  warfarin (COUMADIN) 5 MG tablet [id number] Yes Take 5 mg by mouth daily. [provider] Taking Active          Findings:  HgA1c-6.5% 09/2018 On statin-atorvastatin last filled 11/21/2018 #90  Patient Assistance- Patient shared that she received an application from [facility] Patient Assistance Program and wondered if she should fill it out.   Patient was instructed to hold on to the application and that [facility] would outreach her for 2021.  Plan: I will route patient assistance letter to [place] technician who will coordinate patient assistance program application process for medications listed above.  [facility] pharmacy technician will assist with obtaining all required documents from both patient and provider(s) and submit application(s) once completed.     Follow up with patient in 3 months.  Elayne Guerin, PharmD, [place] Clinical Pharmacist [phone]

## 2018-11-26 ENCOUNTER — Other Ambulatory Visit: Payer: Self-pay | Admitting: Pharmacy Technician

## 2018-11-26 NOTE — Patient Outreach (Signed)
[facility]) Care Management  11/26/2018  Keslie Gritz November 01, 1945 [id number]                                        Medication Assistance Referral  Referral From: [place]  Medication/Company: Proventil HFA / [employer] Patient application portion:  Education officer, museum portion: Interoffice Mailed to Dr. Baltazar Apo Provider address/fax verified via: Office website  Medication/Company: Beau Fanny / [employer] Patient application portion:  Mailed Provider application portion: Faxed  to Dr. Baltazar Apo Provider address/fax verified via: Office website  This is for re enrollment for 2021.   Follow up:  Will follow up with patient in 30-40  days to confirm application(s) have been received.  Roslyn Else P. Mansour Balboa, [place] Management [phone]

## 2018-12-02 ENCOUNTER — Telehealth: Payer: Self-pay | Admitting: Pulmonary Disease

## 2018-12-02 NOTE — Telephone Encounter (Signed)
Called and spoke with pt letting her know the info stated by Aaron Edelman that we needed to schedule her for visit tomorrow 11/24 to further evaluate. Pt verbalized understanding. Visit scheduled for pt tomorrow 11/24 at 1:30. Nothing further needed.

## 2018-12-02 NOTE — Telephone Encounter (Signed)
Patient needs a virtual visit scheduled on 01/02/2019 to further evaluate.Wyn Quaker, FNP

## 2018-12-02 NOTE — Telephone Encounter (Signed)
lmtcb for pt.  

## 2018-12-02 NOTE — Telephone Encounter (Signed)
Primary Pulmonologist: RB Last office visit and with whom: 11/14/2018 with Aaron Edelman What do we see them for (pulmonary problems): COPD Last OV assessment/plan: Instructions    Return in about 4 weeks (around 12/12/2018), or if symptoms worsen or fail to improve, for Follow up with Wyn Quaker FNP-C, Follow up with Dr. Lamonte Sakai. You were seen today by Lauraine Rinne, NP  for:   1. Chronic respiratory failure with hypoxia (HCC)  Continue oxygen therapy as prescribed  >>>maintain oxygen saturations greater than 88 percent  >>>if unable to maintain oxygen saturations please contact the office  >>>do not smoke with oxygen  >>>can use nasal saline gel or nasal saline rinses to moisturize nose if oxygen causes dryness   2. Chronic obstructive pulmonary disease, unspecified COPD type ([place])  - azithromycin (ZITHROMAX) 250 MG tablet; 500mg  (two tablets) today, then 250mg  (1 tablet) for the next 4 days  Dispense: 6 tablet; Refill: 0 - predniSONE (DELTASONE) 10 MG tablet; 4 tabs for 2 days, then 3 tabs for 2 days, 2 tabs for 2 days, then 1 tab for 2 days, then stop  Dispense: 20 tablet; Refill: 0  Stiolto Respimat inhaler >>>2 puffs daily >>>Take this no matter what >>>This is not a rescue inhaler  Only use your albuterol as a rescue medication to be used if you can't catch your breath by resting or doing a relaxed purse lip breathing pattern.  - The less you use it, the better it will work when you need it. - Ok to use up to 2 puffs every 4 hours if you must but call for immediate appointment if use goes up over your usual need - Don't leave home without it !! (think of it like the spare tire for your car)   Note your daily symptoms >remember "red flags" for COPD:  >>>Increase in cough >>>increase in sputum production >>>increase in shortness of breath or activity  intolerance.   If you notice these symptoms, please call the office to be seen.     We recommend today:  No orders  of the defined types were placed in this encounter.  No orders of the defined types were placed in this encounter.      Meds ordered this encounter  Medications  . azithromycin (ZITHROMAX) 250 MG tablet    Sig: 500mg  (two tablets) today, then 250mg  (1 tablet) for the next 4 days    Dispense:  6 tablet    Refill:  0  . predniSONE (DELTASONE) 10 MG tablet    Sig: 4 tabs for 2 days, then 3 tabs for 2 days, 2 tabs for 2 days, then 1 tab for 2 days, then stop    Dispense:  20 tablet    Refill:  0    Follow Up:    Return in about 4 weeks (around 12/12/2018), or if symptoms worsen or fail to improve, for Follow up with Wyn Quaker FNP-C, Follow up with Dr. Lamonte Sakai.     Was appointment offered to patient (explain)?  Pt requesting more prednisone   Reason for call: Called and spoke with pt who stated her breathing has become worse again since last televisit with Brian 11/5 and she is also having to use her neb twice daily.  Asked pt if she has been running a temp and pt said she does not have a thermometer that she is able to use to check her temp. I asked her if she has felt feverish any and pt said at times  but not bad. Pt stated she is coughing up milky phlegm.  Asked pt if she is wheezing and she said that she has been wheezing.  Pt is scheduled for an appt 12/3.  Pt is requesting to have more prednisone called in. Arlys John, please advise. Thanks!

## 2018-12-02 NOTE — Telephone Encounter (Signed)
Pt returning call.  [phone]

## 2018-12-03 ENCOUNTER — Other Ambulatory Visit: Payer: Self-pay

## 2018-12-03 ENCOUNTER — Ambulatory Visit (INDEPENDENT_AMBULATORY_CARE_PROVIDER_SITE_OTHER): Payer: HMO | Admitting: Pulmonary Disease

## 2018-12-03 DIAGNOSIS — J301 Allergic rhinitis due to pollen: Secondary | ICD-10-CM

## 2018-12-03 DIAGNOSIS — J9611 Chronic respiratory failure with hypoxia: Secondary | ICD-10-CM

## 2018-12-03 DIAGNOSIS — J449 Chronic obstructive pulmonary disease, unspecified: Secondary | ICD-10-CM

## 2018-12-03 NOTE — Progress Notes (Signed)
Virtual Visit via Telephone Note  Patient canceled virtual visit.  This was never completed.  History of Present Illness:  73 year old female former smoker followed in our office for moderate COPD, chronic cough, irritable upper airway syndrome, chronic respiratory failure  PMH: GERD, allergic rhinitis, obesity Smoker/ Smoking History: Former smoker. Quit 1996. 60 pack year smoker.  Maintenance: Stiolto Respimat Pt of: Dr. Lamonte Sakai  Immunization History  Administered Date(s) Administered  . Influenza Whole 12/09/2008, 10/09/2009, 10/02/2010, 09/10/2011  . Influenza, High Dose Seasonal PF 10/07/2016, 10/15/2017, 08/22/2018  . Influenza,inj,Quad PF,6+ Mos 10/01/2012, 10/08/2013, 09/30/2014, 10/11/2015  . Pneumococcal Conjugate-13 10/09/2013  . Pneumococcal-Unspecified 10/02/2010  . Zoster Recombinat (Shingrix) 02/27/2018, 07/01/2018    Social History   Tobacco Use  Smoking Status Former Smoker  . Packs/day: 2.00  . Years: 30.00  . Pack years: 60.00  . Quit date: 01/09/1994  . Years since quitting: 24.9  Smokeless Tobacco Never Used    Chief complaint:     Observations/Objective:  09/04/2018-CBC with differential-eosinophils absolute 0.4, eosinophils relative 6  01/01/2018-chest x-ray-small lingular opacity noted which may reflect sequela of prior pneumonia  07/27/2017-echocardiogram-LV ejection fraction 60 to 34%, grade 1 diastolic dysfunction, systolic pressure within normal range  Assessment and Plan:  No problem-specific Assessment & Plan notes found for this encounter.   Follow Up Instructions:  No follow-ups on file.   I discussed the assessment and treatment plan with the patient. The patient was provided an opportunity to ask questions and all were answered. The patient agreed with the plan and demonstrated an understanding of the instructions.   The patient was advised to call back or seek an in-person evaluation if the symptoms worsen or if the condition fails  to improve as anticipated.  I provided 0 minutes of non-face-to-face time during this encounter.   Lauraine Rinne, NP

## 2018-12-04 ENCOUNTER — Other Ambulatory Visit: Payer: HMO

## 2018-12-06 ENCOUNTER — Ambulatory Visit: Payer: HMO | Admitting: Hematology and Oncology

## 2018-12-09 ENCOUNTER — Other Ambulatory Visit: Payer: Self-pay

## 2018-12-09 ENCOUNTER — Encounter: Payer: Self-pay | Admitting: Cardiovascular Disease

## 2018-12-09 ENCOUNTER — Inpatient Hospital Stay: Payer: HMO | Attending: Hematology and Oncology

## 2018-12-09 ENCOUNTER — Telehealth: Payer: Self-pay | Admitting: Pulmonary Disease

## 2018-12-09 ENCOUNTER — Ambulatory Visit: Payer: HMO | Admitting: Cardiovascular Disease

## 2018-12-09 VITALS — BP 118/62 | HR 106 | Ht 60.0 in | Wt 151.0 lb

## 2018-12-09 DIAGNOSIS — D509 Iron deficiency anemia, unspecified: Secondary | ICD-10-CM

## 2018-12-09 DIAGNOSIS — E7800 Pure hypercholesterolemia, unspecified: Secondary | ICD-10-CM

## 2018-12-09 DIAGNOSIS — I2510 Atherosclerotic heart disease of native coronary artery without angina pectoris: Secondary | ICD-10-CM

## 2018-12-09 DIAGNOSIS — I10 Essential (primary) hypertension: Secondary | ICD-10-CM

## 2018-12-09 LAB — CBC WITH DIFFERENTIAL (CANCER CENTER ONLY)
Abs Immature Granulocytes: 0.03 10*3/uL (ref 0.00–0.07)
Basophils Absolute: 0 10*3/uL (ref 0.0–0.1)
Basophils Relative: 1 %
Eosinophils Absolute: 0.3 10*3/uL (ref 0.0–0.5)
Eosinophils Relative: 5 %
HCT: 38.6 % (ref 36.0–46.0)
Hemoglobin: 12 g/dL (ref 12.0–15.0)
Immature Granulocytes: 0 %
Lymphocytes Relative: 18 %
Lymphs Abs: 1.2 10*3/uL (ref 0.7–4.0)
MCH: 28.7 pg (ref 26.0–34.0)
MCHC: 31.1 g/dL (ref 30.0–36.0)
MCV: 92.3 fL (ref 80.0–100.0)
Monocytes Absolute: 0.5 10*3/uL (ref 0.1–1.0)
Monocytes Relative: 8 %
Neutro Abs: 4.7 10*3/uL (ref 1.7–7.7)
Neutrophils Relative %: 68 %
Platelet Count: 273 10*3/uL (ref 150–400)
RBC: 4.18 MIL/uL (ref 3.87–5.11)
RDW: 13.4 % (ref 11.5–15.5)
WBC Count: 6.9 10*3/uL (ref 4.0–10.5)
nRBC: 0 % (ref 0.0–0.2)

## 2018-12-09 LAB — IRON AND TIBC
Iron: 93 ug/dL (ref 41–142)
Saturation Ratios: 27 % (ref 21–57)
TIBC: 339 ug/dL (ref 236–444)
UIBC: 246 ug/dL (ref 120–384)

## 2018-12-09 LAB — FERRITIN: Ferritin: 47 ng/mL (ref 11–307)

## 2018-12-09 NOTE — Telephone Encounter (Signed)
Left message for patient to call back  

## 2018-12-09 NOTE — Progress Notes (Signed)
Cardiology Office Note   Date:  12/09/2018   ID:  [name] [name], [name] [name] 1946-01-08, MRN [id number]  PCP:  Shirlean Mylar, MD  Cardiologist:   Chilton Si, MD   No chief complaint on file.    History of Present Illness: [name] [name] is a 73 y.o. female with moderate non-obstructive CAD, hypertension, hyperlipidemia, GERD, and COPD here for follow up. She was initially seen for the evaluation of chest tightness.  Her symptoms began 10/2016.  She saw Dr. Delton Coombes and reported shortness of breath and wheezing.  She was started on prednisone and Azithromycin.  She was seen at [facility] clinic 11/2016 due to chest tightness.  EKG revealed sinus rhythm and no ischemic changes.  There was concern that this was still a COPD exacerbation and prednisone was resumed.  She was referred for coronary CT-A 02/06/2017 that revealed moderate, diffuse calcification and atherosclerosis predominantly in the descending thoracic aorta.  She also had 25 to 50% left main disease with 25%-50% LAD disease with concern for an intramyocardial bridge.  There was also a greater than 70% left circumflex disease.  She was referred for left heart catheterization 02/09/2017 that revealed 10 to 40% nonobstructive disease.  No left main stenosis was noted.  Since that time she has been feeling well.  She has not had any chest pain.   Since her last appointment [name] [name] has been doing well from a cardiac standpoint.  She recently saw her pulmonologist and was treated for acute on chronic pulmonary disease.  Her symptoms have improved but she continues to wheeze somewhat.  She notes that her breathing is always more difficult when the weather is hot or cold.  She is nervous because she travels to [place] next week and thinks her lungs are going to flare.  Her brother is critically ill and will likely pass soon.  She has not been experiencing any chest pain.  She has no lower extremity edema, orthopnea, or PND.  She is  on warfarin chronically for prior DVT/PE.  She was previously on Xarelto but was unable to afford it.  She is not getting much exercise due to her underlying lung disease.  She has been trying to avoid crowds due to the coronavirus.   Past Medical History:  Diagnosis Date  . Allergic rhinitis, cause unspecified   . CAD in native artery 07/25/2017  . Chronic airway obstruction, not elsewhere classified   . Depressive disorder, not elsewhere classified   . Nephritis and nephropathy, not specified as acute or chronic, with unspecified pathological lesion in kidney   . Obesity, unspecified   . Other and unspecified hyperlipidemia   . Oxygen deficiency   . Thyrotoxicosis without mention of goiter or other cause, without mention of thyrotoxic crisis or storm   . Unspecified essential hypertension   . UTI (urinary tract infection) 2019 dec    Past Surgical History:  Procedure Laterality Date  . CHOLECYSTECTOMY    . LEFT HEART CATH AND CORONARY ANGIOGRAPHY N/A 03/01/2017   Procedure: LEFT HEART CATH AND CORONARY ANGIOGRAPHY;  Surgeon: Yvonne Kendall, MD;  Location: [facility] INVASIVE CV LAB;  Service: Cardiovascular;  Laterality: N/A;     Current Outpatient Medications  Medication Sig Dispense Refill  . albuterol (PROAIR HFA) 108 (90 Base) MCG/ACT inhaler INHALE 2 PUFFS INTO THE LUNGS EVERY 6 (SIX) HOURS AS NEEDED FOR WHEEZING OR SHORTNESS OF BREATH. (Patient taking differently: Inhale 2 puffs into the lungs every 6 (six) hours as needed  for wheezing or shortness of breath. ) 3 Inhaler 1  . albuterol (PROVENTIL) (2.5 MG/3ML) 0.083% nebulizer solution Take 3 mLs (2.5 mg total) by nebulization every 4 (four) hours as needed for wheezing or shortness of breath (DX: COPD 496). 150 mL 12  . Ascorbic Acid (VITAMIN C) 1000 MG tablet Take 1,000 mg by mouth daily.      Marland Kitchen. aspirin 81 MG chewable tablet Chew 81 mg by mouth at bedtime.     Marland Kitchen. atenolol (TENORMIN) 50 MG tablet Take 50 mg by mouth daily.      Marland Kitchen.  atorvastatin (LIPITOR) 20 MG tablet Take 20 mg by mouth daily.      . Calcium Carbonate-Vitamin D (CALTRATE 600+D) 600-400 MG-UNIT per tablet Take 2 tablets by mouth daily.      . ferrous sulfate 325 (65 FE) MG tablet Take 325 mg by mouth daily.    . fexofenadine (ALLEGRA) 180 MG tablet Take 180 mg by mouth daily.    . fluticasone (FLONASE) 50 MCG/ACT nasal spray SPRAY 2 SPRAYS INTO EACH NOSTRIL EVERY DAY 16 mL 5  . levothyroxine (SYNTHROID) 88 MCG tablet Take 88 mcg by mouth daily.    Marland Kitchen. losartan (COZAAR) 25 MG tablet Take 25 mg by mouth daily.    . metFORMIN (GLUCOPHAGE) 500 MG tablet Take 500 mg by mouth daily with breakfast.     . montelukast (SINGULAIR) 10 MG tablet Take 1 tablet (10 mg total) by mouth at bedtime. 30 tablet 11  . Multiple Vitamins-Minerals (CENTRUM PO) Take 1 tablet by mouth daily.      . naproxen sodium (ALEVE) 220 MG tablet Take 220 mg by mouth daily as needed (pain).    Marland Kitchen. omeprazole (PRILOSEC) 20 MG capsule TAKE 1 CAPSULE (20 MG TOTAL) BY MOUTH 2 (TWO) TIMES DAILY BEFORE A MEAL. 30 capsule 5  . ONE TOUCH ULTRA TEST test strip As directed    . Tiotropium Bromide-Olodaterol (STIOLTO RESPIMAT) 2.5-2.5 MCG/ACT AERS Inhale 2 puffs into the lungs daily. 2 Inhaler 0  . traZODone (DESYREL) 50 MG tablet Take 50 mg by mouth at bedtime.     Marland Kitchen. venlafaxine XR (EFFEXOR-XR) 150 MG 24 hr capsule Take 150 mg by mouth daily.     Marland Kitchen. warfarin (COUMADIN) 5 MG tablet Take 5 mg by mouth daily.    . nitroGLYCERIN (NITROSTAT) 0.4 MG SL tablet Place 1 tablet (0.4 mg total) under the tongue every 5 (five) minutes as needed for chest pain. 25 tablet 4   No current facility-administered medications for this visit.     Allergies:   Lisinopril and Sulfonamide derivatives    Social History:  The patient  reports that she quit smoking about 24 years ago. She has a 60.00 pack-year smoking history. She has never used smokeless tobacco. She reports that she does not drink alcohol or use drugs.   Family  History:  The patient's family history includes CAD in her mother; Cancer in her brother; Diabetes in her brother and mother; Heart failure in her mother; Hyperlipidemia in her mother; Hypertension in her mother; Stroke in her maternal grandfather.    ROS:  Please see the history of present illness.   Otherwise, review of systems are positive for none.   All other systems are reviewed and negative.    PHYSICAL EXAM: VS:  BP 118/62   Pulse (!) 106   Ht 5' (1.524 m)   Wt 151 lb (68.5 kg)   SpO2 93%   BMI 29.49 kg/m  ,  BMI Body mass index is 29.49 kg/m. GENERAL:  Well appearing.  No acute distress. HEENT:  Pupils equal round and reactive, fundi not visualized, oral mucosa unremarkable NECK:  No jugular venous distention, waveform within normal limits, carotid upstroke brisk and symmetric, no bruits, no thyromegaly LUNGS:  Mild expiratory wheezing diffusely.  HEART: Tachycardic.  Regular rhythm.  PMI not displaced or sustained,S1 and S2 within normal limits, no S3, no S4, no clicks, no rubs, no murmurs ABD:  Flat, positive bowel sounds normal in frequency in pitch, no bruits, no rebound, no guarding, no midline pulsatile mass, no hepatomegaly, no splenomegaly EXT:  2 plus pulses throughout, no edema, no cyanosis no clubbing SKIN:  No rashes no nodules NEURO:  Cranial nerves II through XII grossly intact, motor grossly intact throughout PSYCH:  Cognitively intact, oriented to person place and time   EKG:  EKG is ordered today. 11/20/16: Sinus rhythm.  Rate 90 bpm. 12/09/18: Sinus tachycardia.  Rate 117 bpm.   Recent Labs: 01/01/2018: ALT 11; BUN 22; Creatinine, Ser 1.27; Potassium 4.1; Sodium 138 12/09/2018: Hemoglobin 12.0; Platelet Count 273    06/20/16: Sodium 141, potassium 4.2, BUN 10, creatinine 0.95 AST 25, ALT 23 Hemoglobin A1c 6.7% Total cholesterol 130, triglycerides 158 HDL 43 LDL 55 TSH 0.96  07/03/2017: Total cholesterol 140, triglycerides 243, HDL 36, LDL 56 [place]  hemoglobin A1c 7.4% Hemoglobin 12.5 Potassium 4.5, creatinine 0.93 TSH 2.17   Lipid Panel No results found for: CHOL, TRIG, HDL, CHOLHDL, VLDL, LDLCALC, LDLDIRECT    Wt Readings from Last 3 Encounters:  12/09/18 151 lb (68.5 kg)  07/30/18 150 lb 6.4 oz (68.2 kg)  06/07/18 148 lb 4.8 oz (67.3 kg)      ASSESSMENT AND PLAN:  # Non-obstructive CAD:  # Hyperlipidemia: No angina.  LDL was 52 on 01/2018.  Continue atorvastatin, aspirin, atenolol.  # Hypertension:  Blood pressure was initially elevated but better on repeat.  She struggles with the walk to the exam room which also contributes to her tachycardia.   # Prior DVT/PE: On warfarin.  Current medicines are reviewed at length with the patient today.  The patient does not have concerns regarding medicines.  The following changes have been made:  no change  Labs/ tests ordered today include:   No orders of the defined types were placed in this encounter.    Disposition:   FU with Ernan Runkles C. Oval Linsey, MD, [facility] in one year.     Signed, Myley Bahner C. Oval Linsey, MD, [facility]  12/09/2018 1:33 PM    [facility]

## 2018-12-09 NOTE — Patient Instructions (Signed)
Medication Instructions:  Your physician recommends that you continue on your current medications as directed. Please refer to the Current Medication list given to you today.  *If you need a refill on your cardiac medications before your next appointment, please call your pharmacy*  Lab Work: NONE   Testing/Procedures: NONE   Follow-Up: At [facility], you and your health needs are our priority.  As part of our continuing mission to provide you with exceptional heart care, we have created designated Provider Care Teams.  These Care Teams include your primary Cardiologist (physician) and Advanced Practice Providers (APPs -  Physician Assistants and Nurse Practitioners) who all work together to provide you with the care you need, when you need it.  Your next appointment:   12 MONTHS You will receive a reminder letter in the mail two months in advance. If you don't receive a letter, please call our office to schedule the follow-up appointment.  The format for your next appointment:   Either In Person or Virtual  Provider:   You may see DR [facility] or one of the following Advanced Practice Providers on your designated Care Team:    Kerin Ransom, PA-C  [place], [place]  Coletta Memos, [place]

## 2018-12-10 ENCOUNTER — Ambulatory Visit (INDEPENDENT_AMBULATORY_CARE_PROVIDER_SITE_OTHER): Payer: HMO | Admitting: Pulmonary Disease

## 2018-12-10 ENCOUNTER — Encounter: Payer: Self-pay | Admitting: Pulmonary Disease

## 2018-12-10 DIAGNOSIS — J449 Chronic obstructive pulmonary disease, unspecified: Secondary | ICD-10-CM

## 2018-12-10 DIAGNOSIS — J9611 Chronic respiratory failure with hypoxia: Secondary | ICD-10-CM | POA: Diagnosis not present

## 2018-12-10 MED ORDER — PREDNISONE 10 MG PO TABS: ORAL_TABLET | ORAL | 0 refills | Status: DC

## 2018-12-10 MED ORDER — DOXYCYCLINE HYCLATE 100 MG PO TABS: 100.0000 mg | ORAL_TABLET | Freq: Two times a day (BID) | ORAL | 0 refills | Status: DC

## 2018-12-10 NOTE — Assessment & Plan Note (Signed)
Plan: Continue 2 to 3 L pulsed via POC with physical exertion 

## 2018-12-10 NOTE — Telephone Encounter (Signed)
Spoke with the pt  She is c/o increased SOB and wheezing  Televisit with Aaron Edelman today was scheduled

## 2018-12-10 NOTE — Patient Instructions (Addendum)
I am very sorry to hear about the loss of your brother.  Please travel safe.  Please contact our office if you need anything.  [name] [name]   You were seen today by [name], NP  for:   1. Chronic obstructive pulmonary disease, unspecified COPD type (HCC)  - predniSONE (DELTASONE) 10 MG tablet; 4 tabs for 2 days, then 3 tabs for 2 days, 2 tabs for 2 days, then 1 tab for 2 days, then stop  Dispense: 20 tablet; Refill: 0 - doxycycline (VIBRA-TABS) 100 MG tablet; Take 1 tablet (100 mg total) by mouth 2 (two) times daily.  Dispense: 14 tablet; Refill: 0  Go ahead and start taking the prednisone.  If you feel that you are starting to have worsened discolored mucus or worsened productive cough or you develop fevers please start taking the doxycycline.  Stiolto Respimat inhaler >>>2 puffs daily >>>Take this no matter what >>>This is not a rescue inhaler  Only use your albuterol as a rescue medication to be used if you can't catch your breath by resting or doing a relaxed purse lip breathing pattern.  - The less you use it, the better it will work when you need it. - Ok to use up to 2 puffs  every 4 hours if you must but call for immediate appointment if use goes up over your usual need - Don't leave home without it !!  (think of it like the spare tire for your car)   Note your daily symptoms > remember "red flags" for COPD:   >>>Increase in cough >>>increase in sputum production >>>increase in shortness of breath or activity  intolerance.   If you notice these symptoms, please call the office to be seen.     We will schedule close follow-up with our office to evaluate you in person to ensure symptoms are improving.  Please contact your office if you have any worsening concerns with your breathing.   2. Chronic respiratory failure with hypoxia (HCC)  Continue oxygen therapy as prescribed  >>>maintain oxygen saturations greater than 88 percent  >>>if unable to maintain oxygen saturations  please contact the office  >>>do not smoke with oxygen  >>>can use nasal saline gel or nasal saline rinses to moisturize nose if oxygen causes dryness    We recommend today:  No orders of the defined types were placed in this encounter.  No orders of the defined types were placed in this encounter.  Meds ordered this encounter  Medications  . predniSONE (DELTASONE) 10 MG tablet    Sig: 4 tabs for 2 days, then 3 tabs for 2 days, 2 tabs for 2 days, then 1 tab for 2 days, then stop    Dispense:  20 tablet    Refill:  0  . doxycycline (VIBRA-TABS) 100 MG tablet    Sig: Take 1 tablet (100 mg total) by mouth 2 (two) times daily.    Dispense:  14 tablet    Refill:  0    Follow Up:    Return in about 4 weeks (around 01/07/2019), or if symptoms worsen or fail to improve, for Follow up with Elisha Headland FNP-C.   Please do your part to reduce the spread of COVID-19:      Reduce your risk of any infection  and COVID19 by using the similar precautions used for avoiding the common cold or flu:  Marland Kitchen Wash your hands often with soap and warm water for at least 20 seconds.  If  soap and water are not readily available, use an alcohol-based hand sanitizer with at least 60% alcohol.  . If coughing or sneezing, cover your mouth and nose by coughing or sneezing into the elbow areas of your shirt or coat, into a tissue or into your sleeve (not your hands). Langley Gauss A MASK when in public  . Avoid shaking hands with others and consider head nods or verbal greetings only. . Avoid touching your eyes, nose, or mouth with unwashed hands.  . Avoid close contact with people who are sick. . Avoid places or events with large numbers of people in one location, like concerts or sporting events. . If you have some symptoms but not all symptoms, continue to monitor at home and seek medical attention if your symptoms worsen. . If you are having a medical emergency, call 911.   [place] / e-Visit: [email or web address]         [facility]: [facility]: [phone]                   [facility]: [phone]     It is flu season:   >>> Best ways to protect herself from the flu: Receive the yearly flu vaccine, practice good hand hygiene washing with soap and also using hand sanitizer when available, eat a nutritious meals, get adequate rest, hydrate appropriately   Please contact the office if your symptoms worsen or you have concerns that you are not improving.   Thank you for choosing  [facility] for your healthcare, and for allowing [place] to partner with you on your healthcare journey. I am thankful to be able to provide care to you today.   Wyn Quaker FNP-C

## 2018-12-10 NOTE — Progress Notes (Signed)
Virtual Visit via Telephone Note  I connected with [name] [name] on 12/10/18 at 11:30 AM EST by telephone and verified that I am speaking with the correct person using two identifiers.  Location: Patient: Home Provider: [facility] - [address], [place], [place], [place] [address]   I discussed the limitations, risks, security and privacy concerns of performing an evaluation and management service by telephone and the availability of in person appointments. I also discussed with the patient that there may be a patient responsible charge related to this service. The patient expressed understanding and agreed to proceed.  Patient consented to consult via telephone: Yes People present and their role in pt care: Pt     History of Present Illness:  73 year old female former smoker followed in our office for moderate COPD, chronic cough, irritable upper airway syndrome, chronic respiratory failure  PMH: GERD, allergic rhinitis, obesity Smoker/ Smoking History: Former smoker. Quit 1996. 60 pack year smoker.  Maintenance: Stiolto Respimat Pt of: Dr. Lamonte Sakai  Chief complaint: Wheezing, shortness of breath   73 year old female former smoker found our office for COPD.  Patient contacted our office because she is having worsened acute symptoms of wheezing and shortness of breath over the last 2 days.  She was treated telephonically on 11/14/2018 with a Z-Pak and prednisone.  She reported that her symptoms got better.  She canceled her appointment 2 weeks ago due to her symptoms improving.  Unfortunately with the weather changing again and having more damp weather she feels that her breathing has worsened.  She was evaluated on 12/09/2018 at her cardiologist and was told that she had expiratory wheezes.  This is also seen in the chart based off of the cardiologist documentation.  Unfortunately the patient has also lost her brother recently and is planning to travel up [place] due  to funeral services.  She is concerned that her breathing may worsen while she is traveling.  Observations/Objective:  09/04/2018-CBC with differential-eosinophils absolute 0.4, eosinophils relative 6  01/01/2018-chest x-ray-small lingular opacity noted which may reflect sequela of prior pneumonia  07/27/2017-echocardiogram-LV ejection fraction 60 to 54%, grade 1 diastolic dysfunction, systolic pressure within normal range  Social History   Tobacco Use  Smoking Status Former Smoker  . Packs/day: 2.00  . Years: 30.00  . Pack years: 60.00  . Quit date: 01/09/1994  . Years since quitting: 24.9  Smokeless Tobacco Never Used   Immunization History  Administered Date(s) Administered  . Influenza Whole 12/09/2008, 10/09/2009, 10/02/2010, 09/10/2011  . Influenza, High Dose Seasonal PF 10/07/2016, 10/15/2017, 08/22/2018  . Influenza,inj,Quad PF,6+ Mos 10/01/2012, 10/08/2013, 09/30/2014, 10/11/2015  . Pneumococcal Conjugate-13 10/09/2013  . Pneumococcal-Unspecified 10/02/2010  . Zoster Recombinat (Shingrix) 02/27/2018, 07/01/2018    Assessment and Plan:  Chronic respiratory failure with hypoxia ([place]) Plan: Continue 2 to 3 L pulsed via POC with physical exertion  COPD (chronic obstructive pulmonary disease) (HCC) Plan: Prednisone taper today We will send doxycycline for the patient to start if she starts having fever, body aches, chills, or productive cough with discolored mucus Continue Stiolto Respimat Close follow-up in our office in 4 weeks   Follow Up Instructions:  Return in about 4 weeks (around 01/07/2019), or if symptoms worsen or fail to improve, for Follow up with Wyn Quaker FNP-C.   I discussed the assessment and treatment plan with the patient. The patient was provided an opportunity to ask questions and all were answered. The patient agreed with the plan and demonstrated an understanding  of the instructions.   The patient was advised to call back or seek an in-person  evaluation if the symptoms worsen or if the condition fails to improve as anticipated.  I provided 28 minutes of non-face-to-face time during this encounter.   [name], NP

## 2018-12-10 NOTE — Assessment & Plan Note (Signed)
Plan: Prednisone taper today We will send doxycycline for the patient to start if she starts having fever, body aches, chills, or productive cough with discolored mucus Continue Stiolto Respimat Close follow-up in our office in 4 weeks

## 2018-12-10 NOTE — Progress Notes (Signed)
I called the patient and left a message for her. I could not connect with her to discuss iron results. She does not need IV iron therapy. This encounter was created in error - please disregard.

## 2018-12-11 ENCOUNTER — Inpatient Hospital Stay: Payer: HMO | Attending: Hematology and Oncology | Admitting: Hematology and Oncology

## 2018-12-11 DIAGNOSIS — I2601 Septic pulmonary embolism with acute cor pulmonale: Secondary | ICD-10-CM

## 2018-12-11 DIAGNOSIS — D509 Iron deficiency anemia, unspecified: Secondary | ICD-10-CM

## 2018-12-11 NOTE — Assessment & Plan Note (Signed)
With negative fecal stool Hemoccultsdetected when she went to the emergency room January 01, 2018 with hypertension  Prior treatment: IV iron January 2020 Current treatment: Oral iron supplementation.  Lab review: 06/05/2018: Hemoglobin 11.3, ferritin 44, iron saturation 9% 09/04/2018: Hemoglobin 11.6, MCV 93.7, iron saturation 15%,Ferritin 55 12/09/2018: Hemoglobin 12, MCV 92.3, ferritin 47, iron saturation 27%  Currently on Xarelto for PE. GI evaluation: Dr. Oletta Lamas  Based on the above lab results patient does not need IV iron therapy. Continue with the current oral iron therapy and return to clinic in 1 year with labs and follow-up.

## 2018-12-12 ENCOUNTER — Ambulatory Visit: Payer: HMO | Admitting: Pulmonary Disease

## 2018-12-19 ENCOUNTER — Other Ambulatory Visit: Payer: Self-pay | Admitting: Pharmacy Technician

## 2018-12-19 NOTE — Patient Outreach (Signed)
[place] [facility]) Care Management  12/19/2018  [name] [name] 1945-10-24 [id number]    Received both patient and provider portion(s) of patient assistance application(s) for Proventil HFA and Stiolto. Mailed and Faxed completed application and required documents into [employer] and [employer] respectively.  Will follow up with company(ies) in 7-14 business days to check status of application(s).  [name] [name] P. Romario Tith, [place] Management [phone]

## 2018-12-22 DIAGNOSIS — J449 Chronic obstructive pulmonary disease, unspecified: Secondary | ICD-10-CM | POA: Diagnosis not present

## 2018-12-25 ENCOUNTER — Other Ambulatory Visit: Payer: Self-pay | Admitting: Pharmacy Technician

## 2018-12-25 NOTE — Patient Outreach (Signed)
[place] [facility]) Care Management  12/25/2018  Matthew Pais 1945/07/25 [id number]  Care coordination call placed to [place] in regards to patient's application for Stiolto.  Spoke to [place] who informed patient has been DENIED due to being over income. She informed the patient can fax in the out of pocket cost for Stiolto for re consideration.  Will followup with patient's insurance HTA in regards to the OOP cost of the medication and will fax into [employer] when I am back in the office on 12/26/2018.  [place]vine Glorioso P. Tom Ragsdale, [place] Management [phone]

## 2018-12-26 ENCOUNTER — Other Ambulatory Visit: Payer: Self-pay | Admitting: Pharmacy Technician

## 2018-12-26 DIAGNOSIS — E039 Hypothyroidism, unspecified: Secondary | ICD-10-CM | POA: Diagnosis not present

## 2018-12-26 NOTE — Patient Outreach (Signed)
[place] [facility]) Care Management  12/26/2018  Crytal Pensinger [birth year] [id number]    Received out of pocket printout portion(s) of patient assistance application(s) for Stiolto. Faxed completed application and required documents into BI.  Will follow up with company(ies) in 10-14 business days to check status of application(s).  Akeria Hedstrom P. Krystl Wickware, [place] Management [phone]

## 2018-12-27 ENCOUNTER — Telehealth: Payer: Self-pay | Admitting: Hematology and Oncology

## 2018-12-27 NOTE — Telephone Encounter (Signed)
Scheduled appt per 12/17 sch message - unable to reach pt - left message with appt date and time

## 2018-12-30 ENCOUNTER — Ambulatory Visit: Payer: HMO | Admitting: Pharmacist

## 2018-12-31 ENCOUNTER — Ambulatory Visit: Payer: HMO | Attending: Internal Medicine

## 2018-12-31 ENCOUNTER — Other Ambulatory Visit: Payer: HMO

## 2018-12-31 DIAGNOSIS — Z20828 Contact with and (suspected) exposure to other viral communicable diseases: Secondary | ICD-10-CM | POA: Diagnosis not present

## 2018-12-31 DIAGNOSIS — Z20822 Contact with and (suspected) exposure to covid-19: Secondary | ICD-10-CM

## 2018-12-31 NOTE — Progress Notes (Signed)
HEMATOLOGY-ONCOLOGY MYCHART VIDEO VISIT PROGRESS NOTE  I connected with [name] [name] on 01/01/2019 at  3:30 PM EST by MyChart video conference and verified that I am speaking with the correct person using two identifiers.  I discussed the limitations, risks, security and privacy concerns of performing an evaluation and management service by MyChart and the availability of in person appointments.  I also discussed with the patient that there may be a patient responsible charge related to this service. The patient expressed understanding and agreed to proceed.  Patient's Location: Home Physician Location: Clinic  CHIEF COMPLIANT: Follow-up of iron deficiency anemia  INTERVAL HISTORY: [name] [name] is a 73 y.o. female with above-mentioned history of iron deficiency anemiapreviously treated with IV iron, B12 deficiency, and pulmonary embolism on warfarin. Labs on 12/09/18 showed: Hg 12.0, HCT 38.6, MCV 92.3, iron saturation 27%, ferritin 47. She presents over Doxim[facility] today for follow-up.   REVIEW OF SYSTEMS:   Constitutional: Denies fevers, chills or abnormal weight loss Eyes: Denies blurriness of vision Ears, nose, mouth, throat, and face: Denies mucositis or sore throat Respiratory: Denies cough, dyspnea or wheezes Cardiovascular: Denies palpitation, chest discomfort Gastrointestinal:  Denies nausea, heartburn or change in bowel habits Skin: Denies abnormal skin rashes Lymphatics: Denies new lymphadenopathy or easy bruising Neurological:Denies numbness, tingling or new weaknesses Behavioral/Psych: Mood is stable, no new changes  Extremities: No lower extremity edema Breast: denies any pain or lumps or nodules in either breasts All other systems were reviewed with the patient and are negative.  Observations/Objective:  There were no vitals filed for this visit. There is no height or weight on file to calculate BMI.  I have reviewed the data as listed CMP Latest Ref Rng  & Units 01/01/2018 07/30/2017 07/26/2017  Glucose 70 - 99 mg/dL 194(R) 740(C) 144(Y)  BUN 8 - 23 mg/dL 22 8 11   Creatinine 0.44 - 1.00 mg/dL ) 1.85(U 3.14)  Sodium 135 - 145 mmol/L 138 140 140  Potassium 3.5 - 5.1 mmol/L 4.1 4.0 4.5  Chloride 98 - 111 mmol/L 106 101 102  CO2 22 - 32 mmol/L 23 30 28   Calcium 8.9 - 10.3 mg/dL 9.70(Y) 9.2 8.9  Total Protein 6.5 - 8.1 g/dL 5.9(L) - -  Total Bilirubin 0.3 - 1.2 mg/dL 0.6 - -  Alkaline Phos 38 - 126 U/L 49 - -  AST 15 - 41 U/L 17 - -  ALT 0 - 44 U/L 11 - -    Lab Results  Component Value Date   WBC 6.9 12/09/2018   HGB 12.0 12/09/2018   HCT 38.6 12/09/2018   MCV 92.3 12/09/2018   PLT 273 12/09/2018   NEUTROABS 4.7 12/09/2018    Assessment Plan:  Iron deficiency anemia With negative fecal stool Hemoccultsdetected when she went to the emergency room January 01, 2018 with hypertension  Prior treatment: IV iron January 2020 Current treatment: Oral iron supplementation.  Lab review: 06/05/2018: Hemoglobin 11.3, ferritin 44, iron saturation 9% 09/04/2018: Hemoglobin 11.6, MCV 93.7, iron saturation 15%,Ferritin 55 12/09/2018: Hemoglobin 12, MCV 92.3, iron saturation 27%, ferritin 47  Currently on warfarin for PE. GI evaluation: Dr. 09/06/2018 There is no need of IV iron replacement therapy at this time. Continue with current treatment and follow-up in 6 months with labs done ahead of time  I discussed the assessment and treatment plan with the patient. The patient was provided an opportunity to ask questions and all were answered. The patient agreed with the plan and demonstrated an understanding of  the instructions. The patient was advised to call back or seek an in-person evaluation if the symptoms worsen or if the condition fails to improve as anticipated.   I provided 11 minutes of face-to-face MyChart video visit time during this encounter.    Rulon Eisenmenger, MD 01/01/2019   I, Molly Dorshimer, am acting as scribe for  Nicholas Lose, MD.  I have reviewed the above document for accuracy and completeness, and I agree with the above.

## 2018-12-31 NOTE — Progress Notes (Signed)
Virtual Visit via Telephone Note  I connected with [name] [name] on 01/01/19 at  9:30 AM EST by telephone and verified that I am speaking with the correct person using two identifiers.  Location: Patient: Home Provider: Office [facility] - [address], [place], [place], [place] [address]   I discussed the limitations, risks, security and privacy concerns of performing an evaluation and management service by telephone and the availability of in person appointments. I also discussed with the patient that there may be a patient responsible charge related to this service. The patient expressed understanding and agreed to proceed.  Patient consented to consult via telephone: Yes People present and their role in pt care: Pt   History of Present Illness:  73 year old female former smoker followed in our office for moderate COPD, chronic cough, irritable upper airway syndrome, chronic respiratory failure  PMH: GERD, allergic rhinitis, obesity Smoker/ Smoking History: Former smoker. Quit 1996. 60 pack year smoker.  Maintenance: Stiolto Respimat Pt of: Dr. Lamonte Sakai  Chief complaint: 4 week follow up   73 year old female former smoker on her office for COPD.  Patient was last treated empirically 4 weeks ago for COPD exacerbation prior to traveling to [place] due to recently losing her brother.  She was treated with prednisone and doxycycline.  She reports that she did struggle with her breathing in [place] likely due to the weather and that prednisone the doxycycline did help.  Symptoms have improved and she has been home which has been for about 10 days now.  Unfortunately the patient was exposed to a COVID-19 on 12/27/2018 at lunch.  She reports that she wore a mask in and out of going to lunch, but did not wear a mask when eating.  Patient has obtain Covid testing on 12/31/2018 at [facility].  Patient is awaiting those results.  Patient is aware that they will be released to  [facility].  Overall patient's breathing is stable.  She reports that she has a baseline cough.  She is afebrile.  She has no body aches, chills, loss of sense of smell or taste, GI symptoms.  Patient is aware that she should self isolate until she receives a negative Covid test.  She needs to treat her self as if she is positive as she has a Covid test pending.  We will also review the process for if she is positive the option of monoclonal antibody infusion as she would likely qualify based off her age and COPD.  She is aware that she may receive a telephone call if in fact she is positive and symptomatic.  She reports that she would be willing to receive it.  Observations/Objective:  09/04/2018-CBC with differential-eosinophils absolute 0.4, eosinophils relative 6  01/01/2018-chest x-ray-small lingular opacity noted which may reflect sequela of prior pneumonia  07/27/2017-echocardiogram-LV ejection fraction 60 to 37%, grade 1 diastolic dysfunction, systolic pressure within normal range  Social History   Tobacco Use  Smoking Status Former Smoker  . Packs/day: 2.00  . Years: 30.00  . Pack years: 60.00  . Quit date: 01/09/1994  . Years since quitting: 24.9  Smokeless Tobacco Never Used   Immunization History  Administered Date(s) Administered  . Influenza Whole 12/09/2008, 10/09/2009, 10/02/2010, 09/10/2011  . Influenza, High Dose Seasonal PF 10/07/2016, 10/15/2017, 08/22/2018  . Influenza,inj,Quad PF,6+ Mos 10/01/2012, 10/08/2013, 09/30/2014, 10/11/2015  . Pneumococcal Conjugate-13 10/09/2013  . Pneumococcal-Unspecified 10/02/2010  . Zoster Recombinat (Shingrix) 02/27/2018, 07/01/2018      Assessment and Plan:  Chronic respiratory failure with hypoxia (HCC) Plan: Continue 2 to 3 L pulsed via POC with physical exertion Documentation needs to be sent [employer]  Needs signed letter for airplane travel    COPD (chronic obstructive pulmonary disease)  (HCC) Plan: Continue Stiolto Respimat Follow-up with our office in 6 to 8 weeks  Close exposure to COVID-19 virus Exposure to COVID-19 on 12/27/2018 at lunch Patient is currently asymptomatic Patient received Covid test on 12/31/2018 at [facility] She is awaiting those results  Plan: Reviewed with the patient today the monoclonal antibody infusion, she is agreeable to receive it if she test positive and is symptomatic Reviewed with the patient she needs to treat herself as if she is positive until she receives negative testing results She should self isolate practice physical distancing wear a mask appropriate hand hygiene Emphasized to the patient that if in fact she is positive she will have closer follow-up with our office virtually.    Follow Up Instructions:  Return in about 2 months (around 03/04/2019), or if symptoms worsen or fail to improve, for Follow up with Dr. Delton Coombes, Follow up with Elisha Headland FNP-C.   I discussed the assessment and treatment plan with the patient. The patient was provided an opportunity to ask questions and all were answered. The patient agreed with the plan and demonstrated an understanding of the instructions.   The patient was advised to call back or seek an in-person evaluation if the symptoms worsen or if the condition fails to improve as anticipated.  I provided 24 minutes of non-face-to-face time during this encounter.   [name], NP

## 2019-01-01 ENCOUNTER — Ambulatory Visit (INDEPENDENT_AMBULATORY_CARE_PROVIDER_SITE_OTHER): Payer: HMO | Admitting: Pulmonary Disease

## 2019-01-01 ENCOUNTER — Inpatient Hospital Stay (HOSPITAL_BASED_OUTPATIENT_CLINIC_OR_DEPARTMENT_OTHER): Payer: HMO | Admitting: Hematology and Oncology

## 2019-01-01 ENCOUNTER — Other Ambulatory Visit: Payer: Self-pay

## 2019-01-01 ENCOUNTER — Encounter: Payer: Self-pay | Admitting: Pulmonary Disease

## 2019-01-01 ENCOUNTER — Ambulatory Visit: Payer: HMO | Admitting: Pulmonary Disease

## 2019-01-01 DIAGNOSIS — D509 Iron deficiency anemia, unspecified: Secondary | ICD-10-CM

## 2019-01-01 DIAGNOSIS — Z20828 Contact with and (suspected) exposure to other viral communicable diseases: Secondary | ICD-10-CM

## 2019-01-01 DIAGNOSIS — Z20822 Contact with and (suspected) exposure to covid-19: Secondary | ICD-10-CM

## 2019-01-01 DIAGNOSIS — J9611 Chronic respiratory failure with hypoxia: Secondary | ICD-10-CM | POA: Diagnosis not present

## 2019-01-01 DIAGNOSIS — J449 Chronic obstructive pulmonary disease, unspecified: Secondary | ICD-10-CM

## 2019-01-01 NOTE — Assessment & Plan Note (Signed)
With negative fecal stool Hemoccultsdetected when she went to the emergency room January 01, 2018 with hypertension  Prior treatment: IV iron January 2020 Current treatment: Oral iron supplementation.  Lab review: 06/05/2018: Hemoglobin 11.3, ferritin 44, iron saturation 9% 09/04/2018: Hemoglobin 11.6, MCV 93.7, iron saturation 15%,Ferritin 55 12/09/2018: Hemoglobin 12, MCV 92.3, iron saturation 27%, ferritin 47  Currently on Xarelto for PE. GI evaluation: Dr. Oletta Lamas There is no need of IV iron replacement therapy at this time. Continue with current treatment and follow-up in 6 months with labs done ahead of time

## 2019-01-01 NOTE — Patient Instructions (Addendum)
You were seen today by [name], NP  for:   1. Chronic respiratory failure with hypoxia (HCC)  Continue oxygen therapy as prescribed  >>>maintain oxygen saturations greater than 88 percent  >>>if unable to maintain oxygen saturations please contact the office  >>>do not smoke with oxygen  >>>can use nasal saline gel or nasal saline rinses to moisturize nose if oxygen causes dryness  Letter to be signed today for airport use and travel with oxygen  Documentation sent to [facility] documenting that you do still need oxygen with physical exertion  2. Close exposure to COVID-19 virus  Await your Covid testing results Practice social distancing Practice social isolation Practice hand hygiene  If in fact you are positive for COVID-19 as well as symptomatic you would be a candidate for the monoclonal antibody infusion, you may receive a telephone call regarding this  If you are positive we will need to have close follow-up with our office virtually   3. Chronic obstructive pulmonary disease, unspecified COPD type (HCC)  Stiolto Respimat inhaler >>>2 puffs daily >>>Take this no matter what >>>This is not a rescue inhaler  Note your daily symptoms > remember "red flags" for COPD:   >>>Increase in cough >>>increase in sputum production >>>increase in shortness of breath or activity  intolerance.   If you notice these symptoms, please call the office to be seen.     Follow Up:    Return in about 2 months (around 03/04/2019), or if symptoms worsen or fail to improve, for Follow up with Dr. Delton Coombes, Follow up with Elisha Headland FNP-C.   Please do your part to reduce the spread of COVID-19:      Reduce your risk of any infection  and COVID19 by using the similar precautions used for avoiding the common cold or flu:  Marland Kitchen Wash your hands often with soap and warm water for at least 20 seconds.  If soap and water are not readily available, use an alcohol-based hand sanitizer  with at least 60% alcohol.  . If coughing or sneezing, cover your mouth and nose by coughing or sneezing into the elbow areas of your shirt or coat, into a tissue or into your sleeve (not your hands). Drinda Butts A MASK when in public  . Avoid shaking hands with others and consider head nods or verbal greetings only. . Avoid touching your eyes, nose, or mouth with unwashed hands.  . Avoid close contact with people who are sick. . Avoid places or events with large numbers of people in one location, like concerts or sporting events. . If you have some symptoms but not all symptoms, continue to monitor at home and seek medical attention if your symptoms worsen. . If you are having a medical emergency, call 911.   ADDITIONAL HEALTHCARE OPTIONS FOR PATIENTS  [facility] / e-Visit: [email or web address]         [facility]: [phone]  [facility]: [phone]                   [facility]: [phone]     It is flu season:   >>> Best ways to protect herself from the flu: Receive the yearly flu vaccine, practice good hand hygiene washing with soap and also using hand sanitizer when available, eat a nutritious meals, get adequate rest, hydrate appropriately   Please contact the office if your symptoms worsen or you have concerns that you are not improving.  Thank you for choosing [facility] for your healthcare, and for allowing [place] to partner with you on your healthcare journey. I am thankful to be able to provide care to you today.   Wyn Quaker FNP-C   This information is directly available on the CDC website: RunningShows.co.za.html    Source:CDC Reference to specific commercial products, manufacturers, companies, or trademarks does not constitute its endorsement or recommendation by the [place], [place], or Centers for [employer] and Prevention.

## 2019-01-01 NOTE — Assessment & Plan Note (Signed)
Plan: Continue Stiolto Respimat Follow-up with our office in 6 to 8 weeks

## 2019-01-01 NOTE — Assessment & Plan Note (Addendum)
Exposure to COVID-19 on 12/27/2018 at lunch Patient is currently asymptomatic Patient received Covid test on 12/31/2018 at [facility] She is awaiting those results  Plan: Reviewed with the patient today the monoclonal antibody infusion, she is agreeable to receive it if she test positive and is symptomatic Reviewed with the patient she needs to treat herself as if she is positive until she receives negative testing results She should self isolate practice physical distancing wear a mask appropriate hand hygiene Emphasized to the patient that if in fact she is positive she will have closer follow-up with our office virtually.

## 2019-01-01 NOTE — Assessment & Plan Note (Signed)
Plan: Continue 2 to 3 L pulsed via POC with physical exertion Documentation needs to be sent [employer]  Needs signed letter for airplane travel

## 2019-01-02 LAB — NOVEL CORONAVIRUS, NAA: SARS-CoV-2, NAA: NOT DETECTED

## 2019-01-07 ENCOUNTER — Other Ambulatory Visit: Payer: Self-pay | Admitting: Pharmacy Technician

## 2019-01-07 NOTE — Patient Outreach (Signed)
[facility]) Care Management  01/07/2019  [name] [name] 20-Jul-1945 [id number]    ADDENDUM  Successful outreach call placed to patient in regards to [facility] application for Stiolto.  Spoke to patient, HIPAA identifiers verified.  Informed patient she was approved for calendar year 2021 for the [place]. Informed patient to continue to call her refills in as she needs them just like she has this past year. Patient verbalized understanding.  Informed patient I would be in touch once I hear from [employer] about her enrollment.  [name] [name], [place] Management [phone]

## 2019-01-07 NOTE — Patient Outreach (Signed)
[place] [facility]) Care Management  01/07/2019  [name] [name] 12-20-1945 [id number]    Care coordination call placed to BI in regards to patient's Stiolto application.  Spoke to [place] who informed patient has been APPROVED 01/10/2019-01/09/2020. Patient can continue to call in as she needs refills just as she has this past year per Elle.  Will update patient with this information.  Abisai Deer P. Lacey Dotson, [place] Management [phone]

## 2019-01-09 ENCOUNTER — Other Ambulatory Visit: Payer: Self-pay | Admitting: Pharmacy Technician

## 2019-01-09 NOTE — Patient Outreach (Signed)
[place] [facility]) Care Management  01/09/2019  Lauran Romanski May 24, 1945 [id number]  Care coordination call placed to [employer] in regards to patient's application for Proventil HFA.   Spoke to [place] who informed they have not received the application that was mailed to [employer] on 12/19/2018. Liliane Channel informed that it can take 2-3 weeks for them to receive the application and another 5 business days to upload it in the system. Liliane Channel suggested to try back late next week around the 8th of January 2021.  Will followup with [employer] in 5-10 business days to inquire about status of application.  Comer Devins P. Mazikeen Hehn, [place] Management [phone]

## 2019-01-17 ENCOUNTER — Other Ambulatory Visit: Payer: Self-pay | Admitting: Pharmacy Technician

## 2019-01-17 NOTE — Patient Outreach (Signed)
[facility]) Care Management  01/17/2019  [name] [name] 1946-01-09 [id number]   Care coordination call placed to [employer] in regards to patient's application for Proventil HFA.  Spoke to [name] who informed they have not received the application that was mailed to them on December 10. She informed they were closed from Dec 23-27, Dec 31 and Jan 1-3. She informed that it can take up to 30 days for them to receive and process an application with added days for holidays.  Will follow up with [employer] in 10-14 business days  To inquire if application was received.  [name] P. Clorissa Gruenberg, CPhT [facility] Care Management [phone]

## 2019-01-22 DIAGNOSIS — J449 Chronic obstructive pulmonary disease, unspecified: Secondary | ICD-10-CM | POA: Diagnosis not present

## 2019-02-03 ENCOUNTER — Other Ambulatory Visit: Payer: Self-pay

## 2019-02-03 NOTE — Patient Outreach (Signed)
[facility]) Care Management Chronic Special Needs Program  02/03/2019  Name: [name] [name] DOB: 1945/03/06  MRN: [id number]  [name] [name] is enrolled in a chronic special needs plan for Diabetes. Chronic Care Management Coordinator telephoned client to review health risk assessment and to develop individualized care plan.  Reviewed the chronic care management program, importance of client participation, and taking their care plan to all provider appointments and inpatient facilities.  Reviewed the transition of care process and possible referral to community care management.  Subjective:Client states that she has been doing good.  States her breathing is about the same and she is using her oxygen as needed when she is more active. States her blood sugars usually range from 70-153 in the morning.  States the higher readings are usually on Mondays as she goes out to eat after church on Sundays.  States she does watch the CHO and salt in her diet.  States she is to see her primary care doctor next month for her physical.  States she has her name on the waiting list to get the COVID vaccination.  Denies any chest pains or any heart problems.  States she can afford her medications since she is getting the help from the drug companies  Goals Addressed            This Visit's Progress   . Client understands the importance of follow-up with providers by attending scheduled visits   On track    Plan to keep scheduled appointments with providers    . Client will report no worsening of symptoms related to heart disease within the next 6 months (continue 02/03/19)   On track    Plan to call doctor for any worsening of heart disease    . Client will use [employer] as needed and verbalize understanding of device use   On track    Using glucometer without issue Call [employer] at [phone] or [employer]at to order a pulse oximeter with your OTC benefit     .  Client will verbalize knowledge of chronic lung disease as evidenced by no ED visits or Inpatient stays related to chronic lung disease (continue 02/03/19)   On track    Plan to call your pulmonologist for any worsening of your breathing No hospitalizations or ED visits for COPD last year    . Client will verbalize knowledge of self management of Hypertension as evidences by BP reading of 140/90 or less; or as defined by provider   On track    Reports B/P below 140/90 when checked    . HEMOGLOBIN A1C < 7.0       Last Hemoglobin A1C 6.5% on 10/08/19 Diabetes self management actions:  Glucose monitoring per provider recommendations  Eat Healthy  Check feet daily  Visit provider every 3-6 months as directed  Hbg A1C level every 3-6 months.  Eye Exam yearly    . Maintain timely refills of diabetic medication as prescribed within the year .   On track    Maintaining timely refills per record    . Obtain annual  Lipid Profile, LDL-C   On track    Completed 01/29/2018 Plan to keep scheduled follow up with your doctor    . Obtain Annual Eye (retinal)  Exam    On track    Reports completed in July Plan to schedule annual eye exam     . Obtain Annual Foot Exam   On track    Completed  01/29/2018 Plan to keep scheduled follow up with your doctor    . Obtain annual screen for micro albuminuria (urine) , nephropathy (kidney problems)   On track    Completed 01/29/2018 Plan to keep scheduled follow up with your doctor    . Obtain Hemoglobin A1C at least 2 times per year   On track    Completed 01/29/2018, 10/08/18 Plan to keep scheduled follow up with your doctor    . Visit Primary Care Provider or Endocrinologist at least 2 times per year    On track    Completed 02/06/2018, 9/29/220 Plan to keep scheduled follow up with your doctor     Client is meeting diabetes self management goal of hemoglobin A1C of <7% with last reading of 6.5% on 10/08/18 Client is active with [facility] for pharmacy  assistance Reviewed following a low CHO low sodium diet and to watch her portion sizes especially when eating out Encouraged to remain as active as she can and to use her oxygen as directed Given information on how to order from the OTC catalog and to look into getting a pulse oximeter through the catalog Reinforced to keep her appointments with her primary care provider and her other specialist Reviewed number for 24-hour [place] precautions Encouraged to get her [place] vaccination when she is given appointment  Plan:  Send successful outreach letter with a copy of their individualized care plan, Send individual care plan to provider and Oncologist [employer] calendar  Chronic care management coordination will outreach in:  6 Months     [place], [place], [place] Management Coordinator [place] Management [phone]

## 2019-02-05 ENCOUNTER — Other Ambulatory Visit: Payer: Self-pay | Admitting: Pharmacy Technician

## 2019-02-05 NOTE — Patient Outreach (Signed)
[facility]) Care Management  02/05/2019  [name] [name] [birth year] [id number]  Care coordination call placed to [employer] in regards to patient's application for Proventil HFA.  Spoke to [name] who informed they have not received the attestaiton form that was mailed in on 12/19/2018. She informed the are experiencing significant processing delays. She informed to check back next week.  Will follow up with [employer] in 5-7 business days.  Meshell Abdulaziz P. Marnie Fazzino, CPhT [facility] Care Management [phone]

## 2019-02-12 ENCOUNTER — Other Ambulatory Visit: Payer: Self-pay | Admitting: Pharmacy Technician

## 2019-02-12 NOTE — Patient Outreach (Signed)
[facility]) Care Management  02/12/2019  [name] [name] [birth year] [id number]  Care coordination call placed to [employer] in regards to patient's application for Proventil HFA.  Spoke to [place] who informed they have not received the application that was mailed to them on 12/19/2018.   She suggested to either have patient reapply or if a copy of the application was made, then that could be mailed to [employer] patient assistance program at [employer], [place], [place] [phone] or it could be sent over night mail to the physical address which is [address], [place], [place] [address].  Will follow up with [employer] in 7-10 business days to inquire about status of application.  [name] P. [name], CPhT [facility] Management [phone]

## 2019-02-21 ENCOUNTER — Other Ambulatory Visit: Payer: Self-pay | Admitting: Pharmacy Technician

## 2019-02-21 NOTE — Patient Outreach (Signed)
[facility]) Care Management  02/21/2019  [name] [name] 04-Mar-1945 [id number]   ADDENDUM   Unsuccessful call placed to patient regarding patient assistance receipt of attestation form from [employer] for Proventil HFA, HIPAA compliant voicemail left.   Was calling to inform patient that [employer] mailed her the attestation form on 02/15/2019.  Follow up:  Will follow up with patient in 5-10 business days if call is not returned.  [name] P. Dmauri Rosenow, CPhT [facility] Care Management [phone]

## 2019-02-21 NOTE — Patient Outreach (Signed)
[facility]) Care Management  02/21/2019  Dorisann Schwanke 1946-01-02 [id number]    Care coordination call placed to [employer] in regards to patient's application for Proventil HFA.  Spoke to [place] who informed they received the application on 02/14/2019. She informed the attestation form was mailed to the patient on 02/15/2019. Once the attestation form is received then the application processing can continue.  Will outreach patient that the attestation form has been mailed.  Gladine Plude P. Jullie Arps, CPhT Musician Care Management [phone]

## 2019-02-22 DIAGNOSIS — J449 Chronic obstructive pulmonary disease, unspecified: Secondary | ICD-10-CM | POA: Diagnosis not present

## 2019-02-22 IMAGING — CT CT ANGIO CHEST
2 of 8 series · 18 of 36 positions shown · IV contrast (OMNI)
Comparison: 04/08/2016 CXR, cardiac CT 02/06/2017

CLINICAL DATA: [place] the right forehead and possible pneumonia.
Known witnessed syncopal episode last evening.

EXAM:
CT ANGIOGRAPHY CHEST WITH CONTRAST
TECHNIQUE: Multidetector CT imaging of the chest was performed using the
standard protocol during bolus administration of intravenous
contrast. Multiplanar CT image reconstructions and MIPs were
obtained to evaluate the vascular anatomy.
CONTRAST:  60mL H7AGZC-MZY IOPAMIDOL (H7AGZC-MZY) INJECTION 76%

[Series 5: thins · axial · 0.76mm/px · z∈[-514,-242]mm · 17 of 305 slices shown]
[im 17/305  lung]
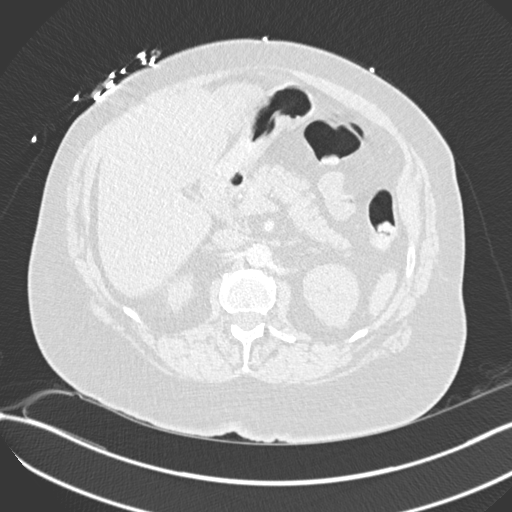
[im 33/305  mediastinal]
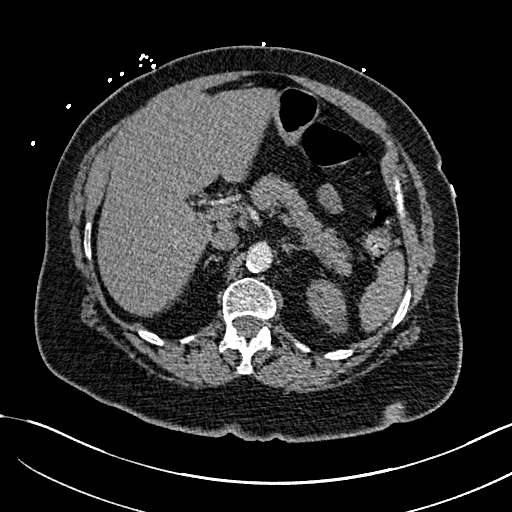
[im 49/305  lung]
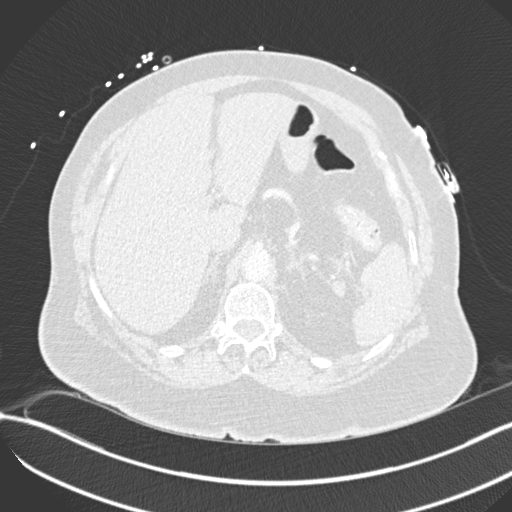
[im 65/305  mediastinal]
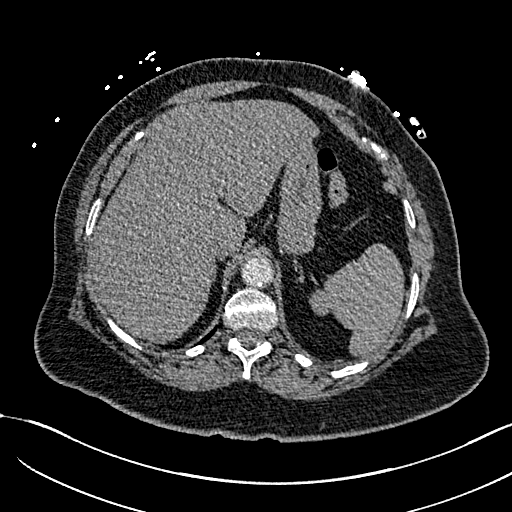
[im 81/305  lung]
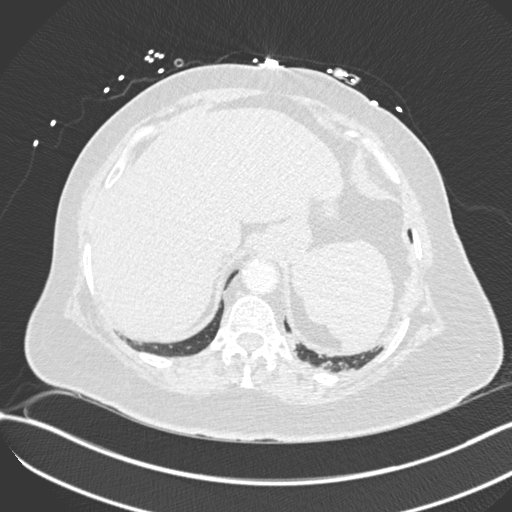
[im 97/305  mediastinal]
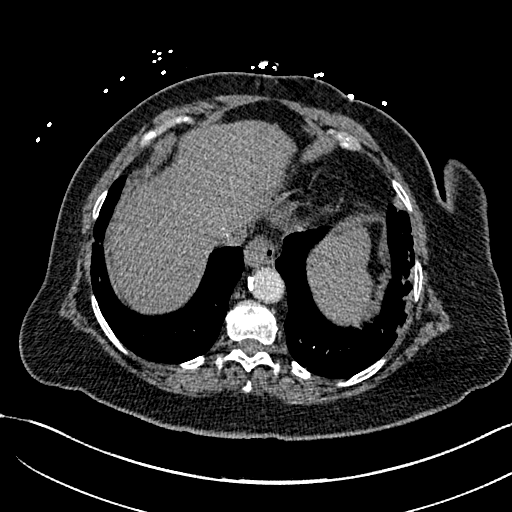
[im 113/305  lung]
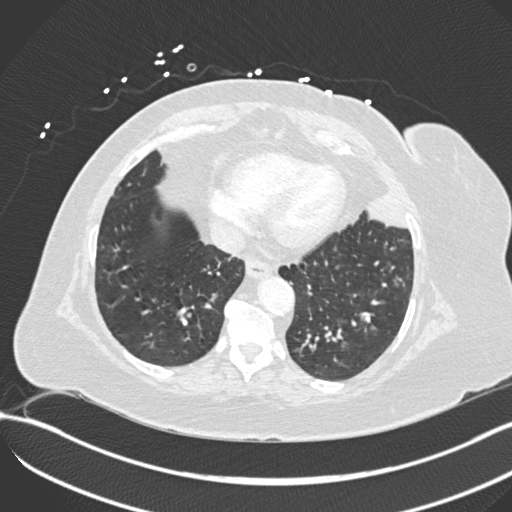
[im 129/305  mediastinal]
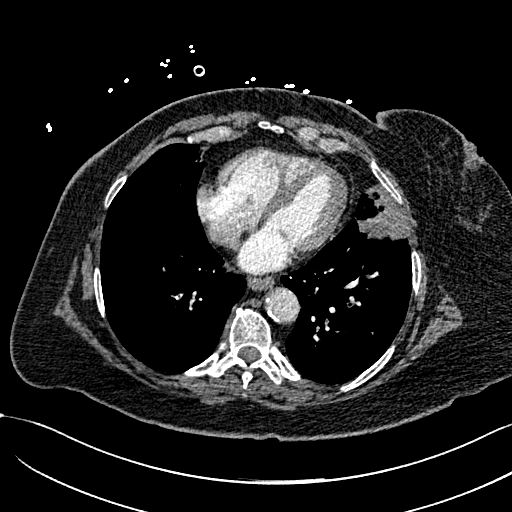
[im 161/305  lung]
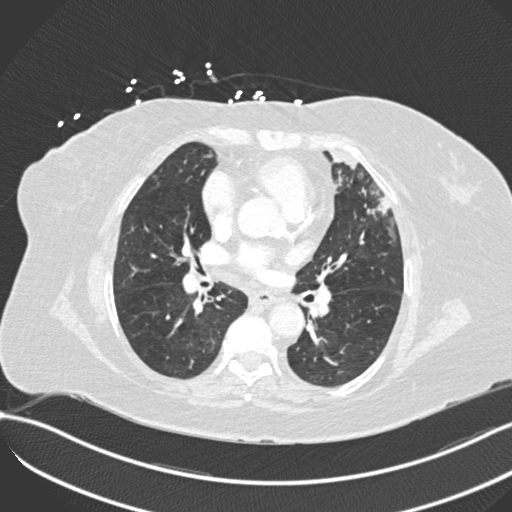
[im 177/305  mediastinal]
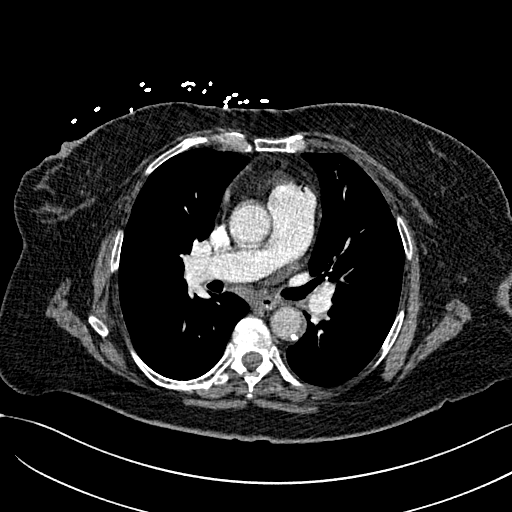
[im 193/305  lung]
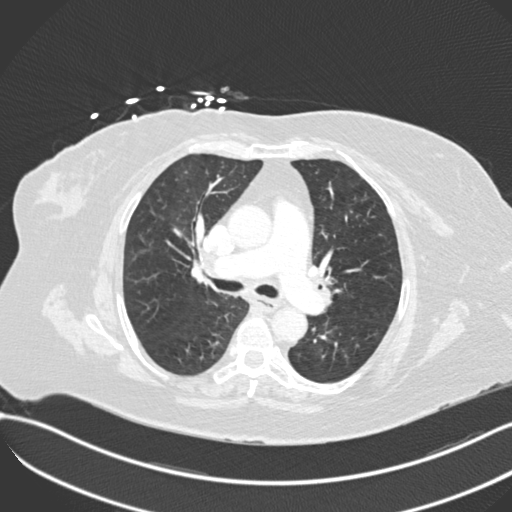
[im 209/305  mediastinal]
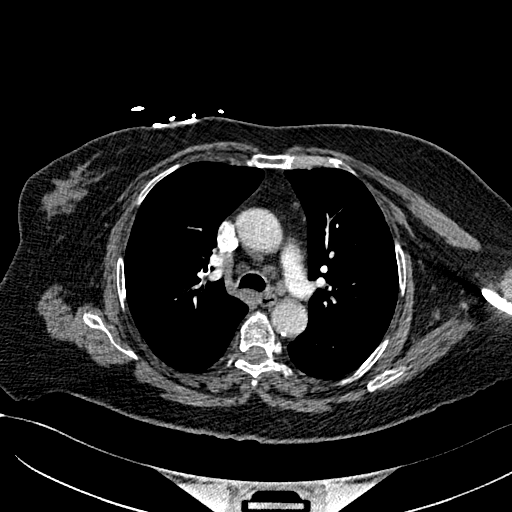
[im 225/305  lung]
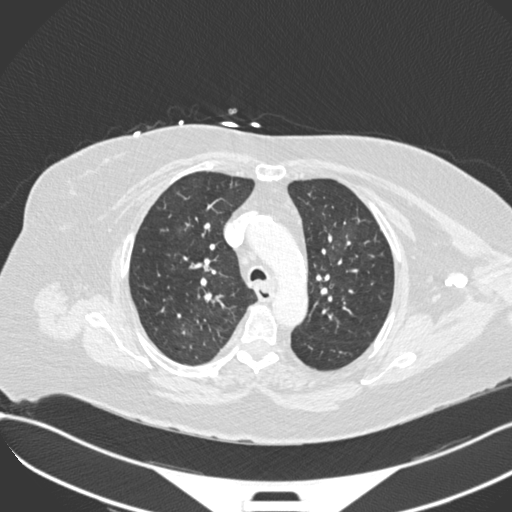
[im 241/305  mediastinal]
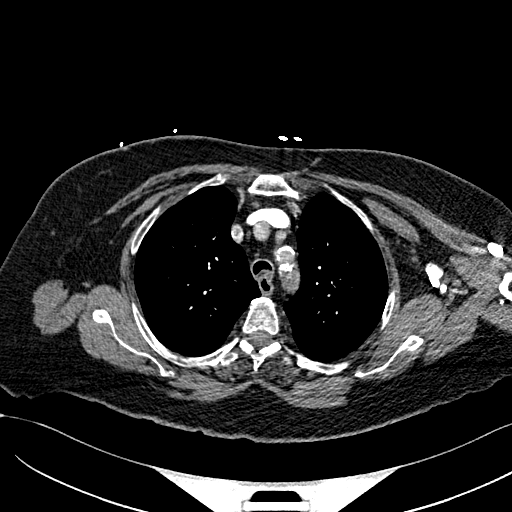
[im 257/305  lung]
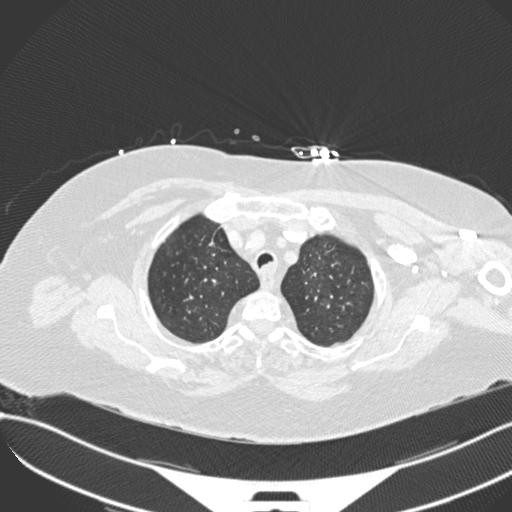
[im 273/305  mediastinal]
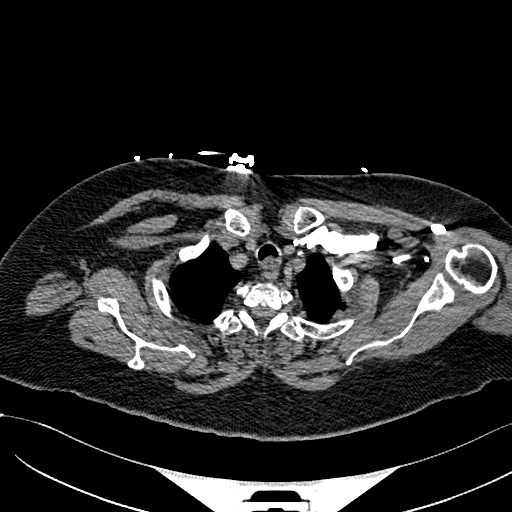
[im 289/305  lung]
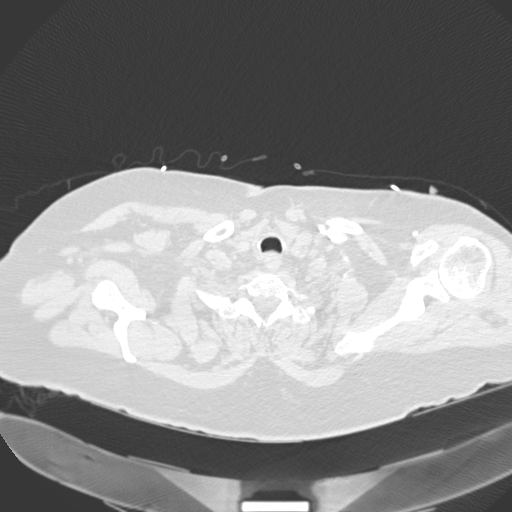

[Series 7: coronal mpr · coronal · 0.61mm/px · 1 of 151 slices shown]
[im 76/151  mediastinal]
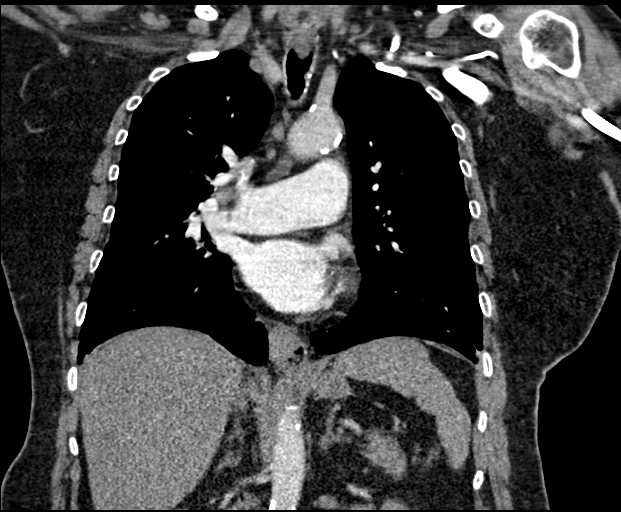

[18 of 36 positions shown; findings below may reference images not displayed]

FINDINGS: Cardiovascular: Conventional branch pattern of the great vessels
with atherosclerosis. Preferential opacification of the pulmonary
arterial system, adequate for assessment to the level of the distal
lobar and segmental arteries. Small pulmonary emboli to the lower
lobes are noted within the distal lobar and proximal segmental
pulmonary arteries. RV/LV ratio 1. No pericardial effusion.
Atherosclerotic nonaneurysmal thoracic aorta. No dissection. Left
main and three-vessel coronary arteriosclerosis.

Mediastinum/Nodes: Small cluster of mildly enlarged left lower
paratracheal lymph nodes measuring up to 13 mm short axis likely
reactive. Small bilateral hilar lymph nodes without pathologic
enlargement. Esophagus, trachea and mainstem bronchi are
unremarkable. There is mild lower lobe peribronchial thickening.

Lungs/Pleura: Pulmonary consolidation noted in the lingula
suspicious for pneumonia and/or atelectasis. No effusion or
pneumothorax. No dominant mass. Bilateral tree-in-bud like opacities
in the lower lobes consistent with bronchiolitis. Subsegmental
atelectasis in the periphery of the left lower lobe.

Upper Abdomen: No acute abnormality.

Musculoskeletal: No chest wall abnormality. No acute or significant
osseous findings.

Review of the MIP images confirms the above findings.
IMPRESSION: 1. Bilateral lower lobe pulmonary emboli with mild right heart
strain, RV/LV ratio 1. Positive for acute PE with CT evidence of
right heart strain (RV/LV Ratio = 1) consistent with at least
submassive (intermediate risk) PE. The presence of right heart
strain has been associated with an increased risk of morbidity and
mortality. Please activate Code PE by paging [phone]. These
results were called by telephone at the time of interpretation on
07/26/2017 at [DATE] to Dr. MAY WOHL , who verbally
acknowledged these results.
2. Nonaneurysmal atherosclerotic aorta without dissection.
3. Left main and three-vessel coronary arteriosclerosis.
4. Pulmonary consolidation in the lingula suspicious for pneumonia
with reactive mediastinal adenopathy.
5. Tree-in-bud opacities in the lower lobes be keeping with
bronchiolitis.

Aortic Atherosclerosis ([id number]-[id number].C).

## 2019-02-25 ENCOUNTER — Other Ambulatory Visit: Payer: Self-pay | Admitting: Pharmacist

## 2019-02-25 NOTE — Patient Outreach (Signed)
[place] [facility]) Care Management  [place]   02/25/2019  [name] [name] 1945/12/13 [id number]  Reason for referral: medication asssistance  Referral source: 2021 Med Assistance Eval Referral medication(s): Proventil, Stiolto Current insurance:HTA CSNP  PMHx:  Patient is a 74 year old female with multiple medical conditions including but not limited to:  Allergic rhinitis, CAD, COPD, cough, depression, dyslipidemia, hypertension, type 2 diabetes, and  history of Pulmonary Embolism. Patient was assisted with medication assistance last year for 2020 and already completed her forms for 2021 with [employer].  Objective: Allergies  Allergen Reactions  . Lisinopril     cough  . Sulfonamide Derivatives Rash    Medications Reviewed Today    Reviewed by Elayne Guerin, [facility] (Pharmacist) on 02/25/19 at 1022  Med List Status: <None>  Medication Order Taking? Sig Documenting Provider Last Dose Status Informant  albuterol (PROAIR HFA) 108 (90 Base) MCG/ACT inhaler [id number] Yes INHALE 2 PUFFS INTO THE LUNGS EVERY 6 (SIX) HOURS AS NEEDED FOR WHEEZING OR SHORTNESS OF BREATH.  Patient taking differently: Inhale 2 puffs into the lungs every 6 (six) hours as needed for wheezing or shortness of breath.    Collene Gobble, MD Taking Active Self  albuterol (PROVENTIL) (2.5 MG/3ML) 0.083% nebulizer solution [id number] Yes Take 3 mLs (2.5 mg total) by nebulization every 4 (four) hours as needed for wheezing or shortness of breath (DX: COPD 496). Collene Gobble, MD Taking Active Self  Ascorbic Acid (VITAMIN C) 1000 MG tablet [id number] Yes Take 1,000 mg by mouth daily.   [provider] Taking Active Self  aspirin 81 MG chewable tablet [id number] Yes Chew 81 mg by mouth at bedtime.  [provider] Taking Active Self  atenolol (TENORMIN) 50 MG tablet [id number] Yes Take 50 mg by mouth daily.   [provider] Taking Active Self  atorvastatin (LIPITOR) 20 MG  tablet [id number] Yes Take 20 mg by mouth daily.   [provider] Taking Active Self  Calcium Carbonate-Vitamin D (CALTRATE 600+D) 600-400 MG-UNIT per tablet [id number] Yes Take 2 tablets by mouth daily.   [provider] Taking Active Self  ferrous sulfate 325 (65 FE) MG tablet [id number] Yes Take 325 mg by mouth daily. [provider] Taking Active   fexofenadine (ALLEGRA) 180 MG tablet [id number] Yes Take 180 mg by mouth daily. [provider] Taking Active Self  fluticasone (FLONASE) 50 MCG/ACT nasal spray [id number] Yes SPRAY 2 SPRAYS INTO EACH NOSTRIL EVERY DAY Byrum, Rose Fillers, MD Taking Active   levothyroxine (SYNTHROID) 88 MCG tablet [id number] Yes Take 88 mcg by mouth daily. [provider] Taking Active   losartan (COZAAR) 25 MG tablet [id number] Yes Take 25 mg by mouth daily. [provider] Taking Active Self  metFORMIN (GLUCOPHAGE-XR) 500 MG 24 hr tablet [id number] Yes Take 500 mg by mouth daily. [provider] Taking Active   montelukast (SINGULAIR) 10 MG tablet [id number] Yes Take 1 tablet (10 mg total) by mouth at bedtime. Collene Gobble, MD Taking Active   Multiple Vitamins-Minerals (CENTRUM PO) [id number] Yes Take 1 tablet by mouth daily.   [provider] Taking Active Self  nitroGLYCERIN (NITROSTAT) 0.4 MG SL tablet [id number]  Place 1 tablet (0.4 mg total) under the tongue every 5 (five) minutes as needed for chest pain. Erlene Quan, [place]  Expired 01/01/18 2359 Self  omeprazole (PRILOSEC) 20 MG capsule [id number] Yes TAKE 1 CAPSULE (20 MG TOTAL) BY MOUTH 2 (TWO) TIMES DAILY BEFORE A  MEAL. Leslye Peer, MD Taking Active   ONE TOUCH ULTRA TEST test strip [id number] Yes As directed [provider] Taking Active Self  Tiotropium Bromide-Olodaterol (STIOLTO RESPIMAT) 2.5-2.5 MCG/ACT AERS [id number] Yes Inhale 2 puffs into the lungs daily. Leslye Peer, MD Taking Active   traZODone (DESYREL) 50 MG tablet [id number]  Yes Take 50 mg by mouth at bedtime.  [provider] Taking Active Self           Med Note (COX, HEATHER C   Tue Dec 05, 2016  9:04 AM)    venlafaxine XR (EFFEXOR-XR) 150 MG 24 hr capsule [id number] Yes Take 150 mg by mouth daily.  [provider] Taking Active Self  warfarin (COUMADIN) 5 MG tablet [id number] Yes Take 5 mg by mouth daily. [provider] Taking Active           Assessment:  Medication Review Findings:  . HgA1c-6.7% 11/29/18 (Dr. Leonides Sake Note) o On atorvastatin- last filled 02/18/19 for a 90 day supply  Medication Assistance Findings:  Medication assistance needs identified: has already received forms for 2021. Will mail attestation form back today  Additional medication assistance options reviewed with patient as warranted:  No other options identified  Plan: Noreene Larsson Simcox will follow patient. Follow up in 6-8 weeks.  [name], PharmD, BCACP [facility] Clinical Pharmacist [phone]

## 2019-02-26 DIAGNOSIS — F320 Major depressive disorder, single episode, mild: Secondary | ICD-10-CM | POA: Diagnosis not present

## 2019-02-26 DIAGNOSIS — Z0000 Encounter for general adult medical examination without abnormal findings: Secondary | ICD-10-CM | POA: Diagnosis not present

## 2019-02-26 DIAGNOSIS — D509 Iron deficiency anemia, unspecified: Secondary | ICD-10-CM | POA: Diagnosis not present

## 2019-02-26 DIAGNOSIS — I10 Essential (primary) hypertension: Secondary | ICD-10-CM | POA: Diagnosis not present

## 2019-02-26 DIAGNOSIS — E1121 Type 2 diabetes mellitus with diabetic nephropathy: Secondary | ICD-10-CM | POA: Diagnosis not present

## 2019-02-26 DIAGNOSIS — J449 Chronic obstructive pulmonary disease, unspecified: Secondary | ICD-10-CM | POA: Diagnosis not present

## 2019-02-26 DIAGNOSIS — Z9981 Dependence on supplemental oxygen: Secondary | ICD-10-CM | POA: Diagnosis not present

## 2019-02-26 DIAGNOSIS — N183 Chronic kidney disease, stage 3 unspecified: Secondary | ICD-10-CM | POA: Diagnosis not present

## 2019-02-26 DIAGNOSIS — E039 Hypothyroidism, unspecified: Secondary | ICD-10-CM | POA: Diagnosis not present

## 2019-02-26 DIAGNOSIS — E785 Hyperlipidemia, unspecified: Secondary | ICD-10-CM | POA: Diagnosis not present

## 2019-03-03 ENCOUNTER — Other Ambulatory Visit: Payer: Self-pay | Admitting: Pharmacy Technician

## 2019-03-03 NOTE — Patient Outreach (Signed)
[facility]) Care Management  03/03/2019  [name] [name] 01-Jan-1946 [id number]    Successful call placed to patient regarding patient assistance receipt of attestation form from [employer] for Proventil HFA, HIPAA identifiers verified.   Patient informed she received the attestation form, filled in out and mailed it back into [employer]. She took the envelope to the postal service on 02/24/2019.  Follow up:  Will follow up with [employer] in 10-14 business days to inquire if attestation form was received.  Louretta Tantillo P. Ben Sanz, CPhT Musician Care Management [phone]

## 2019-03-14 ENCOUNTER — Other Ambulatory Visit: Payer: Self-pay | Admitting: Pharmacy Technician

## 2019-03-14 NOTE — Patient Outreach (Signed)
[facility]) Care Management  03/14/2019  Pauline Pegues 10-23-1945 [id number]    ADDENDUM   Unsuccessful call placed to patient regarding patient assistance medication delivery of Proventil HFA with [employer], HIPAA compliant voicemail left.   Was calling patient to inquire if she received the medication that was delivered on 3/2  Follow up:  Will follow up with patient in 5-7 business days if call is not returned.  Cyncere Ruhe P. Salimata Christenson, CPhT [facility]  [phone]

## 2019-03-14 NOTE — Patient Outreach (Signed)
[facility]) Care Management  03/14/2019  [name] [name] 1945-08-05 [id number]   ADDENDUM   Incoming call from patient regarding patient assistance medication delivery of Proventil HFA from [employer], HIPAA identifiers verified.   Patient informed she received 3 inhalers. Discussed refill procedure with patient and informed her that [employer] is now using a new pharmacy called [facility]. Patient was able to locate the pharmacy name, phone number and rx number on her inhaler box. Patient verbalized understanding in placing her refill 2 weeks before running out to avoid a delay in therapy. Patient informed she has not other questions or concerns. Confirmed patient had name and number.  Follow up:  Will route note to [facility] RPh Nunzio Cobbs for case closure as patient assistance is complete and will remove myself from care team.  Stacie Acres. Jonette Wassel, CPhT [facility]  [phone]

## 2019-03-14 NOTE — Patient Outreach (Signed)
[facility]) Care Management  03/14/2019  [name] [name] 05/19/1945 [id number]  Care coordination call placed to [employer] in regards to patient's Proventil application.  Spoke to Angie who informed patient was APPROVED 03/04/2019. She informed the pharmacy received the requested and filled the prescription on 03/07/2019. She informed the medication was delivered on 03/11/2019 and left at the rear door.  Will outreach patient with this information.  [name] P. Franki Alcaide, CPhT [facility]  [phone]

## 2019-03-18 ENCOUNTER — Other Ambulatory Visit: Payer: Self-pay | Admitting: Emergency Medicine

## 2019-03-22 DIAGNOSIS — J449 Chronic obstructive pulmonary disease, unspecified: Secondary | ICD-10-CM | POA: Diagnosis not present

## 2019-03-27 ENCOUNTER — Telehealth: Payer: Self-pay | Admitting: Pharmacist

## 2019-03-27 NOTE — Patient Outreach (Addendum)
[facility]) Care Management  03/27/2019  [name] [name] 09/18/1945 [id number]   Received a message from [facility] CMA stating the patient was having issues reordering her Stiolto.   Patient was called. HIPAA identifiers were obtained. Patient said when she tried to refill her Stiolto through [facility] the system told her the prescription did not exist.  Called [facility] [employer] with the patient on conference call. Spoke to a representative who correctly processed a refill for the patient. Stiolto will be delivered to the patient in 5-7 business days to her home.  The representative said the patient should be able to use the automated system from this point on but was unable to previously because the prescription number changed with the new year. Patient is approved through 01/09/2020.   [name], PharmD, BCACP [facility] Clinical Pharmacist [phone]   ADDENDUM  [facility])  [facility] Quality Pharmacy Team    [facility] pharmacy case will be closed as our team is transitioning from the [facility] Care Management Department into the [facility] Quality Department and will no longer be using [facility] for documentation purposes.   [facility] pharmacy technician will continue to assist patient with medication assistance program applications until complete.      [name], PharmD, BCACP [facility] Clinical Pharmacist [phone]

## 2019-04-16 ENCOUNTER — Other Ambulatory Visit: Payer: Self-pay

## 2019-04-16 ENCOUNTER — Ambulatory Visit: Payer: HMO | Admitting: Emergency Medicine

## 2019-04-16 ENCOUNTER — Encounter: Payer: Self-pay | Admitting: Emergency Medicine

## 2019-04-16 DIAGNOSIS — J301 Allergic rhinitis due to pollen: Secondary | ICD-10-CM

## 2019-04-16 DIAGNOSIS — J449 Chronic obstructive pulmonary disease, unspecified: Secondary | ICD-10-CM | POA: Diagnosis not present

## 2019-04-16 DIAGNOSIS — J9611 Chronic respiratory failure with hypoxia: Secondary | ICD-10-CM | POA: Diagnosis not present

## 2019-04-16 DIAGNOSIS — J383 Other diseases of vocal cords: Secondary | ICD-10-CM | POA: Diagnosis not present

## 2019-04-16 DIAGNOSIS — K219 Gastro-esophageal reflux disease without esophagitis: Secondary | ICD-10-CM

## 2019-04-16 MED ORDER — ESOMEPRAZOLE MAGNESIUM 40 MG PO CPDR: 40.0000 mg | DELAYED_RELEASE_CAPSULE | Freq: Two times a day (BID) | ORAL | 2 refills | Status: DC

## 2019-04-16 NOTE — Assessment & Plan Note (Signed)
Continue oxygen to 3 L/min.

## 2019-04-16 NOTE — Assessment & Plan Note (Signed)
Moderate control on aggressive regimen.  This is her worst time of year, more mucus, more upper airway irritation, more cough.  Remains on Singulair, Flonase, Allegra, nasal saline rinses.

## 2019-04-16 NOTE — Progress Notes (Signed)
  Subjective:   Patient ID: [name] [name], female    DOB: [birth year], 74 y.o.   MRN: [id number]  HPI [name] [name] is a 74 year old with moderate COPD. Upper airway irritation syndrome and chronic cough. Allergic rhinitis.      ROV 04/16/19 --74 year old obese woman with moderate COPD and upper airway instability with chronic cough in the setting of GERD, allergic rhinitis.  She has chronic hypoxemic respiratory failure on 2 to 3 L/min, uses a POC. She had a COVID exposure back in 12/2018, but she tested negative.  She has been noticing some exertional SOB, increasing cough - usually non-productive. She is on [employer], singulair, flonase, Careers adviser. She is on omeprazole but has breakthrough GERD. She remains on Stiolto.   MDM: Reviewed her office notes from [facility] 12/2018    Objective:   Vitals:   04/16/19 1059  BP: 118/72  Pulse: (!) 122  SpO2: 92%  Weight: 149 lb (67.6 kg)    Gen: Pleasant, obese, in no distress,  normal affect  ENT: No lesions,  mouth clear,  oropharynx clear, no postnasal drip, strong voice today  Neck: No JVD, intermittent low pitched UA noise  Lungs: No use of accessory muscles, distant, no wheeze today  Cardiovascular: RRR, heart sounds normal, no murmur or gallops, no peripheral edema  Musculoskeletal: No deformities, no cyanosis or clubbing  Neuro: alert, non focal  Skin: Warm, no lesions or rashes   Assessment & Plan:  GERD (gastroesophageal reflux disease) Temporarily stop your omeprazole (Prilosec) Start Nexium 40 mg twice a day for the next 2 months.  Take this medication 1 hour around food.  After 2 months you can switch back to your usual omeprazole   COPD (chronic obstructive pulmonary disease) (HCC) Continue Stiolto 2 puffs once daily. Keep your albuterol available to use 2 puffs or 1 nebulizer treatment up to every 4 hours if needed for shortness of breath, chest tightness, wheezing. COVID-19 vaccine is up-to-date Follow with Dr Delton Coombes in 3  months or sooner if you have any problems.   Chronic respiratory failure with hypoxia (HCC) Continue oxygen to 3 L/min.  Allergic rhinitis Moderate control on aggressive regimen.  This is her worst time of year, more mucus, more upper airway irritation, more cough.  Remains on Singulair, Flonase, Allegra, nasal saline rinses.  Vocal cord dysfunction Attempting to control allergic rhinitis, GERD.  We will increase her GERD therapy for the next 2 months since this is when her cough is the most active    [name] Pupa, MD, PhD 04/16/2019, 11:23 AM [facility] [phone] or if no answer [phone]

## 2019-04-16 NOTE — Assessment & Plan Note (Signed)
Continue Stiolto 2 puffs once daily. Keep your albuterol available to use 2 puffs or 1 nebulizer treatment up to every 4 hours if needed for shortness of breath, chest tightness, wheezing. COVID-19 vaccine is up-to-date Follow with Dr Delton Coombes in 3 months or sooner if you have any problems.

## 2019-04-16 NOTE — Assessment & Plan Note (Signed)
Temporarily stop your omeprazole (Prilosec) Start Nexium 40 mg twice a day for the next 2 months.  Take this medication 1 hour around food.  After 2 months you can switch back to your usual omeprazole

## 2019-04-16 NOTE — Assessment & Plan Note (Signed)
Attempting to control allergic rhinitis, GERD.  We will increase her GERD therapy for the next 2 months since this is when her cough is the most active

## 2019-04-16 NOTE — Patient Instructions (Signed)
Temporarily stop your omeprazole (Prilosec) Start Nexium 40 mg twice a day for the next 2 months.  Take this medication 1 hour around food.  After 2 months you can switch back to your usual omeprazole Please continue your Singulair, Flonase, Allegra, nasal washes as you have been doing them. Continue Stiolto 2 puffs once daily. Keep your albuterol available to use 2 puffs or 1 nebulizer treatment up to every 4 hours if needed for shortness of breath, chest tightness, wheezing. Please continue your oxygen at 2 to 3 L/min COVID-19 vaccine is up-to-date Follow with Dr Delton Coombes in 3 months or sooner if you have any problems.

## 2019-04-22 DIAGNOSIS — J449 Chronic obstructive pulmonary disease, unspecified: Secondary | ICD-10-CM | POA: Diagnosis not present

## 2019-05-06 ENCOUNTER — Encounter: Payer: Self-pay | Admitting: Pulmonary Disease

## 2019-05-06 ENCOUNTER — Other Ambulatory Visit: Payer: Self-pay

## 2019-05-06 ENCOUNTER — Ambulatory Visit (INDEPENDENT_AMBULATORY_CARE_PROVIDER_SITE_OTHER): Payer: HMO | Admitting: Pulmonary Disease

## 2019-05-06 DIAGNOSIS — Z9981 Dependence on supplemental oxygen: Secondary | ICD-10-CM

## 2019-05-06 DIAGNOSIS — J309 Allergic rhinitis, unspecified: Secondary | ICD-10-CM | POA: Diagnosis not present

## 2019-05-06 DIAGNOSIS — K219 Gastro-esophageal reflux disease without esophagitis: Secondary | ICD-10-CM

## 2019-05-06 DIAGNOSIS — J449 Chronic obstructive pulmonary disease, unspecified: Secondary | ICD-10-CM | POA: Diagnosis not present

## 2019-05-06 DIAGNOSIS — J301 Allergic rhinitis due to pollen: Secondary | ICD-10-CM

## 2019-05-06 DIAGNOSIS — J383 Other diseases of vocal cords: Secondary | ICD-10-CM | POA: Diagnosis not present

## 2019-05-06 DIAGNOSIS — J9611 Chronic respiratory failure with hypoxia: Secondary | ICD-10-CM | POA: Diagnosis not present

## 2019-05-06 DIAGNOSIS — Z87891 Personal history of nicotine dependence: Secondary | ICD-10-CM | POA: Diagnosis not present

## 2019-05-06 MED ORDER — PREDNISONE 10 MG PO TABS: ORAL_TABLET | ORAL | 0 refills | Status: DC

## 2019-05-06 NOTE — Assessment & Plan Note (Signed)
Likely AR flare Nonadherent to nasal saline rinses Patient prone to significant allergies and cough during this time of year  Plan: Short course of prednisone Resume nasal saline rinses twice daily Continue Singulair Continue Allegra daily Continue Flonase after nasal saline rinses If no better in 1 week please contact our office for physical evaluation Keep follow-up with Dr. Delton Coombes

## 2019-05-06 NOTE — Patient Instructions (Addendum)
You were seen today by Lauraine Rinne, NP  for:   1. Allergic rhinitis due to pollen, unspecified seasonality  - predniSONE (DELTASONE) 10 MG tablet; Take 2 tablets (20mg  total) daily for the next 5 days. Take in the AM with food.  Dispense: 10 tablet; Refill: 0  Continue Allegra daily  Continue singular daily  Start nasal saline rinses twice daily Use distilled water Shake well Get bottle lukewarm like a baby bottle  Continue Flonase 1 spray each nostril after nasal saline rinse  2. Chronic respiratory failure with hypoxia (HCC)  Continue oxygen therapy as prescribed  >>>maintain oxygen saturations greater than 88 percent  >>>if unable to maintain oxygen saturations please contact the office  >>>do not smoke with oxygen  >>>can use nasal saline gel or nasal saline rinses to moisturize nose if oxygen causes dryness   3. Chronic obstructive pulmonary disease, unspecified COPD type ([place])  - predniSONE (DELTASONE) 10 MG tablet; Take 2 tablets (20mg  total) daily for the next 5 days. Take in the AM with food.  Dispense: 10 tablet; Refill: 0  Stiolto Respimat inhaler >>>2 puffs daily >>>Take this no matter what >>>This is not a rescue inhaler  Note your daily symptoms > remember "red flags" for COPD:   >>>Increase in cough >>>increase in sputum production >>>increase in shortness of breath or activity  intolerance.   If you notice these symptoms, please call the office to be seen.    4. Gastroesophageal reflux disease without esophagitis  Nexium  >>>Please take 1 tablet daily  30 -60 minutes before your first and last meal of the day as well as before your other medications >>>Try to take at the same time each day >>>take this medication daily  GERD management: >>>Avoid laying flat until 2 hours after meals >>>Elevate head of the bed including entire chest >>>Reduce size of meals and amount of fat, acid, spices, caffeine and sweets >>>If you are smoking, Please  stop! >>>Decrease alcohol consumption >>>Work on maintaining a healthy weight with normal BMI     5. Vocal cord dysfunction    We recommend today:   Meds ordered this encounter  Medications  . predniSONE (DELTASONE) 10 MG tablet    Sig: Take 2 tablets (20mg  total) daily for the next 5 days. Take in the AM with food.    Dispense:  10 tablet    Refill:  0    Follow Up:    Return in about 2 months (around 07/06/2019), or if symptoms worsen or fail to improve, for Follow up with Dr. Lamonte Sakai.   Please do your part to reduce the spread of COVID-19:      Reduce your risk of any infection  and COVID19 by using the similar precautions used for avoiding the common cold or flu:  Marland Kitchen Wash your hands often with soap and warm water for at least 20 seconds.  If soap and water are not readily available, use an alcohol-based hand sanitizer with at least 60% alcohol.  . If coughing or sneezing, cover your mouth and nose by coughing or sneezing into the elbow areas of your shirt or coat, into a tissue or into your sleeve (not your hands). Langley Gauss A MASK when in public  . Avoid shaking hands with others and consider head nods or verbal greetings only. . Avoid touching your eyes, nose, or mouth with unwashed hands.  . Avoid close contact with people who are sick. . Avoid places or events with large numbers of  people in one location, like concerts or sporting events. . If you have some symptoms but not all symptoms, continue to monitor at home and seek medical attention if your symptoms worsen. . If you are having a medical emergency, call 911.   ADDITIONAL HEALTHCARE OPTIONS FOR PATIENTS  [place] [facility] / e-Visit: [email or web address]         [facility]: [phone]  [facility]: [phone]                   [facility]: [phone]     It is flu season:   >>> Best ways to protect herself from  the flu: Receive the yearly flu vaccine, practice good hand hygiene washing with soap and also using hand sanitizer when available, eat a nutritious meals, get adequate rest, hydrate appropriately   Please contact the office if your symptoms worsen or you have concerns that you are not improving.   Thank you for choosing [facility] for your healthcare, and for allowing [place] to partner with you on your healthcare journey. I am thankful to be able to provide care to you today.   Elisha Headland FNP-C

## 2019-05-06 NOTE — Assessment & Plan Note (Signed)
Plan: Continue oxygen therapy at 3 L

## 2019-05-06 NOTE — Progress Notes (Signed)
Virtual Visit via Telephone Note  I connected with [name] [name] on 05/06/19 at  9:00 AM EDT by telephone and verified that I am speaking with the correct person using two identifiers.  Location: Patient: Home Provider: [facility] - [address], [place], [place], [place] [address]   I discussed the limitations, risks, security and privacy concerns of performing an evaluation and management service by telephone and the availability of in person appointments. I also discussed with the patient that there may be a patient responsible charge related to this service. The patient expressed understanding and agreed to proceed.  Patient consented to consult via telephone: Yes People present and their role in pt care: Pt     History of Present Illness:  74 year old female former smoker followed in our office for moderate COPD, chronic cough, irritable upper airway syndrome, chronic respiratory failure  PMH: GERD, allergic rhinitis, obesity Smoker/ Smoking History: Former smoker. Quit 1996. 60 pack year smoker.  Maintenance: Stiolto Respimat Pt of: Dr. Lamonte Sakai  Chief complaint: 1 month follow up   74 year old female former smoker followed in our office since for COPD.  Patient reporting that her cough had been well managed up until this weekend.  She started to have increased shortness of breath.  She has an occasional cough with productive milky mucus.  She also has been using her rescue Hailer twice daily.  This provides moderate relief.  Her acid reflux has been better managed on Nexium.  She reports adherence to Singulair, Allegra, Flonase.  Patient has not been doing nasal saline rinses.  Patient started taking Allegra twice daily to help with cough.  She feels that this has helped.  Patient remains adherent to Stiolto Respimat.  She prefers to remain on this inhaler as she receives this for free.  Observations/Objective:  09/04/2018-CBC with differential-eosinophils  absolute 0.4, eosinophils relative 6  01/01/2018-chest x-ray-small lingular opacity noted which may reflect sequela of prior pneumonia  07/27/2017-echocardiogram-LV ejection fraction 60 to 84%, grade 1 diastolic dysfunction, systolic pressure within normal range   Social History   Tobacco Use  Smoking Status Former Smoker  . Packs/day: 2.00  . Years: 30.00  . Pack years: 60.00  . Quit date: 01/09/1994  . Years since quitting: 25.3  Smokeless Tobacco Never Used   Immunization History  Administered Date(s) Administered  . Influenza Whole 12/09/2008, 10/09/2009, 10/02/2010, 09/10/2011  . Influenza, High Dose Seasonal PF 10/07/2016, 10/15/2017, 08/22/2018  . Influenza,inj,Quad PF,6+ Mos 10/01/2012, 10/08/2013, 09/30/2014, 10/11/2015  . Pneumococcal Conjugate-13 10/09/2013  . Pneumococcal-Unspecified 10/02/2010  . Zoster Recombinat (Shingrix) 02/27/2018, 07/01/2018      Assessment and Plan:  Allergic rhinitis Likely AR flare Nonadherent to nasal saline rinses Patient prone to significant allergies and cough during this time of year  Plan: Short course of prednisone Resume nasal saline rinses twice daily Continue Singulair Continue Allegra daily Continue Flonase after nasal saline rinses If no better in 1 week please contact our office for physical evaluation Keep follow-up with Dr. Lamonte Sakai  Chronic respiratory failure with hypoxia ([place]) Plan: Continue oxygen therapy at 3 L  COPD (chronic obstructive pulmonary disease) ([place]) mMRC 3 today Maintained on Stiolto Audible wheezing on telephone call today Using rescue inhaler twice daily with moderate relief Likely AR flare  Plan: Short course of prednisone Continue Stiolto Resume allergic rhinitis treatment Keep follow-up with our office  GERD (gastroesophageal reflux disease) Acid reflux well-controlled  Plan: Continue Nexium as outlined by Dr. Lamonte Sakai Keep follow-up with our  office Can transition back to  omeprazole after that office visit  Vocal cord dysfunction Likely AR flare Nonadherent to nasal saline rinses Adequate control with acid reflux   Follow Up Instructions:  Return in about 2 months (around 07/06/2019), or if symptoms worsen or fail to improve, for Follow up with Dr. Delton Coombes.   I discussed the assessment and treatment plan with the patient. The patient was provided an opportunity to ask questions and all were answered. The patient agreed with the plan and demonstrated an understanding of the instructions.   The patient was advised to call back or seek an in-person evaluation if the symptoms worsen or if the condition fails to improve as anticipated.  I provided 23 minutes of non-face-to-face time during this encounter.   [name], NP

## 2019-05-06 NOTE — Assessment & Plan Note (Signed)
Likely AR flare Nonadherent to nasal saline rinses Adequate control with acid reflux

## 2019-05-06 NOTE — Assessment & Plan Note (Signed)
mMRC 3 today Maintained on Stiolto Audible wheezing on telephone call today Using rescue inhaler twice daily with moderate relief Likely AR flare  Plan: Short course of prednisone Continue Stiolto Resume allergic rhinitis treatment Keep follow-up with our office

## 2019-05-06 NOTE — Assessment & Plan Note (Signed)
Acid reflux well-controlled  Plan: Continue Nexium as outlined by Dr. Delton Coombes Keep follow-up with our office Can transition back to omeprazole after that office visit

## 2019-05-22 DIAGNOSIS — J449 Chronic obstructive pulmonary disease, unspecified: Secondary | ICD-10-CM | POA: Diagnosis not present

## 2019-06-11 DIAGNOSIS — R5382 Chronic fatigue, unspecified: Secondary | ICD-10-CM | POA: Diagnosis not present

## 2019-06-11 DIAGNOSIS — Z7901 Long term (current) use of anticoagulants: Secondary | ICD-10-CM | POA: Diagnosis not present

## 2019-06-11 DIAGNOSIS — E7800 Pure hypercholesterolemia, unspecified: Secondary | ICD-10-CM | POA: Diagnosis not present

## 2019-06-20 ENCOUNTER — Telehealth: Payer: Self-pay | Admitting: Emergency Medicine

## 2019-06-20 MED ORDER — PREDNISONE 10 MG PO TABS
ORAL_TABLET | ORAL | 0 refills | Status: DC
Start: 2019-06-20 — End: 2019-07-02

## 2019-06-20 MED ORDER — DOXYCYCLINE HYCLATE 100 MG PO TABS
100.0000 mg | ORAL_TABLET | Freq: Two times a day (BID) | ORAL | 0 refills | Status: DC
Start: 2019-06-20 — End: 2019-11-18

## 2019-06-20 NOTE — Telephone Encounter (Signed)
Spoke with pt, she states she is having trouble breathing for about a week. She is doing her breathing treatments twice a day and inhaler. She is using her oxygen and has kept it at 3L but she feels like she should turn it up some. She doesn't have a pulse oximeter to check her oxygen and she is unable to walk to the kitchen without getting SOB. She is coughing up yellow mucus and used Mucinex to help cough the mucus up. She is also using the albuterol for relief and it has helped some. She thinks she is having a COPD exacerbation. RB please advise. We have no availability for appointments today.   [facility]

## 2019-06-20 NOTE — Telephone Encounter (Signed)
Treat w doxy 100mg  bid x 5 days Pred >> Take 40mg  daily for 3 days, then 30mg  daily for 3 days, then 20mg  daily for 3 days, then 10mg  daily for 3 days, then stop

## 2019-06-20 NOTE — Telephone Encounter (Signed)
Spoke with pt. She is aware of Dr. Kavin Leech response. Rxs have been sent in. Nothing further was needed.

## 2019-06-22 DIAGNOSIS — J449 Chronic obstructive pulmonary disease, unspecified: Secondary | ICD-10-CM | POA: Diagnosis not present

## 2019-06-25 ENCOUNTER — Ambulatory Visit: Payer: Self-pay | Admitting: Pharmacist

## 2019-07-01 NOTE — Progress Notes (Signed)
Patient Care Team: Shirlean Mylar, MD as PCP - General (Family Medicine) Delton Coombes Les Pou, MD (Pulmonary Disease) Yetta Glassman, RN as [facility] Care Management  DIAGNOSIS:    ICD-10-CM   1. Iron deficiency anemia, unspecified iron deficiency anemia type  D50.9     CHIEF COMPLIANT: Follow-up of iron deficiency anemia  INTERVAL HISTORY: [name] [name] is a 74 y.o. with above-mentioned history of iron deficiency anemiapreviously treated with IV iron, B12 deficiency, and pulmonary embolism on Xarelto. She presents to the clinic today for follow-up.  She reports no new symptoms or concerns.  She has had excellent health.  Denies any fatigue or shortness of breath exertion.  ALLERGIES:  is allergic to lisinopril and sulfonamide derivatives.  MEDICATIONS:  Current Outpatient Medications  Medication Sig Dispense Refill  . albuterol (PROAIR HFA) 108 (90 Base) MCG/ACT inhaler INHALE 2 PUFFS INTO THE LUNGS EVERY 6 (SIX) HOURS AS NEEDED FOR WHEEZING OR SHORTNESS OF BREATH. (Patient taking differently: Inhale 2 puffs into the lungs every 6 (six) hours as needed for wheezing or shortness of breath. ) 3 Inhaler 1  . albuterol (PROVENTIL) (2.5 MG/3ML) 0.083% nebulizer solution Take 3 mLs (2.5 mg total) by nebulization every 4 (four) hours as needed for wheezing or shortness of breath (DX: COPD 496). 150 mL 12  . Ascorbic Acid (VITAMIN C) 1000 MG tablet Take 1,000 mg by mouth daily.      Marland Kitchen aspirin 81 MG chewable tablet Chew 81 mg by mouth at bedtime.     Marland Kitchen atenolol (TENORMIN) 50 MG tablet Take 50 mg by mouth daily.      Marland Kitchen atorvastatin (LIPITOR) 20 MG tablet Take 20 mg by mouth daily.      . Calcium Carbonate-Vitamin D (CALTRATE 600+D) 600-400 MG-UNIT per tablet Take 2 tablets by mouth daily.      Marland Kitchen doxycycline (VIBRA-TABS) 100 MG tablet Take 1 tablet (100 mg total) by mouth 2 (two) times daily. 10 tablet 0  . esomeprazole (NEXIUM) 40 MG capsule Take 1 capsule (40 mg total) by  mouth 2 (two) times daily before a meal. 60 capsule 2  . ferrous sulfate 325 (65 FE) MG tablet Take 325 mg by mouth daily.    . fexofenadine (ALLEGRA) 180 MG tablet Take 180 mg by mouth daily.    . fluticasone (FLONASE) 50 MCG/ACT nasal spray SPRAY 2 SPRAYS INTO EACH NOSTRIL EVERY DAY 16 mL 5  . levothyroxine (SYNTHROID) 88 MCG tablet Take 88 mcg by mouth daily.    Marland Kitchen losartan (COZAAR) 25 MG tablet Take 25 mg by mouth daily.    . metFORMIN (GLUCOPHAGE-XR) 500 MG 24 hr tablet Take 500 mg by mouth daily.    . montelukast (SINGULAIR) 10 MG tablet Take 1 tablet (10 mg total) by mouth at bedtime. 30 tablet 11  . Multiple Vitamins-Minerals (CENTRUM PO) Take 1 tablet by mouth daily.      . nitroGLYCERIN (NITROSTAT) 0.4 MG SL tablet Place 1 tablet (0.4 mg total) under the tongue every 5 (five) minutes as needed for chest pain. 25 tablet 4  . omeprazole (PRILOSEC) 20 MG capsule TAKE 1 CAPSULE (20 MG TOTAL) BY MOUTH 2 (TWO) TIMES DAILY BEFORE A MEAL. 30 capsule 5  . ONE TOUCH ULTRA TEST test strip As directed    . predniSONE (DELTASONE) 10 MG tablet Take 2 tablets (20mg  total) daily for the next 5 days. Take in the AM with food. 10 tablet 0  . predniSONE (DELTASONE) 10 MG tablet  Take 4 tablets for 3 days, 3 tablets for 3 days, 2 tablets for 3 days, 1 tablet for 3 days 30 tablet 0  . Tiotropium Bromide-Olodaterol (STIOLTO RESPIMAT) 2.5-2.5 MCG/ACT AERS Inhale 2 puffs into the lungs daily. 2 Inhaler 0  . traZODone (DESYREL) 50 MG tablet Take 50 mg by mouth at bedtime.     Marland Kitchen venlafaxine XR (EFFEXOR-XR) 150 MG 24 hr capsule Take 150 mg by mouth daily.     Marland Kitchen warfarin (COUMADIN) 5 MG tablet Take 5 mg by mouth daily.     No current facility-administered medications for this visit.    PHYSICAL EXAMINATION: ECOG PERFORMANCE STATUS: 1 - Symptomatic but completely ambulatory  Vitals:   07/02/19 1420  BP: 123/71  Pulse: 93  Resp: 16  Temp: 98.5 F (36.9 C)  SpO2: 91%   Filed Weights   07/02/19 1420    Weight: 149 lb 1.6 oz (67.6 kg)    LABORATORY DATA:  I have reviewed the data as listed CMP Latest Ref Rng & Units 01/01/2018 07/30/2017 07/26/2017  Glucose 70 - 99 mg/dL 112(H) 146(H) 116(H)  BUN 8 - 23 mg/dL 22 8 11   Creatinine 0.44 - 1.00 mg/dL 1.27(H) 0.94 1.02(H)  Sodium 135 - 145 mmol/L 138 140 140  Potassium 3.5 - 5.1 mmol/L 4.1 4.0 4.5  Chloride 98 - 111 mmol/L 106 101 102  CO2 22 - 32 mmol/L 23 30 28   Calcium 8.9 - 10.3 mg/dL 8.7(L) 9.2 8.9  Total Protein 6.5 - 8.1 g/dL 5.9(L) - -  Total Bilirubin 0.3 - 1.2 mg/dL 0.6 - -  Alkaline Phos 38 - 126 U/L 49 - -  AST 15 - 41 U/L 17 - -  ALT 0 - 44 U/L 11 - -    Lab Results  Component Value Date   WBC 6.9 12/09/2018   HGB 12.0 12/09/2018   HCT 38.6 12/09/2018   MCV 92.3 12/09/2018   PLT 273 12/09/2018   NEUTROABS 4.7 12/09/2018    ASSESSMENT & PLAN:  Iron deficiency anemia With negative fecal stool Hemoccultsdetected when she went to the emergency room January 01, 2018 with hypertension  Prior treatment: IV iron January 2020 Current treatment: Oral iron supplementation.  Lab review: 06/05/2018: Hemoglobin 11.3, ferritin 44, iron saturation 9% 09/04/2018:Hemoglobin 11.6, MCV 93.7, iron saturation 15%,Ferritin 55 12/09/2018: Hemoglobin 12, MCV 92.3, iron saturation 27%, ferritin 47 07/02/2019: Hemoglobin 12.7 iron studies are pending  Currently on warfarin for PE. GI evaluation: Dr. Oletta Lamas    Based on excellent hemoglobin parameters, I recommended that she could be followed with [place] on an as-needed basis. I will request her primary care physician to refer her back to [place] if she develops further problems with anemia.   No orders of the defined types were placed in this encounter.  The patient has a good understanding of the overall plan. she agrees with it. she will call with any problems that may develop before the next visit here.  Total time spent: 20 mins including face to face time and time spent for  planning, charting and coordination of care  Nicholas Lose, MD 07/02/2019  I, Cloyde Reams Dorshimer, am acting as scribe for Dr. Nicholas Lose.  I have reviewed the above documentation for accuracy and completeness, and I agree with the above.

## 2019-07-02 ENCOUNTER — Other Ambulatory Visit: Payer: Self-pay

## 2019-07-02 ENCOUNTER — Inpatient Hospital Stay: Payer: HMO

## 2019-07-02 ENCOUNTER — Inpatient Hospital Stay: Payer: HMO | Attending: Hematology and Oncology | Admitting: Hematology and Oncology

## 2019-07-02 DIAGNOSIS — Z888 Allergy status to other drugs, medicaments and biological substances status: Secondary | ICD-10-CM | POA: Insufficient documentation

## 2019-07-02 DIAGNOSIS — Z79899 Other long term (current) drug therapy: Secondary | ICD-10-CM | POA: Diagnosis not present

## 2019-07-02 DIAGNOSIS — Z882 Allergy status to sulfonamides status: Secondary | ICD-10-CM | POA: Insufficient documentation

## 2019-07-02 DIAGNOSIS — I2699 Other pulmonary embolism without acute cor pulmonale: Secondary | ICD-10-CM | POA: Insufficient documentation

## 2019-07-02 DIAGNOSIS — E538 Deficiency of other specified B group vitamins: Secondary | ICD-10-CM | POA: Diagnosis not present

## 2019-07-02 DIAGNOSIS — D509 Iron deficiency anemia, unspecified: Secondary | ICD-10-CM

## 2019-07-02 DIAGNOSIS — I10 Essential (primary) hypertension: Secondary | ICD-10-CM | POA: Diagnosis not present

## 2019-07-02 DIAGNOSIS — Z7901 Long term (current) use of anticoagulants: Secondary | ICD-10-CM | POA: Diagnosis not present

## 2019-07-02 LAB — CBC WITH DIFFERENTIAL (CANCER CENTER ONLY)
Abs Immature Granulocytes: 0.05 10*3/uL (ref 0.00–0.07)
Basophils Absolute: 0.1 10*3/uL (ref 0.0–0.1)
Basophils Relative: 1 %
Eosinophils Absolute: 0.4 10*3/uL (ref 0.0–0.5)
Eosinophils Relative: 4 %
HCT: 39.4 % (ref 36.0–46.0)
Hemoglobin: 12.7 g/dL (ref 12.0–15.0)
Immature Granulocytes: 1 %
Lymphocytes Relative: 21 %
Lymphs Abs: 2.3 10*3/uL (ref 0.7–4.0)
MCH: 30.4 pg (ref 26.0–34.0)
MCHC: 32.2 g/dL (ref 30.0–36.0)
MCV: 94.3 fL (ref 80.0–100.0)
Monocytes Absolute: 0.8 10*3/uL (ref 0.1–1.0)
Monocytes Relative: 8 %
Neutro Abs: 7.3 10*3/uL (ref 1.7–7.7)
Neutrophils Relative %: 65 %
Platelet Count: 276 10*3/uL (ref 150–400)
RBC: 4.18 MIL/uL (ref 3.87–5.11)
RDW: 13.4 % (ref 11.5–15.5)
WBC Count: 10.9 10*3/uL — ABNORMAL HIGH (ref 4.0–10.5)
nRBC: 0 % (ref 0.0–0.2)

## 2019-07-02 NOTE — Assessment & Plan Note (Signed)
With negative fecal stool Hemoccultsdetected when she went to the emergency room January 01, 2018 with hypertension  Prior treatment: IV iron January 2020 Current treatment: Oral iron supplementation.  Lab review: 06/05/2018: Hemoglobin 11.3, ferritin 44, iron saturation 9% 09/04/2018:Hemoglobin 11.6, MCV 93.7, iron saturation 15%,Ferritin 55 12/09/2018: Hemoglobin 12, MCV 92.3, iron saturation 27%, ferritin 47  Currently on warfarin for PE. GI evaluation: Dr. Randa Evens    Continue with current treatment and follow-up in 6 months with labs done ahead of time

## 2019-07-03 ENCOUNTER — Telehealth: Payer: Self-pay | Admitting: Hematology and Oncology

## 2019-07-03 LAB — FERRITIN: Ferritin: 54 ng/mL (ref 11–307)

## 2019-07-03 LAB — IRON AND TIBC
Iron: 116 ug/dL (ref 41–142)
Saturation Ratios: 37 % (ref 21–57)
TIBC: 310 ug/dL (ref 236–444)
UIBC: 194 ug/dL (ref 120–384)

## 2019-07-03 NOTE — Telephone Encounter (Signed)
No 6/23 los, no changes made to patient schedule   

## 2019-07-16 DIAGNOSIS — H5202 Hypermetropia, left eye: Secondary | ICD-10-CM | POA: Diagnosis not present

## 2019-07-16 DIAGNOSIS — H2512 Age-related nuclear cataract, left eye: Secondary | ICD-10-CM | POA: Diagnosis not present

## 2019-07-16 DIAGNOSIS — H2511 Age-related nuclear cataract, right eye: Secondary | ICD-10-CM | POA: Diagnosis not present

## 2019-07-16 DIAGNOSIS — E113293 Type 2 diabetes mellitus with mild nonproliferative diabetic retinopathy without macular edema, bilateral: Secondary | ICD-10-CM | POA: Diagnosis not present

## 2019-07-16 DIAGNOSIS — H524 Presbyopia: Secondary | ICD-10-CM | POA: Diagnosis not present

## 2019-07-16 DIAGNOSIS — H5211 Myopia, right eye: Secondary | ICD-10-CM | POA: Diagnosis not present

## 2019-07-22 DIAGNOSIS — J449 Chronic obstructive pulmonary disease, unspecified: Secondary | ICD-10-CM | POA: Diagnosis not present

## 2019-07-23 ENCOUNTER — Other Ambulatory Visit: Payer: Self-pay

## 2019-07-23 NOTE — Patient Outreach (Signed)
  [facility]) Care Management Chronic Special Needs Program  07/23/2019  Name: Amoreena Neubert DOB: 1945/04/04  MRN: [id number]  Ms. Janaisa Birkland is enrolled in a chronic special needs plan for Diabetes. Client called with no answer No answer and unable to leave HIPAA compliant message. 1st attempt Plan for 2nd outreach call in one week Chronic care management coordinator will attempt outreach in one week.   Dudley Major RN, Maximiano Coss, CDE Chronic Care Management Coordinator [facility] [phone]

## 2019-07-24 DIAGNOSIS — N390 Urinary tract infection, site not specified: Secondary | ICD-10-CM | POA: Diagnosis not present

## 2019-07-24 DIAGNOSIS — R319 Hematuria, unspecified: Secondary | ICD-10-CM | POA: Diagnosis not present

## 2019-07-24 DIAGNOSIS — H6122 Impacted cerumen, left ear: Secondary | ICD-10-CM | POA: Diagnosis not present

## 2019-07-25 ENCOUNTER — Other Ambulatory Visit: Payer: Self-pay

## 2019-07-25 NOTE — Patient Outreach (Signed)
[facility]) Care Management Chronic Special Needs Program  07/25/2019  Name: [name] [name] DOB: 01-17-1945  MRN: [id number]  [name] [name] is enrolled in a chronic special needs plan for Diabetes. Reviewed and updated care plan.  Subjective: Client states that she has been doing good.  States that the hot weather effects her breathing so she needs to wear her O2 more often. States she did get a pulse oximeter with her over the counter benefit.   States she takes her O2 with her when she goes out to use if needed.  States she checks her blood sugars daily and they are ranging from 90-154 in the morning.  Denies any low readings.  States she is trying to follow a healthy diet and watches her portion sizes.  Denies any chest pain or heart problems.  States her B/P is always lower when checked.  Denies any issues with her mood or depression. Reports she has had both of her COVID shots.  Goals Addressed              This Visit's Progress   .  COMPLETED: Client understands the importance of follow-up with providers by attending scheduled visits   On track     Keeping scheduled appointments Plan to keep scheduled appointments with providers Goal completed 07/25/19    .  Client will report no worsening of symptoms related to heart disease within the next 6 months (continue 07/25/19)   On track     Reports no issues with heart disease Reviewed signs and symptoms to call doctor and when to call 911 Follow a low salt diet  Notify provider for symptoms of chest pain, sweating, nausea/vomiting, irregular heartbeat, palpitations, rapid heart rate, shortness of breath or dizziness or fainting. Call 911 for severe symptoms of chest pain or shortness of breath. Take medications as prescribed    .  COMPLETED: Client will use Assistive Devices as needed and verbalize understanding of device use   On track     Using glucometer without issue Reports getting pulse oximeter  with over the counter benefit  Goal completed 07/25/19    .  Client will verbalize knowledge of chronic lung disease as evidenced by no ED visits or Inpatient stays related to chronic lung disease (continue 07/25/19)   On track     Reports no changes in COPD with no ED or hospital visits Reviewed COPD action plan Plan to pace activity and go out in cooler parts of the day Contact your provider for any signs and symptoms of mild shortness of breath Call 911 if you are having severe shortness of breath    .  Client will verbalize knowledge of self management of Hypertension as evidences by BP reading of 140/90 or less; or as defined by provider   On track     Reports B/P below 140/90 when checked Plan to check B/P regularly Take B/P medications as ordered Plan to follow a low salt diet  Increase activity as tolerated    .  Diabetes Patient stated goal to walk 10 minutes twice a week for the next 6 months (pt-stated)        Plan to walk in air conditioned buildings such as big box stores Plan to take your oxygen to use if needed Plan to pace your activity and increase as tolerated    .  HEMOGLOBIN A1C < 7        Last Hemoglobin A1C 7% on 02/26/19 Reviewed  fasting blood sugar goals of 80-130 and less than 180 1 1/2-2 hours after meals Reinforced to follow a low carbohydrate low salt diet and to watch portion sizes Reviewed use and possible side effects of diabetes medications  Reviewed signs and symptoms of hypoglycemia and actions to take Please review diabetes action plan in [employer] calendar    .  COMPLETED: Maintain timely refills of diabetic medication as prescribed within the year .   On track     Maintaining timely refills per record Goal completed 07/25/19    .  COMPLETED: Obtain annual  Lipid Profile, LDL-C   On track     Completed 02/26/19 LDL 67 The goal for LDL is less than 70 mg/dL as you are at high risk for complications Try to avoid saturated fats, trans-fats  and eat more fiber Plan to take statin as ordered  Goal completed 07/25/19    .  COMPLETED: Obtain Annual Eye (retinal)  Exam    On track     Completed 07/16/19 Plan to schedule annual eye exam  Goal completed 07/25/19    .  COMPLETED: Obtain Annual Foot Exam   On track     Completed 02/26/19 Check your skin and feet every day for cuts, bruises, redness, blisters, or sores. Report to provider any problems with your feet Schedule a foot exam with your health care provider once every year Goal completed 07/25/19    .  COMPLETED: Obtain annual screen for micro albuminuria (urine) , nephropathy (kidney problems)   On track     Completed 02/26/19 It is important for your doctor to check your urine for protein at least every year Goal completed 07/25/19    .  Obtain Hemoglobin A1C at least 2 times per year   On track     Completed 02/26/19 It is important to have your Hemoglobin A1C checked every 6 months if you are at goal and every 3 months if you are not at goal    .  Visit Primary Care Provider or Endocrinologist at least 2 times per year    On track     Primary care provider 02/26/19, 06/11/19 Annual Wellness visit 06/11/19        Plan:  Send successful outreach letter with a copy of their individualized care plan and Send individual care plan to provider  Client will be outreached by a [employer] [facility] RNCM in 6 months per tier level    Dudley Major RN, Maximiano Coss, CDE Chronic Care Management Coordinator [facility] [phone]

## 2019-07-29 ENCOUNTER — Other Ambulatory Visit: Payer: Self-pay | Admitting: Emergency Medicine

## 2019-08-04 ENCOUNTER — Ambulatory Visit: Payer: HMO

## 2019-08-22 DIAGNOSIS — J449 Chronic obstructive pulmonary disease, unspecified: Secondary | ICD-10-CM | POA: Diagnosis not present

## 2019-08-26 DIAGNOSIS — H18413 Arcus senilis, bilateral: Secondary | ICD-10-CM | POA: Diagnosis not present

## 2019-08-26 DIAGNOSIS — H25013 Cortical age-related cataract, bilateral: Secondary | ICD-10-CM | POA: Diagnosis not present

## 2019-08-26 DIAGNOSIS — H2513 Age-related nuclear cataract, bilateral: Secondary | ICD-10-CM | POA: Diagnosis not present

## 2019-08-26 DIAGNOSIS — H2511 Age-related nuclear cataract, right eye: Secondary | ICD-10-CM | POA: Diagnosis not present

## 2019-08-26 DIAGNOSIS — H25043 Posterior subcapsular polar age-related cataract, bilateral: Secondary | ICD-10-CM | POA: Diagnosis not present

## 2019-09-05 DIAGNOSIS — R0603 Acute respiratory distress: Secondary | ICD-10-CM | POA: Diagnosis not present

## 2019-09-05 DIAGNOSIS — R079 Chest pain, unspecified: Secondary | ICD-10-CM | POA: Diagnosis not present

## 2019-09-05 DIAGNOSIS — J449 Chronic obstructive pulmonary disease, unspecified: Secondary | ICD-10-CM | POA: Diagnosis not present

## 2019-09-12 DIAGNOSIS — J449 Chronic obstructive pulmonary disease, unspecified: Secondary | ICD-10-CM | POA: Diagnosis not present

## 2019-09-22 DIAGNOSIS — J449 Chronic obstructive pulmonary disease, unspecified: Secondary | ICD-10-CM | POA: Diagnosis not present

## 2019-10-06 DIAGNOSIS — H2511 Age-related nuclear cataract, right eye: Secondary | ICD-10-CM | POA: Diagnosis not present

## 2019-10-07 DIAGNOSIS — H2512 Age-related nuclear cataract, left eye: Secondary | ICD-10-CM | POA: Diagnosis not present

## 2019-10-09 ENCOUNTER — Other Ambulatory Visit: Payer: Self-pay | Admitting: Emergency Medicine

## 2019-10-22 DIAGNOSIS — J449 Chronic obstructive pulmonary disease, unspecified: Secondary | ICD-10-CM | POA: Diagnosis not present

## 2019-10-24 ENCOUNTER — Other Ambulatory Visit: Payer: Self-pay | Admitting: Family Medicine

## 2019-10-24 DIAGNOSIS — Z1231 Encounter for screening mammogram for malignant neoplasm of breast: Secondary | ICD-10-CM

## 2019-10-27 DIAGNOSIS — H2512 Age-related nuclear cataract, left eye: Secondary | ICD-10-CM | POA: Diagnosis not present

## 2019-10-28 ENCOUNTER — Telehealth: Payer: Self-pay | Admitting: Emergency Medicine

## 2019-10-28 MED ORDER — AZITHROMYCIN 250 MG PO TABS: ORAL_TABLET | ORAL | 0 refills | Status: DC

## 2019-10-28 MED ORDER — PREDNISONE 10 MG PO TABS: 30.0000 mg | ORAL_TABLET | Freq: Every day | ORAL | 0 refills | Status: AC

## 2019-10-28 NOTE — Telephone Encounter (Signed)
Called and spoke with patient who states that on Sunday she started having shortness of breath, productive cough with yellow sputum, she is not sure if she has had a fever. States that this normally happens with the season changes. She has been using nebulizer, flonase, Allegra, Singulair, Mucinex the last 2 days and Stiolto inhaler twice a day in morning and oxygen.   Dr. Delton Coombes please advise

## 2019-10-28 NOTE — Telephone Encounter (Signed)
Called and spoke with patient, advised of patient of Dr. Kavin Leech recommendations.  She verbalized understanding.  Verified pharmacy and scripts sent.  Nothing further needed.

## 2019-10-28 NOTE — Telephone Encounter (Signed)
Please have her take Pred 30mg  qd x 5 days Also azithromycin Z-pack. She needs to call if no improvement.

## 2019-11-17 ENCOUNTER — Ambulatory Visit: Payer: HMO

## 2019-11-17 ENCOUNTER — Ambulatory Visit: Payer: HMO | Admitting: Pulmonary Disease

## 2019-11-17 ENCOUNTER — Telehealth: Payer: Self-pay | Admitting: Emergency Medicine

## 2019-11-17 DIAGNOSIS — J449 Chronic obstructive pulmonary disease, unspecified: Secondary | ICD-10-CM

## 2019-11-17 NOTE — Telephone Encounter (Signed)
appt scheduled for 11/18/2019 at 3:30.  Patient is aware to arrive at 3:00 with CXR.  STAT CXR has been ordered. Nothing further needed.

## 2019-11-17 NOTE — Telephone Encounter (Signed)
11/17/2019  Yes would be more than happy to see the patient.  Please order a stat chest x-ray to be completed prior to her office visit.  Please make sure she knows to arrive 30 minutes before her scheduled times that she can complete the chest x-ray prior to being seen.  We will route to Dr. Delton Coombes as Lorain Childes.  Elisha Headland, FNP

## 2019-11-17 NOTE — Telephone Encounter (Signed)
She was just treated for an acute flare less than a month ago. I believe she will need to have an OV to be seen.

## 2019-11-17 NOTE — Telephone Encounter (Signed)
Arlys John, you have seen patient previously.  Would it be okay for her to come into clinic for a visit on 11/18/2019?

## 2019-11-17 NOTE — Telephone Encounter (Signed)
Called and spoke to patient. Patient reports of productive cough with yellow sputum, increased sob with exertion. Sx have been present for 3 days.  Denied fever, chills, sweats or additional sx. She is on 3L with exertion. She has not monitored oxygen levels.  She has had both covid vaccines and flu shot.  Using albuterol neb TID and Stiolto daily with no relief in sx.  Not using any OTC medications to help with sx.   Dr. Delton Coombes, please advise. Thanks

## 2019-11-18 ENCOUNTER — Other Ambulatory Visit: Payer: Self-pay

## 2019-11-18 ENCOUNTER — Ambulatory Visit: Payer: HMO | Admitting: Pulmonary Disease

## 2019-11-18 ENCOUNTER — Ambulatory Visit (INDEPENDENT_AMBULATORY_CARE_PROVIDER_SITE_OTHER): Payer: HMO

## 2019-11-18 ENCOUNTER — Encounter: Payer: Self-pay | Admitting: Pulmonary Disease

## 2019-11-18 VITALS — BP 120/80 | HR 74 | Temp 98.1°F | Ht 59.0 in | Wt 150.0 lb

## 2019-11-18 DIAGNOSIS — J9611 Chronic respiratory failure with hypoxia: Secondary | ICD-10-CM

## 2019-11-18 DIAGNOSIS — J0120 Acute ethmoidal sinusitis, unspecified: Secondary | ICD-10-CM | POA: Diagnosis not present

## 2019-11-18 DIAGNOSIS — J449 Chronic obstructive pulmonary disease, unspecified: Secondary | ICD-10-CM | POA: Diagnosis not present

## 2019-11-18 DIAGNOSIS — Z0000 Encounter for general adult medical examination without abnormal findings: Secondary | ICD-10-CM

## 2019-11-18 DIAGNOSIS — R0602 Shortness of breath: Secondary | ICD-10-CM | POA: Diagnosis not present

## 2019-11-18 MED ORDER — PREDNISONE 10 MG PO TABS: ORAL_TABLET | ORAL | 0 refills | Status: DC

## 2019-11-18 MED ORDER — BREZTRI AEROSPHERE 160-9-4.8 MCG/ACT IN AERO: 2.0000 | INHALATION_SPRAY | Freq: Two times a day (BID) | RESPIRATORY_TRACT | 0 refills | Status: DC

## 2019-11-18 MED ORDER — AMOXICILLIN-POT CLAVULANATE 875-125 MG PO TABS: 1.0000 | ORAL_TABLET | Freq: Two times a day (BID) | ORAL | 0 refills | Status: DC

## 2019-11-18 NOTE — Assessment & Plan Note (Signed)
Plan: Trial of Breztri given eosinophilia on lab work as well as recent COPD exacerbations Hold Stiolto Respimat Patient to contact our office and let us know how she is doing on the samples If she is tolerating well we will send in a prescription Follow-up in 2 months

## 2019-11-18 NOTE — Assessment & Plan Note (Signed)
Plan: Recommend COVID-19 booster, patient declines

## 2019-11-18 NOTE — Assessment & Plan Note (Signed)
Plan: Continue oxygen therapy 

## 2019-11-18 NOTE — Progress Notes (Signed)
@Patient  ID: , female    DOB: 1945/04/10, 74 y.o.   MRN: [id number]  Chief Complaint  Patient presents with  . Follow-up    COPD, more SOB since Saturday with cough with yellow mucous, wheezing    Referring provider: Saturday, MD  HPI:  74 year old female former smoker followed in our office for moderate COPD, chronic cough, irritable upper airway syndrome, chronic respiratory failure  PMH: GERD, allergic rhinitis, obesity Smoker/ Smoking History: Former smoker. Quit 1996. 60 pack year smoker.  Maintenance: Stiolto Respimat Pt of: Dr. 66  11/18/2019  - Visit   74 year old female former smoker followed in our office for COPD, chronic cough irritable upper airway syndrome and chronic respiratory failure.  She is managed by Dr. 61.  Last seen telephonically in our office in April/2021.  Patient contacted our office in November/2021 reporting increased productive cough, yellow sputum, with shortness of breath on exertion for 3 days.  She is vaccinated COVID-19 has received her seasonal flu vaccine.  She has been using her albuterol nebulized medication 3 times daily without relief.  She continues to be adherent to May/2021.  Patient is scheduled for follow-up in our office with a chest x-ray.  Chest x-ray results are listed below:  11/18/2019-chest x-ray-no active cardiopulmonary disease  Per chart review patient has been treated for COPD exacerbation in June/2021 as well as a COPD exacerbation October/2021.  Labs in chart does show eosinophilia on lab work.  Patient has never been tried on ICS inhaler before.  Patient is up-to-date with COVID-19 vaccinations.  She is not going to receive her booster.  Patient reporting that symptoms have been persisting especially postnasal drip and nasal drainage.  She is unsure the color of her nasal drainage.     Questionaires / Pulmonary Flowsheets:   ACT:  No flowsheet data found.  MMRC: No flowsheet data  found.  Epworth:  No flowsheet data found.  Tests:   09/04/2018-CBC with differential-eosinophils absolute 0.4, eosinophils relative 6  01/01/2018-chest x-ray-small lingular opacity noted which may reflect sequela of prior pneumonia  07/27/2017-echocardiogram-LV ejection fraction 60 to 65%, grade 1 diastolic dysfunction, systolic pressure within normal range   FENO:  No results found for: NITRICOXIDE  PFT: No flowsheet data found.  WALK:  SIX MIN WALK 07/31/2016 07/31/2016 07/31/2016  Supplimental Oxygen during Test? (L/min) Yes Yes No  O2 Flow Rate 3 2 -  Type Pulse Pulse -    Imaging: DG Chest 2 View  Result Date: 11/18/2019 CLINICAL DATA:  Shortness of breath. EXAM: CHEST - 2 VIEW COMPARISON:  September 05, 2019. FINDINGS: The heart size and mediastinal contours are within normal limits. Both lungs are clear. No pneumothorax or pleural effusion is noted. The visualized skeletal structures are unremarkable. IMPRESSION: No active cardiopulmonary disease. Electronically Signed   By: September 07, 2019 M.D.   On: 11/18/2019 15:35    Lab Results:  CBC    Component Value Date/Time   WBC 10.9 (H) 07/02/2019 1458   WBC 9.6 01/01/2018 1145   RBC 4.18 07/02/2019 1458   HGB 12.7 07/02/2019 1458   HGB 12.5 02/21/2017 1428   HCT 39.4 07/02/2019 1458   HCT 38.6 02/21/2017 1428   PLT 276 07/02/2019 1458   PLT 252 02/21/2017 1428   MCV 94.3 07/02/2019 1458   MCV 91 02/21/2017 1428   MCH 30.4 07/02/2019 1458   MCHC 32.2 07/02/2019 1458   RDW 13.4 07/02/2019 1458   RDW 13.7 02/21/2017 1428  LYMPHSABS 2.3 07/02/2019 1458   LYMPHSABS 1.6 02/21/2017 1428   MONOABS 0.8 07/02/2019 1458   EOSABS 0.4 07/02/2019 1458   EOSABS 0.4 02/21/2017 1428   BASOSABS 0.1 07/02/2019 1458   BASOSABS 0.0 02/21/2017 1428    BMET    Component Value Date/Time   NA 138 01/01/2018 1145   NA 146 (H) 02/21/2017 1428   K 4.1 01/01/2018 1145   CL 106 01/01/2018 1145   CO2 23 01/01/2018 1145   GLUCOSE  112 (H) 01/01/2018 1145   BUN 22 01/01/2018 1145   BUN 7 (L) 02/21/2017 1428   CREATININE 1.27 (H) 01/01/2018 1145   CALCIUM 8.7 (L) 01/01/2018 1145   GFRNONAA 42 (L) 01/01/2018 1145   GFRAA 48 (L) 01/01/2018 1145    BNP No results found for: BNP  ProBNP No results found for: PROBNP  Specialty Problems      Pulmonary Problems   Allergic rhinitis    Qualifier: Diagnosis of  By: [name] RN, [name]        COPD (chronic obstructive pulmonary disease) (HCC)           Cough   Vocal cord dysfunction   Chronic respiratory failure with hypoxia (HCC)   Acute non-recurrent ethmoidal sinusitis      Allergies  Allergen Reactions  . Lisinopril     cough  . Sulfonamide Derivatives Rash    Immunization History  Administered Date(s) Administered  . Influenza Whole 12/09/2008, 10/09/2009, 10/02/2010, 09/10/2011  . Influenza, High Dose Seasonal PF 10/07/2016, 10/15/2017, 08/22/2018  . Influenza,inj,Quad PF,6+ Mos 10/01/2012, 10/08/2013, 09/30/2014, 10/11/2015  . Pneumococcal Conjugate-13 10/09/2013  . Pneumococcal-Unspecified 10/02/2010  . Zoster Recombinat (Shingrix) 02/27/2018, 07/01/2018    Past Medical History:  Diagnosis Date  . Allergic rhinitis, cause unspecified   . CAD in native artery 07/25/2017  . Chronic airway obstruction, not elsewhere classified   . Depressive disorder, not elsewhere classified   . Nephritis and nephropathy, not specified as acute or chronic, with unspecified pathological lesion in kidney   . Obesity, unspecified   . Other and unspecified hyperlipidemia   . Oxygen deficiency   . Thyrotoxicosis without mention of goiter or other cause, without mention of thyrotoxic crisis or storm   . Unspecified essential hypertension   . UTI (urinary tract infection) 2019 dec    Tobacco History: Social History   Tobacco Use  Smoking Status Former Smoker  . Packs/day: 2.00  . Years: 30.00  . Pack years: 60.00  . Quit date: 01/09/1994  . Years since  quitting: 25.8  Smokeless Tobacco Never Used   Counseling given: Not Answered   Continue to not smoke  Outpatient Encounter Medications as of 11/18/2019  Medication Sig  . albuterol (PROAIR HFA) 108 (90 Base) MCG/ACT inhaler INHALE 2 PUFFS INTO THE LUNGS EVERY 6 (SIX) HOURS AS NEEDED FOR WHEEZING OR SHORTNESS OF BREATH. (Patient taking differently: Inhale 2 puffs into the lungs every 6 (six) hours as needed for wheezing or shortness of breath. )  . albuterol (PROVENTIL) (2.5 MG/3ML) 0.083% nebulizer solution Take 3 mLs (2.5 mg total) by nebulization every 4 (four) hours as needed for wheezing or shortness of breath (DX: COPD 496).  . Ascorbic Acid (VITAMIN C) 1000 MG tablet Take 1,000 mg by mouth daily.    Marland Kitchen atenolol (TENORMIN) 50 MG tablet Take 50 mg by mouth daily.    Marland Kitchen atorvastatin (LIPITOR) 20 MG tablet Take 20 mg by mouth daily.    . Calcium Carbonate-Vitamin D (CALTRATE 600+D) 600-400  MG-UNIT per tablet Take 2 tablets by mouth daily.    Marland Kitchen. esomeprazole (NEXIUM) 40 MG capsule TAKE 1 CAPSULE (40 MG TOTAL) BY MOUTH 2 (TWO) TIMES DAILY BEFORE A MEAL.  . ferrous sulfate 325 (65 FE) MG tablet Take 325 mg by mouth daily.  . fexofenadine (ALLEGRA) 180 MG tablet Take 180 mg by mouth daily.  . fluticasone (FLONASE) 50 MCG/ACT nasal spray SPRAY 2 SPRAYS INTO EACH NOSTRIL EVERY DAY  . levothyroxine (SYNTHROID) 88 MCG tablet Take 88 mcg by mouth daily.  Marland Kitchen. losartan (COZAAR) 25 MG tablet Take 25 mg by mouth daily.  . metFORMIN (GLUCOPHAGE-XR) 500 MG 24 hr tablet Take 500 mg by mouth daily.  . montelukast (SINGULAIR) 10 MG tablet Take 1 tablet (10 mg total) by mouth at bedtime.  . Multiple Vitamins-Minerals (CENTRUM PO) Take 1 tablet by mouth daily.    Marland Kitchen. omeprazole (PRILOSEC) 20 MG capsule TAKE 1 CAPSULE (20 MG TOTAL) BY MOUTH 2 (TWO) TIMES DAILY BEFORE A MEAL.  Marland Kitchen. ONE TOUCH ULTRA TEST test strip As directed  . Tiotropium Bromide-Olodaterol (STIOLTO RESPIMAT) 2.5-2.5 MCG/ACT AERS Inhale 2 puffs into  the lungs daily.  . traZODone (DESYREL) 50 MG tablet Take 50 mg by mouth at bedtime.   Marland Kitchen. venlafaxine XR (EFFEXOR-XR) 150 MG 24 hr capsule Take 150 mg by mouth daily.   . [DISCONTINUED] azithromycin (ZITHROMAX) 250 MG tablet Take 2 tablets today and then 1 tablet daily until gone.  . [DISCONTINUED] doxycycline (VIBRA-TABS) 100 MG tablet Take 1 tablet (100 mg total) by mouth 2 (two) times daily.  Marland Kitchen. amoxicillin-clavulanate (AUGMENTIN) 875-125 MG tablet Take 1 tablet by mouth 2 (two) times daily.  . Budeson-Glycopyrrol-Formoterol (BREZTRI AEROSPHERE) 160-9-4.8 MCG/ACT AERO Inhale 2 puffs into the lungs 2 (two) times daily.  . nitroGLYCERIN (NITROSTAT) 0.4 MG SL tablet Place 1 tablet (0.4 mg total) under the tongue every 5 (five) minutes as needed for chest pain.  . predniSONE (DELTASONE) 10 MG tablet 4 tabs for 2 days, then 3 tabs for 2 days, 2 tabs for 2 days, then 1 tab for 2 days, then stop   No facility-administered encounter medications on file as of 11/18/2019.     Review of Systems  Review of Systems  Constitutional: Positive for fatigue. Negative for activity change and fever.  HENT: Positive for congestion (yellow mucous ), postnasal drip and sinus pressure. Negative for sinus pain and sore throat.   Respiratory: Positive for cough and shortness of breath. Negative for wheezing.   Cardiovascular: Negative for chest pain and palpitations.  Gastrointestinal: Negative for diarrhea, nausea and vomiting.  Musculoskeletal: Negative for arthralgias.  Neurological: Negative for dizziness.  Psychiatric/Behavioral: Negative for sleep disturbance. The patient is not nervous/anxious.      Physical Exam  BP 120/80 (BP Location: Left Arm, Cuff Size: Normal)   Pulse 74   Temp 98.1 F (36.7 C) (Oral)   Ht 4\' 11"  (1.499 m)   Wt 150 lb (68 kg)   SpO2 96%   BMI 30.30 kg/m   Wt Readings from Last 5 Encounters:  11/18/19 150 lb (68 kg)  07/02/19 149 lb 1.6 oz (67.6 kg)  04/16/19 149 lb (67.6  kg)  12/09/18 151 lb (68.5 kg)  07/30/18 150 lb 6.4 oz (68.2 kg)    BMI Readings from Last 5 Encounters:  11/18/19 30.30 kg/m  07/02/19 29.12 kg/m  04/16/19 29.10 kg/m  12/09/18 29.49 kg/m  07/30/18 29.37 kg/m     Physical Exam Vitals and nursing note reviewed.  Constitutional:      General: She is not in acute distress.    Appearance: Normal appearance. She is obese.  HENT:     Head: Normocephalic and atraumatic.     Right Ear: Tympanic membrane, ear canal and external ear normal. There is impacted cerumen.     Left Ear: Tympanic membrane, ear canal and external ear normal. There is no impacted cerumen.     Ears:     Comments: Hearing aids bilaterally    Nose: Rhinorrhea present. No congestion.     Right Sinus: Maxillary sinus tenderness present.     Left Sinus: Maxillary sinus tenderness present.     Comments: Maxillary and ethmoid sinus tenderness to palpation    Mouth/Throat:     Mouth: Mucous membranes are moist.     Pharynx: Oropharynx is clear.     Comments: +pnd Eyes:     Pupils: Pupils are equal, round, and reactive to light.  Cardiovascular:     Rate and Rhythm: Normal rate and regular rhythm.     Pulses: Normal pulses.     Heart sounds: Normal heart sounds. No murmur heard.   Pulmonary:     Effort: Pulmonary effort is normal. No respiratory distress.     Breath sounds: No decreased air movement. No decreased breath sounds, wheezing or rales.     Comments: Diminished breath sounds throughout exam Musculoskeletal:     Cervical back: Normal range of motion.  Skin:    General: Skin is warm and dry.     Capillary Refill: Capillary refill takes less than 2 seconds.  Neurological:     General: No focal deficit present.     Mental Status: She is alert and oriented to person, place, and time. Mental status is at baseline.     Gait: Gait normal.  Psychiatric:        Mood and Affect: Mood normal.        Behavior: Behavior normal.        Thought Content:  Thought content normal.        Judgment: Judgment normal.       Assessment & Plan:   COPD (chronic obstructive pulmonary disease) (HCC) Plan: Trial of Breztri given eosinophilia on lab work as well as recent COPD exacerbations Hold Stiolto Respimat Patient to contact our office and let us know how she is doing on the samples If she is tolerating well we will send in a prescription Follow-up in 2 months  Acute non-recurrent ethmoidal sinusitis Plan: Augmentin BID for next 7 days Prednisone taper today Resume nasal saline rinses twice daily  Healthcare maintenance Plan: Recommend COVID-19 booster, patient declines  Chronic respiratory failure with hypoxia (HCC) Plan: Continue oxygen therapy    Return in about 2 months (around 01/18/2020), or if symptoms worsen or fail to improve, for Follow up with Dr. Delton Coombes.   [name], NP 11/18/2019   This appointment required 32 minutes of patient care (this includes precharting, chart review, review of results, face-to-face care, etc.).

## 2019-11-18 NOTE — Assessment & Plan Note (Signed)
Plan: Augmentin BID for next 7 days Prednisone taper today Resume nasal saline rinses twice daily

## 2019-11-18 NOTE — Patient Instructions (Addendum)
You were seen today by [name], NP  for:   Happy birthday.  I am sorry that you are not feeling well.  It was a pleasure seeing you today.  Please let us know how you are doing on the samples that we have provided.  As discussed today we will also acutely treat you for sinusitis given your length of symptoms.  We will treat you with Augmentin as well as a short course of prednisone.  Take care and stay safe,  Alanzo Lamb  1. Acute non-recurrent ethmoidal sinusitis  - amoxicillin-clavulanate (AUGMENTIN) 875-125 MG tablet; Take 1 tablet by mouth 2 (two) times daily.  Dispense: 14 tablet; Refill: 0 - predniSONE (DELTASONE) 10 MG tablet; 4 tabs for 2 days, then 3 tabs for 2 days, 2 tabs for 2 days, then 1 tab for 2 days, then stop  Dispense: 20 tablet; Refill: 0  Augmentin >>> Take 1 875-125 mg tablet every 12 hours for the next 7 days >>> Take with food  Prednisone 10mg  tablet  >>>4 tabs for 2 days, then 3 tabs for 2 days, 2 tabs for 2 days, then 1 tab for 2 days, then stop >>>take with food  >>>take in the morning   Start nasal saline rinses twice daily Use distilled water Shake well Get bottle lukewarm like a baby bottle  2. Chronic obstructive pulmonary disease, unspecified COPD type (HCC)  - predniSONE (DELTASONE) 10 MG tablet; 4 tabs for 2 days, then 3 tabs for 2 days, 2 tabs for 2 days, then 1 tab for 2 days, then stop  Dispense: 20 tablet; Refill: 0  Trial of Breztri >>> 2 puffs in the morning right when you wake up, rinse out your mouth after use, 12 hours later 2 puffs, rinse after use >>> Take this daily, no matter what >>> This is not a rescue inhaler   When you have completed the first 2 samples that we have provided please let our office know how you are doing.  If you are tolerating this well and prefer this to Stiolto please let know we will send in a prescription to investigate cost  Do not take Stiolto Respimat when utilizing this inhaler as listed  above  Only use your albuterol as a rescue medication to be used if you can't catch your breath by resting or doing a relaxed purse lip breathing pattern.  - The less you use it, the better it will work when you need it. - Ok to use up to 2 puffs  every 4 hours if you must but call for immediate appointment if use goes up over your usual need - Don't leave home without it !!  (think of it like the spare tire for your car)    3. Chronic respiratory failure with hypoxia (HCC)  Continue oxygen therapy as prescribed  >>>maintain oxygen saturations greater than 88 percent  >>>if unable to maintain oxygen saturations please contact the office  >>>do not smoke with oxygen  >>>can use nasal saline gel or nasal saline rinses to moisturize nose if oxygen causes dryness  4. Healthcare maintenance  Great job in receiving your first COVID-19 injections as well as your flu vaccine  Our office does recommend that you obtain the COVID-19 booster 6 months after your initial COVID-19 injections     We recommend today:   Meds ordered this encounter  Medications  . amoxicillin-clavulanate (AUGMENTIN) 875-125 MG tablet    Sig: Take 1 tablet by mouth 2 (two)  times daily.    Dispense:  14 tablet    Refill:  0  . predniSONE (DELTASONE) 10 MG tablet    Sig: 4 tabs for 2 days, then 3 tabs for 2 days, 2 tabs for 2 days, then 1 tab for 2 days, then stop    Dispense:  20 tablet    Refill:  0    Follow Up:    Return in about 2 months (around 01/18/2020), or if symptoms worsen or fail to improve, for Follow up with Dr. Delton Coombes.   Notification of test results are managed in the following manner: If there are  any recommendations or changes to the  plan of care discussed in office today,  we will contact you and let you know what they are. If you do not hear from [place], then your results are normal and you can view them through your  MyChart account , or a letter will be sent to you. Thank you again for trusting  [place] with your care  - Thank you, [facility]    It is flu season:   >>> Best ways to protect herself from the flu: Receive the yearly flu vaccine, practice good hand hygiene washing with soap and also using hand sanitizer when available, eat a nutritious meals, get adequate rest, hydrate appropriately       Please contact the office if your symptoms worsen or you have concerns that you are not improving.   Thank you for choosing [facility] for your healthcare, and for allowing [place] to partner with you on your healthcare journey. I am thankful to be able to provide care to you today.   Elisha Headland FNP-C

## 2019-11-20 ENCOUNTER — Ambulatory Visit
Admission: RE | Admit: 2019-11-20 | Discharge: 2019-11-20 | Disposition: A | Discharge status: Home / Self Care | Payer: HMO | Source: Ambulatory Visit | Attending: Family Medicine | Admitting: Family Medicine

## 2019-11-20 ENCOUNTER — Other Ambulatory Visit: Payer: Self-pay

## 2019-11-20 DIAGNOSIS — Z1231 Encounter for screening mammogram for malignant neoplasm of breast: Secondary | ICD-10-CM

## 2019-11-22 DIAGNOSIS — J449 Chronic obstructive pulmonary disease, unspecified: Secondary | ICD-10-CM | POA: Diagnosis not present

## 2019-12-10 ENCOUNTER — Ambulatory Visit: Payer: HMO

## 2019-12-10 ENCOUNTER — Telehealth: Payer: Self-pay | Admitting: Pulmonary Disease

## 2019-12-10 DIAGNOSIS — J449 Chronic obstructive pulmonary disease, unspecified: Secondary | ICD-10-CM

## 2019-12-10 NOTE — Telephone Encounter (Signed)
I called and spoke with the pt and notified of response per Dr Delton Coombes  She verbalized understanding and is aware to arrive 30 min prior for cxr- ordered stat

## 2019-12-10 NOTE — Telephone Encounter (Signed)
Called and spoke with patient who states that she started having some shortness of breath this morning and just finished her 3rd breathing treatment today with no relief. Has a cough with cream color sputum with dry hack, states she is not sure if she has a fever. Patient was given Augmentin and Prednisone taper on 11/18/19 and finished them. LOV was 11/18/19   Dr. Delton Coombes please advise

## 2019-12-10 NOTE — Telephone Encounter (Signed)
If she hasn't responded to pred and augmentin, then she will need to be seen by APP to evaluate further. Needs a CXR at that visit.

## 2019-12-11 ENCOUNTER — Other Ambulatory Visit: Payer: Self-pay

## 2019-12-11 ENCOUNTER — Encounter: Payer: Self-pay | Admitting: Pulmonary Disease

## 2019-12-11 ENCOUNTER — Ambulatory Visit (INDEPENDENT_AMBULATORY_CARE_PROVIDER_SITE_OTHER): Payer: HMO

## 2019-12-11 ENCOUNTER — Other Ambulatory Visit: Payer: Self-pay | Admitting: Emergency Medicine

## 2019-12-11 ENCOUNTER — Ambulatory Visit: Payer: HMO | Admitting: Pulmonary Disease

## 2019-12-11 VITALS — BP 136/74 | HR 96 | Temp 98.2°F | Ht 59.0 in | Wt 146.0 lb

## 2019-12-11 DIAGNOSIS — R918 Other nonspecific abnormal finding of lung field: Secondary | ICD-10-CM

## 2019-12-11 DIAGNOSIS — J449 Chronic obstructive pulmonary disease, unspecified: Secondary | ICD-10-CM

## 2019-12-11 DIAGNOSIS — R0602 Shortness of breath: Secondary | ICD-10-CM | POA: Diagnosis not present

## 2019-12-11 DIAGNOSIS — J9611 Chronic respiratory failure with hypoxia: Secondary | ICD-10-CM | POA: Diagnosis not present

## 2019-12-11 MED ORDER — PREDNISONE 10 MG PO TABS: ORAL_TABLET | ORAL | 0 refills | Status: AC

## 2019-12-11 NOTE — Assessment & Plan Note (Signed)
Plan: Continue Breztri  Follow-up in 4 weeks Prednisone taper to start tomorrow Depo-Medrol injection today

## 2019-12-11 NOTE — Progress Notes (Signed)
@Patient  ID: , female    DOB: [birth year], 74 y.o.   MRN: [id number]  Chief Complaint  Patient presents with  . Follow-up    reports increased dyspnea onset yesterday    Referring provider: [id number], MD  HPI:  74 year old female former smoker followed in our office for moderate COPD, chronic cough, irritable upper airway syndrome, chronic respiratory failure  PMH: GERD, allergic rhinitis, obesity Smoker/ Smoking History: Former smoker. Quit 1996. 60 pack year smoker.  Maintenance: Breztri Pt of: Dr. 61  12/11/2019  - Visit   74 year old female former smoker followed in our office for COPD, chronic cough and irritable upper airway syndrome and chronic respiratory failure.  She is established with Dr. 61.  She was last seen in November/2021 by [facility] NP.  At that office visit it was recommended that she trial Breztri, start Augmentin as well as a prednisone taper.  Patient reported to our office on 12/10/2019 that she is having shortness of breath and worsened cough and congestion with a dry hack.  She was recommended have follow-up with our office with a chest x-ray patient presenting today due to those recommendations  Chest x-ray today does not show an overt pneumonia.  Does show mild interstitial prominence pneumonitis cannot be ruled out.  We will discuss this today. Also could be acute bronchitis.  Patient reports that productive cough continues to persist.  Sputum color has improved from a dark yellow or brown to a white.  She also reports that she is having a lot of nasal drainage.  She feels that she has improved congestion since using nasal saline rinses.  She is only utilize nasal saline rinses 2 times in the last few weeks.  We will discuss this today.    Patient reports she remains adherent to her inhalers.  She actually prefers 14/01/2019 to Clinical cytogeneticist.  Unfortunately though she is only been utilizing Breztri 2 puffs daily instead of 2 puffs twice daily as  prescribed.  We will review this today.  Questionaires / Pulmonary Flowsheets:   ACT:  No flowsheet data found.  MMRC: No flowsheet data found.  Epworth:  No flowsheet data found.  Tests:   09/04/2018-CBC with differential-eosinophils absolute 0.4, eosinophils relative 6  01/01/2018-chest x-ray-small lingular opacity noted which may reflect sequela of prior pneumonia  07/27/2017-echocardiogram-LV ejection fraction 60 to 65%, grade 1 diastolic dysfunction, systolic pressure within normal range   FENO:  No results found for: NITRICOXIDE  PFT: No flowsheet data found.  WALK:  SIX MIN WALK 07/31/2016 07/31/2016 07/31/2016  Supplimental Oxygen during Test? (L/min) Yes Yes No  O2 Flow Rate 3 2 -  Type Pulse Pulse -    Imaging: DG Chest 2 View  Result Date: 12/11/2019 CLINICAL DATA:  Shortness of breath.  COPD. EXAM: CHEST - 2 VIEW COMPARISON:  Chest x-ray 11/18/2019.  01/01/2018. FINDINGS: Mediastinum and hilar structures normal. Bilateral mild interstitial prominence. Pneumonitis cannot be excluded. No pleural effusion or pneumothorax. Focal right lateral pleural thickening unchanged from prior study of 11/18/2019, new from 01/01/2018. This could represent pleural scarring however follow-up chest x-rays in 3- 6 months recommended to demonstrate stability. Stable tiny density noted over the posterior right second rib, most likely bone island or overlying granuloma. No acute bony abnormality. IMPRESSION: 1. Bilateral mild interstitial prominence. Pneumonitis cannot be excluded. 2. Focal right lateral pleural thickening unchanged from prior study of 11/18/2019, new from 01/01/2018. This could represent pleural scarring however follow-up chest x-rays in 3- 6 months  recommended to demonstrate stability. Electronically Signed   By: Maisie Fus  Register   On: 12/11/2019 12:20   DG Chest 2 View  Result Date: 11/18/2019 CLINICAL DATA:  Shortness of breath. EXAM: CHEST - 2 VIEW COMPARISON:  September 05, 2019. FINDINGS: The heart size and mediastinal contours are within normal limits. Both lungs are clear. No pneumothorax or pleural effusion is noted. The visualized skeletal structures are unremarkable. IMPRESSION: No active cardiopulmonary disease. Electronically Signed   By: Lupita Raider M.D.   On: 11/18/2019 15:35   MM 3D SCREEN BREAST BILATERAL  Result Date: 11/25/2019 CLINICAL DATA:  Screening. EXAM: DIGITAL SCREENING BILATERAL MAMMOGRAM WITH TOMO AND CAD COMPARISON:  Previous exam(s). ACR Breast Density Category b: There are scattered areas of fibroglandular density. FINDINGS: There are no findings suspicious for malignancy. Images were processed with CAD. IMPRESSION: No mammographic evidence of malignancy. A result letter of this screening mammogram will be mailed directly to the patient. RECOMMENDATION: Screening mammogram in one year. (Code:[id number]) BI-RADS CATEGORY  1: Negative. Electronically Signed   By: Sherian Rein M.D.   On: 11/25/2019 10:02    Lab Results:  CBC    Component Value Date/Time   WBC 10.9 (H) 07/02/2019 1458   WBC 9.6 01/01/2018 1145   RBC 4.18 07/02/2019 1458   HGB 12.7 07/02/2019 1458   HGB 12.5 02/21/2017 1428   HCT 39.4 07/02/2019 1458   HCT 38.6 02/21/2017 1428   PLT 276 07/02/2019 1458   PLT 252 02/21/2017 1428   MCV 94.3 07/02/2019 1458   MCV 91 02/21/2017 1428   MCH 30.4 07/02/2019 1458   MCHC 32.2 07/02/2019 1458   RDW 13.4 07/02/2019 1458   RDW 13.7 02/21/2017 1428   LYMPHSABS 2.3 07/02/2019 1458   LYMPHSABS 1.6 02/21/2017 1428   MONOABS 0.8 07/02/2019 1458   EOSABS 0.4 07/02/2019 1458   EOSABS 0.4 02/21/2017 1428   BASOSABS 0.1 07/02/2019 1458   BASOSABS 0.0 02/21/2017 1428    BMET    Component Value Date/Time   NA 138 01/01/2018 1145   NA 146 (H) 02/21/2017 1428   K 4.1 01/01/2018 1145   CL 106 01/01/2018 1145   CO2 23 01/01/2018 1145   GLUCOSE 112 (H) 01/01/2018 1145   BUN 22 01/01/2018 1145   BUN 7 (L) 02/21/2017 1428    CREATININE 1.27 (H) 01/01/2018 1145   CALCIUM 8.7 (L) 01/01/2018 1145   GFRNONAA 42 (L) 01/01/2018 1145   GFRAA 48 (L) 01/01/2018 1145    BNP No results found for: BNP  ProBNP No results found for: PROBNP  Specialty Problems      Pulmonary Problems   Allergic rhinitis    Qualifier: Diagnosis of  By: [name] RN, Cicero Duck        COPD (chronic obstructive pulmonary disease) (HCC)           Cough   Vocal cord dysfunction   Chronic respiratory failure with hypoxia (HCC)   Acute non-recurrent ethmoidal sinusitis      Allergies  Allergen Reactions  . Lisinopril     cough  . Sulfonamide Derivatives Rash    Immunization History  Administered Date(s) Administered  . Influenza Whole 12/09/2008, 10/09/2009, 10/02/2010, 09/10/2011  . Influenza, High Dose Seasonal PF 10/07/2016, 10/15/2017, 08/22/2018  . Influenza,inj,Quad PF,6+ Mos 10/01/2012, 10/08/2013, 09/30/2014, 10/11/2015  . Pneumococcal Conjugate-13 10/09/2013  . Pneumococcal-Unspecified 10/02/2010  . Zoster Recombinat (Shingrix) 02/27/2018, 07/01/2018    Past Medical History:  Diagnosis Date  . Allergic rhinitis, cause unspecified   .  CAD in native artery 07/25/2017  . Chronic airway obstruction, not elsewhere classified   . Depressive disorder, not elsewhere classified   . Nephritis and nephropathy, not specified as acute or chronic, with unspecified pathological lesion in kidney   . Obesity, unspecified   . Other and unspecified hyperlipidemia   . Oxygen deficiency   . Thyrotoxicosis without mention of goiter or other cause, without mention of thyrotoxic crisis or storm   . Unspecified essential hypertension   . UTI (urinary tract infection) 2019 dec    Tobacco History: Social History   Tobacco Use  Smoking Status Former Smoker  . Packs/day: 2.00  . Years: 30.00  . Pack years: 60.00  . Quit date: 01/09/1994  . Years since quitting: 25.9  Smokeless Tobacco Never Used   Counseling given: Not  Answered   Continue to not smoke  Outpatient Encounter Medications as of 12/11/2019  Medication Sig  . albuterol (PROAIR HFA) 108 (90 Base) MCG/ACT inhaler INHALE 2 PUFFS INTO THE LUNGS EVERY 6 (SIX) HOURS AS NEEDED FOR WHEEZING OR SHORTNESS OF BREATH. (Patient taking differently: Inhale 2 puffs into the lungs every 6 (six) hours as needed for wheezing or shortness of breath. )  . albuterol (PROVENTIL) (2.5 MG/3ML) 0.083% nebulizer solution Take 3 mLs (2.5 mg total) by nebulization every 4 (four) hours as needed for wheezing or shortness of breath (DX: COPD 496).  Marland Kitchen amoxicillin-clavulanate (AUGMENTIN) 875-125 MG tablet Take 1 tablet by mouth 2 (two) times daily.  . Ascorbic Acid (VITAMIN C) 1000 MG tablet Take 1,000 mg by mouth daily.    Marland Kitchen atenolol (TENORMIN) 50 MG tablet Take 50 mg by mouth daily.    Marland Kitchen atorvastatin (LIPITOR) 20 MG tablet Take 20 mg by mouth daily.    . Budeson-Glycopyrrol-Formoterol (BREZTRI AEROSPHERE) 160-9-4.8 MCG/ACT AERO Inhale 2 puffs into the lungs 2 (two) times daily.  . Calcium Carbonate-Vitamin D (CALTRATE 600+D) 600-400 MG-UNIT per tablet Take 2 tablets by mouth daily.    Marland Kitchen esomeprazole (NEXIUM) 40 MG capsule TAKE 1 CAPSULE (40 MG TOTAL) BY MOUTH 2 (TWO) TIMES DAILY BEFORE A MEAL.  . ferrous sulfate 325 (65 FE) MG tablet Take 325 mg by mouth daily.  . fexofenadine (ALLEGRA) 180 MG tablet Take 180 mg by mouth daily.  . fluticasone (FLONASE) 50 MCG/ACT nasal spray SPRAY 2 SPRAYS INTO EACH NOSTRIL EVERY DAY  . levothyroxine (SYNTHROID) 88 MCG tablet Take 88 mcg by mouth daily.  Marland Kitchen losartan (COZAAR) 25 MG tablet Take 25 mg by mouth daily.  . metFORMIN (GLUCOPHAGE-XR) 500 MG 24 hr tablet Take 500 mg by mouth daily.  . montelukast (SINGULAIR) 10 MG tablet Take 1 tablet (10 mg total) by mouth at bedtime.  . Multiple Vitamins-Minerals (CENTRUM PO) Take 1 tablet by mouth daily.    Marland Kitchen omeprazole (PRILOSEC) 20 MG capsule TAKE 1 CAPSULE (20 MG TOTAL) BY MOUTH 2 (TWO) TIMES DAILY  BEFORE A MEAL.  Marland Kitchen ONE TOUCH ULTRA TEST test strip As directed  . Tiotropium Bromide-Olodaterol (STIOLTO RESPIMAT) 2.5-2.5 MCG/ACT AERS Inhale 2 puffs into the lungs daily.  . traZODone (DESYREL) 50 MG tablet Take 50 mg by mouth at bedtime.   Marland Kitchen venlafaxine XR (EFFEXOR-XR) 150 MG 24 hr capsule Take 150 mg by mouth daily.   . [DISCONTINUED] predniSONE (DELTASONE) 10 MG tablet 4 tabs for 2 days, then 3 tabs for 2 days, 2 tabs for 2 days, then 1 tab for 2 days, then stop  . nitroGLYCERIN (NITROSTAT) 0.4 MG SL tablet Place 1  tablet (0.4 mg total) under the tongue every 5 (five) minutes as needed for chest pain.  . predniSONE (DELTASONE) 10 MG tablet Take 4 tablets (40 mg total) by mouth daily with breakfast for 3 days, THEN 3 tablets (30 mg total) daily with breakfast for 3 days, THEN 2 tablets (20 mg total) daily with breakfast for 3 days, THEN 1 tablet (10 mg total) daily with breakfast for 3 days.   No facility-administered encounter medications on file as of 12/11/2019.     Review of Systems  Review of Systems  Constitutional: Positive for appetite change, chills, diaphoresis and fatigue. Negative for activity change and fever.  HENT: Positive for congestion, postnasal drip, sinus pressure and sinus pain. Negative for sore throat.   Respiratory: Positive for cough and shortness of breath. Negative for wheezing.   Cardiovascular: Negative for chest pain and palpitations.  Gastrointestinal: Negative for diarrhea, nausea and vomiting.  Musculoskeletal: Negative for arthralgias.  Neurological: Negative for dizziness.  Psychiatric/Behavioral: Negative for sleep disturbance. The patient is not nervous/anxious.      Physical Exam  BP 136/74   Pulse 96   Temp 98.2 F (36.8 C)   Ht 4\' 11"  (1.499 m)   Wt 146 lb (66.2 kg)   SpO2 99%   BMI 29.49 kg/m   Wt Readings from Last 5 Encounters:  12/11/19 146 lb (66.2 kg)  11/18/19 150 lb (68 kg)  07/02/19 149 lb 1.6 oz (67.6 kg)  04/16/19 149 lb  (67.6 kg)  12/09/18 151 lb (68.5 kg)    BMI Readings from Last 5 Encounters:  12/11/19 29.49 kg/m  11/18/19 30.30 kg/m  07/02/19 29.12 kg/m  04/16/19 29.10 kg/m  12/09/18 29.49 kg/m     Physical Exam Vitals and nursing note reviewed.  Constitutional:      General: She is not in acute distress.    Appearance: Normal appearance. She is obese.  HENT:     Head: Normocephalic and atraumatic.     Right Ear: Tympanic membrane, ear canal and external ear normal. There is no impacted cerumen.     Left Ear: Tympanic membrane, ear canal and external ear normal. There is no impacted cerumen.     Nose: Rhinorrhea present. No congestion.     Mouth/Throat:     Mouth: Mucous membranes are moist.     Pharynx: Oropharynx is clear.     Comments: Postnasal drip Eyes:     Pupils: Pupils are equal, round, and reactive to light.  Cardiovascular:     Rate and Rhythm: Normal rate and regular rhythm.     Pulses: Normal pulses.     Heart sounds: Normal heart sounds. No murmur heard.   Pulmonary:     Effort: Pulmonary effort is normal. No respiratory distress.     Breath sounds: No decreased air movement. Wheezing present. No decreased breath sounds or rales.  Musculoskeletal:     Cervical back: Normal range of motion.  Skin:    General: Skin is warm and dry.     Capillary Refill: Capillary refill takes less than 2 seconds.  Neurological:     General: No focal deficit present.     Mental Status: She is alert and oriented to person, place, and time. Mental status is at baseline.     Gait: Gait normal.  Psychiatric:        Mood and Affect: Mood normal.        Behavior: Behavior normal.        Thought Content:  Thought content normal.        Judgment: Judgment normal.       Assessment & Plan:   Chronic respiratory failure with hypoxia (HCC) Plan: Continue oxygen therapy  COPD (chronic obstructive pulmonary disease) (HCC) Plan: Continue Breztri  Follow-up in 4 weeks Prednisone  taper to start tomorrow Depo-Medrol injection today   Abnormal findings on diagnostic imaging of lung Reviewed chest x-ray with patient No obvious pneumonia Potential pneumonitis No new recent medications Could also be related to cough, congestion and bronchitis  Plan: Depo-Medrol injection today Start prednisone taper tomorrow We will plan on repeating a chest x-ray at least 3 months    Return in about 4 weeks (around 01/08/2020), or if symptoms worsen or fail to improve, for Follow up with Dr. Delton CoombesByrum, Follow up with Elisha HeadlandBrian Babygirl Trager FNP-C.   Coral CeoBrian P Dominique Calvey, NP 12/11/2019   This appointment required 32 minutes of patient care (this includes precharting, chart review, review of results, face-to-face care, etc.).

## 2019-12-11 NOTE — Assessment & Plan Note (Signed)
Reviewed chest x-ray with patient No obvious pneumonia Potential pneumonitis No new recent medications Could also be related to cough, congestion and bronchitis  Plan: Depo-Medrol injection today Start prednisone taper tomorrow We will plan on repeating a chest x-ray at least 3 months

## 2019-12-11 NOTE — Assessment & Plan Note (Signed)
Plan: Continue oxygen therapy 

## 2019-12-11 NOTE — Patient Instructions (Addendum)
You were seen today by [name], NP  for:   1. Chronic obstructive pulmonary disease, unspecified COPD type (HCC)  - predniSONE (DELTASONE) 10 MG tablet; Take 4 tablets (40 mg total) by mouth daily with breakfast for 3 days, THEN 3 tablets (30 mg total) daily with breakfast for 3 days, THEN 2 tablets (20 mg total) daily with breakfast for 3 days, THEN 1 tablet (10 mg total) daily with breakfast for 3 days.  Dispense: 30 tablet; Refill: 0  Depo 80 today   Prednisone 10mg  tablet  >>>4 tabs for 3 days, then 3 tabs for 3 days, 2 tabs for 3 days, then 1 tab for 3 days, then stop >>>take with food  >>>take in the morning  >>>start 12/12/19  Breztri >>> 2 puffs in the morning right when you wake up, rinse out your mouth after use, 12 hours later 2 puffs, rinse after use >>> Take this daily, no matter what >>> This is not a rescue inhaler   Continue nasal saline rinses at least daily Use distilled water Shake well Get bottle lukewarm like a baby bottle  Continue Allegra Continue Singulair Continue Flonase after nasal saline rinses  Continue rescue inhaler or nebulized albuterol every 6-8 hours as needed for shortness of breath or wheezing  Note your daily symptoms > remember "red flags" for COPD:   >>>Increase in cough >>>increase in sputum production >>>increase in shortness of breath or activity  intolerance.   If you notice these symptoms, please call the office to be seen.   2. Chronic respiratory failure with hypoxia (HCC)  Continue oxygen therapy as prescribed  >>>maintain oxygen saturations greater than 88 percent  >>>if unable to maintain oxygen saturations please contact the office  >>>do not smoke with oxygen  >>>can use nasal saline gel or nasal saline rinses to moisturize nose if oxygen causes dryness  3. Abnormal findings on diagnostic imaging of lung  We will plan on repeat chest x-ray imaging at least in 3 months   We recommend today:   Meds ordered this  encounter  Medications  . predniSONE (DELTASONE) 10 MG tablet    Sig: Take 4 tablets (40 mg total) by mouth daily with breakfast for 3 days, THEN 3 tablets (30 mg total) daily with breakfast for 3 days, THEN 2 tablets (20 mg total) daily with breakfast for 3 days, THEN 1 tablet (10 mg total) daily with breakfast for 3 days.    Dispense:  30 tablet    Refill:  0    Follow Up:    Return in about 4 weeks (around 01/08/2020), or if symptoms worsen or fail to improve, for Follow up with Dr. 01/10/2020, Follow up with Delton Coombes FNP-C.   Notification of test results are managed in the following manner: If there are  any recommendations or changes to the  plan of care discussed in office today,  we will contact you and let you know what they are. If you do not hear from Elisha Headland, then your results are normal and you can view them through your  MyChart account , or a letter will be sent to you. Thank you again for trusting [place] with your care  - Thank you,  Pulmonary    It is flu season:   >>> Best ways to protect herself from the flu: Receive the yearly flu vaccine, practice good hand hygiene washing with soap and also using hand sanitizer when available, eat a nutritious meals, get adequate rest, hydrate  appropriately       Please contact the office if your symptoms worsen or you have concerns that you are not improving.   Thank you for choosing [facility] for your healthcare, and for allowing [place] to partner with you on your healthcare journey. I am thankful to be able to provide care to you today.   Elisha Headland FNP-C

## 2019-12-12 ENCOUNTER — Other Ambulatory Visit: Payer: Self-pay | Admitting: Emergency Medicine

## 2019-12-14 DIAGNOSIS — Z20822 Contact with and (suspected) exposure to covid-19: Secondary | ICD-10-CM | POA: Diagnosis not present

## 2019-12-14 DIAGNOSIS — U071 COVID-19: Secondary | ICD-10-CM | POA: Diagnosis not present

## 2019-12-14 DIAGNOSIS — R509 Fever, unspecified: Secondary | ICD-10-CM | POA: Diagnosis not present

## 2019-12-15 ENCOUNTER — Telehealth: Payer: Self-pay | Admitting: Pulmonary Disease

## 2019-12-15 ENCOUNTER — Other Ambulatory Visit: Payer: Self-pay

## 2019-12-15 NOTE — Patient Outreach (Signed)
  [facility]) Care Management Chronic Special Needs Program    12/15/2019  Name: Jaclyne Haverstick, DOB: 1945/07/27  MRN: [id number]   Ms. Reghan Thul is enrolled in a chronic special needs plan for Diabetes.  [facility] [employer] Care Management Team has assumed care and services for this member. Case closed by [facility] Care Management  Dudley Major RN, [facility], CDE Chronic Care Management Coordinator [facility] Care Management [phone]

## 2019-12-15 NOTE — Telephone Encounter (Signed)
12/15/2019  We will email monoclonal antibody team: [email or web address]  Pt has been contacted and notified.   We will route to Dr. Delton Coombes as Lorain Childes as this is this patient.  Nothing further needed at this time.   Elisha Headland FNP

## 2019-12-16 ENCOUNTER — Other Ambulatory Visit: Payer: Self-pay | Admitting: Physician Assistant

## 2019-12-16 DIAGNOSIS — J42 Unspecified chronic bronchitis: Secondary | ICD-10-CM

## 2019-12-16 DIAGNOSIS — I10 Essential (primary) hypertension: Secondary | ICD-10-CM

## 2019-12-16 DIAGNOSIS — E7800 Pure hypercholesterolemia, unspecified: Secondary | ICD-10-CM

## 2019-12-16 DIAGNOSIS — E119 Type 2 diabetes mellitus without complications: Secondary | ICD-10-CM

## 2019-12-16 DIAGNOSIS — J9611 Chronic respiratory failure with hypoxia: Secondary | ICD-10-CM

## 2019-12-16 DIAGNOSIS — U071 COVID-19: Secondary | ICD-10-CM

## 2019-12-16 DIAGNOSIS — I2510 Atherosclerotic heart disease of native coronary artery without angina pectoris: Secondary | ICD-10-CM

## 2019-12-16 NOTE — Progress Notes (Signed)
I connected by phone with [name] [name] on 12/16/2019 at 1:50 PM to discuss the potential use of a new treatment for mild to moderate COVID-19 viral infection in non-hospitalized patients.  This patient is a 74 y.o. female that meets the FDA criteria for Emergency Use Authorization of COVID monoclonal antibody casirivimab/imdevimab, bamlanivimab/eteseviamb, or sotrovimab.  Has a (+) direct SARS-CoV-2 viral test result  Has mild or moderate COVID-19   Is NOT hospitalized due to COVID-19  Is within 10 days of symptom onset  Has at least one of the high risk factor(s) for progression to severe COVID-19 and/or hospitalization as defined in EUA.  Specific high risk criteria : Older age (>/= 75 yo), BMI > 25, Diabetes, Cardiovascular disease or hypertension and Chronic Lung Disease On 3L oxygen chronically   I have spoken and communicated the following to the patient or parent/caregiver regarding COVID monoclonal antibody treatment:  1. FDA has authorized the emergency use for the treatment of mild to moderate COVID-19 in adults and pediatric patients with positive results of direct SARS-CoV-2 viral testing who are 41 years of age and older weighing at least 40 kg, and who are at high risk for progressing to severe COVID-19 and/or hospitalization.  2. The significant known and potential risks and benefits of COVID monoclonal antibody, and the extent to which such potential risks and benefits are unknown.  3. Information on available alternative treatments and the risks and benefits of those alternatives, including clinical trials.  4. Patients treated with COVID monoclonal antibody should continue to self-isolate and use infection control measures (e.g., wear mask, isolate, social distance, avoid sharing personal items, clean and disinfect "high touch" surfaces, and frequent handwashing) according to CDC guidelines.   5. The patient or parent/caregiver has the option to accept or refuse  COVID monoclonal antibody treatment.  After reviewing this information with the patient, the patient has agreed to receive one of the available covid 19 monoclonal antibodies and will be provided an appropriate fact sheet prior to infusion.   [place], [place] 12/16/2019 1:50 PM

## 2019-12-17 ENCOUNTER — Ambulatory Visit (HOSPITAL_COMMUNITY)
Admission: RE | Admit: 2019-12-17 | Discharge: 2019-12-17 | Disposition: A | Discharge status: Home / Self Care | Payer: Medicare Other | Source: Ambulatory Visit | Attending: Pulmonary Disease | Admitting: Pulmonary Disease

## 2019-12-17 DIAGNOSIS — Z23 Encounter for immunization: Secondary | ICD-10-CM | POA: Diagnosis not present

## 2019-12-17 DIAGNOSIS — U071 COVID-19: Secondary | ICD-10-CM | POA: Insufficient documentation

## 2019-12-17 MED ORDER — FAMOTIDINE IN NACL 20-0.9 MG/50ML-% IV SOLN: 20.0000 mg | Freq: Once | INTRAVENOUS | Status: DC | PRN

## 2019-12-17 MED ORDER — ALBUTEROL SULFATE HFA 108 (90 BASE) MCG/ACT IN AERS: 2.0000 | INHALATION_SPRAY | Freq: Once | RESPIRATORY_TRACT | Status: DC | PRN

## 2019-12-17 MED ORDER — SODIUM CHLORIDE 0.9 % IV SOLN: INTRAVENOUS | Status: DC | PRN

## 2019-12-17 MED ORDER — EPINEPHRINE 0.3 MG/0.3ML IJ SOAJ: 0.3000 mg | Freq: Once | INTRAMUSCULAR | Status: DC | PRN

## 2019-12-17 MED ORDER — METHYLPREDNISOLONE SODIUM SUCC 125 MG IJ SOLR: 125.0000 mg | Freq: Once | INTRAMUSCULAR | Status: DC | PRN

## 2019-12-17 MED ORDER — DIPHENHYDRAMINE HCL 50 MG/ML IJ SOLN: 50.0000 mg | Freq: Once | INTRAMUSCULAR | Status: DC | PRN

## 2019-12-17 MED ORDER — SODIUM CHLORIDE 0.9 % IV SOLN
1200.0000 mg | Freq: Once | INTRAVENOUS | Status: AC
  Administered 2019-12-17: 1200 mg via INTRAVENOUS

## 2019-12-17 NOTE — Progress Notes (Signed)
Patient reviewed Fact Sheet for Patients, Parents, and Caregivers for Emergency Use Authorization (EUA) of REGEN-COV2 for the Treatment of Coronavirus. Patient also reviewed and is agreeable to the estimated cost of treatment. Patient is agreeable to proceed.    

## 2019-12-17 NOTE — Discharge Instructions (Signed)
10 Things You Can Do to Manage Your COVID-19 Symptoms at Home If you have possible or confirmed COVID-19: 1. Stay home from work and school. And stay away from other public places. If you must go out, avoid using any kind of public transportation, ridesharing, or taxis. 2. Monitor your symptoms carefully. If your symptoms get worse, call your healthcare provider immediately. 3. Get rest and stay hydrated. 4. If you have a medical appointment, call the healthcare provider ahead of time and tell them that you have or may have COVID-19. 5. For medical emergencies, call 911 and notify the dispatch personnel that you have or may have COVID-19. 6. Cover your cough and sneezes with a tissue or use the inside of your elbow. 7. Wash your hands often with soap and water for at least 20 seconds or clean your hands with an alcohol-based hand sanitizer that contains at least 60% alcohol. 8. As much as possible, stay in a specific room and away from other people in your home. Also, you should use a separate bathroom, if available. If you need to be around other people in or outside of the home, wear a mask. 9. Avoid sharing personal items with other people in your household, like dishes, towels, and bedding. 10. Clean all surfaces that are touched often, like counters, tabletops, and doorknobs. Use household cleaning sprays or wipes according to the label instructions. cdc.gov/coronavirus 07/10/2018 This information is not intended to replace advice given to you by your health care provider. Make sure you discuss any questions you have with your health care provider. Document Revised: 12/12/2018 Document Reviewed: 12/12/2018 Elsevier Patient Education  2020 Elsevier Inc. What types of side effects do monoclonal antibody drugs cause?  Common side effects  In general, the more common side effects caused by monoclonal antibody drugs include: . Allergic reactions, such as hives or itching . Flu-like signs and  symptoms, including chills, fatigue, fever, and muscle aches and pains . Nausea, vomiting . Diarrhea . Skin rashes . Low blood pressure   The CDC is recommending patients who receive monoclonal antibody treatments wait at least 90 days before being vaccinated.  Currently, there are no data on the safety and efficacy of mRNA COVID-19 vaccines in persons who received monoclonal antibodies or convalescent plasma as part of COVID-19 treatment. Based on the estimated half-life of such therapies as well as evidence suggesting that reinfection is uncommon in the 90 days after initial infection, vaccination should be deferred for at least 90 days, as a precautionary measure until additional information becomes available, to avoid interference of the antibody treatment with vaccine-induced immune responses. If you have any questions or concerns after the infusion please call the Advanced Practice Provider on call at [phone]. This number is ONLY intended for your use regarding questions or concerns about the infusion post-treatment side-effects.  Please do not provide this number to others for use. For return to work notes please contact your primary care provider.   If someone you know is interested in receiving treatment please have them call the COVID hotline at [phone].   

## 2019-12-17 NOTE — Progress Notes (Signed)
  Diagnosis: COVID-19  Physician: Dr. Wright  Procedure: [facility] Med: casirivimab\imdevimab infusion - Provided patient with casirivimab\imdevimab fact sheet for patients, parents and caregivers prior to infusion.  Complications: No immediate complications noted.  Discharge: Discharged home   [name] [name] 12/17/2019   

## 2019-12-22 DIAGNOSIS — J449 Chronic obstructive pulmonary disease, unspecified: Secondary | ICD-10-CM | POA: Diagnosis not present

## 2019-12-27 DIAGNOSIS — T162XXA Foreign body in left ear, initial encounter: Secondary | ICD-10-CM | POA: Diagnosis not present

## 2020-01-10 ENCOUNTER — Other Ambulatory Visit: Payer: Self-pay | Admitting: Emergency Medicine

## 2020-01-22 DIAGNOSIS — J449 Chronic obstructive pulmonary disease, unspecified: Secondary | ICD-10-CM | POA: Diagnosis not present

## 2020-02-04 DIAGNOSIS — J441 Chronic obstructive pulmonary disease with (acute) exacerbation: Secondary | ICD-10-CM | POA: Diagnosis not present

## 2020-02-04 DIAGNOSIS — J069 Acute upper respiratory infection, unspecified: Secondary | ICD-10-CM | POA: Diagnosis not present

## 2020-02-22 DIAGNOSIS — J449 Chronic obstructive pulmonary disease, unspecified: Secondary | ICD-10-CM | POA: Diagnosis not present

## 2020-02-23 ENCOUNTER — Telehealth: Payer: Self-pay | Admitting: Emergency Medicine

## 2020-02-23 NOTE — Telephone Encounter (Signed)
02/23/2020  Patient recently had COVID-19 in December/2021 and received monoclonal antibody infusion.  Patient remains unvaccinated COVID-19.  Would recommend inperson physical exam at urgent care, pcp, or our office with chest xray.   Elisha Headland FNP

## 2020-02-23 NOTE — Telephone Encounter (Signed)
Primary Pulmonologist: [facility] Last office visit and with whom: 12/11/19 with Arlys John  What do we see them for (pulmonary problems): COPD Last OV assessment/plan: 1. Chronic obstructive pulmonary disease, unspecified COPD type (HCC)  - predniSONE (DELTASONE) 10 MG tablet; Take 4 tablets (40 mg total) by mouth daily with breakfast for 3 days, THEN 3 tablets (30 mg total) daily with breakfast for 3 days, THEN 2 tablets (20 mg total) daily with breakfast for 3 days, THEN 1 tablet (10 mg total) daily with breakfast for 3 days.  Dispense: 30 tablet; Refill: 0  Depo 80 today   Prednisone 10mg  tablet  >>>4 tabs for 3 days, then 3 tabs for 3 days, 2 tabs for 3 days, then 1 tab for 3 days, then stop >>>take with food  >>>take in the morning  >>>start 12/12/19  Breztri >>> 2 puffs in the morning right when you wake up, rinse out your mouth after use, 12 hours later 2 puffs, rinse after use >>> Take this daily, no matter what >>> This is not a rescue inhaler  Continue nasal saline rinses at least daily Use distilled water Shake well Get bottle lukewarm like a baby bottle  Continue Allegra Continue Singulair Continue Flonase after nasal saline rinses  Continue rescue inhaler or nebulized albuterol every 6-8 hours as needed for shortness of breath or wheezing  Note your daily symptoms >remember "red flags" for COPD:  >>>Increase in cough >>>increase in sputum production >>>increase in shortness of breath or activity  intolerance.   If you notice these symptoms, please call the office to be seen.  Was appointment offered to patient (explain)?  Patient wanted recommendations first    Reason for call: Spoke with patient. She stated that she has been more SOB over the past few days. She has a productive cough with yellow phlegm. Increased wheezing. While on the phone, she checked her O2 and it was 97% on 3L pulse O2. HR was 96. She denied any chills but stated yesterday she felt cold so  she put on a pair of sweatpants. About 10 minutes later, she was "burning up". She did not check her temp but has not felt this way since then. Denied any chest pain or back pain.   She denied being around anyone sick within the last few days. Also denies any recent covid testing since she tested positive for covid back in December.   She has been using her Breztri twice daily as well as the albuterol neb solution. Denied using any Mucinex or OTC cough syrup.   (examples of things to ask: : When did symptoms start? Fever? Cough? Productive? Color to sputum? More sputum than usual? Wheezing? Have you needed increased oxygen? Are you taking your respiratory medications? What over the counter measures have you tried?)  Allergies  Allergen Reactions  . Lisinopril     cough  . Sulfonamide Derivatives Rash    Immunization History  Administered Date(s) Administered  . Influenza Whole 12/09/2008, 10/09/2009, 10/02/2010, 09/10/2011  . Influenza, High Dose Seasonal PF 10/07/2016, 10/15/2017, 08/22/2018  . Influenza,inj,Quad PF,6+ Mos 10/01/2012, 10/08/2013, 09/30/2014, 10/11/2015  . Pneumococcal Conjugate-13 10/09/2013  . Pneumococcal-Unspecified 10/02/2010  . Zoster Recombinat (Shingrix) 02/27/2018, 07/01/2018    She is requesting recommendations from [place] since [place] is not here.  Pharmacy is [facility] in [place] on [address].   [place], can you please advise? Thanks!

## 2020-02-23 NOTE — Telephone Encounter (Signed)
Spoke with patient. She is ok with coming to our office. She has been scheduled for 3pm tomorrow with Arlys John.  Nothing further needed at time of call.

## 2020-02-24 ENCOUNTER — Encounter: Payer: Self-pay | Admitting: Pulmonary Disease

## 2020-02-24 ENCOUNTER — Other Ambulatory Visit: Payer: Self-pay

## 2020-02-24 ENCOUNTER — Ambulatory Visit: Payer: HMO | Admitting: Pulmonary Disease

## 2020-02-24 ENCOUNTER — Ambulatory Visit (INDEPENDENT_AMBULATORY_CARE_PROVIDER_SITE_OTHER): Payer: HMO

## 2020-02-24 VITALS — BP 142/88 | HR 125 | Temp 97.2°F | Ht 59.0 in | Wt 141.6 lb

## 2020-02-24 DIAGNOSIS — Z86711 Personal history of pulmonary embolism: Secondary | ICD-10-CM | POA: Diagnosis not present

## 2020-02-24 DIAGNOSIS — J9611 Chronic respiratory failure with hypoxia: Secondary | ICD-10-CM | POA: Diagnosis not present

## 2020-02-24 DIAGNOSIS — J449 Chronic obstructive pulmonary disease, unspecified: Secondary | ICD-10-CM

## 2020-02-24 DIAGNOSIS — R0602 Shortness of breath: Secondary | ICD-10-CM | POA: Diagnosis not present

## 2020-02-24 DIAGNOSIS — R059 Cough, unspecified: Secondary | ICD-10-CM | POA: Diagnosis not present

## 2020-02-24 LAB — D-DIMER, QUANTITATIVE: D-Dimer, Quant: 1.12 mcg/mL FEU — ABNORMAL HIGH (ref <0.50)

## 2020-02-24 MED ORDER — BREZTRI AEROSPHERE 160-9-4.8 MCG/ACT IN AERO: 2.0000 | INHALATION_SPRAY | Freq: Two times a day (BID) | RESPIRATORY_TRACT | 0 refills | Status: DC

## 2020-02-24 MED ORDER — AZITHROMYCIN 250 MG PO TABS: ORAL_TABLET | ORAL | 0 refills | Status: DC

## 2020-02-24 MED ORDER — PREDNISONE 10 MG PO TABS: ORAL_TABLET | ORAL | 0 refills | Status: AC

## 2020-02-24 NOTE — Assessment & Plan Note (Signed)
History of PE Not managed on a blood thinners Tachycardic today Wells PE criteria intermediate  Plan: Lab work May need to consider CTA chest based off of D-dimer results

## 2020-02-24 NOTE — Assessment & Plan Note (Signed)
Plan: Continue Breztri  Follow-up in 4 weeks Prednisone taper Zpak today  Chest Xray  Lab work today

## 2020-02-24 NOTE — Assessment & Plan Note (Signed)
Walk today in office patient had exertional hypoxemia despite oxygen use Patient tachycardic History of PE Wells criteria intermediate for PE  Discussion: Explained to patient that likely she has a COPD exacerbation.  I do still have concerns that she may have a potential PE.  We will do lab work today.  Patient is aware to proceed to emergency room or contact 911 if she has sudden increased chest pain or severe worsening shortness of breath.  She is aware she may need to obtain a CT after lab results have been completed  Plan: Lab work today Chest x-ray today May need to consider CTA chest

## 2020-02-24 NOTE — Patient Instructions (Addendum)
You were seen today by Lauraine Rinne, NP  for:   1. Shortness of breath  - D-Dimer, Quantitative; Future - Comp Met (CMET); Future - CBC with Differential/Platelet; Future - Pro b natriuretic peptide (BNP); Future  Chest x-ray today  Lab work today  2. Chronic obstructive pulmonary disease, unspecified COPD type ([place])  - DG Chest 2 View; Future - azithromycin (ZITHROMAX) 250 MG tablet; 567m (two tablets) today, then 2514m(1 tablet) for the next 4 days  Dispense: 6 tablet; Refill: 0 - predniSONE (DELTASONE) 10 MG tablet; Take 4 tablets (40 mg total) by mouth daily with breakfast for 3 days, THEN 3 tablets (30 mg total) daily with breakfast for 3 days, THEN 2 tablets (20 mg total) daily with breakfast for 3 days, THEN 1 tablet (10 mg total) daily with breakfast for 3 days.  Dispense: 30 tablet; Refill: 0  3. History of pulmonary embolus (PE)  Lab work today  As discussed today if your lab work is elevated there may be concerns that you may have another pulmonary embolism.  This would require you to have a CT Angio in the outpatient setting.  We will call you soon as we have these results   We recommend today:  Orders Placed This Encounter  Procedures  . DG Chest 2 View    Standing Status:   Future    Number of Occurrences:   1    Standing Expiration Date:   06/23/2020    Order Specific Question:   Reason for Exam (SYMPTOM  OR DIAGNOSIS REQUIRED)    Answer:   copd, cough    Order Specific Question:   Preferred imaging location?    Answer:   Internal    Order Specific Question:   Radiology Contrast Protocol - do NOT remove file path    Answer:   [email or web address]  . D-Dimer, Quantitative    Standing Status:   Future    Number of Occurrences:   1    Standing Expiration Date:   02/23/2021  . Comp Met (CMET)    Standing Status:   Future    Number of Occurrences:   1    Standing Expiration Date:   02/23/2021  . CBC with  Differential/Platelet    Standing Status:   Future    Number of Occurrences:   1    Standing Expiration Date:   02/23/2021  . Pro b natriuretic peptide (BNP)    Standing Status:   Future    Number of Occurrences:   1    Standing Expiration Date:   02/23/2021   Orders Placed This Encounter  Procedures  . DG Chest 2 View  . D-Dimer, Quantitative  . Comp Met (CMET)  . CBC with Differential/Platelet  . Pro b natriuretic peptide (BNP)   Meds ordered this encounter  Medications  . azithromycin (ZITHROMAX) 250 MG tablet    Sig: 50042mtwo tablets) today, then 250m30m tablet) for the next 4 days    Dispense:  6 tablet    Refill:  0  . predniSONE (DELTASONE) 10 MG tablet    Sig: Take 4 tablets (40 mg total) by mouth daily with breakfast for 3 days, THEN 3 tablets (30 mg total) daily with breakfast for 3 days, THEN 2 tablets (20 mg total) daily with breakfast for 3 days, THEN 1 tablet (10 mg total) daily with breakfast for 3 days.    Dispense:  30 tablet    Refill:  0  Follow Up:    Return in about 16 days (around 03/11/2020), or if symptoms worsen or fail to improve, for Follow up with Dr. Lamonte Sakai.   Notification of test results are managed in the following manner: If there are  any recommendations or changes to the  plan of care discussed in office today,  we will contact you and let you know what they are. If you do not hear from [place], then your results are normal and you can view them through your  MyChart account , or a letter will be sent to you. Thank you again for trusting [place] with your care  - Thank you, [facility]    It is flu season:   >>> Best ways to protect herself from the flu: Receive the yearly flu vaccine, practice good hand hygiene washing with soap and also using hand sanitizer when available, eat a nutritious meals, get adequate rest, hydrate appropriately       Please contact the office if your symptoms worsen or you have concerns that you are not  improving.   Thank you for choosing  [facility] for your healthcare, and for allowing [place] to partner with you on your healthcare journey. I am thankful to be able to provide care to you today.   Wyn Quaker FNP-C

## 2020-02-24 NOTE — Assessment & Plan Note (Signed)
Plan: Continue oxygen therapy 

## 2020-02-24 NOTE — Progress Notes (Signed)
@Patient  ID: [name] PebblesPatricia Ann [name], female    DOB: 02/06/1945, 75 y.o.   MRN: [id number]  Chief Complaint  Patient presents with  . Acute Visit    Patient reports increased SOB. 88% on 3L pulsed, HR 125. Sat in chair for about 2 minutes oxygen came up to        . She reports increased SOB started Saturday and gradually has gotten worse. Reports using albuterol several times a day. Reports using her nebulizer 3x/day. Confirms that she is using stiolto as directed. Reports cough with yellow mucous. Denies fevers.     Referring provider: Shirlean MylarWebb, Carol, MD  HPI:  75 year old female former smoker followed in our office for moderate COPD, chronic cough, irritable upper airway syndrome, chronic respiratory failure  PMH: GERD, allergic rhinitis, obesity Smoker/ Smoking History: Former smoker. Quit 1996. 60 pack year smoker.  Maintenance: Breztri Pt of: Dr. Delton CoombesByrum  02/24/2020  - Visit   75 year old female former smoker [facility] office for COPD.  Patient presenting today as an acute visit.  She contacted our office reporting she had worsening shortness of breath over the last 3 days.  Upon arrival to our office on ambulation patient's oxygen level dropped to 86%.  Patient's heart rate is elevated and tachycardic.  This is a change for her.  She has had to use her rescue inhaler 3-4 times daily.  She reports adherence to her inhalers.  She feels that she may need prednisone.  Patient has a history of pulmonary embolism.  She is not on a DOAC or blood thinner.  The Wells criteria below:  Wells Criteria Modified Wells criteria: Clinical assessment for PE Clinical symptoms of DVT (leg swelling, pain with palpation) - 3 Other diagnoses less likely than pulmonary embolism - 3 Heart rate greater than 100 - 1.5 Immobilization (disease greater in 3 days) or surgery in the previous 4 weeks - 1.5 Previous DVT/PE - 1.5 Hemoptysis - 1 Malignancy - 1  Probability Traditional clinical probability assessment  (Wells criteria) High - greater than 6 Moderate - 2 to 6 Low less than 2  Simplify clinical probability assessment (modified Wells criteria) PE likely-greater than 4 PE unlikely little less than or equal to 4  Pretest probability of DVT (Wells score)  Clinical features: Active cancer (treatment ongoing or within the previous 6 months or palliative) -1 Paralysis, paralysis, or recent plaster immobilization of the lower extremities -1 Recently bedridden for more than 3 days or major surgery within 4 weeks -1 Localized tenderness along the distribution of the deep vein system -1 Entire leg swollen -1 Calf swelling by more than 3 cm when compared to the asymptomatic leg (measured below tibial tuberosity) -1 Pitting edema (greater and symptomatic leg) -1 Collateral superficial veins -1 Alternative diagnosis as likely or more than likely that of a deep vein thrombosis    -2   High probability - 3 or greater Moderate probability -  1 or 2 Low probability - 0 or less  Modification  DVT likely-2 or greater DVT unlikely-1 or less  Score: 0   Questionaires / Pulmonary Flowsheets:   ACT:  No flowsheet data found.  MMRC: mMRC Dyspnea Scale mMRC Score  02/24/2020 3    Epworth:  No flowsheet data found.  Tests:   FENO:  No results found for: NITRICOXIDE  PFT: No flowsheet data found.  WALK:  SIX MIN WALK 07/31/2016 07/31/2016 07/31/2016  Supplimental Oxygen during Test? (L/min) Yes Yes No  O2 Flow Rate 3 2 -  Type Pulse Pulse -    Imaging: DG Chest 2 View  Result Date: 02/24/2020 CLINICAL DATA:  COPD.  Cough. EXAM: CHEST - 2 VIEW COMPARISON:  12/11/2019.  11/18/2019. FINDINGS: Mediastinum and hilar structures normal. Heart size normal. Mild bilateral interstitial prominence again. A component these changes may be chronic. Mild pneumonitis cannot be excluded. Tiny bilateral pleural effusions versus pleural scarring noted. No pneumothorax. Degenerative change thoracic  spine. IMPRESSION: Mild bilateral interstitial prominence. A component of these changes may be chronic. Mild pneumonitis cannot be excluded. Tiny bilateral pleural effusions versus pleural scarring noted. Electronically Signed   By: Maisie Fus  Register   On: 02/24/2020 16:29    Lab Results:  CBC    Component Value Date/Time   WBC 10.9 (H) 07/02/2019 1458   WBC 9.6 01/01/2018 1145   RBC 4.18 07/02/2019 1458   HGB 12.7 07/02/2019 1458   HGB 12.5 02/21/2017 1428   HCT 39.4 07/02/2019 1458   HCT 38.6 02/21/2017 1428   PLT 276 07/02/2019 1458   PLT 252 02/21/2017 1428   MCV 94.3 07/02/2019 1458   MCV 91 02/21/2017 1428   MCH 30.4 07/02/2019 1458   MCHC 32.2 07/02/2019 1458   RDW 13.4 07/02/2019 1458   RDW 13.7 02/21/2017 1428   LYMPHSABS 2.3 07/02/2019 1458   LYMPHSABS 1.6 02/21/2017 1428   MONOABS 0.8 07/02/2019 1458   EOSABS 0.4 07/02/2019 1458   EOSABS 0.4 02/21/2017 1428   BASOSABS 0.1 07/02/2019 1458   BASOSABS 0.0 02/21/2017 1428    BMET    Component Value Date/Time   NA 138 01/01/2018 1145   NA 146 (H) 02/21/2017 1428   K 4.1 01/01/2018 1145   CL 106 01/01/2018 1145   CO2 23 01/01/2018 1145   GLUCOSE 112 (H) 01/01/2018 1145   BUN 22 01/01/2018 1145   BUN 7 (L) 02/21/2017 1428   CREATININE 1.27 (H) 01/01/2018 1145   CALCIUM 8.7 (L) 01/01/2018 1145   GFRNONAA 42 (L) 01/01/2018 1145   GFRAA 48 (L) 01/01/2018 1145    BNP No results found for: BNP  ProBNP No results found for: PROBNP  Specialty Problems      Pulmonary Problems   Allergic rhinitis    Qualifier: Diagnosis of  By: [name] RN, Cicero Duck        COPD (chronic obstructive pulmonary disease) (HCC)           Cough   Vocal cord dysfunction   Chronic respiratory failure with hypoxia (HCC)   Acute non-recurrent ethmoidal sinusitis   Shortness of breath      Allergies  Allergen Reactions  . Lisinopril     cough  . Sulfonamide Derivatives Rash    Immunization History  Administered Date(s)  Administered  . Influenza Whole 12/09/2008, 10/09/2009, 10/02/2010, 09/10/2011  . Influenza, High Dose Seasonal PF 10/07/2016, 10/15/2017, 08/22/2018  . Influenza,inj,Quad PF,6+ Mos 10/01/2012, 10/08/2013, 09/30/2014, 10/11/2015  . Pneumococcal Conjugate-13 10/09/2013  . Pneumococcal-Unspecified 10/02/2010  . Zoster Recombinat (Shingrix) 02/27/2018, 07/01/2018    Past Medical History:  Diagnosis Date  . Allergic rhinitis, cause unspecified   . CAD in native artery 07/25/2017  . Chronic airway obstruction, not elsewhere classified   . Depressive disorder, not elsewhere classified   . Nephritis and nephropathy, not specified as acute or chronic, with unspecified pathological lesion in kidney   . Obesity, unspecified   . Other and unspecified hyperlipidemia   . Oxygen deficiency   . Thyrotoxicosis without mention of goiter or other cause, without mention of thyrotoxic  crisis or storm   . Unspecified essential hypertension   . UTI (urinary tract infection) 2019 dec    Tobacco History: Social History   Tobacco Use  Smoking Status Former Smoker  . Packs/day: 2.00  . Years: 30.00  . Pack years: 60.00  . Quit date: 01/09/1994  . Years since quitting: 26.1  Smokeless Tobacco Never Used   Counseling given: Not Answered   Continue to not smoke  Outpatient Encounter Medications as of 02/24/2020  Medication Sig  . albuterol (PROVENTIL) (2.5 MG/3ML) 0.083% nebulizer solution Take 3 mLs (2.5 mg total) by nebulization every 4 (four) hours as needed for wheezing or shortness of breath (DX: COPD 496).  Marland Kitchen amoxicillin-clavulanate (AUGMENTIN) 875-125 MG tablet Take 1 tablet by mouth 2 (two) times daily.  . Ascorbic Acid (VITAMIN C) 1000 MG tablet Take 1,000 mg by mouth daily.  Marland Kitchen atenolol (TENORMIN) 50 MG tablet Take 50 mg by mouth daily.  Marland Kitchen atorvastatin (LIPITOR) 20 MG tablet Take 20 mg by mouth daily.  Marland Kitchen azithromycin (ZITHROMAX) 250 MG tablet  (two tablets) today, then  (1 tablet)  for the next 4 days  . Budeson-Glycopyrrol-Formoterol (BREZTRI AEROSPHERE) 160-9-4.8 MCG/ACT AERO Inhale 2 puffs into the lungs 2 (two) times daily.  . Budeson-Glycopyrrol-Formoterol (BREZTRI AEROSPHERE) 160-9-4.8 MCG/ACT AERO Inhale 2 puffs into the lungs in the morning and at bedtime.  . Calcium Carbonate-Vitamin D 600-400 MG-UNIT tablet Take 2 tablets by mouth daily.  Marland Kitchen esomeprazole (NEXIUM) 40 MG capsule TAKE 1 CAPSULE (40 MG TOTAL) BY MOUTH 2 (TWO) TIMES DAILY BEFORE A MEAL.  . ferrous sulfate 325 (65 FE) MG tablet Take 325 mg by mouth daily.  . fexofenadine (ALLEGRA) 180 MG tablet Take 180 mg by mouth daily.  . fluticasone (FLONASE) 50 MCG/ACT nasal spray SPRAY 2 SPRAYS INTO EACH NOSTRIL EVERY DAY  . levothyroxine (SYNTHROID) 88 MCG tablet Take 88 mcg by mouth daily.  Marland Kitchen losartan (COZAAR) 25 MG tablet Take 25 mg by mouth daily.  . metFORMIN (GLUCOPHAGE-XR) 500 MG 24 hr tablet Take 500 mg by mouth daily.  . montelukast (SINGULAIR) 10 MG tablet Take 1 tablet (10 mg total) by mouth at bedtime.  . Multiple Vitamins-Minerals (CENTRUM PO) Take 1 tablet by mouth daily.  Marland Kitchen omeprazole (PRILOSEC) 20 MG capsule TAKE 1 CAPSULE (20 MG TOTAL) BY MOUTH 2 (TWO) TIMES DAILY BEFORE A MEAL.  Marland Kitchen ONE TOUCH ULTRA TEST test strip As directed  . predniSONE (DELTASONE) 10 MG tablet Take 4 tablets (40 mg total) by mouth daily with breakfast for 3 days, THEN 3 tablets (30 mg total) daily with breakfast for 3 days, THEN 2 tablets (20 mg total) daily with breakfast for 3 days, THEN 1 tablet (10 mg total) daily with breakfast for 3 days.  . Tiotropium Bromide-Olodaterol (STIOLTO RESPIMAT) 2.5-2.5 MCG/ACT AERS Inhale 2 puffs into the lungs daily.  . traZODone (DESYREL) 50 MG tablet Take 50 mg by mouth at bedtime.   Marland Kitchen venlafaxine XR (EFFEXOR-XR) 150 MG 24 hr capsule Take 150 mg by mouth daily.   . [DISCONTINUED] Budeson-Glycopyrrol-Formoterol (BREZTRI AEROSPHERE) 160-9-4.8 MCG/ACT AERO Inhale 2 puffs into the lungs 2 (two)  times daily.  . nitroGLYCERIN (NITROSTAT) 0.4 MG SL tablet Place 1 tablet (0.4 mg total) under the tongue every 5 (five) minutes as needed for chest pain.  Marland Kitchen PROVENTIL HFA 108 (90 Base) MCG/ACT inhaler INHALE 2 PUFFS BY MOUTH INTO LUNGS EVERY SIX HOURS AS NEEDED FOR WHEEZING OR SHORTNESS OF BREATH   No facility-administered encounter medications on  file as of 02/24/2020.     Review of Systems  Review of Systems  Constitutional: Positive for fatigue. Negative for activity change and fever.  HENT: Positive for congestion. Negative for sinus pressure, sinus pain and sore throat.   Respiratory: Positive for cough, shortness of breath and wheezing.   Cardiovascular: Negative for chest pain and palpitations.  Gastrointestinal: Negative for diarrhea, nausea and vomiting.  Musculoskeletal: Negative for arthralgias.  Neurological: Negative for dizziness.  Psychiatric/Behavioral: Negative for sleep disturbance. The patient is not nervous/anxious.      Physical Exam  BP (!) 142/88 (BP Location: Left Arm)   Pulse (!) 125   Temp (!) 97.2 F (36.2 C) (Oral)   Ht 4\' 11"  (1.499 m)   Wt 141 lb 9.6 oz (64.2 kg)   SpO2 92% Comment: 3L Pulsed  BMI 28.60 kg/m   Wt Readings from Last 5 Encounters:  02/24/20 141 lb 9.6 oz (64.2 kg)  12/11/19 146 lb (66.2 kg)  11/18/19 150 lb (68 kg)  07/02/19 149 lb 1.6 oz (67.6 kg)  04/16/19 149 lb (67.6 kg)    BMI Readings from Last 5 Encounters:  02/24/20 28.60 kg/m  12/11/19 29.49 kg/m  11/18/19 30.30 kg/m  07/02/19 29.12 kg/m  04/16/19 29.10 kg/m     Physical Exam Vitals and nursing note reviewed.  Constitutional:      General: She is not in acute distress.    Appearance: Normal appearance. She is obese.  HENT:     Head: Normocephalic and atraumatic.     Right Ear: External ear normal.     Left Ear: External ear normal.     Nose: Nose normal. No congestion.     Mouth/Throat:     Mouth: Mucous membranes are moist.     Pharynx:  Oropharynx is clear.  Eyes:     Pupils: Pupils are equal, round, and reactive to light.  Cardiovascular:     Rate and Rhythm: Regular rhythm. Tachycardia present.     Pulses: Normal pulses.     Heart sounds: Normal heart sounds. No murmur heard.   Pulmonary:     Effort: Pulmonary effort is normal. No respiratory distress.     Breath sounds: No decreased air movement. Wheezing present. No decreased breath sounds or rales.  Musculoskeletal:     Cervical back: Normal range of motion.  Skin:    General: Skin is warm and dry.     Capillary Refill: Capillary refill takes less than 2 seconds.  Neurological:     General: No focal deficit present.     Mental Status: She is alert and oriented to person, place, and time. Mental status is at baseline.     Gait: Gait normal.  Psychiatric:        Mood and Affect: Mood normal.        Behavior: Behavior normal.        Thought Content: Thought content normal.        Judgment: Judgment normal.       Assessment & Plan:   Chronic respiratory failure with hypoxia (HCC) Plan: Continue oxygen therapy  COPD (chronic obstructive pulmonary disease) (HCC) Plan: Continue Breztri  Follow-up in 4 weeks Prednisone taper Zpak today  Chest Xray  Lab work today     History of pulmonary embolus (PE) History of PE Not managed on a blood thinners Tachycardic today Wells PE criteria intermediate  Plan: Lab work May need to consider CTA chest based off of D-dimer results  Shortness of  breath Walk today in office patient had exertional hypoxemia despite oxygen use Patient tachycardic History of PE Wells criteria intermediate for PE  Discussion: Explained to patient that likely she has a COPD exacerbation.  I do still have concerns that she may have a potential PE.  We will do lab work today.  Patient is aware to proceed to emergency room or contact 911 if she has sudden increased chest pain or severe worsening shortness of breath.  She is  aware she may need to obtain a CT after lab results have been completed  Plan: Lab work today Chest x-ray today May need to consider CTA chest    Return in about 16 days (around 03/11/2020), or if symptoms worsen or fail to improve, for Follow up with Dr. Delton Coombes.   [name], NP 02/24/2020   This appointment required 34 minutes of patient care (this includes precharting, chart review, review of results, face-to-face care, etc.).

## 2020-02-25 ENCOUNTER — Other Ambulatory Visit: Payer: Self-pay | Admitting: Pulmonary Disease

## 2020-02-25 ENCOUNTER — Ambulatory Visit (HOSPITAL_COMMUNITY)
Admission: RE | Admit: 2020-02-25 | Discharge: 2020-02-25 | Disposition: A | Discharge status: Home / Self Care | Payer: HMO | Source: Ambulatory Visit | Attending: Pulmonary Disease | Admitting: Pulmonary Disease

## 2020-02-25 DIAGNOSIS — R7989 Other specified abnormal findings of blood chemistry: Secondary | ICD-10-CM

## 2020-02-25 DIAGNOSIS — R0602 Shortness of breath: Secondary | ICD-10-CM | POA: Diagnosis not present

## 2020-02-25 DIAGNOSIS — R079 Chest pain, unspecified: Secondary | ICD-10-CM

## 2020-02-25 DIAGNOSIS — K449 Diaphragmatic hernia without obstruction or gangrene: Secondary | ICD-10-CM | POA: Diagnosis not present

## 2020-02-25 LAB — CBC WITH DIFFERENTIAL/PLATELET
Basophils Absolute: 0.1 10*3/uL (ref 0.0–0.1)
Basophils Relative: 0.9 % (ref 0.0–3.0)
Eosinophils Absolute: 0.3 10*3/uL (ref 0.0–0.7)
Eosinophils Relative: 3.3 % (ref 0.0–5.0)
HCT: 39.7 % (ref 36.0–46.0)
Hemoglobin: 13.3 g/dL (ref 12.0–15.0)
Lymphocytes Relative: 13.6 % (ref 12.0–46.0)
Lymphs Abs: 1.3 10*3/uL (ref 0.7–4.0)
MCHC: 33.6 g/dL (ref 30.0–36.0)
MCV: 93 fl (ref 78.0–100.0)
Monocytes Absolute: 0.7 10*3/uL (ref 0.1–1.0)
Monocytes Relative: 7.7 % (ref 3.0–12.0)
Neutro Abs: 7.2 10*3/uL (ref 1.4–7.7)
Neutrophils Relative %: 74.5 % (ref 43.0–77.0)
Platelets: 335 10*3/uL (ref 150.0–400.0)
RBC: 4.27 Mil/uL (ref 3.87–5.11)
RDW: 12.8 % (ref 11.5–15.5)
WBC: 9.6 10*3/uL (ref 4.0–10.5)

## 2020-02-25 LAB — COMPREHENSIVE METABOLIC PANEL
ALT: 15 U/L (ref 0–35)
AST: 22 U/L (ref 0–37)
Albumin: 4 g/dL (ref 3.5–5.2)
Alkaline Phosphatase: 54 U/L (ref 39–117)
BUN: 10 mg/dL (ref 6–23)
CO2: 37 mEq/L — ABNORMAL HIGH (ref 19–32)
Calcium: 9.6 mg/dL (ref 8.4–10.5)
Chloride: 98 mEq/L (ref 96–112)
Creatinine, Ser: 0.84 mg/dL (ref 0.40–1.20)
GFR: 68.05 mL/min (ref >60.00)
Glucose, Bld: 105 mg/dL — ABNORMAL HIGH (ref 70–99)
Potassium: 4.2 mEq/L (ref 3.5–5.1)
Sodium: 141 mEq/L (ref 135–145)
Total Bilirubin: 0.5 mg/dL (ref 0.2–1.2)
Total Protein: 7.1 g/dL (ref 6.0–8.3)

## 2020-02-25 LAB — PRO B NATRIURETIC PEPTIDE: NT-Pro BNP: 193 pg/mL (ref 0–738)

## 2020-02-25 MED ORDER — IOHEXOL 350 MG/ML SOLN
100.0000 mL | Freq: Once | INTRAVENOUS | Status: AC | PRN
  Administered 2020-02-25: 100 mL via INTRAVENOUS

## 2020-03-02 DIAGNOSIS — Z0000 Encounter for general adult medical examination without abnormal findings: Secondary | ICD-10-CM | POA: Diagnosis not present

## 2020-03-02 DIAGNOSIS — E039 Hypothyroidism, unspecified: Secondary | ICD-10-CM | POA: Diagnosis not present

## 2020-03-02 DIAGNOSIS — N183 Chronic kidney disease, stage 3 unspecified: Secondary | ICD-10-CM | POA: Diagnosis not present

## 2020-03-02 DIAGNOSIS — J449 Chronic obstructive pulmonary disease, unspecified: Secondary | ICD-10-CM | POA: Diagnosis not present

## 2020-03-02 DIAGNOSIS — F320 Major depressive disorder, single episode, mild: Secondary | ICD-10-CM | POA: Diagnosis not present

## 2020-03-02 DIAGNOSIS — Z9981 Dependence on supplemental oxygen: Secondary | ICD-10-CM | POA: Diagnosis not present

## 2020-03-02 DIAGNOSIS — I10 Essential (primary) hypertension: Secondary | ICD-10-CM | POA: Diagnosis not present

## 2020-03-02 DIAGNOSIS — E1121 Type 2 diabetes mellitus with diabetic nephropathy: Secondary | ICD-10-CM | POA: Diagnosis not present

## 2020-03-02 DIAGNOSIS — E785 Hyperlipidemia, unspecified: Secondary | ICD-10-CM | POA: Diagnosis not present

## 2020-03-11 ENCOUNTER — Ambulatory Visit: Payer: HMO | Admitting: Emergency Medicine

## 2020-03-21 DIAGNOSIS — J449 Chronic obstructive pulmonary disease, unspecified: Secondary | ICD-10-CM | POA: Diagnosis not present

## 2020-03-23 DIAGNOSIS — Z961 Presence of intraocular lens: Secondary | ICD-10-CM | POA: Diagnosis not present

## 2020-03-23 DIAGNOSIS — H35373 Puckering of macula, bilateral: Secondary | ICD-10-CM | POA: Diagnosis not present

## 2020-03-23 DIAGNOSIS — E113293 Type 2 diabetes mellitus with mild nonproliferative diabetic retinopathy without macular edema, bilateral: Secondary | ICD-10-CM | POA: Diagnosis not present

## 2020-03-25 DIAGNOSIS — S4990XA Unspecified injury of shoulder and upper arm, unspecified arm, initial encounter: Secondary | ICD-10-CM | POA: Diagnosis not present

## 2020-04-03 ENCOUNTER — Other Ambulatory Visit: Payer: Self-pay | Admitting: Emergency Medicine

## 2020-04-06 DIAGNOSIS — G250 Essential tremor: Secondary | ICD-10-CM | POA: Diagnosis not present

## 2020-04-13 ENCOUNTER — Encounter: Payer: Self-pay | Admitting: Emergency Medicine

## 2020-04-13 ENCOUNTER — Other Ambulatory Visit: Payer: Self-pay

## 2020-04-13 ENCOUNTER — Ambulatory Visit: Payer: HMO | Admitting: Emergency Medicine

## 2020-04-13 DIAGNOSIS — J301 Allergic rhinitis due to pollen: Secondary | ICD-10-CM | POA: Diagnosis not present

## 2020-04-13 DIAGNOSIS — J42 Unspecified chronic bronchitis: Secondary | ICD-10-CM

## 2020-04-13 DIAGNOSIS — J9611 Chronic respiratory failure with hypoxia: Secondary | ICD-10-CM

## 2020-04-13 MED ORDER — PREDNISONE 10 MG PO TABS: ORAL_TABLET | ORAL | 0 refills | Status: DC

## 2020-04-13 NOTE — Addendum Note (Signed)
Addended by: Dorisann Frames R on: 04/13/2020 02:31 PM   Modules accepted: Orders

## 2020-04-13 NOTE — Assessment & Plan Note (Signed)
She is not on Singulair anymore.  She is on Allegra.  I will ask her to start taking her fluticasone nasal spray every day during the allergy season.

## 2020-04-13 NOTE — Assessment & Plan Note (Signed)
With acute flaring symptoms including increased cough, albuterol use, wheezing.  I will treat her with prednisone taper.  Continue her [employer]

## 2020-04-13 NOTE — Patient Instructions (Signed)
Please take prednisone as directed until completely gone Continue Breztri 2 puffs twice a day.  Rinse and gargle after using.  You may want to try taking this through a spacer. Use your albuterol either 2 puffs or 1 nebulizer treatment up to every 4 hours if needed for shortness of breath, chest tightness, wheezing. Continue Allegra once daily Start taking your fluticasone nasal spray, 2 sprays each nostril once daily at least through the allergy season Continue your oxygen at 2 to 3 L/min.  Our goal is to keep your oxygen saturations greater than 90% as much as possible Follow with Dr Delton Coombes in 3 months or sooner if you have any problems.

## 2020-04-13 NOTE — Assessment & Plan Note (Signed)
Continue oxygen 2-3 L/min

## 2020-04-13 NOTE — Progress Notes (Signed)
  Subjective:   Patient ID: [name] [name], female    DOB: 1945/04/19, 75 y.o.   MRN: [id number]  HPI [name] [name] is a 75 year old with moderate COPD. Upper airway irritation syndrome and chronic cough. Allergic rhinitis.      ROV 04/16/19 --75 year old obese woman with moderate COPD and upper airway instability with chronic cough in the setting of GERD, allergic rhinitis.  She has chronic hypoxemic respiratory failure on 2 to 3 L/min, uses a POC. She had a COVID exposure back in 12/2018, but she tested negative.  She has been noticing some exertional SOB, increasing cough - usually non-productive. She is on [employer], singulair, flonase, Careers adviser. She is on omeprazole but has breakthrough GERD. She remains on Stiolto.   ROV 04/13/20 --75 year old obese woman with moderate COPD, chronic cough in the setting of this and also upper airway instability due to GERD, allergic rhinitis.  She has chronic hypoxemic respiratory failure and is on 2-3 L/min.  She had COVID-19 in December 2021.  She was last seen in our office in February at which time she was treated for an acute exacerbation of COPD with azithromycin and prednisone taper.  Today she reports that she is having more exertional SOB, has been using her O2. She is having more congestion w the allergies. She is on allegra, using flonase prn. She is doing nasal rinses. She is not on singulair.  Currently managed on Breztri.  She is using albuterol 2-3x a day.   Objective:   Vitals:   04/13/20 1355  BP: 118/74  Pulse: (!) 106  Temp: (!) 97.3 F (36.3 C)  TempSrc: Temporal  SpO2: 94%  Weight: 141 lb 6.4 oz (64.1 kg)  Height: 5' (1.524 m)    Gen: Pleasant, obese, in no distress,  normal affect  ENT: No lesions,  mouth clear,  oropharynx clear, no postnasal drip, strong voice today  Neck: No JVD, intermittent low pitched UA noise  Lungs: No use of accessory muscles, distant, no wheeze today  Cardiovascular: RRR, heart sounds normal, no murmur  or gallops, no peripheral edema  Musculoskeletal: No deformities, no cyanosis or clubbing  Neuro: alert, non focal  Skin: Warm, no lesions or rashes   Assessment & Plan:  COPD (chronic obstructive pulmonary disease) (HCC) With acute flaring symptoms including increased cough, albuterol use, wheezing.  I will treat her with prednisone taper.  Continue her Breztri  Chronic respiratory failure with hypoxia (HCC) Continue oxygen 2-3 L/min  Allergic rhinitis She is not on Singulair anymore.  She is on Allegra.  I will ask her to start taking her fluticasone nasal spray every day during the allergy season.    [name], MD, PhD 04/13/2020, 2:12 PM [facility] [phone] or if no answer [phone]

## 2020-04-21 DIAGNOSIS — J449 Chronic obstructive pulmonary disease, unspecified: Secondary | ICD-10-CM | POA: Diagnosis not present

## 2020-04-22 DIAGNOSIS — M7501 Adhesive capsulitis of right shoulder: Secondary | ICD-10-CM | POA: Diagnosis not present

## 2020-05-01 ENCOUNTER — Other Ambulatory Visit: Payer: Self-pay

## 2020-05-01 ENCOUNTER — Emergency Department (HOSPITAL_BASED_OUTPATIENT_CLINIC_OR_DEPARTMENT_OTHER)
Admission: EM | Admit: 2020-05-01 | Discharge: 2020-05-01 | Disposition: A | Discharge status: Home / Self Care | Payer: HMO | Source: Home / Self Care | Attending: Emergency Medicine | Admitting: Emergency Medicine

## 2020-05-01 ENCOUNTER — Emergency Department (HOSPITAL_BASED_OUTPATIENT_CLINIC_OR_DEPARTMENT_OTHER): Payer: HMO

## 2020-05-01 ENCOUNTER — Encounter (HOSPITAL_BASED_OUTPATIENT_CLINIC_OR_DEPARTMENT_OTHER): Payer: Self-pay | Admitting: *Deleted

## 2020-05-01 DIAGNOSIS — E785 Hyperlipidemia, unspecified: Secondary | ICD-10-CM | POA: Diagnosis present

## 2020-05-01 DIAGNOSIS — J449 Chronic obstructive pulmonary disease, unspecified: Secondary | ICD-10-CM | POA: Insufficient documentation

## 2020-05-01 DIAGNOSIS — Z9049 Acquired absence of other specified parts of digestive tract: Secondary | ICD-10-CM | POA: Diagnosis not present

## 2020-05-01 DIAGNOSIS — Z882 Allergy status to sulfonamides status: Secondary | ICD-10-CM | POA: Diagnosis not present

## 2020-05-01 DIAGNOSIS — R000 Tachycardia, unspecified: Secondary | ICD-10-CM | POA: Insufficient documentation

## 2020-05-01 DIAGNOSIS — J9621 Acute and chronic respiratory failure with hypoxia: Secondary | ICD-10-CM | POA: Diagnosis present

## 2020-05-01 DIAGNOSIS — Z7951 Long term (current) use of inhaled steroids: Secondary | ICD-10-CM | POA: Diagnosis not present

## 2020-05-01 DIAGNOSIS — J181 Lobar pneumonia, unspecified organism: Secondary | ICD-10-CM | POA: Insufficient documentation

## 2020-05-01 DIAGNOSIS — R058 Other specified cough: Secondary | ICD-10-CM

## 2020-05-01 DIAGNOSIS — Z79899 Other long term (current) drug therapy: Secondary | ICD-10-CM | POA: Diagnosis not present

## 2020-05-01 DIAGNOSIS — Z9981 Dependence on supplemental oxygen: Secondary | ICD-10-CM | POA: Diagnosis not present

## 2020-05-01 DIAGNOSIS — Z20822 Contact with and (suspected) exposure to covid-19: Secondary | ICD-10-CM | POA: Insufficient documentation

## 2020-05-01 DIAGNOSIS — J189 Pneumonia, unspecified organism: Secondary | ICD-10-CM

## 2020-05-01 DIAGNOSIS — J440 Chronic obstructive pulmonary disease with acute lower respiratory infection: Secondary | ICD-10-CM | POA: Diagnosis present

## 2020-05-01 DIAGNOSIS — Z8616 Personal history of COVID-19: Secondary | ICD-10-CM | POA: Insufficient documentation

## 2020-05-01 DIAGNOSIS — Z87891 Personal history of nicotine dependence: Secondary | ICD-10-CM | POA: Insufficient documentation

## 2020-05-01 DIAGNOSIS — Z8349 Family history of other endocrine, nutritional and metabolic diseases: Secondary | ICD-10-CM | POA: Diagnosis not present

## 2020-05-01 DIAGNOSIS — R0602 Shortness of breath: Secondary | ICD-10-CM | POA: Diagnosis not present

## 2020-05-01 DIAGNOSIS — R059 Cough, unspecified: Secondary | ICD-10-CM | POA: Diagnosis not present

## 2020-05-01 DIAGNOSIS — Z7984 Long term (current) use of oral hypoglycemic drugs: Secondary | ICD-10-CM | POA: Diagnosis not present

## 2020-05-01 DIAGNOSIS — K219 Gastro-esophageal reflux disease without esophagitis: Secondary | ICD-10-CM | POA: Diagnosis present

## 2020-05-01 DIAGNOSIS — I2510 Atherosclerotic heart disease of native coronary artery without angina pectoris: Secondary | ICD-10-CM | POA: Diagnosis present

## 2020-05-01 DIAGNOSIS — I10 Essential (primary) hypertension: Secondary | ICD-10-CM | POA: Diagnosis present

## 2020-05-01 DIAGNOSIS — N179 Acute kidney failure, unspecified: Secondary | ICD-10-CM | POA: Diagnosis present

## 2020-05-01 DIAGNOSIS — Z86711 Personal history of pulmonary embolism: Secondary | ICD-10-CM | POA: Diagnosis not present

## 2020-05-01 DIAGNOSIS — E119 Type 2 diabetes mellitus without complications: Secondary | ICD-10-CM | POA: Diagnosis present

## 2020-05-01 DIAGNOSIS — J441 Chronic obstructive pulmonary disease with (acute) exacerbation: Secondary | ICD-10-CM | POA: Diagnosis present

## 2020-05-01 DIAGNOSIS — J42 Unspecified chronic bronchitis: Secondary | ICD-10-CM | POA: Diagnosis not present

## 2020-05-01 DIAGNOSIS — Z833 Family history of diabetes mellitus: Secondary | ICD-10-CM | POA: Diagnosis not present

## 2020-05-01 DIAGNOSIS — Z793 Long term (current) use of hormonal contraceptives: Secondary | ICD-10-CM | POA: Diagnosis not present

## 2020-05-01 DIAGNOSIS — Z8249 Family history of ischemic heart disease and other diseases of the circulatory system: Secondary | ICD-10-CM | POA: Diagnosis not present

## 2020-05-01 LAB — BASIC METABOLIC PANEL
Anion gap: 10 (ref 5–15)
BUN: 19 mg/dL (ref 8–23)
CO2: 32 mmol/L (ref 22–32)
Calcium: 10.2 mg/dL (ref 8.9–10.3)
Chloride: 96 mmol/L — ABNORMAL LOW (ref 98–111)
Creatinine, Ser: 0.85 mg/dL (ref 0.44–1.00)
GFR, Estimated: >60 mL/min (ref >60)
Glucose, Bld: 187 mg/dL — ABNORMAL HIGH (ref 70–99)
Potassium: 4.3 mmol/L (ref 3.5–5.1)
Sodium: 138 mmol/L (ref 135–145)

## 2020-05-01 LAB — RESP PANEL BY RT-PCR (FLU A&B, COVID) ARPGX2
Influenza A by PCR: NEGATIVE
Influenza B by PCR: NEGATIVE
SARS Coronavirus 2 by RT PCR: NEGATIVE

## 2020-05-01 LAB — CBC WITH DIFFERENTIAL/PLATELET
Abs Immature Granulocytes: 0.1 10*3/uL — ABNORMAL HIGH (ref 0.00–0.07)
Basophils Absolute: 0.1 10*3/uL (ref 0.0–0.1)
Basophils Relative: 1 %
Eosinophils Absolute: 0.5 10*3/uL (ref 0.0–0.5)
Eosinophils Relative: 3 %
HCT: 40.4 % (ref 36.0–46.0)
Hemoglobin: 13.3 g/dL (ref 12.0–15.0)
Immature Granulocytes: 1 %
Lymphocytes Relative: 8 %
Lymphs Abs: 1.4 10*3/uL (ref 0.7–4.0)
MCH: 31.1 pg (ref 26.0–34.0)
MCHC: 32.9 g/dL (ref 30.0–36.0)
MCV: 94.4 fL (ref 80.0–100.0)
Monocytes Absolute: 1.4 10*3/uL — ABNORMAL HIGH (ref 0.1–1.0)
Monocytes Relative: 8 %
Neutro Abs: 14.2 10*3/uL — ABNORMAL HIGH (ref 1.7–7.7)
Neutrophils Relative %: 79 %
Platelets: 334 10*3/uL (ref 150–400)
RBC: 4.28 MIL/uL (ref 3.87–5.11)
RDW: 12.3 % (ref 11.5–15.5)
WBC: 17.7 10*3/uL — ABNORMAL HIGH (ref 4.0–10.5)
nRBC: 0 % (ref 0.0–0.2)

## 2020-05-01 LAB — HEPATIC FUNCTION PANEL
ALT: 11 U/L (ref 0–44)
AST: 11 U/L — ABNORMAL LOW (ref 15–41)
Albumin: 3.9 g/dL (ref 3.5–5.0)
Alkaline Phosphatase: 71 U/L (ref 38–126)
Bilirubin, Direct: 0.2 mg/dL (ref 0.0–0.2)
Indirect Bilirubin: 0.4 mg/dL (ref 0.3–0.9)
Total Bilirubin: 0.6 mg/dL (ref 0.3–1.2)
Total Protein: 7.4 g/dL (ref 6.5–8.1)

## 2020-05-01 LAB — TROPONIN I (HIGH SENSITIVITY): Troponin I (High Sensitivity): 4 ng/L (ref <18)

## 2020-05-01 LAB — BRAIN NATRIURETIC PEPTIDE: B Natriuretic Peptide: 141.3 pg/mL — ABNORMAL HIGH (ref 0.0–100.0)

## 2020-05-01 MED ORDER — CEFPODOXIME PROXETIL 200 MG PO TABS
200.0000 mg | ORAL_TABLET | Freq: Two times a day (BID) | ORAL | 0 refills | Status: DC
Start: 2020-05-01 — End: 2020-05-06

## 2020-05-01 MED ORDER — METHYLPREDNISOLONE SODIUM SUCC 125 MG IJ SOLR
125.0000 mg | Freq: Once | INTRAMUSCULAR | Status: AC
  Administered 2020-05-01: 125 mg via INTRAVENOUS
  Filled 2020-05-01: qty 2

## 2020-05-01 MED ORDER — IPRATROPIUM BROMIDE HFA 17 MCG/ACT IN AERS
2.0000 | INHALATION_SPRAY | Freq: Once | RESPIRATORY_TRACT | Status: AC
  Administered 2020-05-01: 2 via RESPIRATORY_TRACT
  Filled 2020-05-01: qty 12.9

## 2020-05-01 MED ORDER — DOXYCYCLINE HYCLATE 100 MG PO CAPS: 100.0000 mg | ORAL_CAPSULE | Freq: Two times a day (BID) | ORAL | 0 refills | Status: DC

## 2020-05-01 MED ORDER — PREDNISONE 20 MG PO TABS: 40.0000 mg | ORAL_TABLET | Freq: Every day | ORAL | 0 refills | Status: DC

## 2020-05-01 MED ORDER — ALBUTEROL SULFATE HFA 108 (90 BASE) MCG/ACT IN AERS
8.0000 | INHALATION_SPRAY | Freq: Once | RESPIRATORY_TRACT | Status: AC
  Administered 2020-05-01: 8 via RESPIRATORY_TRACT
  Filled 2020-05-01: qty 6.7

## 2020-05-01 NOTE — ED Triage Notes (Signed)
3 days of increased SHOB. Started antibiotics on Wed after virtual appointment. Used inhaler x 1 today. Wheezing bil.

## 2020-05-01 NOTE — ED Notes (Signed)
Patient arrives with [facility]. Patient is on chronic O2 for COPD. BBS dim w/ insp and exp wheezing noted. Patient has been having Shob for 3 days and states compliance with her respiratory medications. Patient seen by [facility] pulmonary outpatient. Patient with hx of blood clots. RT assessment completed.

## 2020-05-01 NOTE — ED Provider Notes (Signed)
[facility] EMERGENCY DEPT Provider Note   CSN: [id number] Arrival date & time: 05/01/20  1430     History Chief Complaint  Patient presents with  . Shortness of Breath    [name] [name] is a 75 y.o. female.  The history is provided by the patient and medical records. No language interpreter was used.  Cough Cough characteristics:  Productive Sputum characteristics:  Nondescript Severity:  Moderate Onset quality:  Gradual Duration:  1 week Timing:  Constant Progression:  Waxing and waning Chronicity:  Recurrent Relieved by:  Nothing Worsened by:  Nothing Ineffective treatments:  None tried Associated symptoms: chills, shortness of breath, sinus congestion and wheezing   Associated symptoms: no chest pain, no fever, no headaches, no rash, no rhinorrhea and no weight loss        Past Medical History:  Diagnosis Date  . Allergic rhinitis, cause unspecified   . CAD in native artery 07/25/2017  . Chronic airway obstruction, not elsewhere classified   . Depressive disorder, not elsewhere classified   . Nephritis and nephropathy, not specified as acute or chronic, with unspecified pathological lesion in kidney   . Obesity, unspecified   . Other and unspecified hyperlipidemia   . Oxygen deficiency   . Thyrotoxicosis without mention of goiter or other cause, without mention of thyrotoxic crisis or storm   . Unspecified essential hypertension   . UTI (urinary tract infection) 2019 dec    Patient Active Problem List   Diagnosis Date Noted  . Shortness of breath 02/24/2020  . History of pulmonary embolus (PE) 02/24/2020  . Abnormal findings on diagnostic imaging of lung 12/11/2019  . Healthcare maintenance 11/18/2019  . Acute non-recurrent ethmoidal sinusitis 11/18/2019  . Close exposure to COVID-19 virus 01/01/2019  . Chronic respiratory failure with hypoxia (HCC) 07/30/2018  . Iron deficiency anemia 01/14/2018  . Pulmonary embolism (HCC) 07/26/2017   . CAD in native artery 07/25/2017  . Abnormal cardiac CT angiography 02/21/2017  . Chest pain with high risk of acute coronary syndrome 02/21/2017  . Non-insulin treated type 2 diabetes mellitus (HCC) 02/21/2017  . GERD (gastroesophageal reflux disease) 12/05/2016  . Vocal cord dysfunction 11/06/2014  . Cough 06/04/2013  . Thyrotoxicosis 10/31/2006  . Dyslipidemia 10/31/2006  . OBESITY 10/31/2006  . DEPRESSION 10/31/2006  . Essential hypertension 10/31/2006  . Allergic rhinitis 10/31/2006  . COPD (chronic obstructive pulmonary disease) (HCC) 10/31/2006  . GLOMERULONEPHRITIS 10/31/2006    Past Surgical History:  Procedure Laterality Date  . CHOLECYSTECTOMY    . LEFT HEART CATH AND CORONARY ANGIOGRAPHY N/A 03/01/2017   Procedure: LEFT HEART CATH AND CORONARY ANGIOGRAPHY;  Surgeon: Yvonne Kendall, MD;  Location: [facility] INVASIVE CV LAB;  Service: Cardiovascular;  Laterality: N/A;     OB History   No obstetric history on file.     Family History  Problem Relation Age of Onset  . Heart failure Mother   . Hyperlipidemia Mother   . Hypertension Mother   . Diabetes Mother   . CAD Mother   . Cancer Brother   . Stroke Maternal Grandfather   . Diabetes Brother     Social History   Tobacco Use  . Smoking status: Former Smoker    Packs/day: 2.00    Years: 30.00    Pack years: 60.00    Quit date: 01/09/1994    Years since quitting: 26.3  . Smokeless tobacco: Never Used  Vaping Use  . Vaping Use: Never used  Substance Use Topics  . Alcohol use:  No  . Drug use: Never    Home Medications Prior to Admission medications   Medication Sig Start Date End Date Taking? Authorizing Provider  albuterol (PROVENTIL) (2.5 MG/3ML) 0.083% nebulizer solution Take 3 mLs (2.5 mg total) by nebulization every 4 (four) hours as needed for wheezing or shortness of breath (DX: COPD 496). 06/04/13   Leslye Peer, MD  amoxicillin-clavulanate (AUGMENTIN) 875-125 MG tablet Take 1 tablet by mouth 2  (two) times daily. 11/18/19   [name], NP  Ascorbic Acid (VITAMIN C) 1000 MG tablet Take 1,000 mg by mouth daily.    [provider]  atenolol (TENORMIN) 50 MG tablet Take 50 mg by mouth daily.    [provider]  atorvastatin (LIPITOR) 20 MG tablet Take 20 mg by mouth daily.    [provider]  azithromycin (ZITHROMAX) 250 MG tablet 500mg  (two tablets) today, then 250mg  (1 tablet) for the next 4 days 02/24/20   , NP  Budeson-Glycopyrrol-Formoterol (BREZTRI AEROSPHERE) 160-9-4.8 MCG/ACT AERO Inhale 2 puffs into the lungs 2 (two) times daily. 02/24/20   [name], NP  Budeson-Glycopyrrol-Formoterol (BREZTRI AEROSPHERE) 160-9-4.8 MCG/ACT AERO Inhale 2 puffs into the lungs in the morning and at bedtime. 02/24/20   [name], NP  esomeprazole (NEXIUM) 40 MG capsule TAKE 1 CAPSULE (40 MG TOTAL) BY MOUTH 2 (TWO) TIMES DAILY BEFORE A MEAL. 04/05/20   [name], MD  ferrous sulfate 325 (65 FE) MG tablet Take 325 mg by mouth daily. 10/19/18   [provider]  fexofenadine (ALLEGRA) 180 MG tablet Take 180 mg by mouth daily.    [provider]  fluticasone (FLONASE) 50 MCG/ACT nasal spray SPRAY 2 SPRAYS INTO EACH NOSTRIL EVERY DAY 01/12/20   12/19/18, MD  levothyroxine (SYNTHROID) 88 MCG tablet Take 88 mcg by mouth daily. 10/09/18   [provider]  losartan (COZAAR) 25 MG tablet Take 25 mg by mouth daily.    [provider]  metFORMIN (GLUCOPHAGE-XR) 500 MG 24 hr tablet Take 500 mg by mouth daily. 12/20/18   [provider]  Multiple Vitamins-Minerals (CENTRUM PO) Take 1 tablet by mouth daily.    [provider]  nitroGLYCERIN (NITROSTAT) 0.4 MG SL tablet Place 1 tablet (0.4 mg total) under the tongue every 5 (five) minutes as needed for chest pain. 02/21/17 01/01/18  02/23/17, PA-C  omeprazole (PRILOSEC) 20 MG capsule TAKE 1 CAPSULE (20 MG TOTAL) BY MOUTH 2 (TWO) TIMES DAILY BEFORE A MEAL.  10/07/18   Abelino Derrick, MD  ONE TOUCH ULTRA TEST test strip As directed 05/18/11   [provider]  predniSONE (DELTASONE) 10 MG tablet Take 4 tablets X 3 days, 3 tabs X 3 days, 2 tabs x 3 days, 1 tab x 3 days 04/13/20   07/18/11, MD  PROVENTIL HFA 108 [phone] Base) MCG/ACT inhaler INHALE 2 PUFFS BY MOUTH INTO LUNGS EVERY SIX HOURS AS NEEDED FOR WHEEZING OR SHORTNESS OF BREATH 12/11/19   (79, MD  Tiotropium Bromide-Olodaterol (STIOLTO RESPIMAT) 2.5-2.5 MCG/ACT AERS Inhale 2 puffs into the lungs daily. 02/21/18   Leslye Peer, MD  traZODone (DESYREL) 50 MG tablet Take 50 mg by mouth at bedtime.  02/28/13   [provider]  venlafaxine XR (EFFEXOR-XR) 150 MG 24 hr capsule Take 150 mg by mouth daily.  07/03/17   [provider]    Allergies    Lisinopril and Sulfonamide derivatives  Review  of Systems   Review of Systems  Constitutional: Positive for chills and fatigue. Negative for fever and weight loss.  HENT: Negative for congestion and rhinorrhea.   Eyes: Negative for visual disturbance.  Respiratory: Positive for cough, chest tightness, shortness of breath and wheezing.   Cardiovascular: Negative for chest pain, palpitations and leg swelling.  Gastrointestinal: Negative for constipation, diarrhea, nausea and vomiting.  Genitourinary: Negative for dysuria.  Musculoskeletal: Negative for back pain and neck pain.  Skin: Negative for rash.  Neurological: Negative for dizziness, light-headedness and headaches.  Psychiatric/Behavioral: Negative for agitation and confusion.  All other systems reviewed and are negative.   Physical Exam Updated Vital Signs BP (!) 161/95   Pulse (!) 123   Temp 98.4 F (36.9 C) (Oral)   Resp (!) 26   Ht 5' (1.524 m)   Wt 63.5 kg   BMI 27.34 kg/m   Physical Exam Vitals and nursing note reviewed.  Constitutional:      General: She is not in acute distress.    Appearance: She is well-developed. She is not  ill-appearing, toxic-appearing or diaphoretic.  HENT:     Head: Normocephalic and atraumatic.     Mouth/Throat:     Mouth: Mucous membranes are moist.  Eyes:     Conjunctiva/sclera: Conjunctivae normal.     Pupils: Pupils are equal, round, and reactive to light.  Cardiovascular:     Rate and Rhythm: Regular rhythm. Tachycardia present.     Heart sounds: No murmur heard.   Pulmonary:     Effort: Pulmonary effort is normal. No respiratory distress.     Breath sounds: Wheezing and rhonchi present.  Chest:     Chest wall: No tenderness.  Abdominal:     Palpations: Abdomen is soft.     Tenderness: There is no abdominal tenderness.  Musculoskeletal:     Cervical back: Neck supple.     Right lower leg: No tenderness. No edema.     Left lower leg: No tenderness. No edema.  Skin:    General: Skin is warm and dry.     Capillary Refill: Capillary refill takes less than 2 seconds.     Findings: No erythema.  Neurological:     General: No focal deficit present.     Mental Status: She is alert.  Psychiatric:        Mood and Affect: Mood normal.     ED Results / Procedures / Treatments   Labs (all labs ordered are listed, but only abnormal results are displayed) Labs Reviewed  CBC WITH DIFFERENTIAL/PLATELET - Abnormal; Notable for the following components:      Result Value   WBC 17.7 (*)    Neutro Abs 14.2 (*)    Monocytes Absolute 1.4 (*)    Abs Immature Granulocytes 0.10 (*)    All other components within normal limits  BASIC METABOLIC PANEL - Abnormal; Notable for the following components:   Chloride 96 (*)    Glucose, Bld 187 (*)    All other components within normal limits  BRAIN NATRIURETIC PEPTIDE - Abnormal; Notable for the following components:   B Natriuretic Peptide 141.3 (*)    All other components within normal limits  HEPATIC FUNCTION PANEL - Abnormal; Notable for the following components:   AST 11 (*)    All other components within normal limits  RESP PANEL  BY RT-PCR (FLU A&B, COVID) [id number]  TROPONIN I (HIGH SENSITIVITY)    EKG EKG Interpretation  Date/Time:  Saturday May 01 2020 14:40:41 EDT Ventricular Rate:  121 PR Interval:  150 QRS Duration: 80 QT Interval:  302 QTC Calculation: 429 R Axis:   65 Text Interpretation: Sinus tachycardia Atrial premature complex Artifact in lead(s) I II III aVR aVL aVF V4 Confirmed by Virgina Norfolkuratolo, Adam ([id number]) on 05/01/2020 2:44:06 PM   Radiology DG Chest Portable 1 View  Result Date: 05/01/2020 CLINICAL DATA:  Shortness of breath. EXAM: PORTABLE CHEST 1 VIEW COMPARISON:  February 24, 2020 FINDINGS: Bibasilar opacities are identified. The heart, hila, mediastinum, lungs, and pleura are otherwise unremarkable. IMPRESSION: Bibasilar infiltrates worrisome for pneumonia or aspiration. Recommend short-term follow-up imaging to ensure resolution. Electronically Signed   By: Gerome Samavid  Williams III M.D   On: 05/01/2020 15:33    Procedures Procedures   Medications Ordered in ED Medications  albuterol (VENTOLIN HFA) 108 (90 Base) MCG/ACT inhaler 8 puff (8 puffs Inhalation Given 05/01/20 1458)  ipratropium (ATROVENT HFA) inhaler 2 puff (2 puffs Inhalation Given 05/01/20 1458)  methylPREDNISolone sodium succinate (SOLU-MEDROL) 125 mg/2 mL injection 125 mg (125 mg Intravenous Given 05/01/20 1545)    ED Course  I have reviewed the triage vital signs and the nursing notes.  Pertinent labs & imaging results that were available during my care of the patient were reviewed by me and considered in my medical decision making (see chart for details).    MDM Rules/Calculators/A&P                          Gerre Pebblesatricia Ann Delong is a 75 y.o. female with a past medical history significant for hypertension, GERD, diabetes, prior pulmonary embolism, and COPD on 3 L oxygen at baseline who presents with shortness of breath and cough.  Patient reports that for the last week or so she has had worsened breathing.  She attributes to the  significant pollen and seasonal burden as well as temperature changes however she did a virtual visit several days ago and was started on antibiotics for possible pneumonia or bronchitis.  She reports has had a productive cough with some phlegm but no hemoptysis.  She reports today it was worsened and she decided come get evaluated.  She is tachycardic and tachypneic on arrival but is afebrile.  She has had some chills but has not had any documented fevers.  She denies nausea, vomiting, constipation, diarrhea, dysuria.  Denies any leg pain or leg swelling.  Denies any chest pain whatsoever.  She says that a month or 2 ago she had a work-up including a CT PE study with no evidence of blood clot and she does not think that is what is going on.  On exam, patient had wheezing in all lung fields as well as some rhonchi.  No significant rales.  Chest and abdomen nontender.  Patient now resting more comfortably and breathing more comfortably after she was given several puffs of albuterol and ipratropium by respiratory therapy on arrival.  EKG shows no STEMI.  Had a shared decision conversation with patient about work-up.  Patient does not want to pursue PE work-up as she does not feel that is what is going on and she recently had a negative work-up for this.  She suspect is more of a COPD exacerbation in the setting of seasonal changes and possible infection.  We will check her for COVID and get screening labs.  She will get a chest x-ray.  She will be given steroids.  Anticipate shared decision-making conversation based on reassessment  work-up results.  If she is feeling better, dissipate discharge home with a burst of steroids which is what patient feels she will likely need.  Patient reports that she does not want to be admitted despite now confirming that it was Augmentin she has failed over the last several days.  She wants to use a different antibiotic combination and go home.  She does have a leukocytosis  that is present and her x-ray does show bibasilar pneumonia.  COVID and flu test negative.  Other labs similar to prior but there is an elevation in her BNP.  We recommend admission but she wants to go home.  I spoke to pharmacy who recommended Vantin and doxycycline as a dual combination antibiotic for the next 10 days.  She will also a burst of steroids for the next 4 days starting tomorrow.  Patient continues to feel well and want to go home.  She will return if any symptoms change or worsen for likely admission.  She understands this plan of care and was discharged in good condition.    Final Clinical Impression(s) / ED Diagnoses Final diagnoses:  Productive cough  Shortness of breath  Pneumonia of both lower lobes due to infectious organism    Rx / DC Orders ED Discharge Orders         Ordered    predniSONE (DELTASONE) 20 MG tablet  Daily        05/01/20 1804    doxycycline (VIBRAMYCIN) 100 MG capsule  2 times daily        05/01/20 1804    cefpodoxime (VANTIN) 200 MG tablet  2 times daily        05/01/20 1804          Clinical Impression: 1. Pneumonia of both lower lobes due to infectious organism   2. Productive cough   3. Shortness of breath     Disposition: Discharge  Condition: Good  I have discussed the results, Dx and Tx plan with the pt(& family if present). He/she/they expressed understanding and agree(s) with the plan. Discharge instructions discussed at great length. Strict return precautions discussed and pt &/or family have verbalized understanding of the instructions. No further questions at time of discharge.    New Prescriptions   CEFPODOXIME (VANTIN) 200 MG TABLET    Take 1 tablet (200 mg total) by mouth 2 (two) times daily for 10 days.   DOXYCYCLINE (VIBRAMYCIN) 100 MG CAPSULE    Take 1 capsule (100 mg total) by mouth 2 (two) times daily.   PREDNISONE (DELTASONE) 20 MG TABLET    Take 2 tablets (40 mg total) by mouth daily for 4 days.    Follow  Up: Shirlean Mylar, MD [address] [place] [address] [phone]     [facility] [address] [place] [address] [phone]       Iszabella Hebenstreit, Canary Brim, MD 05/01/20 [phone]

## 2020-05-01 NOTE — Discharge Instructions (Signed)
Your history, exam, and work-up today are consistent with bilateral pneumonia causing the cough and shortness of breath.  The x-ray shows pneumonia in both lower lobes of your lungs.  As you have failed the previous antibiotic, we initially discussed admission for IV antibiotics but you instead wanted to try a different oral antibiotic regimen and close follow-up with a PCP.  Given your otherwise well appearance, we feel it is reasonable to attempt this.  Please take both new antibiotics instead of the old antibiotic for the next 10 days.  Please also use the burst of steroids for the next 4 days starting tomorrow.  Please use your inhalers at home.  Please rest and stay hydrated and follow-up with your primary doctor in the next several days.  If any symptoms start to change or worsen, please return to the nearest emergency department immediately as you will likely need admission.

## 2020-05-04 ENCOUNTER — Ambulatory Visit: Payer: Medicare Other | Admitting: Cardiovascular Disease

## 2020-05-04 ENCOUNTER — Encounter (HOSPITAL_COMMUNITY): Payer: Self-pay

## 2020-05-04 ENCOUNTER — Inpatient Hospital Stay (HOSPITAL_COMMUNITY)
Admission: EM | Admit: 2020-05-04 | Discharge: 2020-05-06 | DRG: 193 | Disposition: A | Discharge status: Home / Self Care | Payer: HMO | Attending: Internal Medicine | Admitting: Internal Medicine

## 2020-05-04 ENCOUNTER — Other Ambulatory Visit: Payer: Self-pay

## 2020-05-04 ENCOUNTER — Emergency Department (HOSPITAL_COMMUNITY): Payer: HMO

## 2020-05-04 DIAGNOSIS — J189 Pneumonia, unspecified organism: Principal | ICD-10-CM | POA: Diagnosis present

## 2020-05-04 DIAGNOSIS — Z79899 Other long term (current) drug therapy: Secondary | ICD-10-CM | POA: Diagnosis not present

## 2020-05-04 DIAGNOSIS — J9621 Acute and chronic respiratory failure with hypoxia: Secondary | ICD-10-CM

## 2020-05-04 DIAGNOSIS — E119 Type 2 diabetes mellitus without complications: Secondary | ICD-10-CM | POA: Diagnosis present

## 2020-05-04 DIAGNOSIS — R000 Tachycardia, unspecified: Secondary | ICD-10-CM | POA: Diagnosis present

## 2020-05-04 DIAGNOSIS — Z793 Long term (current) use of hormonal contraceptives: Secondary | ICD-10-CM

## 2020-05-04 DIAGNOSIS — Z86711 Personal history of pulmonary embolism: Secondary | ICD-10-CM

## 2020-05-04 DIAGNOSIS — Z20822 Contact with and (suspected) exposure to covid-19: Secondary | ICD-10-CM | POA: Diagnosis present

## 2020-05-04 DIAGNOSIS — Z7951 Long term (current) use of inhaled steroids: Secondary | ICD-10-CM | POA: Diagnosis not present

## 2020-05-04 DIAGNOSIS — K219 Gastro-esophageal reflux disease without esophagitis: Secondary | ICD-10-CM | POA: Diagnosis present

## 2020-05-04 DIAGNOSIS — Z9049 Acquired absence of other specified parts of digestive tract: Secondary | ICD-10-CM

## 2020-05-04 DIAGNOSIS — Z882 Allergy status to sulfonamides status: Secondary | ICD-10-CM

## 2020-05-04 DIAGNOSIS — I10 Essential (primary) hypertension: Secondary | ICD-10-CM | POA: Diagnosis present

## 2020-05-04 DIAGNOSIS — Z888 Allergy status to other drugs, medicaments and biological substances status: Secondary | ICD-10-CM

## 2020-05-04 DIAGNOSIS — Z87891 Personal history of nicotine dependence: Secondary | ICD-10-CM | POA: Diagnosis not present

## 2020-05-04 DIAGNOSIS — Z8349 Family history of other endocrine, nutritional and metabolic diseases: Secondary | ICD-10-CM

## 2020-05-04 DIAGNOSIS — J441 Chronic obstructive pulmonary disease with (acute) exacerbation: Secondary | ICD-10-CM | POA: Diagnosis present

## 2020-05-04 DIAGNOSIS — Z7984 Long term (current) use of oral hypoglycemic drugs: Secondary | ICD-10-CM

## 2020-05-04 DIAGNOSIS — Z8249 Family history of ischemic heart disease and other diseases of the circulatory system: Secondary | ICD-10-CM

## 2020-05-04 DIAGNOSIS — J440 Chronic obstructive pulmonary disease with acute lower respiratory infection: Secondary | ICD-10-CM | POA: Diagnosis present

## 2020-05-04 DIAGNOSIS — E785 Hyperlipidemia, unspecified: Secondary | ICD-10-CM | POA: Diagnosis present

## 2020-05-04 DIAGNOSIS — Z91138 Patient's unintentional underdosing of medication regimen for other reason: Secondary | ICD-10-CM

## 2020-05-04 DIAGNOSIS — I2510 Atherosclerotic heart disease of native coronary artery without angina pectoris: Secondary | ICD-10-CM | POA: Diagnosis present

## 2020-05-04 DIAGNOSIS — Z9981 Dependence on supplemental oxygen: Secondary | ICD-10-CM | POA: Diagnosis not present

## 2020-05-04 DIAGNOSIS — T447X6A Underdosing of beta-adrenoreceptor antagonists, initial encounter: Secondary | ICD-10-CM | POA: Diagnosis present

## 2020-05-04 DIAGNOSIS — J42 Unspecified chronic bronchitis: Secondary | ICD-10-CM

## 2020-05-04 DIAGNOSIS — Z833 Family history of diabetes mellitus: Secondary | ICD-10-CM | POA: Diagnosis not present

## 2020-05-04 DIAGNOSIS — J449 Chronic obstructive pulmonary disease, unspecified: Secondary | ICD-10-CM | POA: Diagnosis present

## 2020-05-04 DIAGNOSIS — N179 Acute kidney failure, unspecified: Secondary | ICD-10-CM | POA: Diagnosis present

## 2020-05-04 LAB — COMPREHENSIVE METABOLIC PANEL
ALT: 19 U/L (ref 0–44)
AST: 19 U/L (ref 15–41)
Albumin: 3.8 g/dL (ref 3.5–5.0)
Alkaline Phosphatase: 61 U/L (ref 38–126)
Anion gap: 12 (ref 5–15)
BUN: 22 mg/dL (ref 8–23)
CO2: 31 mmol/L (ref 22–32)
Calcium: 9.9 mg/dL (ref 8.9–10.3)
Chloride: 97 mmol/L — ABNORMAL LOW (ref 98–111)
Creatinine, Ser: 1.04 mg/dL — ABNORMAL HIGH (ref 0.44–1.00)
GFR, Estimated: 56 mL/min — ABNORMAL LOW (ref >60)
Glucose, Bld: 151 mg/dL — ABNORMAL HIGH (ref 70–99)
Potassium: 3.6 mmol/L (ref 3.5–5.1)
Sodium: 140 mmol/L (ref 135–145)
Total Bilirubin: 0.8 mg/dL (ref 0.3–1.2)
Total Protein: 8.2 g/dL — ABNORMAL HIGH (ref 6.5–8.1)

## 2020-05-04 LAB — CBC WITH DIFFERENTIAL/PLATELET
Abs Immature Granulocytes: 0.05 10*3/uL (ref 0.00–0.07)
Basophils Absolute: 0 10*3/uL (ref 0.0–0.1)
Basophils Relative: 0 %
Eosinophils Absolute: 0.1 10*3/uL (ref 0.0–0.5)
Eosinophils Relative: 1 %
HCT: 44.3 % (ref 36.0–46.0)
Hemoglobin: 14.3 g/dL (ref 12.0–15.0)
Immature Granulocytes: 1 %
Lymphocytes Relative: 16 %
Lymphs Abs: 1.4 10*3/uL (ref 0.7–4.0)
MCH: 30.8 pg (ref 26.0–34.0)
MCHC: 32.3 g/dL (ref 30.0–36.0)
MCV: 95.3 fL (ref 80.0–100.0)
Monocytes Absolute: 0.7 10*3/uL (ref 0.1–1.0)
Monocytes Relative: 8 %
Neutro Abs: 6.6 10*3/uL (ref 1.7–7.7)
Neutrophils Relative %: 74 %
Platelets: 380 10*3/uL (ref 150–400)
RBC: 4.65 MIL/uL (ref 3.87–5.11)
RDW: 12.1 % (ref 11.5–15.5)
WBC: 8.9 10*3/uL (ref 4.0–10.5)
nRBC: 0 % (ref 0.0–0.2)

## 2020-05-04 LAB — PROTIME-INR
INR: 1 (ref 0.8–1.2)
Prothrombin Time: 13.3 seconds (ref 11.4–15.2)

## 2020-05-04 LAB — RESP PANEL BY RT-PCR (FLU A&B, COVID) ARPGX2
Influenza A by PCR: NEGATIVE
Influenza B by PCR: NEGATIVE
SARS Coronavirus 2 by RT PCR: NEGATIVE

## 2020-05-04 LAB — LACTIC ACID, PLASMA
Lactic Acid, Venous: 1.5 mmol/L (ref 0.5–1.9)
Lactic Acid, Venous: 0.9 mmol/L (ref 0.5–1.9)

## 2020-05-04 LAB — GLUCOSE, CAPILLARY: Glucose-Capillary: 194 mg/dL — ABNORMAL HIGH (ref 70–99)

## 2020-05-04 LAB — TROPONIN I (HIGH SENSITIVITY)
Troponin I (High Sensitivity): 5 ng/L (ref <18)
Troponin I (High Sensitivity): 4 ng/L (ref <18)

## 2020-05-04 LAB — BRAIN NATRIURETIC PEPTIDE: B Natriuretic Peptide: 39.8 pg/mL (ref 0.0–100.0)

## 2020-05-04 MED ORDER — METOPROLOL TARTRATE 25 MG PO TABS: 25.0000 mg | ORAL_TABLET | Freq: Two times a day (BID) | ORAL | Status: DC

## 2020-05-04 MED ORDER — SODIUM CHLORIDE 0.9 % IV SOLN: 1.0000 g | INTRAVENOUS | Status: DC

## 2020-05-04 MED ORDER — SODIUM CHLORIDE 0.9 % IV SOLN: 1.0000 g | Freq: Once | INTRAVENOUS | Status: DC

## 2020-05-04 MED ORDER — VENLAFAXINE HCL ER 150 MG PO CP24
150.0000 mg | ORAL_CAPSULE | Freq: Every day | ORAL | Status: DC
  Administered 2020-05-04 – 2020-05-06 (×3): 150 mg via ORAL
  Filled 2020-05-04 (×3): qty 1

## 2020-05-04 MED ORDER — TRAMADOL HCL 50 MG PO TABS: 50.0000 mg | ORAL_TABLET | Freq: Two times a day (BID) | ORAL | Status: DC | PRN

## 2020-05-04 MED ORDER — INSULIN DETEMIR 100 UNIT/ML ~~LOC~~ SOLN
5.0000 [IU] | Freq: Two times a day (BID) | SUBCUTANEOUS | Status: DC
  Administered 2020-05-04 – 2020-05-06 (×4): 5 [IU] via SUBCUTANEOUS
  Filled 2020-05-04 (×4): qty 0.05

## 2020-05-04 MED ORDER — ALBUTEROL SULFATE (2.5 MG/3ML) 0.083% IN NEBU
2.5000 mg | INHALATION_SOLUTION | RESPIRATORY_TRACT | Status: DC
  Administered 2020-05-04: 2.5 mg via RESPIRATORY_TRACT
  Filled 2020-05-04: qty 3

## 2020-05-04 MED ORDER — POLYETHYLENE GLYCOL 3350 17 G PO PACK: 17.0000 g | PACK | Freq: Every day | ORAL | Status: DC | PRN

## 2020-05-04 MED ORDER — INSULIN ASPART 100 UNIT/ML ~~LOC~~ SOLN
0.0000 [IU] | Freq: Three times a day (TID) | SUBCUTANEOUS | Status: DC
  Administered 2020-05-05: 2 [IU] via SUBCUTANEOUS
  Administered 2020-05-05 (×2): 1 [IU] via SUBCUTANEOUS

## 2020-05-04 MED ORDER — METHYLPREDNISOLONE SODIUM SUCC 40 MG IJ SOLR
40.0000 mg | Freq: Two times a day (BID) | INTRAMUSCULAR | Status: DC
  Administered 2020-05-04 – 2020-05-05 (×2): 40 mg via INTRAVENOUS
  Filled 2020-05-04 (×2): qty 1

## 2020-05-04 MED ORDER — INSULIN ASPART 100 UNIT/ML ~~LOC~~ SOLN: 0.0000 [IU] | Freq: Every day | SUBCUTANEOUS | Status: DC

## 2020-05-04 MED ORDER — SODIUM CHLORIDE 0.9 % IV SOLN
2.0000 g | INTRAVENOUS | Status: DC
  Administered 2020-05-04 – 2020-05-05 (×2): 2 g via INTRAVENOUS
  Filled 2020-05-04: qty 2
  Filled 2020-05-04: qty 20
  Filled 2020-05-04: qty 2

## 2020-05-04 MED ORDER — BUDESON-GLYCOPYRROL-FORMOTEROL 160-9-4.8 MCG/ACT IN AERO: 2.0000 | INHALATION_SPRAY | Freq: Every day | RESPIRATORY_TRACT | Status: DC

## 2020-05-04 MED ORDER — IPRATROPIUM-ALBUTEROL 0.5-2.5 (3) MG/3ML IN SOLN
3.0000 mL | Freq: Once | RESPIRATORY_TRACT | Status: AC
  Administered 2020-05-04: 3 mL via RESPIRATORY_TRACT
  Filled 2020-05-04: qty 3

## 2020-05-04 MED ORDER — PROPRANOLOL HCL 20 MG PO TABS
20.0000 mg | ORAL_TABLET | Freq: Once | ORAL | Status: AC
  Administered 2020-05-04: 20 mg via ORAL
  Filled 2020-05-04: qty 1

## 2020-05-04 MED ORDER — ENOXAPARIN SODIUM 40 MG/0.4ML ~~LOC~~ SOLN
40.0000 mg | SUBCUTANEOUS | Status: DC
  Administered 2020-05-04 – 2020-05-05 (×2): 40 mg via SUBCUTANEOUS
  Filled 2020-05-04 (×2): qty 0.4

## 2020-05-04 MED ORDER — PREDNISONE 20 MG PO TABS: 40.0000 mg | ORAL_TABLET | Freq: Every day | ORAL | Status: DC

## 2020-05-04 MED ORDER — TRAZODONE HCL 50 MG PO TABS
50.0000 mg | ORAL_TABLET | Freq: Every day | ORAL | Status: DC
  Administered 2020-05-04 – 2020-05-05 (×2): 50 mg via ORAL
  Filled 2020-05-04 (×2): qty 1

## 2020-05-04 MED ORDER — LOSARTAN POTASSIUM 50 MG PO TABS
25.0000 mg | ORAL_TABLET | Freq: Every day | ORAL | Status: DC
  Administered 2020-05-04 – 2020-05-06 (×3): 25 mg via ORAL
  Filled 2020-05-04 (×3): qty 1

## 2020-05-04 MED ORDER — SODIUM CHLORIDE 0.9 % IV SOLN
500.0000 mg | Freq: Once | INTRAVENOUS | Status: AC
  Administered 2020-05-04: 500 mg via INTRAVENOUS
  Filled 2020-05-04: qty 500

## 2020-05-04 MED ORDER — ACETAMINOPHEN 325 MG PO TABS: 650.0000 mg | ORAL_TABLET | Freq: Four times a day (QID) | ORAL | Status: DC | PRN

## 2020-05-04 MED ORDER — ORAL CARE MOUTH RINSE
15.0000 mL | Freq: Two times a day (BID) | OROMUCOSAL | Status: DC
  Administered 2020-05-04 – 2020-05-05 (×3): 15 mL via OROMUCOSAL

## 2020-05-04 MED ORDER — ALBUTEROL SULFATE (2.5 MG/3ML) 0.083% IN NEBU
2.5000 mg | INHALATION_SOLUTION | Freq: Three times a day (TID) | RESPIRATORY_TRACT | Status: DC
  Administered 2020-05-05 – 2020-05-06 (×4): 2.5 mg via RESPIRATORY_TRACT
  Filled 2020-05-04 (×4): qty 3

## 2020-05-04 MED ORDER — SODIUM CHLORIDE 0.9 % IV SOLN: INTRAVENOUS | Status: AC

## 2020-05-04 MED ORDER — UMECLIDINIUM BROMIDE 62.5 MCG/INH IN AEPB
1.0000 | INHALATION_SPRAY | Freq: Every day | RESPIRATORY_TRACT | Status: DC
  Filled 2020-05-04: qty 7

## 2020-05-04 MED ORDER — IOHEXOL 350 MG/ML SOLN
100.0000 mL | Freq: Once | INTRAVENOUS | Status: AC | PRN
  Administered 2020-05-04: 64 mL via INTRAVENOUS

## 2020-05-04 MED ORDER — PROPRANOLOL HCL 20 MG PO TABS: 20.0000 mg | ORAL_TABLET | Freq: Two times a day (BID) | ORAL | Status: DC

## 2020-05-04 MED ORDER — NITROGLYCERIN 0.4 MG SL SUBL: 0.4000 mg | SUBLINGUAL_TABLET | SUBLINGUAL | Status: DC | PRN

## 2020-05-04 MED ORDER — FLUTICASONE PROPIONATE 50 MCG/ACT NA SUSP
2.0000 | Freq: Every day | NASAL | Status: DC
  Administered 2020-05-05: 2 via NASAL
  Filled 2020-05-04: qty 16

## 2020-05-04 MED ORDER — ACETAMINOPHEN 650 MG RE SUPP: 650.0000 mg | Freq: Four times a day (QID) | RECTAL | Status: DC | PRN

## 2020-05-04 MED ORDER — SODIUM CHLORIDE 0.9 % IV SOLN: 500.0000 mg | INTRAVENOUS | Status: DC

## 2020-05-04 MED ORDER — ALBUTEROL SULFATE (2.5 MG/3ML) 0.083% IN NEBU: 2.5000 mg | INHALATION_SOLUTION | RESPIRATORY_TRACT | Status: DC | PRN

## 2020-05-04 MED ORDER — PANTOPRAZOLE SODIUM 40 MG PO TBEC: 40.0000 mg | DELAYED_RELEASE_TABLET | Freq: Every day | ORAL | Status: DC

## 2020-05-04 MED ORDER — INSULIN ASPART 100 UNIT/ML ~~LOC~~ SOLN
3.0000 [IU] | Freq: Three times a day (TID) | SUBCUTANEOUS | Status: DC
  Administered 2020-05-05 – 2020-05-06 (×4): 3 [IU] via SUBCUTANEOUS

## 2020-05-04 MED ORDER — ATORVASTATIN CALCIUM 10 MG PO TABS
20.0000 mg | ORAL_TABLET | Freq: Every day | ORAL | Status: DC
  Administered 2020-05-04 – 2020-05-06 (×3): 20 mg via ORAL
  Filled 2020-05-04 (×3): qty 2

## 2020-05-04 MED ORDER — LEVOTHYROXINE SODIUM 88 MCG PO TABS
88.0000 ug | ORAL_TABLET | Freq: Every day | ORAL | Status: DC
  Administered 2020-05-05 – 2020-05-06 (×2): 88 ug via ORAL
  Filled 2020-05-04 (×2): qty 1

## 2020-05-04 NOTE — ED Triage Notes (Signed)
EMS reports from home, Dx with PNA Saturday at [facility]. DC home with ABX, Prednisone and O2 @ 3lts. Pt states O2 line got kinked at home, began to experience SOB, corrected line, gave self Albuterol treatment at home and states SOB resolved at baseline post PNA Dx. Hx of COPD  BP 136/92 HR 96 RR 20 Sp02 98 3ltrs CBG 157 Temp 96.4

## 2020-05-04 NOTE — Progress Notes (Incomplete)
Cardiology Office Note   Date:  05/04/2020   ID:  [name], [name] September 19, 1945, MRN [id number]  PCP:  Shirlean Mylar, MD  Cardiologist:   Carlena Bjornstad   No chief complaint on file.    History of Present Illness: [name] [name] is a 75 y.o. female with moderate non-obstructive CAD, hypertension, hyperlipidemia, GERD, and COPD here for follow up. She was initially seen for the evaluation of chest tightness.  Her symptoms began 10/2016.  She saw Dr. Delton Coombes and reported shortness of breath and wheezing.  She was started on prednisone and Azithromycin.  She was seen at [facility] clinic 11/2016 due to chest tightness.  EKG revealed sinus rhythm and no ischemic changes.  There was concern that this was still a COPD exacerbation and prednisone was resumed.  She was referred for coronary CT-A 02/06/2017 that revealed moderate, diffuse calcification and atherosclerosis predominantly in the descending thoracic aorta.  She also had 25 to 50% left main disease with 25%-50% LAD disease with concern for an intramyocardial bridge.  There was also a greater than 70% left circumflex disease.  She was referred for left heart catheterization 02/09/2017 that revealed 10 to 40% nonobstructive disease.  No left main stenosis was noted.  Since that time she has been feeling well.  She has not had any chest pain.   Since her last appointment [name] [name] has been doing well from a cardiac standpoint.  She recently saw her pulmonologist and was treated for acute on chronic pulmonary disease.  Her symptoms have improved but she continues to wheeze somewhat.  She notes that her breathing is always more difficult when the weather is hot or cold.  She is nervous because she travels to [place] next week and thinks her lungs are going to flare.  Her brother is critically ill and will likely pass soon.  She has not been experiencing any chest pain.  She has no lower extremity edema, orthopnea, or PND.  She is on  warfarin chronically for prior DVT/PE.  She was previously on Xarelto but was unable to afford it.  She is not getting much exercise due to her underlying lung disease.  She has been trying to avoid crowds due to the coronavirus.  Today, she   Denies any chest pain, shortness of breath, palpitations, lightheadedness/dizziness, lower extremity edema, orthopnea or PND.   Past Medical History:  Diagnosis Date   Allergic rhinitis, cause unspecified    CAD in native artery 07/25/2017   Chronic airway obstruction, not elsewhere classified    Depressive disorder, not elsewhere classified    Nephritis and nephropathy, not specified as acute or chronic, with unspecified pathological lesion in kidney    Obesity, unspecified    Other and unspecified hyperlipidemia    Oxygen deficiency    Thyrotoxicosis without mention of goiter or other cause, without mention of thyrotoxic crisis or storm    Unspecified essential hypertension    UTI (urinary tract infection) 2019 dec    Past Surgical History:  Procedure Laterality Date   CHOLECYSTECTOMY     LEFT HEART CATH AND CORONARY ANGIOGRAPHY N/A 03/01/2017   Procedure: LEFT HEART CATH AND CORONARY ANGIOGRAPHY;  Surgeon: Yvonne Kendall, MD;  Location: [facility] INVAS[facility];  Service: Cardiovascular;  Laterality: N/A;     Current Outpatient Medications  Medication Sig Dispense Refill   albuterol (PROVENTIL) (2.5 MG/3ML) 0.083% nebulizer solution Take 3 mLs (2.5 mg total) by nebulization every 4 (four) hours as needed for  wheezing or shortness of breath (DX: COPD 496). 150 mL 12   amoxicillin-clavulanate (AUGMENTIN) 875-125 MG tablet Take 1 tablet by mouth 2 (two) times daily. 14 tablet 0   Ascorbic Acid (VITAMIN C) 1000 MG tablet Take 1,000 mg by mouth daily.     atenolol (TENORMIN) 50 MG tablet Take 50 mg by mouth daily.     atorvastatin (LIPITOR) 20 MG tablet Take 20 mg by mouth daily.     azithromycin (ZITHROMAX) 250 MG tablet 500mg   (two tablets) today, then 250mg  (1 tablet) for the next 4 days 6 tablet 0   Budeson-Glycopyrrol-Formoterol (BREZTRI AEROSPHERE) 160-9-4.8 MCG/ACT AERO Inhale 2 puffs into the lungs 2 (two) times daily. 4.8 g 0   Budeson-Glycopyrrol-Formoterol (BREZTRI AEROSPHERE) 160-9-4.8 MCG/ACT AERO Inhale 2 puffs into the lungs in the morning and at bedtime. 10.7 g 0   cefpodoxime (VANTIN) 200 MG tablet Take 1 tablet (200 mg total) by mouth 2 (two) times daily for 10 days. 20 tablet 0   doxycycline (VIBRAMYCIN) 100 MG capsule Take 1 capsule (100 mg total) by mouth 2 (two) times daily. 20 capsule 0   esomeprazole (NEXIUM) 40 MG capsule TAKE 1 CAPSULE (40 MG TOTAL) BY MOUTH 2 (TWO) TIMES DAILY BEFORE A MEAL. 60 capsule 2   ferrous sulfate 325 (65 FE) MG tablet Take 325 mg by mouth daily.     fexofenadine (ALLEGRA) 180 MG tablet Take 180 mg by mouth daily.     fluticasone (FLONASE) 50 MCG/ACT nasal spray SPRAY 2 SPRAYS INTO EACH NOSTRIL EVERY DAY 16 mL 5   levothyroxine (SYNTHROID) 88 MCG tablet Take 88 mcg by mouth daily.     losartan (COZAAR) 25 MG tablet Take 25 mg by mouth daily.     metFORMIN (GLUCOPHAGE-XR) 500 MG 24 hr tablet Take 500 mg by mouth daily.     Multiple Vitamins-Minerals (CENTRUM PO) Take 1 tablet by mouth daily.     nitroGLYCERIN (NITROSTAT) 0.4 MG SL tablet Place 1 tablet (0.4 mg total) under the tongue every 5 (five) minutes as needed for chest pain. 25 tablet 4   omeprazole (PRILOSEC) 20 MG capsule TAKE 1 CAPSULE (20 MG TOTAL) BY MOUTH 2 (TWO) TIMES DAILY BEFORE A MEAL. 30 capsule 5   ONE TOUCH ULTRA TEST test strip As directed     predniSONE (DELTASONE) 10 MG tablet Take 4 tablets X 3 days, 3 tabs X 3 days, 2 tabs x 3 days, 1 tab x 3 days 30 tablet 0   predniSONE (DELTASONE) 20 MG tablet Take 2 tablets (40 mg total) by mouth daily for 4 days. 8 tablet 0   PROVENTIL HFA 108 (90 Base) MCG/ACT inhaler INHALE 2 PUFFS BY MOUTH INTO LUNGS EVERY SIX HOURS AS NEEDED FOR WHEEZING  OR SHORTNESS OF BREATH 18 g 3   Tiotropium Bromide-Olodaterol (STIOLTO RESPIMAT) 2.5-2.5 MCG/ACT AERS Inhale 2 puffs into the lungs daily. 2 Inhaler 0   traZODone (DESYREL) 50 MG tablet Take 50 mg by mouth at bedtime.      venlafaxine XR (EFFEXOR-XR) 150 MG 24 hr capsule Take 150 mg by mouth daily.      No current facility-administered medications for this visit.    Allergies:   Lisinopril and Sulfonamide derivatives    Social History:  The patient  reports that she quit smoking about 26 years ago. She has a 60.00 pack-year smoking history. She has never used smokeless tobacco. She reports that she does not drink alcohol and does not use drugs.   Family  History:  The patient's family history includes CAD in her mother; Cancer in her brother; Diabetes in her brother and mother; Heart failure in her mother; Hyperlipidemia in her mother; Hypertension in her mother; Stroke in her maternal grandfather.    ROS:  Please see the history of present illness.  (+) All other systems are reviewed and negative.    PHYSICAL EXAM: VS:  There were no vitals taken for this visit. , BMI There is no height or weight on file to calculate BMI. GENERAL:  Well appearing.  No acute distress. HEENT:  Pupils equal round and reactive, fundi not visualized, oral mucosa unremarkable NECK:  No jugular venous distention, waveform within normal limits, carotid upstroke brisk and symmetric, no bruits, no thyromegaly LUNGS:  *Mild expiratory wheezing diffusely.  HEART: *Tachycardic.  Regular rhythm.  PMI not displaced or sustained,S1 and S2 within normal limits, no S3, no S4, no clicks, no rubs, no murmurs ABD:  Flat, positive bowel sounds normal in frequency in pitch, no bruits, no rebound, no guarding, no midline pulsatile mass, no hepatomegaly, no splenomegaly EXT:  *2 plus pulses throughout, no edema, no cyanosis no clubbing SKIN:  No rashes no nodules NEURO:  Cranial nerves II through XII grossly intact, motor  grossly intact throughout PSYCH:  Cognitively intact, oriented to person place and time   EKG:  EKG is ordered today. 11/20/16: Sinus rhythm.  Rate 90 bpm. 12/09/18: Sinus tachycardia.  Rate 117 bpm.  05/04/2020:  CT ANGIOGRAPHY CHEST WITH CONTRAST 02/25/2020: FINDINGS: Cardiovascular: This is a technically adequate evaluation of the pulmonary vasculature. No filling defects or pulmonary emboli.  The heart is unremarkable without pericardial effusion. No evidence of thoracic aortic aneurysm or dissection. Extensive atherosclerosis throughout the thoracic aorta and coronary vasculature.  Mediastinum/Nodes: No enlarged mediastinal, hilar, or axillary lymph nodes. Thyroid gland, trachea, and esophagus demonstrate no significant findings.  Lungs/Pleura: Bibasilar areas of subpleural scarring, with scattered lower lobe ground-glass opacities which could reflect hypoventilatory change, inflammation, or early infection. No effusion or pneumothorax. Central airways are patent.  Upper Abdomen: No acute abnormality.  Small hiatal hernia is noted.  Musculoskeletal: There are no acute displaced fractures. Chronic right posterolateral sixth and seventh rib fractures are noted. Reconstructed images demonstrate no additional findings.  Review of the MIP images confirms the above findings.  IMPRESSION: 1. No evidence of pulmonary embolus. 2. Chronic bibasilar subpleural scarring, with scattered ground-glass opacities likely reflecting sequela of bronchiolitis. 3.  Aortic Atherosclerosis (ICD10-I70.0).   Recent Labs: 02/24/2020: NT-Pro BNP 193 05/01/2020: ALT 11; B Natriuretic Peptide 141.3; BUN 19; Creatinine, Ser 0.85; Potassium 4.3; Sodium 138 05/04/2020: Hemoglobin 14.3; Platelets 380    06/20/16: Sodium 141, potassium 4.2, BUN 10, creatinine 0.95 AST 25, ALT 23 Hemoglobin A1c 6.7% Total cholesterol 130, triglycerides 158 HDL 43 LDL 55 TSH 0.96  07/03/2017: Total  cholesterol 140, triglycerides 243, HDL 36, LDL 56 Exline hemoglobin A1c 7.4% Hemoglobin 12.5 Potassium 4.5, creatinine 0.93 TSH 2.17   Lipid Panel No results found for: CHOL, TRIG, HDL, CHOLHDL, VLDL, LDLCALC, LDLDIRECT    Wt Readings from Last 3 Encounters:  05/01/20 140 lb (63.5 kg)  04/13/20 141 lb 6.4 oz (64.1 kg)  02/24/20 141 lb 9.6 oz (64.2 kg)      ASSESSMENT AND PLAN: No problem-specific Assessment & Plan notes found for this encounter.   # Non-obstructive CAD:  # Hyperlipidemia: No angina.  LDL was 52 on 01/2018.  Continue atorvastatin, aspirin, atenolol.  # Hypertension:  Blood pressure was initially  elevated but better on repeat.  She struggles with the walk to the exam room which also contributes to her tachycardia.   # Prior DVT/PE: On warfarin.  Current medicines are reviewed at length with the patient today.  The patient does not have concerns regarding medicines.  The following changes have been made:  no change  Labs/ tests ordered today include:   No orders of the defined types were placed in this encounter.    Disposition:   FU with [name] C. Duke Salvia, MD, [facility] in one year.    I,Mathew Stumpf,acting as a Neurosurgeon for Chilton Si, MD.,have documented all relevant documentation on the behalf of Chilton Si, MD,as directed by  Chilton Si, MD while in the presence of Chilton Si, MD.  ***  Signed, [name] C. Duke Salvia, MD, [facility]  05/04/2020 8:29 AM    [facility]

## 2020-05-04 NOTE — ED Provider Notes (Signed)
[facility]-EMERGENCY DEPT Provider Note   CSN: [id number] Arrival date & time: 05/04/20  [id number]     History Chief Complaint  Patient presents with  . Shortness of Breath    [name] [name] is a 75 y.o. female.  HPI Patient has been dealing with shortness of breath for about 10 days.  At baseline she does have COPD.  She wears 3 L of oxygen at home, at baseline on as needed basis.  She reports when she gets symptoms of a COPD exacerbation she wears a continuously.  She has been on her 3 L continuously now for a while.  She denies that she is having any chest pain.  She reports earlier when it started there was some discomfort she indicates around the lower aspect of the left chest wall.  She also reports an onset she was experiencing some hallucinations.  She reports those have resolved.  Patient was seen 3 days ago.  At that time was ascertained that she had been on Augmentin.  Prescription was changed to doxycycline and Vantin.  Patient reports that she has been taking the doxycycline but the Vantin has not come into the pharmacy yet as opposed to coming today.  She has not regularly been using the albuterol nebulizer.  She thought she was already on a lot of medications and that would not be beneficial necessarily.  She thought that the oxygen may be took the place of that nebulizer therapy.  After speaking with her physician, she did take a nebulizer treatment this morning about an hour prior to arrival.  She reports it was helpful.    Past Medical History:  Diagnosis Date  . Allergic rhinitis, cause unspecified   . CAD in native artery 07/25/2017  . Chronic airway obstruction, not elsewhere classified   . Depressive disorder, not elsewhere classified   . Nephritis and nephropathy, not specified as acute or chronic, with unspecified pathological lesion in kidney   . Obesity, unspecified   . Other and unspecified hyperlipidemia   . Oxygen deficiency   .  Thyrotoxicosis without mention of goiter or other cause, without mention of thyrotoxic crisis or storm   . Unspecified essential hypertension   . UTI (urinary tract infection) 2019 dec    Patient Active Problem List   Diagnosis Date Noted  . Shortness of breath 02/24/2020  . History of pulmonary embolus (PE) 02/24/2020  . Abnormal findings on diagnostic imaging of lung 12/11/2019  . Healthcare maintenance 11/18/2019  . Acute non-recurrent ethmoidal sinusitis 11/18/2019  . Close exposure to COVID-19 virus 01/01/2019  . Chronic respiratory failure with hypoxia (HCC) 07/30/2018  . Iron deficiency anemia 01/14/2018  . Pulmonary embolism (HCC) 07/26/2017  . CAD in native artery 07/25/2017  . Abnormal cardiac CT angiography 02/21/2017  . Chest pain with high risk of acute coronary syndrome 02/21/2017  . Non-insulin treated type 2 diabetes mellitus (HCC) 02/21/2017  . GERD (gastroesophageal reflux disease) 12/05/2016  . Vocal cord dysfunction 11/06/2014  . Cough 06/04/2013  . Thyrotoxicosis 10/31/2006  . Dyslipidemia 10/31/2006  . OBESITY 10/31/2006  . DEPRESSION 10/31/2006  . Essential hypertension 10/31/2006  . Allergic rhinitis 10/31/2006  . COPD (chronic obstructive pulmonary disease) (HCC) 10/31/2006  . GLOMERULONEPHRITIS 10/31/2006    Past Surgical History:  Procedure Laterality Date  . CHOLECYSTECTOMY    . LEFT HEART CATH AND CORONARY ANGIOGRAPHY N/A 03/01/2017   Procedure: LEFT HEART CATH AND CORONARY ANGIOGRAPHY;  Surgeon: Yvonne KendallEnd, Christopher, MD;  Location: [facility] INVASIVE CV  LAB;  Service: Cardiovascular;  Laterality: N/A;     OB History   No obstetric history on file.     Family History  Problem Relation Age of Onset  . Heart failure Mother   . Hyperlipidemia Mother   . Hypertension Mother   . Diabetes Mother   . CAD Mother   . Cancer Brother   . Stroke Maternal Grandfather   . Diabetes Brother     Social History   Tobacco Use  . Smoking status: Former Smoker     Packs/day: 2.00    Years: 30.00    Pack years: 60.00    Quit date: 01/09/1994    Years since quitting: 26.3  . Smokeless tobacco: Never Used  Vaping Use  . Vaping Use: Never used  Substance Use Topics  . Alcohol use: No  . Drug use: Never    Home Medications Prior to Admission medications   Medication Sig Start Date End Date Taking? Authorizing Provider  albuterol (PROVENTIL) (2.5 MG/3ML) 0.083% nebulizer solution Take 3 mLs (2.5 mg total) by nebulization every 4 (four) hours as needed for wheezing or shortness of breath (DX: COPD 496). 06/04/13  Yes Leslye Peer, MD  Ascorbic Acid (VITAMIN C) 1000 MG tablet Take 1,000 mg by mouth daily.   Yes [provider]  atorvastatin (LIPITOR) 20 MG tablet Take 20 mg by mouth daily.   Yes [provider]  Budeson-Glycopyrrol-Formoterol (BREZTRI AEROSPHERE) 160-9-4.8 MCG/ACT AERO Inhale 2 puffs into the lungs in the morning and at bedtime. 02/24/20  Yes [name], NP  doxycycline (VIBRAMYCIN) 100 MG capsule Take 1 capsule (100 mg total) by mouth 2 (two) times daily. 05/01/20  Yes Tegeler, Canary Brim, MD  esomeprazole (NEXIUM) 40 MG capsule TAKE 1 CAPSULE (40 MG TOTAL) BY MOUTH 2 (TWO) TIMES DAILY BEFORE A MEAL. 04/05/20  Yes Leslye Peer, MD  ferrous sulfate 325 (65 FE) MG tablet Take 325 mg by mouth daily. 10/19/18  Yes [provider]  fexofenadine (ALLEGRA) 180 MG tablet Take 180 mg by mouth daily.   Yes [provider]  fluticasone (FLONASE) 50 MCG/ACT nasal spray SPRAY 2 SPRAYS INTO EACH NOSTRIL EVERY DAY Patient taking differently: Place 2 sprays into both nostrils daily. 01/12/20  Yes Leslye Peer, MD  levothyroxine (SYNTHROID) 88 MCG tablet Take 88 mcg by mouth daily. 10/09/18  Yes [provider]  losartan (COZAAR) 25 MG tablet Take 25 mg by mouth daily.   Yes [provider]  metFORMIN (GLUCOPHAGE-XR) 500 MG 24 hr tablet Take 500 mg by mouth every evening. 12/20/18  Yes  [provider]  Multiple Vitamins-Minerals (CENTRUM PO) Take 1 tablet by mouth daily.   Yes [provider]  nitroGLYCERIN (NITROSTAT) 0.4 MG SL tablet Place 1 tablet (0.4 mg total) under the tongue every 5 (five) minutes as needed for chest pain. 02/21/17 01/01/18 Yes Kilroy, Eda Paschal, PA-C  predniSONE (DELTASONE) 20 MG tablet Take 2 tablets (40 mg total) by mouth daily for 4 days. 05/02/20 05/06/20 Yes Tegeler, Canary Brim, MD  propranolol (INDERAL) 20 MG tablet Take 20 mg by mouth daily. 04/06/20  Yes [provider]  PROVENTIL HFA 108 (90 Base) MCG/ACT inhaler INHALE 2 PUFFS BY MOUTH INTO LUNGS EVERY SIX HOURS AS NEEDED FOR WHEEZING OR SHORTNESS OF BREATH Patient taking differently: Inhale 2 puffs into the lungs every 6 (six) hours as needed for wheezing or shortness of breath. 12/11/19  Yes Leslye Peer, MD  Tiotropium Bromide-Olodaterol (  STIOLTO RESPIMAT) 2.5-2.5 MCG/ACT AERS Inhale 2 puffs into the lungs daily. 02/21/18  Yes Leslye Peer, MD  traMADol (ULTRAM) 50 MG tablet Take 50 mg by mouth 3 (three) times daily as needed for pain. 03/25/20  Yes [provider]  traZODone (DESYREL) 50 MG tablet Take 50 mg by mouth at bedtime.  02/28/13  Yes [provider]  venlafaxine XR (EFFEXOR-XR) 150 MG 24 hr capsule Take 150 mg by mouth daily.  07/03/17  Yes [provider]  amoxicillin-clavulanate (AUGMENTIN) 875-125 MG tablet Take 1 tablet by mouth 2 (two) times daily. Patient not taking: No sig reported 11/18/19   [name], NP  azithromycin (ZITHROMAX) 250 MG tablet 500mg  (two tablets) today, then 250mg  (1 tablet) for the next 4 days Patient not taking: No sig reported 02/24/20   , NP  cefpodoxime (VANTIN) 200 MG tablet Take 1 tablet (200 mg total) by mouth 2 (two) times daily for 10 days. 05/01/20 05/11/20  Tegeler, 05/03/20, MD  omeprazole (PRILOSEC) 20 MG capsule TAKE 1 CAPSULE (20 MG TOTAL) BY MOUTH 2 (TWO) TIMES DAILY BEFORE  A MEAL. Patient not taking: No sig reported 10/07/18   Canary Brim, MD  ONE TOUCH ULTRA TEST test strip As directed 05/18/11   [provider]  predniSONE (DELTASONE) 10 MG tablet Take 4 tablets X 3 days, 3 tabs X 3 days, 2 tabs x 3 days, 1 tab x 3 days 04/13/20   07/18/11, MD    Allergies    Lisinopril and Sulfonamide derivatives  Review of Systems   Review of Systems 10 systems reviewed and negative except as per HPI Physical Exam Updated Vital Signs BP 128/78   Pulse (!) 121   Temp 97.9 F (36.6 C) (Oral)   Resp (!) 24   SpO2 100%   Physical Exam Constitutional:      Comments: Alert nontoxic.  Mild tachypnea.  Heart rate on monitor shows to be 130  HENT:     Mouth/Throat:     Pharynx: Oropharynx is clear.  Eyes:     Extraocular Movements: Extraocular movements intact.  Cardiovascular:     Comments: Tachycardic, regular, no appreciable rub murmur gallop Pulmonary:     Comments: Tachypnea.  Speaking in full sentences.  Breath sounds are soft at the bases.  Occasional crackle and wheeze at the mid to lower lung fields. Abdominal:     General: There is no distension.     Palpations: Abdomen is soft.     Tenderness: There is no abdominal tenderness. There is no guarding.  Musculoskeletal:        General: No swelling or tenderness. Normal range of motion.     Right lower leg: No edema.     Left lower leg: No edema.  Skin:    General: Skin is warm and dry.  Neurological:     General: No focal deficit present.     Mental Status: She is oriented to person, place, and time.     Motor: No weakness.     Coordination: Coordination normal.  Psychiatric:        Mood and Affect: Mood normal.     ED Results / Procedures / Treatments   Labs (all labs ordered are listed, but only abnormal results are displayed) Labs Reviewed  COMPREHENSIVE METABOLIC PANEL - Abnormal; Notable for the following components:      Result Value   Chloride 97 (*)    Glucose, Bld  151 (*)    Creatinine, Ser 1.04 (*)    Total Protein 8.2 (*)    GFR, Estimated 56 (*)    All other components within normal limits  RESP PANEL BY RT-PCR (FLU A&B, COVID) ARPGX2  CULTURE, BLOOD (ROUTINE X 2)  CULTURE, BLOOD (ROUTINE X 2)  BRAIN NATRIURETIC PEPTIDE  LACTIC ACID, PLASMA  LACTIC ACID, PLASMA  CBC WITH DIFFERENTIAL/PLATELET  PROTIME-INR  URINALYSIS, ROUTINE W REFLEX MICROSCOPIC  TROPONIN I (HIGH SENSITIVITY)  TROPONIN I (HIGH SENSITIVITY)    EKG None  Radiology DG Chest 2 View  Result Date: 05/04/2020 CLINICAL DATA:  75 year old female with shortness of breath, cough and congestion. Recent pneumonia. Former smoker. EXAM: CHEST - 2 VIEW COMPARISON:  Portable chest 05/31/2020 and earlier. FINDINGS: Improved lung volumes with somewhat large chronic lung volumes again noted. Mediastinal contours remain normal. Visualized tracheal air column is within normal limits. Chronic increased interstitial markings in both lungs currently with no pneumothorax, pulmonary edema, pleural effusion or confluent pulmonary opacity. No acute osseous abnormality identified. Stable cholecystectomy clips. Negative visible bowel gas pattern. Abdominal Calcified aortic atherosclerosis. IMPRESSION: Chronic lung disease with no acute cardiopulmonary abnormality. Electronically Signed   By: Odessa Fleming M.D.   On: 05/04/2020 08:11   CT Angio Chest PE W/Cm &/Or Wo Cm  Result Date: 05/04/2020 CLINICAL DATA:  Shortness of breath. EXAM: CT ANGIOGRAPHY CHEST WITH CONTRAST TECHNIQUE: Multidetector CT imaging of the chest was performed using the standard protocol during bolus administration of intravenous contrast. Multiplanar CT image reconstructions and MIPs were obtained to evaluate the vascular anatomy. CONTRAST:  64mL OMNIPAQUE IOHEXOL 350 MG/ML SOLN COMPARISON:  February 25, 2020. FINDINGS: Cardiovascular: Satisfactory opacification of the pulmonary arteries to the segmental level. No evidence of pulmonary  embolism. Normal heart size. No pericardial effusion. Atherosclerosis of thoracic aorta is noted without aneurysm or dissection. Mediastinum/Nodes: No enlarged mediastinal, hilar, or axillary lymph nodes. Thyroid gland, trachea, and esophagus demonstrate no significant findings. Lungs/Pleura: No pneumothorax or pleural effusion is noted. Mildly increased reticular and nodular densities are noted in the left upper lobe concerning for atypical infection. Upper Abdomen: No acute abnormality. Musculoskeletal: No chest wall abnormality. No acute or significant osseous findings. Review of the MIP images confirms the above findings. IMPRESSION: No definite evidence of pulmonary embolus. Mildly increased reticular and nodular densities are noted in the left upper lobe concerning for atypical inflammation or infection, such as mycobacterium. Aortic Atherosclerosis (ICD10-I70.0). Electronically Signed   By: Lupita Raider M.D.   On: 05/04/2020 14:38    Procedures Procedures   Medications Ordered in ED Medications  azithromycin (ZITHROMAX) 500 mg in sodium chloride 0.9 % 250 mL IVPB (has no administration in time range)  ipratropium-albuterol (DUONEB) 0.5-2.5 (3) MG/3ML nebulizer solution 3 mL (3 mLs Nebulization Given 05/04/20 1227)  iohexol (OMNIPAQUE) 350 MG/ML injection 100 mL (64 mLs Intravenous Contrast Given 05/04/20 1400)    ED Course  I have reviewed the triage vital signs and the nursing notes.  Pertinent labs & imaging results that were available during my care of the patient were reviewed by me and considered in my medical decision making (see chart for details).    MDM Rules/Calculators/A&P                          Consult: Dr. David Stall for admission.  Patient presents with ongoing dyspnea and exertional dyspnea after having had outpatient treatment with antibiotics and steroids for COPD exacerbation and  possible pneumonia.  Patient has had 3 days of doxycycline was to be started on  cefpodoxime but has not failed to get that prescription filled yet.  Prior to that she had been taking Augmentin.  She reports she continues to get worse and is increasingly short of breath with minor activity.  She reports she is wearing her 3 L oxygen continuously.  At baseline she only needs it on a as needed basis.  CT scan obtained to rule out PE.  Findings suggest a atypical left upper lobe infiltrate.  At this time, with lack of outpatient improvement we will plan for admission for further diagnostic evaluation and treatment as indicated. Final Clinical Impression(s) / ED Diagnoses Final diagnoses:  COPD exacerbation (HCC)  Atypical pneumonia    Rx / DC Orders ED Discharge Orders    None       Arby Barrette, MD 05/04/20 1601

## 2020-05-04 NOTE — H&P (Signed)
History and Physical    [name] [name] ZOX:[id number] DOB: 16-Oct-1945 DOA: 05/04/2020  PCP: Shirlean Mylar, MD  Patient coming from: home  Chief Complaint: SOB and cough  HPI: [name] [name] is a 75 y.o. female with medical history significant of past medical history of COPD, chronic cough as per Dr. Kavin Leech note on 04/13/2020 she is on 2 L of oxygen chronically for chronic hypoxic respiratory failure essential hypertension recently seen by her primary care doctor and placed on Doxy and Vantin she started taking the Doxy but her pharmacy had trouble getting the Vantin comes into the ED for worsening cough and shortness of breath she relates she needed to increase her oxygen to catch her breath just while at rest.  She relates he continues to have a productive cough yellow in color and in large amounts.  She has remained afebrile has not been able to eat significantly as she feels tired has been able to keep hydrated at home.  Denies any nausea vomiting or diarrhea.  ED Course:  Was found to be tachycardic with an increase in her creatinine to 1 (with a baseline creatinine of less than 0.7), afebrile with a white count of 9.  SARS-CoV-2 electrolytes are PCR negative.  CT angio of the chest showed no PE but today showed groundglass opacity in the left upper lung  Review of Systems: As per HPI otherwise all other systems reviewed and are negative.  Past Medical History:  Diagnosis Date  . Allergic rhinitis, cause unspecified   . CAD in native artery 07/25/2017  . Chronic airway obstruction, not elsewhere classified   . Depressive disorder, not elsewhere classified   . Nephritis and nephropathy, not specified as acute or chronic, with unspecified pathological lesion in kidney   . Obesity, unspecified   . Other and unspecified hyperlipidemia   . Oxygen deficiency   . Thyrotoxicosis without mention of goiter or other cause, without mention of thyrotoxic crisis or storm   . Unspecified  essential hypertension   . UTI (urinary tract infection) 2019 dec    Past Surgical History:  Procedure Laterality Date  . CHOLECYSTECTOMY    . LEFT HEART CATH AND CORONARY ANGIOGRAPHY N/A 03/01/2017   Procedure: LEFT HEART CATH AND CORONARY ANGIOGRAPHY;  Surgeon: Yvonne Kendall, MD;  Location: [facility];  Service: Cardiovascular;  Laterality: N/A;    Social History  reports that she quit smoking about 26 years ago. She has a 60.00 pack-year smoking history. She has never used smokeless tobacco. She reports that she does not drink alcohol and does not use drugs.  Allergies  Allergen Reactions  . Lisinopril     cough  . Sulfonamide Derivatives Rash    Family History  Problem Relation Age of Onset  . Heart failure Mother   . Hyperlipidemia Mother   . Hypertension Mother   . Diabetes Mother   . CAD Mother   . Cancer Brother   . Stroke Maternal Grandfather   . Diabetes Brother      Prior to Admission medications   Medication Sig Start Date End Date Taking? Authorizing Provider  albuterol (PROVENTIL) (2.5 MG/3ML) 0.083% nebulizer solution Take 3 mLs (2.5 mg total) by nebulization every 4 (four) hours as needed for wheezing or shortness of breath (DX: COPD 496). 06/04/13  Yes Leslye Peer, MD  Ascorbic Acid (VITAMIN C) 1000 MG tablet Take 1,000 mg by mouth daily.   Yes [provider]  atorvastatin (LIPITOR) 20 MG tablet Take  20 mg by mouth daily.   Yes [provider]  Budeson-Glycopyrrol-Formoterol (BREZTRI AEROSPHERE) 160-9-4.8 MCG/ACT AERO Inhale 2 puffs into the lungs in the morning and at bedtime. 02/24/20  Yes [name], NP  doxycycline (VIBRAMYCIN) 100 MG capsule Take 1 capsule (100 mg total) by mouth 2 (two) times daily. 05/01/20  Yes Tegeler, Canary Brim, MD  esomeprazole (NEXIUM) 40 MG capsule TAKE 1 CAPSULE (40 MG TOTAL) BY MOUTH 2 (TWO) TIMES DAILY BEFORE A MEAL. 04/05/20  Yes Leslye Peer, MD  ferrous sulfate 325 (65 FE) MG tablet  Take 325 mg by mouth daily. 10/19/18  Yes [provider]  fexofenadine (ALLEGRA) 180 MG tablet Take 180 mg by mouth daily.   Yes [provider]  fluticasone (FLONASE) 50 MCG/ACT nasal spray SPRAY 2 SPRAYS INTO EACH NOSTRIL EVERY DAY Patient taking differently: Place 2 sprays into both nostrils daily. 01/12/20  Yes Leslye Peer, MD  levothyroxine (SYNTHROID) 88 MCG tablet Take 88 mcg by mouth daily. 10/09/18  Yes [provider]  losartan (COZAAR) 25 MG tablet Take 25 mg by mouth daily.   Yes [provider]  metFORMIN (GLUCOPHAGE-XR) 500 MG 24 hr tablet Take 500 mg by mouth every evening. 12/20/18  Yes [provider]  Multiple Vitamins-Minerals (CENTRUM PO) Take 1 tablet by mouth daily.   Yes [provider]  nitroGLYCERIN (NITROSTAT) 0.4 MG SL tablet Place 1 tablet (0.4 mg total) under the tongue every 5 (five) minutes as needed for chest pain. 02/21/17 01/01/18 Yes Kilroy, Eda Paschal, PA-C  predniSONE (DELTASONE) 20 MG tablet Take 2 tablets (40 mg total) by mouth daily for 4 days. 05/02/20 05/06/20 Yes Tegeler, Canary Brim, MD  propranolol (INDERAL) 20 MG tablet Take 20 mg by mouth daily. 04/06/20  Yes [provider]  PROVENTIL HFA 108 (90 Base) MCG/ACT inhaler INHALE 2 PUFFS BY MOUTH INTO LUNGS EVERY SIX HOURS AS NEEDED FOR WHEEZING OR SHORTNESS OF BREATH Patient taking differently: Inhale 2 puffs into the lungs every 6 (six) hours as needed for wheezing or shortness of breath. 12/11/19  Yes Leslye Peer, MD  Tiotropium Bromide-Olodaterol (STIOLTO RESPIMAT) 2.5-2.5 MCG/ACT AERS Inhale 2 puffs into the lungs daily. 02/21/18  Yes Leslye Peer, MD  traMADol (ULTRAM) 50 MG tablet Take 50 mg by mouth 3 (three) times daily as needed for pain. 03/25/20  Yes [provider]  traZODone (DESYREL) 50 MG tablet Take 50 mg by mouth at bedtime.  02/28/13  Yes [provider]  venlafaxine XR (EFFEXOR-XR) 150 MG 24 hr capsule Take  150 mg by mouth daily.  07/03/17  Yes [provider]  amoxicillin-clavulanate (AUGMENTIN) 875-125 MG tablet Take 1 tablet by mouth 2 (two) times daily. Patient not taking: No sig reported 11/18/19   [name], NP  azithromycin (ZITHROMAX) 250 MG tablet 500mg  (two tablets) today, then 250mg  (1 tablet) for the next 4 days Patient not taking: No sig reported 02/24/20   , NP  cefpodoxime (VANTIN) 200 MG tablet Take 1 tablet (200 mg total) by mouth 2 (two) times daily for 10 days. 05/01/20 05/11/20  Tegeler, 05/03/20, MD  omeprazole (PRILOSEC) 20 MG capsule TAKE 1 CAPSULE (20 MG TOTAL) BY MOUTH 2 (TWO) TIMES DAILY BEFORE A MEAL. Patient not taking: No sig reported 10/07/18   Canary Brim, MD  ONE TOUCH ULTRA TEST test strip As directed 05/18/11   [provider]  predniSONE (DELTASONE) 10 MG tablet Take 4 tablets  X 3 days, 3 tabs X 3 days, 2 tabs x 3 days, 1 tab x 3 days 04/13/20   Leslye PeerByrum, Robert S, MD    Physical Exam: Vitals:   05/04/20 1230 05/04/20 1300 05/04/20 1330 05/04/20 1430  BP: (!) 138/97 135/81 128/78 128/78  Pulse: (!) 130 (!) 131 (!) 121 (!) 121  Resp: (!) 22 (!) 30 (!) 24 (!) 24  Temp:      TempSrc:      SpO2: 100% 99% 100% 100%    Constitutional: NAD, calm, comfortable Vitals:   05/04/20 1230 05/04/20 1300 05/04/20 1330 05/04/20 1430  BP: (!) 138/97 135/81 128/78 128/78  Pulse: (!) 130 (!) 131 (!) 121 (!) 121  Resp: (!) 22 (!) 30 (!) 24 (!) 24  Temp:      TempSrc:      SpO2: 100% 99% 100% 100%   Eyes: PERRL, lids and conjunctivae normal ENMT: Mucous membranes are moist. Neck: normal, supple, no masses. Respiratory: She has moderate air movement but still has persistent wheezing mildly bilaterally in both lungs no appreciated crackles. Cardiovascular: Regular rate and rhythm, no murmurs / rubs / gallops. No extremity edema. 2+ pedal pulses. No carotid bruits.  Abdomen: no tenderness, no masses palpated. No hepatosplenomegaly. Bowel  sounds positive.   Skin: no rashes, lesions, ulcers. No induration Psychiatric: Normal judgment and insight. Alert and oriented x 3. Normal mood.    Labs on Admission: I have personally reviewed following labs and imaging studies  CBC: Recent Labs  Lab 05/01/20 1503 05/04/20 0811  WBC 17.7* 8.9  NEUTROABS 14.2* 6.6  HGB 13.3 14.3  HCT 40.4 44.3  MCV 94.4 95.3  PLT 334 380    Basic Metabolic Panel: Recent Labs  Lab 05/01/20 1503 05/04/20 0811  NA 138 140  K 4.3 3.6  CL 96* 97*  CO2 32 31  GLUCOSE 187* 151*  BUN 19 22  CREATININE 0.85 1.04*  CALCIUM 10.2 9.9    GFR: Estimated Creatinine Clearance: 38.9 mL/min (A) (by C-G formula based on SCr of 1.04 mg/dL (H)).  Liver Function Tests: Recent Labs  Lab 05/01/20 1503 05/04/20 0811  AST 11* 19  ALT 11 19  ALKPHOS 71 61  BILITOT 0.6 0.8  PROT 7.4 8.2*  ALBUMIN 3.9 3.8    Urine analysis:    Component Value Date/Time   COLORURINE YELLOW 01/01/2018 1340   APPEARANCEUR HAZY (A) 01/01/2018 1340   LABSPEC 1.019 01/01/2018 1340   PHURINE 5.0 01/01/2018 1340   GLUCOSEU NEGATIVE 01/01/2018 1340   HGBUR NEGATIVE 01/01/2018 1340   BILIRUBINUR NEGATIVE 01/01/2018 1340   KETONESUR NEGATIVE 01/01/2018 1340   PROTEINUR 30 (A) 01/01/2018 1340   NITRITE NEGATIVE 01/01/2018 1340   LEUKOCYTESUR MODERATE (A) 01/01/2018 1340    Radiological Exams on Admission: DG Chest 2 View  Result Date: 05/04/2020 CLINICAL DATA:  75 year old female with shortness of breath, cough and congestion. Recent pneumonia. Former smoker. EXAM: CHEST - 2 VIEW COMPARISON:  Portable chest 05/31/2020 and earlier. FINDINGS: Improved lung volumes with somewhat large chronic lung volumes again noted. Mediastinal contours remain normal. Visualized tracheal air column is within normal limits. Chronic increased interstitial markings in both lungs currently with no pneumothorax, pulmonary edema, pleural effusion or confluent pulmonary opacity. No acute  osseous abnormality identified. Stable cholecystectomy clips. Negative visible bowel gas pattern. Abdominal Calcified aortic atherosclerosis. IMPRESSION: Chronic lung disease with no acute cardiopulmonary abnormality. Electronically Signed   By: Odessa FlemingH  Hall M.D.   On: 05/04/2020 08:11  CT Angio Chest PE W/Cm &/Or Wo Cm  Result Date: 05/04/2020 CLINICAL DATA:  Shortness of breath. EXAM: CT ANGIOGRAPHY CHEST WITH CONTRAST TECHNIQUE: Multidetector CT imaging of the chest was performed using the standard protocol during bolus administration of intravenous contrast. Multiplanar CT image reconstructions and MIPs were obtained to evaluate the vascular anatomy. CONTRAST:  18mL OMNIPAQUE IOHEXOL 350 MG/ML SOLN COMPARISON:  February 25, 2020. FINDINGS: Cardiovascular: Satisfactory opacification of the pulmonary arteries to the segmental level. No evidence of pulmonary embolism. Normal heart size. No pericardial effusion. Atherosclerosis of thoracic aorta is noted without aneurysm or dissection. Mediastinum/Nodes: No enlarged mediastinal, hilar, or axillary lymph nodes. Thyroid gland, trachea, and esophagus demonstrate no significant findings. Lungs/Pleura: No pneumothorax or pleural effusion is noted. Mildly increased reticular and nodular densities are noted in the left upper lobe concerning for atypical infection. Upper Abdomen: No acute abnormality. Musculoskeletal: No chest wall abnormality. No acute or significant osseous findings. Review of the MIP images confirms the above findings. IMPRESSION: No definite evidence of pulmonary embolus. Mildly increased reticular and nodular densities are noted in the left upper lobe concerning for atypical inflammation or infection, such as mycobacterium. Aortic Atherosclerosis (ICD10-I70.0). Electronically Signed   By: Lupita Raider M.D.   On: 05/04/2020 14:38    EKG: Independently reviewed.  None  Assessment/Plan Acute on chronic respiratory failure with hypoxia  (HCC)/atypical pneumonia/Acute COPD exacerbation COPD (chronic obstructive pulmonary disease) (HCC): CT angio of the chest showed no PE but it did showed a groundglass opacity concerning for atypical pneumonia. She has failed outpatient treatment, agree with admitting her to the hospital due to her new oxygen requirement started her on IV Rocephin and azithromycin and also IV steroids as she is wheezing. Continue to monitor oxygen saturation and her fever curve. We will consult physical therapy as she appears weak on physical exam to evaluate for home health PT. Blood cultures have been ordered and are pending.  Acute kidney injury: Likely prerenal azotemia started on IV fluids gently recheck in the morning.  Sinus tachycardia: She was taking recently of the atenolol she cannot explain to me the reason I cannot find that in her chart, as per the chart she supposed to be on metoprolol which we will go ahead and restart her on.  She is not taking this so is probably contributing to her tachycardia. And we will go ahead and start her on this and reevaluate in the morning.  Essential hypertension Continue current home regimen on propranolol reevaluate in the morning.  Diabetes mellitus type 2: We will hold on metformin, she just got contrast will start on long-acting insulin plus sliding scale. She will be on steroids which will make her blood glucose erratic so we will keep a close eye.   DVT prophylaxis: lovenox Code Status:   Full Family Communication:  None Consults called:  None Admission status:  Inpatient  Severity of Illness: The appropriate patient status for this patient is INPATIENT. Inpatient status is judged to be reasonable and necessary in order to provide the required intensity of service to ensure the patient's safety. The patient's presenting symptoms, physical exam findings, and initial radiographic and laboratory data in the context of their chronic comorbidities is felt  to place them at high risk for further clinical deterioration. Furthermore, it is not anticipated that the patient will be medically stable for discharge from the hospital within 2 midnights of admission. The following factors support the patient status of inpatient.   75 year old female with  past medical history of COPD on oxygen at home comes in for worsening dyspnea was found to have an infiltrate on CT negative PE was found to be tachycardic we will start empirically on IV steroids, Rocephin and azithromycin.   * I certify that at the point of admission it is my clinical judgment that the patient will require inpatient hospital care spanning beyond 2 midnights from the point of admission due to high intensity of service, high risk for further deterioration and high frequency of surveillance required.Marinda Elk MD [facility]  How to contact the [facility] Attending or Consulting provider 7A - 7P or covering provider during after hours 7P -7A, for this patient?   1. Check the care team in [facility] and look for a) attending/consulting [facility] provider listed and b) the [facility] team listed 2. Log into [email or web address] and use [facility]'s universal password to access. If you do not have the password, please contact the hospital operator. 3. Locate the [facility] provider you are looking for under [facility] and page to a number that you can be directly reached. 4. If you still have difficulty reaching the provider, please page the [facility] (Director on Call) for the Hospitalists listed on amion for assistance.  05/04/2020, 3:23 PM

## 2020-05-05 DIAGNOSIS — J189 Pneumonia, unspecified organism: Secondary | ICD-10-CM | POA: Diagnosis not present

## 2020-05-05 DIAGNOSIS — J42 Unspecified chronic bronchitis: Secondary | ICD-10-CM | POA: Diagnosis not present

## 2020-05-05 DIAGNOSIS — J9621 Acute and chronic respiratory failure with hypoxia: Secondary | ICD-10-CM | POA: Diagnosis not present

## 2020-05-05 DIAGNOSIS — I10 Essential (primary) hypertension: Secondary | ICD-10-CM | POA: Diagnosis not present

## 2020-05-05 LAB — BASIC METABOLIC PANEL
Anion gap: 8 (ref 5–15)
BUN: 19 mg/dL (ref 8–23)
CO2: 32 mmol/L (ref 22–32)
Calcium: 9.3 mg/dL (ref 8.9–10.3)
Chloride: 102 mmol/L (ref 98–111)
Creatinine, Ser: 0.8 mg/dL (ref 0.44–1.00)
GFR, Estimated: >60 mL/min (ref >60)
Glucose, Bld: 162 mg/dL — ABNORMAL HIGH (ref 70–99)
Potassium: 4.9 mmol/L (ref 3.5–5.1)
Sodium: 142 mmol/L (ref 135–145)

## 2020-05-05 LAB — GLUCOSE, CAPILLARY
Glucose-Capillary: 143 mg/dL — ABNORMAL HIGH (ref 70–99)
Glucose-Capillary: 183 mg/dL — ABNORMAL HIGH (ref 70–99)
Glucose-Capillary: 141 mg/dL — ABNORMAL HIGH (ref 70–99)
Glucose-Capillary: 153 mg/dL — ABNORMAL HIGH (ref 70–99)

## 2020-05-05 LAB — HEMOGLOBIN A1C
Hgb A1c MFr Bld: 6.9 % — ABNORMAL HIGH (ref 4.8–5.6)
Mean Plasma Glucose: 151.33 mg/dL

## 2020-05-05 LAB — HIV ANTIBODY (ROUTINE TESTING W REFLEX): HIV Screen 4th Generation wRfx: NONREACTIVE

## 2020-05-05 MED ORDER — PROPRANOLOL HCL 20 MG PO TABS
20.0000 mg | ORAL_TABLET | Freq: Two times a day (BID) | ORAL | Status: DC
  Filled 2020-05-05: qty 1

## 2020-05-05 MED ORDER — FLUTICASONE FUROATE-VILANTEROL 100-25 MCG/INH IN AEPB
1.0000 | INHALATION_SPRAY | Freq: Every day | RESPIRATORY_TRACT | Status: DC
  Administered 2020-05-06: 1 via RESPIRATORY_TRACT
  Filled 2020-05-05: qty 28

## 2020-05-05 MED ORDER — METOPROLOL TARTRATE 25 MG PO TABS
25.0000 mg | ORAL_TABLET | Freq: Two times a day (BID) | ORAL | Status: DC
  Administered 2020-05-05 (×2): 25 mg via ORAL
  Filled 2020-05-05 (×2): qty 1

## 2020-05-05 MED ORDER — AZITHROMYCIN 250 MG PO TABS
500.0000 mg | ORAL_TABLET | Freq: Every day | ORAL | Status: DC
  Administered 2020-05-05: 500 mg via ORAL
  Filled 2020-05-05: qty 2

## 2020-05-05 MED ORDER — UMECLIDINIUM BROMIDE 62.5 MCG/INH IN AEPB
1.0000 | INHALATION_SPRAY | Freq: Every day | RESPIRATORY_TRACT | Status: DC
  Administered 2020-05-06: 1 via RESPIRATORY_TRACT
  Filled 2020-05-05: qty 7

## 2020-05-05 MED ORDER — PREDNISONE 20 MG PO TABS
40.0000 mg | ORAL_TABLET | Freq: Every day | ORAL | Status: DC
  Administered 2020-05-06: 40 mg via ORAL
  Filled 2020-05-05: qty 2

## 2020-05-05 NOTE — Progress Notes (Signed)
PHARMACIST - PHYSICIAN COMMUNICATION  CONCERNING: Antibiotic IV to Oral Route Change Policy  RECOMMENDATION: This patient is receiving azithromycin by the intravenous route.  Based on criteria approved by the Pharmacy and Therapeutics Committee, the antibiotic(s) is/are being converted to the equivalent oral dose form(s).   DESCRIPTION: These criteria include:  Patient being treated for a respiratory tract infection, urinary tract infection, cellulitis or clostridium difficile associated diarrhea if on metronidazole  The patient is not neutropenic and does not exhibit a GI malabsorption state  The patient is eating (either orally or via tube) and/or has been taking other orally administered medications for a least 24 hours  The patient is improving clinically and has a Tmax < 100.5  If you have questions about this conversion, please contact the Pharmacy Department  []   [phone] )  ( [phone] []   [phone] )  [facility] []   [phone] )  [facility] []   [phone] )  [facility] []   [phone] )  [facility]

## 2020-05-05 NOTE — Progress Notes (Signed)
Nutrition Brief Note  Patient identified on the Malnutrition Screening Tool (MST) Report  Pt reports she had 1 day of poor appetite d/t excessive phlegm. Pt states she feels better today and feels hungry.  Wt Readings from Last 15 Encounters:  05/01/20 63.5 kg  04/13/20 64.1 kg  02/24/20 64.2 kg  12/11/19 66.2 kg  11/18/19 68 kg  07/02/19 67.6 kg  04/16/19 67.6 kg  12/09/18 68.5 kg  07/30/18 68.2 kg  06/07/18 67.3 kg  03/28/18 67.1 kg  03/05/18 66.6 kg  01/14/18 65.6 kg  01/01/18 64.9 kg  12/27/17 67.6 kg    BMI: 27.1 kg/m^2.  Patient meets criteria for overweight based on current BMI.   Current diet order is regular, patient is consuming approximately 100% of meals at this time. Labs and medications reviewed.   No nutrition interventions warranted at this time. If nutrition issues arise, please consult RD.   Tilda Franco, MS, RD, LDN Inpatient Clinical Dietitian Contact information available via Amion

## 2020-05-05 NOTE — Plan of Care (Signed)
  Problem: Nutrition: Goal: Adequate nutrition will be maintained Outcome: Progressing   Problem: Elimination: Goal: Will not experience complications related to bowel motility Outcome: Progressing   Problem: Pain Managment: Goal: General experience of comfort will improve Outcome: Progressing   

## 2020-05-05 NOTE — Progress Notes (Signed)
[facility] PROGRESS NOTE    Progress Note  [name] Pebblesatricia Ann Corker  ZOX:[id number] DOB: 04/29/1945 DOA: 05/04/2020 PCP: Shirlean MylarWebb, Carol, MD     Brief Narrative:   [name] [name] is an 75 y.o. female past medical history significant for COPD, chronic cough as per Dr. Delton CoombesByrum notes on 04/13/2020 she is on 2 L of oxygen chronically for chronic respiratory failure due to hypoxemia, essential hypertension seen by her primary care doctor for shortness of breath was started on Doxy and Vantin which she failed as an outpatient came to into the ED complaining of cough and shortness of breath not improved on antibiotics.   Assessment/Plan:   Acute on chronic respiratory failure with hypoxia/atypical pneumonia/acute COPD exacerbation: CT angio of the chest was negative it did show groundglass opacity in the right upper lung, she has failed outpatient treatment and she did not refill her Vantin as the pharmacy did not have it. She was started empirically on IV Rocephin and azithromycin and empiric antibiotics that she was wheezing and requiring more oxygen than her baseline at home. Currently on 3 L of oxygen satting greater 94% we will try to wean off is much as possible.  Acute kidney injury: Likely due to prerenal azotemia started on IV fluids her creatinine is improved.  Sinus tachycardia: Likely due to noncompliance with her medication we will start her on metoprolol reevaluate in the morning.  Essential hypertension She relates her propanolol was discontinued as an outpatient we will start her on metoprolol which she relates she was started on as an outpatient. Discontinue propanolol.  Diabetes mellitus type 2: Continue to hold metformin, continue sliding scale plus long-acting insulin. A1C is 6.9.    DVT prophylaxis: lovenox Family Communication:none Status is: Inpatient  Remains inpatient appropriate because:Hemodynamically unstable   Dispo: The patient is from: Home               Anticipated d/c is to: SNF              Patient currently is not medically stable to d/c.   Difficult to place patient No  Code Status:     Code Status Orders  (From admission, onward)         Start     Ordered   05/04/20 1730  Full code  Continuous        05/04/20 1729        Code Status History    Date Active Date Inactive Code Status Order ID Comments User Context   07/26/2017 2122 07/31/2017 1824 Full Code [id number]  Onnie BoerEmokpae, Ejiroghene E, MD Inpatient   03/01/2017 1244 03/01/2017 1821 Full Code [id number]  Yvonne KendallEnd, Christopher, MD Inpatient   Advance Care Planning Activity    Advance Directive Documentation   Flowsheet Row Most Recent Value  Type of Advance Directive Healthcare Power of Attorney, Living will  Pre-existing out of facility DNR order (yellow form or pink MOST form) --  "MOST" Form in Place? --        IV Access:    Peripheral IV   Procedures and diagnostic studies:   DG Chest 2 View  Result Date: 05/04/2020 CLINICAL DATA:  75 year old female with shortness of breath, cough and congestion. Recent pneumonia. Former smoker. EXAM: CHEST - 2 VIEW COMPARISON:  Portable chest 05/31/2020 and earlier. FINDINGS: Improved lung volumes with somewhat large chronic lung volumes again noted. Mediastinal contours remain normal. Visualized tracheal air column is within normal limits. Chronic increased interstitial markings in both lungs currently  with no pneumothorax, pulmonary edema, pleural effusion or confluent pulmonary opacity. No acute osseous abnormality identified. Stable cholecystectomy clips. Negative visible bowel gas pattern. Abdominal Calcified aortic atherosclerosis. IMPRESSION: Chronic lung disease with no acute cardiopulmonary abnormality. Electronically Signed   By: Odessa Fleming M.D.   On: 05/04/2020 08:11   CT Angio Chest PE W/Cm &/Or Wo Cm  Result Date: 05/04/2020 CLINICAL DATA:  Shortness of breath. EXAM: CT ANGIOGRAPHY CHEST WITH CONTRAST TECHNIQUE:  Multidetector CT imaging of the chest was performed using the standard protocol during bolus administration of intravenous contrast. Multiplanar CT image reconstructions and MIPs were obtained to evaluate the vascular anatomy. CONTRAST:  64mL OMNIPAQUE IOHEXOL 350 MG/ML SOLN COMPARISON:  February 25, 2020. FINDINGS: Cardiovascular: Satisfactory opacification of the pulmonary arteries to the segmental level. No evidence of pulmonary embolism. Normal heart size. No pericardial effusion. Atherosclerosis of thoracic aorta is noted without aneurysm or dissection. Mediastinum/Nodes: No enlarged mediastinal, hilar, or axillary lymph nodes. Thyroid gland, trachea, and esophagus demonstrate no significant findings. Lungs/Pleura: No pneumothorax or pleural effusion is noted. Mildly increased reticular and nodular densities are noted in the left upper lobe concerning for atypical infection. Upper Abdomen: No acute abnormality. Musculoskeletal: No chest wall abnormality. No acute or significant osseous findings. Review of the MIP images confirms the above findings. IMPRESSION: No definite evidence of pulmonary embolus. Mildly increased reticular and nodular densities are noted in the left upper lobe concerning for atypical inflammation or infection, such as mycobacterium. Aortic Atherosclerosis (ICD10-I70.0). Electronically Signed   By: Lupita Raider M.D.   On: 05/04/2020 14:38     Medical Consultants:    None.   Subjective:    [name] Pebbles relates her breathing is better and has no new complaints.  Objective:    Vitals:   05/05/20 0013 05/05/20 0425 05/05/20 0738 05/05/20 0827  BP: 107/68 122/73  (!) 133/98  Pulse: 92 (!) 102  (!) 108  Resp: Temp: 98.2 F (36.8 C) 98 F (36.7 C)  98 F (36.7 C)  TempSrc: Oral Oral  Oral  SpO2: 100% 100% 98% 96%   SpO2: 96 % O2 Flow Rate (L/min): 3 L/min   Intake/Output Summary (Last 24 hours) at 05/05/2020 1000 Last data filed at 05/05/2020  0908 Gross per 24 hour  Intake 1273.19 ml  Output --  Net 1273.19 ml   There were no vitals filed for this visit.  Exam: General exam: In no acute distress. Respiratory system: Good air movement and clear to auscultation no wheezing. Cardiovascular system: S1 & S2 heard, RRR. No JVD. Gastrointestinal system: Abdomen is nondistended, soft and nontender.  Extremities: No pedal edema. Skin: No rashes, lesions or ulcers  Data Reviewed:    Labs: Basic Metabolic Panel: Recent Labs  Lab 05/01/20 1503 05/04/20 0811 05/05/20 0456  NA 138 140 142  K 4.3 3.6 4.9  CL 96* 97* 102  CO2 32 31 32  GLUCOSE 187* 151* 162*  BUN CREATININE 0.85 1.04* 0.80  CALCIUM 10.2 9.9 9.3   GFR Estimated Creatinine Clearance: 50.5 mL/min (by C-G formula based on SCr of 0.8 mg/dL). Liver Function Tests: Recent Labs  Lab 05/01/20 1503 05/04/20 0811  AST 11* 19  ALT 11 19  ALKPHOS 71 61  BILITOT 0.6 0.8  PROT 7.4 8.2*  ALBUMIN 3.9 3.8   No results for input(s): LIPASE, AMYLASE in the last 168 hours. No results for input(s): AMMONIA in the last 168 hours. Coagulation  profile Recent Labs  Lab 05/04/20 0811  INR 1.0   COVID-19 Labs  No results for input(s): DDIMER, FERRITIN, LDH, CRP in the last 72 hours.  Lab Results  Component Value Date   SARSCOV2NAA NEGATIVE 05/04/2020   SARSCOV2NAA NEGATIVE 05/01/2020   SARSCOV2NAA Not Detected 12/31/2018    CBC: Recent Labs  Lab 05/01/20 1503 05/04/20 0811  WBC 17.7* 8.9  NEUTROABS 14.2* 6.6  HGB 13.3 14.3  HCT 40.4 44.3  MCV 94.4 95.3  PLT 334 380   Cardiac Enzymes: No results for input(s): CKTOTAL, CKMB, CKMBINDEX, TROPONINI in the last 168 hours. BNP (last 3 results) Recent Labs    02/24/20 1545  PROBNP 193   CBG: Recent Labs  Lab 05/04/20 2031 05/05/20 0752  GLUCAP 194* 143*   D-Dimer: No results for input(s): DDIMER in the last 72 hours. Hgb A1c: Recent Labs    05/05/20 0456  HGBA1C 6.9*   Lipid  Profile: No results for input(s): CHOL, HDL, LDLCALC, TRIG, CHOLHDL, LDLDIRECT in the last 72 hours. Thyroid function studies: No results for input(s): TSH, T4TOTAL, T3FREE, THYROIDAB in the last 72 hours.  Invalid input(s): FREET3 Anemia work up: No results for input(s): VITAMINB12, FOLATE, FERRITIN, TIBC, IRON, RETICCTPCT in the last 72 hours. Sepsis Labs: Recent Labs  Lab 05/01/20 1503 05/04/20 0811 05/04/20 1034  WBC 17.7* 8.9  --   LATICACIDVEN  --  1.5 0.9   Microbiology Recent Results (from the past 240 hour(s))  Resp Panel by RT-PCR (Flu A&B, Covid) Nasopharyngeal Swab     Status: None   Collection Time: 05/01/20  3:03 PM   Specimen: Nasopharyngeal Swab; Nasopharyngeal(NP) swabs in vial transport medium  Result Value Ref Range Status   SARS Coronavirus 2 by RT PCR NEGATIVE NEGATIVE Final    Comment: (NOTE) SARS-CoV-2 target nucleic acids are NOT DETECTED.  The SARS-CoV-2 RNA is generally detectable in upper respiratory specimens during the acute phase of infection. The lowest concentration of SARS-CoV-2 viral copies this assay can detect is 138 copies/mL. A negative result does not preclude SARS-Cov-2 infection and should not be used as the sole basis for treatment or other patient management decisions. A negative result may occur with  improper specimen collection/handling, submission of specimen other than nasopharyngeal swab, presence of viral mutation(s) within the areas targeted by this assay, and inadequate number of viral copies(<138 copies/mL). A negative result must be combined with clinical observations, patient history, and epidemiological information. The expected result is Negative.  Fact Sheet for Patients:  BloggerCourse.com  Fact Sheet for Healthcare Providers:  SeriousBroker.it  This test is no t yet approved or cleared by the [place] FDA and  has been authorized for detection and/or  diagnosis of SARS-CoV-2 by FDA under an Emergency Use Authorization (EUA). This EUA will remain  in effect (meaning this test can be used) for the duration of the COVID-19 declaration under Section 564(b)(1) of the Act, 21 U.S.C.section 360bbb-3(b)(1), unless the authorization is terminated  or revoked sooner.       Influenza A by PCR NEGATIVE NEGATIVE Final   Influenza B by PCR NEGATIVE NEGATIVE Final    Comment: (NOTE) The Xpert Xpress SARS-CoV-2/FLU/RSV plus assay is intended as an aid in the diagnosis of influenza from Nasopharyngeal swab specimens and should not be used as a sole basis for treatment. Nasal washings and aspirates are unacceptable for Xpert Xpress SARS-CoV-2/FLU/RSV testing.  Fact Sheet for Patients: BloggerCourse.com  Fact Sheet for Healthcare Providers: SeriousBroker.it  This test is not  yet approved or cleared by the [place] and has been authorized for detection and/or diagnosis of SARS-CoV-2 by FDA under an Emergency Use Authorization (EUA). This EUA will remain in effect (meaning this test can be used) for the duration of the COVID-19 declaration under Section 564(b)(1) of the Act, 21 U.S.C. section 360bbb-3(b)(1), unless the authorization is terminated or revoked.  Performed at [facility]   Culture, blood (routine x 2)     Status: None (Preliminary result)   Collection Time: 05/04/20  8:11 AM   Specimen: BLOOD  Result Value Ref Range Status   Specimen Description   Final    BLOOD LEFT ANTECUBITAL Performed at [facility], [address] [place], [place] [address]    Special Requests   Final    BOTTLES DRAWN AEROBIC AND ANAEROBIC Blood Culture adequate volume Performed at [facility], [address] [place], [place] [address]    Culture   Final    NO GROWTH < 24 HOURS Performed at [facility], [address]  [place], [place] [address]    Report Status PENDING  Incomplete  Culture, blood (routine x 2)     Status: None (Preliminary result)   Collection Time: 05/04/20  8:11 AM   Specimen: BLOOD  Result Value Ref Range Status   Specimen Description   Final    BLOOD RIGHT ANTECUBITAL Performed at [facility], [address], [place], [place] [address]    Special Requests   Final    BOTTLES DRAWN AEROBIC AND ANAEROBIC Blood Culture adequate volume Performed at [facility], [address], [place], [place] [address]    Culture   Final    NO GROWTH < 24 HOURS Performed at [facility], [address], [place], [place] [address]    Report Status PENDING  Incomplete  Resp Panel by RT-PCR (Flu A&B, Covid) Nasopharyngeal Swab     Status: None   Collection Time: 05/04/20 12:28 PM   Specimen: Nasopharyngeal Swab; Nasopharyngeal(NP) swabs in vial transport medium  Result Value Ref Range Status   SARS Coronavirus 2 by RT PCR NEGATIVE NEGATIVE Final    Comment: (NOTE) SARS-CoV-2 target nucleic acids are NOT DETECTED.  The SARS-CoV-2 RNA is generally detectable in upper respiratory specimens during the acute phase of infection. The lowest concentration of SARS-CoV-2 viral copies this assay can detect is 138 copies/mL. A negative result does not preclude SARS-Cov-2 infection and should not be used as the sole basis for treatment or other patient management decisions. A negative result may occur with  improper specimen collection/handling, submission of specimen other than nasopharyngeal swab, presence of viral mutation(s) within the areas targeted by this assay, and inadequate number of viral copies(<138 copies/mL). A negative result must be combined with clinical observations, patient history, and epidemiological information. The expected result is Negative.  Fact Sheet for Patients:  BloggerCourse.com  Fact Sheet for Healthcare  Providers:  SeriousBroker.it  This test is no t yet approved or cleared by the [place] FDA and  has been authorized for detection and/or diagnosis of SARS-CoV-2 by FDA under an Emergency Use Authorization (EUA). This EUA will remain  in effect (meaning this test can be used) for the duration of the COVID-19 declaration under Section 564(b)(1) of the Act, 21 U.S.C.section 360bbb-3(b)(1), unless the authorization is terminated  or revoked sooner.       Influenza A by PCR NEGATIVE NEGATIVE Final  Influenza B by PCR NEGATIVE NEGATIVE Final    Comment: (NOTE) The Xpert Xpress SARS-CoV-2/FLU/RSV plus assay is intended as an aid in the diagnosis of influenza from Nasopharyngeal swab specimens and should not be used as a sole basis for treatment. Nasal washings and aspirates are unacceptable for Xpert Xpress SARS-CoV-2/FLU/RSV testing.  Fact Sheet for Patients: BloggerCourse.com  Fact Sheet for Healthcare Providers: SeriousBroker.it  This test is not yet approved or cleared by the [place] FDA and has been authorized for detection and/or diagnosis of SARS-CoV-2 by FDA under an Emergency Use Authorization (EUA). This EUA will remain in effect (meaning this test can be used) for the duration of the COVID-19 declaration under Section 564(b)(1) of the Act, 21 U.S.C. section 360bbb-3(b)(1), unless the authorization is terminated or revoked.  Performed at [facility], [address] [place], [place] [address]      Medications:   . albuterol  2.5 mg Inhalation TID  . atorvastatin  20 mg Oral Daily  . azithromycin  500 mg Oral q1800  . enoxaparin (LOVENOX) injection  40 mg Subcutaneous Q24H  . fluticasone  2 spray Each Nare Daily  . fluticasone furoate-vilanterol  1 puff Inhalation Daily   And  . umeclidinium bromide  1 puff Inhalation Daily  . insulin aspart  0-5 Units  Subcutaneous QHS  . insulin aspart  0-9 Units Subcutaneous TID WC  . insulin aspart  3 Units Subcutaneous TID WC  . insulin detemir  5 Units Subcutaneous BID  . levothyroxine  88 mcg Oral Daily  . losartan  25 mg Oral Daily  . mouth rinse  15 mL Mouth Rinse BID  . methylPREDNISolone (SOLU-MEDROL) injection  40 mg Intravenous Q12H  . traZODone  50 mg Oral QHS  . venlafaxine XR  150 mg Oral Daily   Continuous Infusions: . sodium chloride 75 mL/hr at 05/05/20 0705  . cefTRIAXone (ROCEPHIN)  IV Stopped (05/04/20 1958)      LOS: 1 day   Marinda Elk  [facility]  05/05/2020, 10:00 AM

## 2020-05-06 DIAGNOSIS — J9621 Acute and chronic respiratory failure with hypoxia: Secondary | ICD-10-CM | POA: Diagnosis not present

## 2020-05-06 DIAGNOSIS — J441 Chronic obstructive pulmonary disease with (acute) exacerbation: Secondary | ICD-10-CM | POA: Diagnosis not present

## 2020-05-06 DIAGNOSIS — I10 Essential (primary) hypertension: Secondary | ICD-10-CM | POA: Diagnosis not present

## 2020-05-06 DIAGNOSIS — J189 Pneumonia, unspecified organism: Secondary | ICD-10-CM | POA: Diagnosis not present

## 2020-05-06 LAB — GLUCOSE, CAPILLARY: Glucose-Capillary: 103 mg/dL — ABNORMAL HIGH (ref 70–99)

## 2020-05-06 MED ORDER — PREDNISONE 10 MG PO TABS: ORAL_TABLET | ORAL | 0 refills | Status: DC

## 2020-05-06 MED ORDER — CEFPODOXIME PROXETIL 200 MG PO TABS: 200.0000 mg | ORAL_TABLET | Freq: Two times a day (BID) | ORAL | 0 refills | Status: AC

## 2020-05-06 MED ORDER — AZITHROMYCIN 250 MG PO TABS: ORAL_TABLET | ORAL | 0 refills | Status: DC

## 2020-05-06 MED ORDER — METOPROLOL TARTRATE 50 MG PO TABS: 50.0000 mg | ORAL_TABLET | Freq: Two times a day (BID) | ORAL | 0 refills | Status: DC

## 2020-05-06 MED ORDER — METOPROLOL TARTRATE 50 MG PO TABS
50.0000 mg | ORAL_TABLET | Freq: Two times a day (BID) | ORAL | Status: DC
  Administered 2020-05-06: 50 mg via ORAL
  Filled 2020-05-06: qty 1

## 2020-05-06 NOTE — Progress Notes (Signed)
Provided discharge instructions to patient. Patient verbalized understanding.

## 2020-05-06 NOTE — Evaluation (Signed)
Physical Therapy Evaluation Patient Details Name: [name] [name] MRN: [id number] DOB: 08-15-1945 Today's Date: 05/06/2020   History of Present Illness  75 y.o. female  seen by her primary care doctor for shortness of breath was started on Doxy and Vantin which she failed as an outpatient came to into the ED complaining of cough and shortness of breath not improved on antibiotics. Dx of PNA. Pt with past medical history significant for COPD, chronic cough as per Dr. Delton Coombes notes on 04/13/2020 she is on 2 L of oxygen chronically for chronic respiratory failure due to hypoxemia, essential hypertension.  Clinical Impression  Pt ambulated 180' without an assistive device, no loss of balance, SaO2 93-95% on 2L O2 while walking and talking, no dyspnea. Pt is ready to DC home from PT standpoint. She has home O2 which she uses prn.     Follow Up Recommendations No PT follow up    Equipment Recommendations  None recommended by PT    Recommendations for Other Services       Precautions / Restrictions Precautions Precautions: Fall Precaution Comments: denies h/o falls in past 6 months, on 3L home O2 prn Restrictions Weight Bearing Restrictions: No      Mobility  Bed Mobility               General bed mobility comments: up in recliner    Transfers Overall transfer level: Independent Equipment used: None                Ambulation/Gait Ambulation/Gait assistance: Independent Gait Distance (Feet): 180 Feet Assistive device: None Gait Pattern/deviations: WFL(Within Functional Limits) Gait velocity: WFL   General Gait Details: steady with RW, no loss of balance, SaO2 93-95% on 2L O2 while walking and talking, no dyspnea  Stairs            Wheelchair Mobility    Modified Rankin (Stroke Patients Only)       Balance Overall balance assessment: Independent                                           Pertinent Vitals/Pain Pain Assessment:  No/denies pain    Home Living Family/patient expects to be discharged to:: Private residence Living Arrangements: Alone Available Help at Discharge: Family;Available PRN/intermittently Type of Home: Apartment Home Access: Level entry     Home Layout: One level Home Equipment: Cane - single point;Shower seat      Prior Function Level of Independence: Independent               Hand Dominance        Extremity/Trunk Assessment   Upper Extremity Assessment Upper Extremity Assessment: Overall WFL for tasks assessed;RUE deficits/detail (pt reports she's starting PT next week for R shoulder issues) RUE Deficits / Details: pt reports she's starting PT next week for R shoulder issues. Pt able to reach behing head, elevate R shoulder to ~70* AROM, no fine motor deficits R hand.    Lower Extremity Assessment Lower Extremity Assessment: Overall WFL for tasks assessed       Communication   Communication: No difficulties  Cognition Arousal/Alertness: Awake/alert Behavior During Therapy: WFL for tasks assessed/performed Overall Cognitive Status: Within Functional Limits for tasks assessed  General Comments      Exercises     Assessment/Plan    PT Assessment Patent does not need any further PT services  PT Problem List         PT Treatment Interventions      PT Goals (Current goals can be found in the Care Plan section)  Acute Rehab PT Goals PT Goal Formulation: All assessment and education complete, DC therapy    Frequency     Barriers to discharge        Co-evaluation               AM-PAC PT "6 Clicks" Mobility  Outcome Measure Help needed turning from your back to your side while in a flat bed without using bedrails?: None Help needed moving from lying on your back to sitting on the side of a flat bed without using bedrails?: None Help needed moving to and from a bed to a chair (including a  wheelchair)?: None Help needed standing up from a chair using your arms (e.g., wheelchair or bedside chair)?: None Help needed to walk in hospital room?: None Help needed climbing 3-5 steps with a railing? : None 6 Click Score: 24    End of Session Equipment Utilized During Treatment: Gait belt;Oxygen Activity Tolerance: Patient tolerated treatment well Patient left: in chair;with call bell/phone within reach Nurse Communication: Mobility status      Time: 0321-2248 PT Time Calculation (min) (ACUTE ONLY): 14 min   Charges:   PT Evaluation $PT Eval Low Complexity: 1 Low         Ralene Bathe Kistler PT 05/06/2020  Acute Rehabilitation Services Pager [phone] Office [phone]

## 2020-05-06 NOTE — Discharge Summary (Addendum)
Physician Discharge Summary  [name] [name] ALP:[id number] DOB: 1945-01-29 DOA: 05/04/2020  PCP: Shirlean Mylar, MD  Admit date: 05/04/2020 Discharge date: 05/06/2020  Admitted From: Home Disposition:  Home  Recommendations for Outpatient Follow-up:  Follow up with PCP in 1-2 weeks Please obtain BMP/CBC in one week   Home Health:No Equipment/Devices:None  Discharge Condition:Stable CODE STATUS:Full Diet recommendation: Heart Healthy  Brief/Interim Summary: 75 y.o. female past medical history significant for COPD, chronic cough as per Dr. Delton Coombes notes on 04/13/2020 she is on 2 L of oxygen chronically for chronic respiratory failure due to hypoxemia, essential hypertension seen by her primary care doctor for shortness of breath was started on Doxy and Vantin which she failed as an outpatient came to into the ED complaining of cough and shortness of breath not improved on antibiotics.  Discharge Diagnoses:  Active Problems:   Essential hypertension   COPD (chronic obstructive pulmonary disease) (HCC)   Non-insulin treated type 2 diabetes mellitus (HCC)   Atypical pneumonia   Acute on chronic respiratory failure with hypoxia (HCC)  Acute on chronic respiratory failure with hypoxia in the setting of atypical pneumonia and acute COPD exacerbation: She started empirically on antibiotics on admission a CT angio of the chest was done that was negative, but it did show groundglass opacity in the right upper lung. She has been treated with antibiotics as an outpatient which she failed. She was started empirically on Rocephin and azithromycin and steroids for her COPD exacerbation, she defervesced she was only requiring 1 L of oxygen to keep saturations greater 92% she will continue antibiotic regimen as an outpatient as well as steroid taper.  Acute kidney injury: Likely prerenal azotemia resolved with IV fluids.  Sinus tachycardia: Likely due to noncompliance with her medication her  propanolol was discontinued she was started on metoprolol and her heart rate improved.  Essential hypertension: Propanolol was discontinued she was started on metoprolol she will follow-up with her PCP as an outpatient and titrate antihypertensive medications as tolerated.  Diabetes mellitus type 2, Controlled: Her metformin was held, her A1c was 6.9 she was started on long-acting insulin plus sliding scale.  She was counseled about diet and lifestyle modifications. She will resume her metformin as an outpatient no changes made to her medication.  Discharge Instructions  Discharge Instructions     Diet - low sodium heart healthy   Complete by: As directed    Increase activity slowly   Complete by: As directed       Allergies as of 05/06/2020       Reactions   Lisinopril    cough   Sulfonamide Derivatives Rash        Medication List     STOP taking these medications    amoxicillin-clavulanate 875-125 MG tablet Commonly known as: Augmentin   doxycycline 100 MG capsule Commonly known as: VIBRAMYCIN       TAKE these medications    albuterol (2.5 MG/3ML) 0.083% nebulizer solution Commonly known as: PROVENTIL Take 3 mLs (2.5 mg total) by nebulization every 4 (four) hours as needed for wheezing or shortness of breath (DX: COPD 496). What changed: Another medication with the same name was changed. Make sure you understand how and when to take each.   Proventil HFA 108 (90 Base) MCG/ACT inhaler Generic drug: albuterol INHALE 2 PUFFS BY MOUTH INTO LUNGS EVERY SIX HOURS AS NEEDED FOR WHEEZING OR SHORTNESS OF BREATH What changed:  how much to take how to take this when to take  this reasons to take this additional instructions   atorvastatin 20 MG tablet Commonly known as: LIPITOR Take 20 mg by mouth daily.   azithromycin 250 MG tablet Commonly known as: ZITHROMAX 500mg  (two tablets) today, then 250mg  (1 tablet) for the next 4 days   Breztri Aerosphere 160-9-4.8  MCG/ACT Aero Generic drug: Budeson-Glycopyrrol-Formoterol Inhale 2 puffs into the lungs in the morning and at bedtime.   cefpodoxime 200 MG tablet Commonly known as: VANTIN Take 1 tablet (200 mg total) by mouth 2 (two) times daily for 7 days.   CENTRUM PO Take 1 tablet by mouth daily.   esomeprazole 40 MG capsule Commonly known as: NEXIUM TAKE 1 CAPSULE (40 MG TOTAL) BY MOUTH 2 (TWO) TIMES DAILY BEFORE A MEAL.   ferrous sulfate 325 (65 FE) MG tablet Take 325 mg by mouth daily.   fexofenadine 180 MG tablet Commonly known as: ALLEGRA Take 180 mg by mouth daily.   fluticasone 50 MCG/ACT nasal spray Commonly known as: FLONASE SPRAY 2 SPRAYS INTO EACH NOSTRIL EVERY DAY What changed: See the new instructions.   levothyroxine 88 MCG tablet Commonly known as: SYNTHROID Take 88 mcg by mouth daily.   losartan 25 MG tablet Commonly known as: COZAAR Take 25 mg by mouth daily.   metFORMIN 500 MG 24 hr tablet Commonly known as: GLUCOPHAGE-XR Take 500 mg by mouth every evening.   metoprolol tartrate 50 MG tablet Commonly known as: LOPRESSOR Take 1 tablet (50 mg total) by mouth 2 (two) times daily.   nitroGLYCERIN 0.4 MG SL tablet Commonly known as: Nitrostat Place 1 tablet (0.4 mg total) under the tongue every 5 (five) minutes as needed for chest pain.   omeprazole 20 MG capsule Commonly known as: PRILOSEC TAKE 1 CAPSULE (20 MG TOTAL) BY MOUTH 2 (TWO) TIMES DAILY BEFORE A MEAL.   ONE TOUCH ULTRA TEST test strip Generic drug: glucose blood As directed   predniSONE 10 MG tablet Commonly known as: DELTASONE Takes  5 tablets for 1 days, then 4 tablets for 1 days, then 3 tablets for 1 days, then 2 tabs for 1 days, then 1 tab for 1 days, and then stop. What changed: additional instructions   predniSONE 10 MG tablet Commonly known as: DELTASONE Takes 5 tablets for 1 days, then 4 tablets for 1 days, then 3 tablets for 1 days, then 2 tabs for 1 days, then 1 tab for 1 days, and  then stop. What changed: You were already taking a medication with the same name, and this prescription was added. Make sure you understand how and when to take each.   propranolol 20 MG tablet Commonly known as: INDERAL Take 20 mg by mouth daily.   Tiotropium Bromide-Olodaterol 2.5-2.5 MCG/ACT Aers Commonly known as: Stiolto Respimat Inhale 2 puffs into the lungs daily.   traMADol 50 MG tablet Commonly known as: ULTRAM Take 50 mg by mouth 3 (three) times daily as needed for pain.   traZODone 50 MG tablet Commonly known as: DESYREL Take 50 mg by mouth at bedtime.   venlafaxine XR 150 MG 24 hr capsule Commonly known as: EFFEXOR-XR Take 150 mg by mouth daily.   vitamin C 1000 MG tablet Take 1,000 mg by mouth daily.         Allergies  Allergen Reactions   Lisinopril     cough   Sulfonamide Derivatives Rash    Consultations: None   Procedures/Studies: DG Chest 2 View  Result Date: 05/04/2020 CLINICAL DATA:  75 year old female with  shortness of breath, cough and congestion. Recent pneumonia. Former smoker. EXAM: CHEST - 2 VIEW COMPARISON:  Portable chest 05/31/2020 and earlier. FINDINGS: Improved lung volumes with somewhat large chronic lung volumes again noted. Mediastinal contours remain normal. Visualized tracheal air column is within normal limits. Chronic increased interstitial markings in both lungs currently with no pneumothorax, pulmonary edema, pleural effusion or confluent pulmonary opacity. No acute osseous abnormality identified. Stable cholecystectomy clips. Negative visible bowel gas pattern. Abdominal Calcified aortic atherosclerosis. IMPRESSION: Chronic lung disease with no acute cardiopulmonary abnormality. Electronically Signed   By: Odessa FlemingH  Hall M.D.   On: 05/04/2020 08:11   CT Angio Chest PE W/Cm &/Or Wo Cm  Result Date: 05/04/2020 CLINICAL DATA:  Shortness of breath. EXAM: CT ANGIOGRAPHY CHEST WITH CONTRAST TECHNIQUE: Multidetector CT imaging of the chest  was performed using the standard protocol during bolus administration of intravenous contrast. Multiplanar CT image reconstructions and MIPs were obtained to evaluate the vascular anatomy. CONTRAST:  64mL OMNIPAQUE IOHEXOL 350 MG/ML SOLN COMPARISON:  February 25, 2020. FINDINGS: Cardiovascular: Satisfactory opacification of the pulmonary arteries to the segmental level. No evidence of pulmonary embolism. Normal heart size. No pericardial effusion. Atherosclerosis of thoracic aorta is noted without aneurysm or dissection. Mediastinum/Nodes: No enlarged mediastinal, hilar, or axillary lymph nodes. Thyroid gland, trachea, and esophagus demonstrate no significant findings. Lungs/Pleura: No pneumothorax or pleural effusion is noted. Mildly increased reticular and nodular densities are noted in the left upper lobe concerning for atypical infection. Upper Abdomen: No acute abnormality. Musculoskeletal: No chest wall abnormality. No acute or significant osseous findings. Review of the MIP images confirms the above findings. IMPRESSION: No definite evidence of pulmonary embolus. Mildly increased reticular and nodular densities are noted in the left upper lobe concerning for atypical inflammation or infection, such as mycobacterium. Aortic Atherosclerosis (ICD10-I70.0). Electronically Signed   By: Lupita RaiderJames  Green Jr M.D.   On: 05/04/2020 14:38   DG Chest Portable 1 View  Result Date: 05/01/2020 CLINICAL DATA:  Shortness of breath. EXAM: PORTABLE CHEST 1 VIEW COMPARISON:  February 24, 2020 FINDINGS: Bibasilar opacities are identified. The heart, hila, mediastinum, lungs, and pleura are otherwise unremarkable. IMPRESSION: Bibasilar infiltrates worrisome for pneumonia or aspiration. Recommend short-term follow-up imaging to ensure resolution. Electronically Signed   By: Gerome Samavid  Williams III M.D   On: 05/01/2020 15:33    Subjective: No complains  Discharge Exam: Vitals:   05/06/20 0751 05/06/20 1059  BP:    Pulse:     Resp:    Temp:    SpO2: 99% 93%   Vitals:   05/05/20 2131 05/06/20 0529 05/06/20 0751 05/06/20 1059  BP: 119/63 109/64    Pulse: (!) 106 (!) 104    Resp:  20    Temp:  98.4 F (36.9 C)    TempSrc:  Oral    SpO2: 99% 98% 99% 93%    General: Pt is alert, awake, not in acute distress Cardiovascular: RRR, S1/S2 +, no rubs, no gallops Respiratory: CTA bilaterally, no wheezing, no rhonchi Abdominal: Soft, NT, ND, bowel sounds + Extremities: no edema, no cyanosis    The results of significant diagnostics from this hospitalization (including imaging, microbiology, ancillary and laboratory) are listed below for reference.     Microbiology: Recent Results (from the past 240 hour(s))  Resp Panel by RT-PCR (Flu A&B, Covid) Nasopharyngeal Swab     Status: None   Collection Time: 05/01/20  3:03 PM   Specimen: Nasopharyngeal Swab; Nasopharyngeal(NP) swabs in vial transport medium  Result Value Ref  Range Status   SARS Coronavirus 2 by RT PCR NEGATIVE NEGATIVE Final    Comment: (NOTE) SARS-CoV-2 target nucleic acids are NOT DETECTED.  The SARS-CoV-2 RNA is generally detectable in upper respiratory specimens during the acute phase of infection. The lowest concentration of SARS-CoV-2 viral copies this assay can detect is 138 copies/mL. A negative result does not preclude SARS-Cov-2 infection and should not be used as the sole basis for treatment or other patient management decisions. A negative result may occur with  improper specimen collection/handling, submission of specimen other than nasopharyngeal swab, presence of viral mutation(s) within the areas targeted by this assay, and inadequate number of viral copies(<138 copies/mL). A negative result must be combined with clinical observations, patient history, and epidemiological information. The expected result is Negative.  Fact Sheet for Patients:  BloggerCourse.com  Fact Sheet for Healthcare Providers:   SeriousBroker.it  This test is no t yet approved or cleared by the [place] FDA and  has been authorized for detection and/or diagnosis of SARS-CoV-2 by FDA under an Emergency Use Authorization (EUA). This EUA will remain  in effect (meaning this test can be used) for the duration of the COVID-19 declaration under Section 564(b)(1) of the Act, 21 U.S.C.section 360bbb-3(b)(1), unless the authorization is terminated  or revoked sooner.       Influenza A by PCR NEGATIVE NEGATIVE Final   Influenza B by PCR NEGATIVE NEGATIVE Final    Comment: (NOTE) The Xpert Xpress SARS-CoV-2/FLU/RSV plus assay is intended as an aid in the diagnosis of influenza from Nasopharyngeal swab specimens and should not be used as a sole basis for treatment. Nasal washings and aspirates are unacceptable for Xpert Xpress SARS-CoV-2/FLU/RSV testing.  Fact Sheet for Patients: BloggerCourse.com  Fact Sheet for Healthcare Providers: SeriousBroker.it  This test is not yet approved or cleared by the [place] FDA and has been authorized for detection and/or diagnosis of SARS-CoV-2 by FDA under an Emergency Use Authorization (EUA). This EUA will remain in effect (meaning this test can be used) for the duration of the COVID-19 declaration under Section 564(b)(1) of the Act, 21 U.S.C. section 360bbb-3(b)(1), unless the authorization is terminated or revoked.  Performed at [facility]   Culture, blood (routine x 2)     Status: None (Preliminary result)   Collection Time: 05/04/20  8:11 AM   Specimen: BLOOD  Result Value Ref Range Status   Specimen Description   Final    BLOOD LEFT ANTECUBITAL Performed at [facility], [address], [place], [place] [address]    Special Requests   Final    BOTTLES DRAWN AEROBIC AND ANAEROBIC Blood Culture adequate volume Performed at [facility], [address] [place], [place] [address]    Culture   Final    NO GROWTH 2 DAYS Performed at [facility], [address], [place], [place] [address]    Report Status PENDING  Incomplete  Culture, blood (routine x 2)     Status: None (Preliminary result)   Collection Time: 05/04/20  8:11 AM   Specimen: BLOOD  Result Value Ref Range Status   Specimen Description   Final    BLOOD RIGHT ANTECUBITAL Performed at [facility] [address] [place], [place] [address]    Special Requests   Final    BOTTLES DRAWN AEROBIC AND ANAEROBIC Blood Culture adequate volume Performed at [facility], [address], [place], [place] [address]  Culture   Final    NO GROWTH 2 DAYS Performed at [facility], [address] [place], [place] [address]    Report Status PENDING  Incomplete  Resp Panel by RT-PCR (Flu A&B, Covid) Nasopharyngeal Swab     Status: None   Collection Time: 05/04/20 12:28 PM   Specimen: Nasopharyngeal Swab; Nasopharyngeal(NP) swabs in vial transport medium  Result Value Ref Range Status   SARS Coronavirus 2 by RT PCR NEGATIVE NEGATIVE Final    Comment: (NOTE) SARS-CoV-2 target nucleic acids are NOT DETECTED.  The SARS-CoV-2 RNA is generally detectable in upper respiratory specimens during the acute phase of infection. The lowest concentration of SARS-CoV-2 viral copies this assay can detect is 138 copies/mL. A negative result does not preclude SARS-Cov-2 infection and should not be used as the sole basis for treatment or other patient management decisions. A negative result may occur with  improper specimen collection/handling, submission of specimen other than nasopharyngeal swab, presence of viral mutation(s) within the areas targeted by this assay, and inadequate number of viral copies(<138 copies/mL). A negative result must be combined with clinical observations, patient history, and  epidemiological information. The expected result is Negative.  Fact Sheet for Patients:  BloggerCourse.com  Fact Sheet for Healthcare Providers:  SeriousBroker.it  This test is no t yet approved or cleared by the [place] FDA and  has been authorized for detection and/or diagnosis of SARS-CoV-2 by FDA under an Emergency Use Authorization (EUA). This EUA will remain  in effect (meaning this test can be used) for the duration of the COVID-19 declaration under Section 564(b)(1) of the Act, 21 U.S.C.section 360bbb-3(b)(1), unless the authorization is terminated  or revoked sooner.       Influenza A by PCR NEGATIVE NEGATIVE Final   Influenza B by PCR NEGATIVE NEGATIVE Final    Comment: (NOTE) The Xpert Xpress SARS-CoV-2/FLU/RSV plus assay is intended as an aid in the diagnosis of influenza from Nasopharyngeal swab specimens and should not be used as a sole basis for treatment. Nasal washings and aspirates are unacceptable for Xpert Xpress SARS-CoV-2/FLU/RSV testing.  Fact Sheet for Patients: BloggerCourse.com  Fact Sheet for Healthcare Providers: SeriousBroker.it  This test is not yet approved or cleared by the [place] FDA and has been authorized for detection and/or diagnosis of SARS-CoV-2 by FDA under an Emergency Use Authorization (EUA). This EUA will remain in effect (meaning this test can be used) for the duration of the COVID-19 declaration under Section 564(b)(1) of the Act, 21 U.S.C. section 360bbb-3(b)(1), unless the authorization is terminated or revoked.  Performed at [facility], [address], [place], [place] [address]      Labs: BNP (last 3 results) Recent Labs    05/01/20 1503 05/04/20 0811  BNP 141.3* 39.8   Basic Metabolic Panel: Recent Labs  Lab 05/01/20 1503 05/04/20 0811 05/05/20 0456  NA 138 140 142  K 4.3 3.6  4.9  CL 96* 97* 102  CO2 32 31 32  GLUCOSE 187* 151* 162*  BUN 19 22 19   CREATININE 0.85 1.04* 0.80  CALCIUM 10.2 9.9 9.3   Liver Function Tests: Recent Labs  Lab 05/01/20 1503 05/04/20 0811  AST 11* 19  ALT 11 19  ALKPHOS 71 61  BILITOT 0.6 0.8  PROT 7.4 8.2*  ALBUMIN 3.9 3.8   No results for input(s): LIPASE, AMYLASE in the last 168 hours. No results for input(s): AMMONIA in the last 168 hours. CBC: Recent Labs  Lab  05/01/20 1503 05/04/20 0811  WBC 17.7* 8.9  NEUTROABS 14.2* 6.6  HGB 13.3 14.3  HCT 40.4 44.3  MCV 94.4 95.3  PLT 334 380   Cardiac Enzymes: No results for input(s): CKTOTAL, CKMB, CKMBINDEX, TROPONINI in the last 168 hours. BNP: Invalid input(s): POCBNP CBG: Recent Labs  Lab 05/05/20 0752 05/05/20 1122 05/05/20 1654 05/05/20 2009 05/06/20 0743  GLUCAP 143* 183* 141* 153* 103*   D-Dimer No results for input(s): DDIMER in the last 72 hours. Hgb A1c Recent Labs    05/05/20 0456  HGBA1C 6.9*   Lipid Profile No results for input(s): CHOL, HDL, LDLCALC, TRIG, CHOLHDL, LDLDIRECT in the last 72 hours. Thyroid function studies No results for input(s): TSH, T4TOTAL, T3FREE, THYROIDAB in the last 72 hours.  Invalid input(s): FREET3 Anemia work up No results for input(s): VITAMINB12, FOLATE, FERRITIN, TIBC, IRON, RETICCTPCT in the last 72 hours. Urinalysis    Component Value Date/Time   COLORURINE YELLOW 01/01/2018 1340   APPEARANCEUR HAZY (A) 01/01/2018 1340   LABSPEC 1.019 01/01/2018 1340   PHURINE 5.0 01/01/2018 1340   GLUCOSEU NEGATIVE 01/01/2018 1340   HGBUR NEGATIVE 01/01/2018 1340   BILIRUBINUR NEGATIVE 01/01/2018 1340   KETONESUR NEGATIVE 01/01/2018 1340   PROTEINUR 30 (A) 01/01/2018 1340   NITRITE NEGATIVE 01/01/2018 1340   LEUKOCYTESUR MODERATE (A) 01/01/2018 1340   Sepsis Labs Invalid input(s): PROCALCITONIN,  WBC,  LACTICIDVEN Microbiology Recent Results (from the past 240 hour(s))  Resp Panel by RT-PCR (Flu A&B, Covid)  Nasopharyngeal Swab     Status: None   Collection Time: 05/01/20  3:03 PM   Specimen: Nasopharyngeal Swab; Nasopharyngeal(NP) swabs in vial transport medium  Result Value Ref Range Status   SARS Coronavirus 2 by RT PCR NEGATIVE NEGATIVE Final    Comment: (NOTE) SARS-CoV-2 target nucleic acids are NOT DETECTED.  The SARS-CoV-2 RNA is generally detectable in upper respiratory specimens during the acute phase of infection. The lowest concentration of SARS-CoV-2 viral copies this assay can detect is 138 copies/mL. A negative result does not preclude SARS-Cov-2 infection and should not be used as the sole basis for treatment or other patient management decisions. A negative result may occur with  improper specimen collection/handling, submission of specimen other than nasopharyngeal swab, presence of viral mutation(s) within the areas targeted by this assay, and inadequate number of viral copies(<138 copies/mL). A negative result must be combined with clinical observations, patient history, and epidemiological information. The expected result is Negative.  Fact Sheet for Patients:  BloggerCourse.com  Fact Sheet for Healthcare Providers:  SeriousBroker.it  This test is no t yet approved or cleared by the [place] FDA and  has been authorized for detection and/or diagnosis of SARS-CoV-2 by FDA under an Emergency Use Authorization (EUA). This EUA will remain  in effect (meaning this test can be used) for the duration of the COVID-19 declaration under Section 564(b)(1) of the Act, 21 U.S.C.section 360bbb-3(b)(1), unless the authorization is terminated  or revoked sooner.       Influenza A by PCR NEGATIVE NEGATIVE Final   Influenza B by PCR NEGATIVE NEGATIVE Final    Comment: (NOTE) The Xpert Xpress SARS-CoV-2/FLU/RSV plus assay is intended as an aid in the diagnosis of influenza from Nasopharyngeal swab specimens and should not be  used as a sole basis for treatment. Nasal washings and aspirates are unacceptable for Xpert Xpress SARS-CoV-2/FLU/RSV testing.  Fact Sheet for Patients: BloggerCourse.com  Fact Sheet for Healthcare Providers: SeriousBroker.it  This test is not yet approved or  cleared by the [place] and has been authorized for detection and/or diagnosis of SARS-CoV-2 by FDA under an Emergency Use Authorization (EUA). This EUA will remain in effect (meaning this test can be used) for the duration of the COVID-19 declaration under Section 564(b)(1) of the Act, 21 U.S.C. section 360bbb-3(b)(1), unless the authorization is terminated or revoked.  Performed at [facility]   Culture, blood (routine x 2)     Status: None (Preliminary result)   Collection Time: 05/04/20  8:11 AM   Specimen: BLOOD  Result Value Ref Range Status   Specimen Description   Final    BLOOD LEFT ANTECUBITAL Performed at [facility], [address] [place], [place] [address]    Special Requests   Final    BOTTLES DRAWN AEROBIC AND ANAEROBIC Blood Culture adequate volume Performed at [facility], [address] [place], [place] [address]    Culture   Final    NO GROWTH 2 DAYS Performed at [facility], [address] [place], [place] [address]    Report Status PENDING  Incomplete  Culture, blood (routine x 2)     Status: None (Preliminary result)   Collection Time: 05/04/20  8:11 AM   Specimen: BLOOD  Result Value Ref Range Status   Specimen Description   Final    BLOOD RIGHT ANTECUBITAL Performed at [facility], [address] [place], [place] [address]    Special Requests   Final    BOTTLES DRAWN AEROBIC AND ANAEROBIC Blood Culture adequate volume Performed at [facility], [address], [place], [place] [address]    Culture   Final    NO GROWTH 2  DAYS Performed at [facility], [address], [place], [place] [address]    Report Status PENDING  Incomplete  Resp Panel by RT-PCR (Flu A&B, Covid) Nasopharyngeal Swab     Status: None   Collection Time: 05/04/20 12:28 PM   Specimen: Nasopharyngeal Swab; Nasopharyngeal(NP) swabs in vial transport medium  Result Value Ref Range Status   SARS Coronavirus 2 by RT PCR NEGATIVE NEGATIVE Final    Comment: (NOTE) SARS-CoV-2 target nucleic acids are NOT DETECTED.  The SARS-CoV-2 RNA is generally detectable in upper respiratory specimens during the acute phase of infection. The lowest concentration of SARS-CoV-2 viral copies this assay can detect is 138 copies/mL. A negative result does not preclude SARS-Cov-2 infection and should not be used as the sole basis for treatment or other patient management decisions. A negative result may occur with  improper specimen collection/handling, submission of specimen other than nasopharyngeal swab, presence of viral mutation(s) within the areas targeted by this assay, and inadequate number of viral copies(<138 copies/mL). A negative result must be combined with clinical observations, patient history, and epidemiological information. The expected result is Negative.  Fact Sheet for Patients:  BloggerCourse.com  Fact Sheet for Healthcare Providers:  SeriousBroker.it  This test is no t yet approved or cleared by the [place] FDA and  has been authorized for detection and/or diagnosis of SARS-CoV-2 by FDA under an Emergency Use Authorization (EUA). This EUA will remain  in effect (meaning this test can be used) for the duration of the COVID-19 declaration under Section 564(b)(1) of the Act, 21 U.S.C.section 360bbb-3(b)(1), unless the authorization is terminated  or revoked sooner.       Influenza A by PCR NEGATIVE NEGATIVE Final   Influenza B by PCR NEGATIVE  NEGATIVE Final    Comment:  (NOTE) The Xpert Xpress SARS-CoV-2/FLU/RSV plus assay is intended as an aid in the diagnosis of influenza from Nasopharyngeal swab specimens and should not be used as a sole basis for treatment. Nasal washings and aspirates are unacceptable for Xpert Xpress SARS-CoV-2/FLU/RSV testing.  Fact Sheet for Patients: BloggerCourse.com  Fact Sheet for Healthcare Providers: SeriousBroker.it  This test is not yet approved or cleared by the [place] FDA and has been authorized for detection and/or diagnosis of SARS-CoV-2 by FDA under an Emergency Use Authorization (EUA). This EUA will remain in effect (meaning this test can be used) for the duration of the COVID-19 declaration under Section 564(b)(1) of the Act, 21 U.S.C. section 360bbb-3(b)(1), unless the authorization is terminated or revoked.  Performed at [facility], [address], [place], [place] [address]      Time coordinating discharge: Over 30 minutes  SIGNED:   Marinda Elk, MD  [facility] 05/06/2020, 4:00 PM Pager   If 7PM-7AM, please contact night-coverage [email or web address] Password [email or web address]

## 2020-05-09 LAB — CULTURE, BLOOD (ROUTINE X 2)
Culture: NO GROWTH
Culture: NO GROWTH
Special Requests: ADEQUATE
Special Requests: ADEQUATE

## 2020-05-10 DIAGNOSIS — M25511 Pain in right shoulder: Secondary | ICD-10-CM | POA: Diagnosis not present

## 2020-05-11 DIAGNOSIS — Z09 Encounter for follow-up examination after completed treatment for conditions other than malignant neoplasm: Secondary | ICD-10-CM | POA: Diagnosis not present

## 2020-05-11 DIAGNOSIS — R0600 Dyspnea, unspecified: Secondary | ICD-10-CM | POA: Diagnosis not present

## 2020-05-11 DIAGNOSIS — J441 Chronic obstructive pulmonary disease with (acute) exacerbation: Secondary | ICD-10-CM | POA: Diagnosis not present

## 2020-05-11 DIAGNOSIS — I10 Essential (primary) hypertension: Secondary | ICD-10-CM | POA: Diagnosis not present

## 2020-05-11 DIAGNOSIS — E785 Hyperlipidemia, unspecified: Secondary | ICD-10-CM | POA: Diagnosis not present

## 2020-05-11 DIAGNOSIS — J189 Pneumonia, unspecified organism: Secondary | ICD-10-CM | POA: Diagnosis not present

## 2020-05-11 DIAGNOSIS — E1121 Type 2 diabetes mellitus with diabetic nephropathy: Secondary | ICD-10-CM | POA: Diagnosis not present

## 2020-05-21 DIAGNOSIS — J449 Chronic obstructive pulmonary disease, unspecified: Secondary | ICD-10-CM | POA: Diagnosis not present

## 2020-05-31 NOTE — Progress Notes (Signed)
Cardiology Office Note:    Date:  06/02/2020   ID:  [name] [name], Friley [birth year], MRN [id number]  PCP:  Shirlean Mylar, MD Referring MD: Shirlean Mylar, MD    [facility]  Cardiologist:  Chilton Si, MD  Reason for visit: Annual f/u  History of Present Illness:    [name] [name] is a 75 y.o. female with a hx of moderate non-obstructive CAD by [facility] 02/2017, hypertension, hyperlipidemia, GERD, DM, COPD on 3LPM with exertion, here for follow up.  She last saw Dr. Duke Salvia in 11/2018.  To note, patient was admitted on 4/26-4/28/22 with cough and SOB.  She was treated for acute on chronic respiratory failure with hypoxia in the setting of atypical pneumonia and acute COPD exacerbation.  Treated with abx and steroids.    Today, she comes alone to clinic.  She is doing well from a CV standpoint.  She denies chest pain.  She did have elevated HR & BP when she first arrived to clinic.  She has severe COPD and had to walk aways to get to the clinic room.  She also mentions she did not know she was supposed to continue lopressor for her sinus tachycardia.  She denies chest pain, syncope, orthopnea, PND or significant pedal edema.  She lives alone and is able to do her housework/cooking and shopping.     Past Medical History:  Diagnosis Date  . Allergic rhinitis, cause unspecified   . CAD in native artery 07/25/2017  . Chronic airway obstruction, not elsewhere classified   . Depressive disorder, not elsewhere classified   . Nephritis and nephropathy, not specified as acute or chronic, with unspecified pathological lesion in kidney   . Obesity, unspecified   . Other and unspecified hyperlipidemia   . Oxygen deficiency   . Thyrotoxicosis without mention of goiter or other cause, without mention of thyrotoxic crisis or storm   . Unspecified essential hypertension   . UTI (urinary tract infection) 2019 dec    Past Surgical History:  Procedure Laterality Date  .  CHOLECYSTECTOMY    . LEFT HEART CATH AND CORONARY ANGIOGRAPHY N/A 03/01/2017   Procedure: LEFT HEART CATH AND CORONARY ANGIOGRAPHY;  Surgeon: Yvonne Kendall, MD;  Location: [facility] INVASIVE CV LAB;  Service: Cardiovascular;  Laterality: N/A;    Current Medications: Current Meds  Medication Sig  . albuterol (PROVENTIL) (2.5 MG/3ML) 0.083% nebulizer solution Take 3 mLs (2.5 mg total) by nebulization every 4 (four) hours as needed for wheezing or shortness of breath (DX: COPD 496).  . Ascorbic Acid (VITAMIN C) 1000 MG tablet Take 1,000 mg by mouth daily.  Marland Kitchen atorvastatin (LIPITOR) 20 MG tablet Take 20 mg by mouth daily.  Marland Kitchen azithromycin (ZITHROMAX) 250 MG tablet 500mg  (two tablets) today, then 250mg  (1 tablet) for the next 4 days  . Budeson-Glycopyrrol-Formoterol (BREZTRI AEROSPHERE) 160-9-4.8 MCG/ACT AERO Inhale 2 puffs into the lungs in the morning and at bedtime.  esomeprazole (NEXIUM) 40 MG capsule TAKE 1 CAPSULE (40 MG TOTAL) BY MOUTH 2 (TWO) TIMES DAILY BEFORE A MEAL.  . ferrous sulfate 325 (65 FE) MG tablet Take 325 mg by mouth daily.  . fexofenadine (ALLEGRA) 180 MG tablet Take 180 mg by mouth daily.  . fluticasone (FLONASE) 50 MCG/ACT nasal spray SPRAY 2 SPRAYS INTO EACH NOSTRIL EVERY DAY (Patient taking differently: Place 2 sprays into both nostrils daily.)  . levothyroxine (SYNTHROID) 88 MCG tablet Take 88 mcg by mouth daily.  losartan (COZAAR) 25 MG tablet  Take 25 mg by mouth daily.  . metFORMIN (GLUCOPHAGE-XR) 500 MG 24 hr tablet Take 500 mg by mouth every evening.  . metoprolol tartrate (LOPRESSOR) 50 MG tablet Take 1 tablet (50 mg total) by mouth 2 (two) times daily.  . Multiple Vitamins-Minerals (CENTRUM PO) Take 1 tablet by mouth daily.  Marland Kitchen omeprazole (PRILOSEC) 20 MG capsule TAKE 1 CAPSULE (20 MG TOTAL) BY MOUTH 2 (TWO) TIMES DAILY BEFORE A MEAL.  Marland Kitchen ONE TOUCH ULTRA TEST test strip As directed  . PROVENTIL HFA 108 (90 Base) MCG/ACT inhaler INHALE 2 PUFFS BY MOUTH INTO LUNGS EVERY  SIX HOURS AS NEEDED FOR WHEEZING OR SHORTNESS OF BREATH (Patient taking differently: Inhale 2 puffs into the lungs every 6 (six) hours as needed for wheezing or shortness of breath.)  . Tiotropium Bromide-Olodaterol (STIOLTO RESPIMAT) 2.5-2.5 MCG/ACT AERS Inhale 2 puffs into the lungs daily.  . traMADol (ULTRAM) 50 MG tablet Take 50 mg by mouth 3 (three) times daily as needed for pain.  . traZODone (DESYREL) 50 MG tablet Take 50 mg by mouth at bedtime.   Marland Kitchen venlafaxine XR (EFFEXOR-XR) 150 MG 24 hr capsule Take 150 mg by mouth daily.   . [DISCONTINUED] metoprolol tartrate (LOPRESSOR) 50 MG tablet Take 1 tablet (50 mg total) by mouth 2 (two) times daily.  . [DISCONTINUED] predniSONE (DELTASONE) 10 MG tablet Takes  5 tablets for 1 days, then 4 tablets for 1 days, then 3 tablets for 1 days, then 2 tabs for 1 days, then 1 tab for 1 days, and then stop.  . [DISCONTINUED] predniSONE (DELTASONE) 10 MG tablet Takes 5 tablets for 1 days, then 4 tablets for 1 days, then 3 tablets for 1 days, then 2 tabs for 1 days, then 1 tab for 1 days, and then stop.  . [DISCONTINUED] propranolol (INDERAL) 20 MG tablet Take 20 mg by mouth daily.     Allergies:   Lisinopril and Sulfonamide derivatives   Social History   Socioeconomic History  . Marital status: Widowed    Spouse name: Not on file  . Number of children: Not on file  . Years of education: Not on file  . Highest education level: Not on file  Occupational History  . Not on file  Tobacco Use  . Smoking status: Former Smoker    Packs/day: 2.00    Years: 30.00    Pack years: 60.00    Quit date: 01/09/1994    Years since quitting: 26.4  . Smokeless tobacco: Never Used  Vaping Use  . Vaping Use: Never used  Substance and Sexual Activity  . Alcohol use: No  . Drug use: Never  . Sexual activity: Not on file  Other Topics Concern  . Not on file  Social History Narrative  . Not on file   Social Determinants of Health   Financial Resource Strain: Not  on file  Food Insecurity: No Food Insecurity  . Worried About Programme researcher, broadcasting/film/video in the Last Year: Never true  . Ran Out of Food in the Last Year: Never true  Transportation Needs: No Transportation Needs  . Lack of Transportation (Medical): No  . Lack of Transportation (Non-Medical): No  Physical Activity: Not on file  Stress: Not on file  Social Connections: Not on file     Family History: The patient's family history includes CAD in her mother; Cancer in her brother; Diabetes in her brother and mother; Heart failure in her mother; Hyperlipidemia in her mother; Hypertension in her mother;  Stroke in her maternal grandfather.  ROS:   Please see the history of present illness.     EKGs/Labs/Other Studies Reviewed:    EKG:  The ekg ordered today demonstrates sinus tachy with PACs HR 132, QRS 35ms.  Recent Labs: 02/24/2020: NT-Pro BNP 193 05/04/2020: ALT 19; B Natriuretic Peptide 39.8; Hemoglobin 14.3; Platelets 380 05/05/2020: BUN 19; Creatinine, Ser 0.80; Potassium 4.9; Sodium 142  Recent Lipid Panel No results found for: CHOL, TRIG, HDL, CHOLHDL, VLDL, LDLCALC, LDLDIRECT  Physical Exam:    VS:  BP (!) 150/50   Pulse (!) 140   Ht 5' (1.524 m)   Wt 143 lb 9.6 oz (65.1 kg)   SpO2 95%   BMI 28.04 kg/m     Wt Readings from Last 3 Encounters:  06/02/20 143 lb 9.6 oz (65.1 kg)  05/01/20 140 lb (63.5 kg)  04/13/20 141 lb 6.4 oz (64.1 kg)     GEN:  Well nourished, well developed in no acute distress wearing O2 HEENT: Normal NECK: No JVD; No carotid bruits CARDIAC: Tachy, regular rhythm, no murmurs, rubs, gallops RESPIRATORY:  Clear to auscultation without rales, wheezing or rhonchi  ABDOMEN: Soft, non-tender, non-distended MUSCULOSKELETAL: Trace R ankle edema; No deformity  SKIN: Warm and dry NEUROLOGIC:  Alert and oriented PSYCHIATRIC:  Normal affect   ASSESSMENT AND PLAN   Nonobstructive CAD Hyperlipidemia with goal LDL <70 - Continue metoprolol, losartan. - Lipids  02/2020: total 192, HDL 43, LDL 94, Trig 336 --> Increase lipitor to 40mg  daily.  Check fasting lipids in 2 months.  HTN, controlled. - Recheck BP 126/68.  Pt states BP 120s/60-70s typically. - Continue current medications.  Sinus tachycardia - Exacerbated by her severe COPD.  F/U by Pulm. - Clarified to her that she should continue lopressor 50mg  BID that was started on her last hospitalization.  Diabetes - A1C 6.9% on 05/05/20 on metformin.  Disposition: Follow-up with Dr. in 1 year.  Call 05/07/20 sooner if needed.  Medication Adjustments/Labs and Tests Ordered: Current medicines are reviewed at length with the patient today.  Concerns regarding medicines are outlined above.  Orders Placed This Encounter  Procedures  . EKG 12-Lead   Meds ordered this encounter  Medications  . metoprolol tartrate (LOPRESSOR) 50 MG tablet    Sig: Take 1 tablet (50 mg total) by mouth 2 (two) times daily.    Dispense:  180 tablet    Refill:  3    There are no Patient Instructions on file for this visit.   Signed, Duke Salvia, PA-C  06/02/2020 11:13 AM    [facility]

## 2020-06-02 ENCOUNTER — Ambulatory Visit: Payer: HMO | Admitting: Physician Assistant

## 2020-06-02 ENCOUNTER — Encounter: Payer: Self-pay | Admitting: Physician Assistant

## 2020-06-02 ENCOUNTER — Other Ambulatory Visit: Payer: Self-pay

## 2020-06-02 VITALS — BP 126/68 | HR 140 | Ht 60.0 in | Wt 143.6 lb

## 2020-06-02 DIAGNOSIS — R000 Tachycardia, unspecified: Secondary | ICD-10-CM | POA: Diagnosis not present

## 2020-06-02 DIAGNOSIS — E785 Hyperlipidemia, unspecified: Secondary | ICD-10-CM | POA: Diagnosis not present

## 2020-06-02 DIAGNOSIS — I2510 Atherosclerotic heart disease of native coronary artery without angina pectoris: Secondary | ICD-10-CM | POA: Diagnosis not present

## 2020-06-02 DIAGNOSIS — I10 Essential (primary) hypertension: Secondary | ICD-10-CM

## 2020-06-02 MED ORDER — ATORVASTATIN CALCIUM 20 MG PO TABS: 40.0000 mg | ORAL_TABLET | Freq: Every day | ORAL | 6 refills | Status: DC

## 2020-06-02 MED ORDER — METOPROLOL TARTRATE 50 MG PO TABS: 50.0000 mg | ORAL_TABLET | Freq: Two times a day (BID) | ORAL | 3 refills | Status: DC

## 2020-06-02 NOTE — Patient Instructions (Signed)
Medication Instructions:  STOP PROPRANOLOL  INCREASE ATORVASTATIN 40MG  DAILY  TAKE METOPROLOL TART 50MG  TWICE DAILY   *If you need a refill on your cardiac medications before your next appointment, please call your pharmacy*  Lab Work: FASTING LIPID PANEL IN 3 MONTHS (September 03, 2018) If you have labs (blood work) drawn today and your tests are completely normal, you will receive your results only by:  MyChart Message (if you have MyChart) OR A paper copy in the mail.  If you have any lab test that is abnormal or we need to change your treatment, we will call you to review the results. You may go to any [facility] that is convenient for you however, we do have a lab in our office that is able to assist you. You DO NOT need an appointment for our lab. The lab is open 8:00am and closes at 4:00pm. Lunch 12:45 - 1:45pm.  Follow-Up: Your next appointment:  12 month(s) In Person with You may see , MD, September 05, 2018, PA-C, ANGELA DUKE PA-C or one of the following Advanced Practice Providers on your designated Care Team:  [place], PA-C Juanda Crumble, FNP   Please call our office 2 months in advance to schedule this appointment   At [facility], you and your health needs are our priority.  As part of our continuing mission to provide you with exceptional heart care, we have created designated Provider Care Teams.  These Care Teams include your primary Cardiologist (physician) and Advanced Practice Providers (APPs -  Physician Assistants and Nurse Practitioners) who all work together to provide you with the care you need, when you need it.

## 2020-06-04 ENCOUNTER — Encounter: Payer: Self-pay | Admitting: Cardiology

## 2020-06-04 ENCOUNTER — Telehealth: Payer: Self-pay | Admitting: Cardiovascular Disease

## 2020-06-04 NOTE — Telephone Encounter (Signed)
Pt c/o swelling: STAT is pt has developed SOB within 24 hours  1) How much weight have you gained and in what time span?  No weight gain   2) If swelling, where is the swelling located?  Right leg  3) Are you currently taking a fluid pill?  No, patient states she was on a fluid pill, but her pcp took her off it  4) Are you currently SOB?  Not currently, but she states she has COPD  5) Do you have a log of your daily weights (if so, list)?  No log available  6) Have you gained 3 pounds in a day or 5 pounds in a week?  No   7) Have you traveled recently?  No

## 2020-06-04 NOTE — Telephone Encounter (Signed)
Error

## 2020-06-04 NOTE — Telephone Encounter (Signed)
Called patient, she states that she just seen Juanda Crumble, PA-C on 05/25, and she mentioned that he was having some swelling in the right leg, but other than a few medication changes that she has followed since the visit no other changes. Patient denies chest pains, shortness of breath (she has COPD, and there is nothing new to report with her breathing), denies weight gain, and the swelling is in the right leg still (not worse since the visit, but the same as reported) no redness, and not hot to touch. Patient states that she does notice it more throughout the day and not as noticeable in the mornings. She advised that she is elevating her legs, but not using compression stockings- I did advise to try this, continue to monitor the swelling (call with any new or worsening symptoms) and advised her to monitor her weight (call with any weight gain 3lbs in a day, 5lbs in a week) patient states her BP/HR have been "good" did not give readings to provide.  I advised patient to continue to monitor- we have on call service if needed.   Patient verbalized understanding, will route message to PA who just recently seen patient to see if she has any recommendations.  Patient thankful for call back. Thanks!

## 2020-06-08 ENCOUNTER — Telehealth: Payer: Self-pay | Admitting: Cardiovascular Disease

## 2020-06-08 NOTE — Telephone Encounter (Signed)
Spoke with pt, she reports she wore the support hose x one day and the swelling got worse and she had trouble getting them off. Aware of the app recommendations. She will let us know if it gets worse or she feels she needs something for the swelling.

## 2020-06-08 NOTE — Telephone Encounter (Signed)
Morton Peters,   I agree with you.  I thought she had trace R ankle edema.  It is better in the mornings because I assume she has her legs up at night when she is sleeping.  Compression socks would help if she can tolerate.  I would prefer holding off on on a PRN lasix prescription.  It's already hot outside and I don't her to become dehydrated/lightheaded with lasix onboard.  If the leg swelling gets worse/becomes uncomfortable for her, then I am okay sending a PRN lasix 20mg  prescription if she is careful when using it.

## 2020-06-08 NOTE — Telephone Encounter (Signed)
PT is returning a call 

## 2020-06-08 NOTE — Telephone Encounter (Signed)
Called patient, unable to reach.  LVM to call back.  Left call back number.

## 2020-06-15 NOTE — Telephone Encounter (Signed)
See previous phone note. On 05/31. Patient was notified of information from PA.

## 2020-06-21 ENCOUNTER — Other Ambulatory Visit: Payer: Self-pay | Admitting: Emergency Medicine

## 2020-06-21 DIAGNOSIS — J449 Chronic obstructive pulmonary disease, unspecified: Secondary | ICD-10-CM | POA: Diagnosis not present

## 2020-06-30 ENCOUNTER — Telehealth: Payer: Self-pay | Admitting: Emergency Medicine

## 2020-06-30 MED ORDER — BREZTRI AEROSPHERE 160-9-4.8 MCG/ACT IN AERO
2.0000 | INHALATION_SPRAY | Freq: Two times a day (BID) | RESPIRATORY_TRACT | 0 refills | Status: DC
Start: 2020-06-30 — End: 2020-09-08

## 2020-06-30 NOTE — Telephone Encounter (Signed)
Spoke with pt and advised that Breztri samples have been left at front desk for pick up.

## 2020-07-26 DIAGNOSIS — J449 Chronic obstructive pulmonary disease, unspecified: Secondary | ICD-10-CM | POA: Diagnosis not present

## 2020-07-29 DIAGNOSIS — J449 Chronic obstructive pulmonary disease, unspecified: Secondary | ICD-10-CM | POA: Diagnosis not present

## 2020-08-03 ENCOUNTER — Telehealth: Payer: Self-pay | Admitting: Emergency Medicine

## 2020-08-03 DIAGNOSIS — J449 Chronic obstructive pulmonary disease, unspecified: Secondary | ICD-10-CM | POA: Diagnosis not present

## 2020-08-03 MED ORDER — PREDNISONE 10 MG PO TABS: ORAL_TABLET | ORAL | 0 refills | Status: AC

## 2020-08-03 MED ORDER — DOXYCYCLINE HYCLATE 100 MG PO TABS: 100.0000 mg | ORAL_TABLET | Freq: Two times a day (BID) | ORAL | 0 refills | Status: DC

## 2020-08-03 NOTE — Telephone Encounter (Signed)
Called and spoke with pt letting her know recs per RB and that we were going to send Rx for prednisone and doxy to pharmacy for her. Pt verbalized understanding and meds have been sent to preferred pharmacy. Nothing further needed.

## 2020-08-03 NOTE — Telephone Encounter (Signed)
Pt is requesting an rx for prednisone and an antibiotic for her cough, sinus congestion, and breathing issues states she is wearing her oxygen 24/7 and she is doing her breathing treatments 3x/daily.  Pharamcy; [id number] - [place], [place] - [address]   Pls regard; [phone]

## 2020-08-03 NOTE — Telephone Encounter (Signed)
Ok to give pred taper >> Take 40mg  daily for 3 days, then 30mg  daily for 3 days, then 20mg  daily for 3 days, then 10mg  daily for 3 days, then stop Ok to start doxycycline 100mg  bid x 7 days  If not improving then she needs an OV w APP.

## 2020-08-03 NOTE — Telephone Encounter (Signed)
Called and spoke with pt who states she began to have symptoms including productive cough, wheezing, and sinus congestion which began 5 days ago and is not getting any better.  Pt said when she coughs, she is not really able to get any phlegm up but states her cough is wet sounding when she coughs.  Pt is now wearing her O2 24/7 at 3L.  Asked pt if she has had a fever and she states that she does not know as her thermometer is not working but states that she has been having chills.  Pt has not taken a covid test recently.  Pt is using the breztri inhaler which she states she loves. States that she has been using flonase to help with the sinus congestion.  Pt has had to use her rescue inhaler twice daily and her nebulizer 2-3 times daily to help with her symptoms.  Pt wants to know what we recommend to help with her symptoms and is requesting abx and steroids.  Dr. Delton Coombes, please advise.

## 2020-08-26 ENCOUNTER — Telehealth: Payer: Self-pay | Admitting: Emergency Medicine

## 2020-08-26 DIAGNOSIS — J449 Chronic obstructive pulmonary disease, unspecified: Secondary | ICD-10-CM | POA: Diagnosis not present

## 2020-08-26 MED ORDER — PREDNISONE 10 MG PO TABS: ORAL_TABLET | ORAL | 0 refills | Status: AC

## 2020-08-26 NOTE — Telephone Encounter (Signed)
Call made to patient, confirmed DOB. Patient reports she is having increased SOB x 4 days. Denies fevers, body aches, chills, and sweats. Reports a dry cough. She confirms she is using her nebulizer twice daily, breztri twice daily, and albuterol inhaler about 2x/day. She reports even after all this her chest stills feels tight. Patient can be heard audibly wheezing on phone. She was just given doxycycline and prednisone 7/26, see phone messages. She reports using her oxygen at 3L and denies any desaturations in her oxygen.   CY please advise. Thanks

## 2020-08-26 NOTE — Telephone Encounter (Signed)
Called and spoke with patient, advised of recommendations per CY.  She verbalized understanding.  [facility] [address] [facility] [place].  Prednisone script sent.  Advised to call if she is not feeling better or feeling worse once she starts the prednisone taper.  She verbalized understanding.  Nothing further needed.

## 2020-08-26 NOTE — Telephone Encounter (Signed)
Dr Delton Coombes patient. Hosp for COPD exacerb this Spring and has had several sick calls. Suggest we send prednisone 10 mg, # 20, 4 X 2 DAYS, 3 X 2 DAYS, 2 X 2 DAYS, 1 X 2 DAYS, And ALSO bring her in to see APP or Dr Delton Coombes next available.

## 2020-09-08 ENCOUNTER — Ambulatory Visit: Payer: HMO | Admitting: Adult Health

## 2020-09-08 ENCOUNTER — Ambulatory Visit (INDEPENDENT_AMBULATORY_CARE_PROVIDER_SITE_OTHER): Payer: HMO

## 2020-09-08 ENCOUNTER — Encounter: Payer: Self-pay | Admitting: Adult Health

## 2020-09-08 ENCOUNTER — Other Ambulatory Visit: Payer: Self-pay

## 2020-09-08 VITALS — BP 110/58 | HR 91 | Temp 97.9°F | Ht 60.0 in | Wt 138.0 lb

## 2020-09-08 DIAGNOSIS — J449 Chronic obstructive pulmonary disease, unspecified: Secondary | ICD-10-CM | POA: Diagnosis not present

## 2020-09-08 DIAGNOSIS — J9611 Chronic respiratory failure with hypoxia: Secondary | ICD-10-CM | POA: Diagnosis not present

## 2020-09-08 MED ORDER — AMOXICILLIN-POT CLAVULANATE 875-125 MG PO TABS: 1.0000 | ORAL_TABLET | Freq: Two times a day (BID) | ORAL | 0 refills | Status: AC

## 2020-09-08 MED ORDER — BREZTRI AEROSPHERE 160-9-4.8 MCG/ACT IN AERO
2.0000 | INHALATION_SPRAY | Freq: Two times a day (BID) | RESPIRATORY_TRACT | 11 refills | Status: DC
Start: 2020-09-08 — End: 2020-11-04

## 2020-09-08 MED ORDER — PREDNISONE 10 MG PO TABS
ORAL_TABLET | ORAL | 0 refills | Status: DC
Start: 2020-09-08 — End: 2020-11-04
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
## 2020-09-08 NOTE — Patient Instructions (Addendum)
Augmentin 875 mg twice daily, take with food Begin prednisone 20 mg daily for 5 days, 10 mg daily for 5 days, 5 mg daily for 5 days and stop Mucinex DM twice daily as needed for cough and congestion BREZTRI 2 puffs twice daily, rinse after use Albuterol inhaler or nebulizer as needed Advance activity as tolerated Follow-up in 4 to 6 weeks with Dr. Delton Coombes and as needed Please contact office for sooner follow up if symptoms do not improve or worsen or seek emergency care

## 2020-09-08 NOTE — Assessment & Plan Note (Signed)
Continue on oxygen 3 L.  O2 saturation goals greater than 88 to 90%.

## 2020-09-08 NOTE — Assessment & Plan Note (Signed)
Slow to resolve COPD exacerbation.  Check chest x-ray today.  We will give a empiric course of antibiotics and slow prednisone taper. Continue on triple inhaler maintenance therapy.  Encouraged on compliance.  Prescription was sent to the pharmacy. On return if continues to have symptoms consider adding in Daliresp  Plan  Patient Instructions  Augmentin 875 mg twice daily, take with food Begin prednisone 20 mg daily for 5 days, 10 mg daily for 5 days, 5 mg daily for 5 days and stop Mucinex DM twice daily as needed for cough and congestion BREZTRI 2 puffs twice daily, rinse after use Albuterol inhaler or nebulizer as needed Advance activity as tolerated Follow-up in 4 to 6 weeks with Dr. Delton Coombes and as needed Please contact office for sooner follow up if symptoms do not improve or worsen or seek emergency care

## 2020-09-14 ENCOUNTER — Encounter: Payer: Self-pay | Admitting: *Deleted

## 2020-09-22 ENCOUNTER — Other Ambulatory Visit: Payer: Self-pay | Admitting: Physician Assistant

## 2020-09-26 DIAGNOSIS — J449 Chronic obstructive pulmonary disease, unspecified: Secondary | ICD-10-CM | POA: Diagnosis not present

## 2020-10-02 DIAGNOSIS — J441 Chronic obstructive pulmonary disease with (acute) exacerbation: Secondary | ICD-10-CM | POA: Diagnosis not present

## 2020-10-08 ENCOUNTER — Other Ambulatory Visit: Payer: Self-pay

## 2020-10-08 ENCOUNTER — Ambulatory Visit: Payer: HMO | Admitting: Emergency Medicine

## 2020-10-08 ENCOUNTER — Encounter: Payer: Self-pay | Admitting: Emergency Medicine

## 2020-10-08 VITALS — BP 110/68 | HR 89 | Temp 97.4°F | Ht 60.0 in | Wt 142.6 lb

## 2020-10-08 DIAGNOSIS — K219 Gastro-esophageal reflux disease without esophagitis: Secondary | ICD-10-CM | POA: Diagnosis not present

## 2020-10-08 DIAGNOSIS — J42 Unspecified chronic bronchitis: Secondary | ICD-10-CM

## 2020-10-08 DIAGNOSIS — J9611 Chronic respiratory failure with hypoxia: Secondary | ICD-10-CM | POA: Diagnosis not present

## 2020-10-08 DIAGNOSIS — R1310 Dysphagia, unspecified: Secondary | ICD-10-CM | POA: Diagnosis not present

## 2020-10-08 DIAGNOSIS — Z23 Encounter for immunization: Secondary | ICD-10-CM | POA: Diagnosis not present

## 2020-10-08 DIAGNOSIS — J301 Allergic rhinitis due to pollen: Secondary | ICD-10-CM

## 2020-10-08 DIAGNOSIS — R059 Cough, unspecified: Secondary | ICD-10-CM | POA: Diagnosis not present

## 2020-10-08 MED ORDER — PANTOPRAZOLE SODIUM 40 MG PO TBEC: 40.0000 mg | DELAYED_RELEASE_TABLET | Freq: Two times a day (BID) | ORAL | 5 refills | Status: DC

## 2020-10-08 NOTE — Assessment & Plan Note (Signed)
Continue your Allegra and Flonase as you have been taking them.

## 2020-10-08 NOTE — Assessment & Plan Note (Signed)
Continue Breztri 2 puffs twice a day.  Rinse and gargle after using. Continue your albuterol either 1 nebulizer treatment or 2 puffs up to every 4 hours if needed shortness of breath, chest tightness, wheezing.

## 2020-10-08 NOTE — Assessment & Plan Note (Signed)
Stop your Nexium. We will start Protonix (pantoprazole) 40 mg twice a day.  Take this 1 hour around food. We will refer you to see gastroenterology to evaluate your stomach acid and also your difficulty swallowing. Follow Dr. Delton Coombes in about 6 weeks so that we can review how you are doing on the new medication and the gastroenterology notes

## 2020-10-08 NOTE — Progress Notes (Signed)
Subjective:   Patient ID: [name] [name], female    DOB: 01-19-1945, 75 y.o.   MRN: [id number]  HPI [name] [name] is a 75 year old with moderate COPD. Upper airway irritation syndrome and chronic cough. Allergic rhinitis.  ROV 04/13/20 --75 year old obese woman with moderate COPD, chronic cough in the setting of this and also upper airway instability due to GERD, allergic rhinitis.  She has chronic hypoxemic respiratory failure and is on 2-3 L/min.  She had COVID-19 in December 2021.  She was last seen in our office in February at which time she was treated for an acute exacerbation of COPD with azithromycin and prednisone taper.  Today she reports that she is having more exertional SOB, has been using her O2. She is having more congestion w the allergies. She is on allegra, using flonase prn. She is doing nasal rinses. She is not on singulair.  Currently managed on Breztri.  She is using albuterol 2-3x a day.   ROV 10/08/20 --75 year old woman with a history of moderate COPD and upper airway irritation syndrome with chronic cough.  She has GERD and allergic rhinitis that contribute to both.  She also had COVID-19 in December 2021.  She is on oxygen at 2 to 3 L/min.  She was treated with Augmentin and a prednisone taper about 1 month ago for flaring symptoms including cough.  Helped while she was on it, then cough returned. Then treated again w steroids and abx again at [facility] PCP 6 days ago, again temporarily. She has breakthrough GERD even on Nexium. She has nasal congestion in the am, fairly well controlled. She is having some dysphagia - difficulty getting food to transit.   Currently managed on Breztri, albuterol which she uses Nexium 40 mg twice daily, Allegra, Flonase  Objective:   Vitals:   10/08/20 1100  BP: 110/68  Pulse: 89  Temp: (!) 97.4 F (36.3 C)  TempSrc: Oral  SpO2: 98%  Weight: 142 lb 9.6 oz (64.7 kg)  Height: 5' (1.524 m)    Gen: Pleasant, obese, in no distress,   normal affect  ENT: No lesions,  mouth clear,  oropharynx clear, no postnasal drip, strong voice today  Neck: No JVD, intermittent low pitched UA noise  Lungs: No use of accessory muscles, distant, no wheeze today  Cardiovascular: RRR, heart sounds normal, no murmur or gallops, no peripheral edema  Musculoskeletal: No deformities, no cyanosis or clubbing  Neuro: alert, non focal  Skin: Warm, no lesions or rashes   Assessment & Plan:  COPD (chronic obstructive pulmonary disease) (HCC) Continue Breztri 2 puffs twice a day.  Rinse and gargle after using. Continue your albuterol either 1 nebulizer treatment or 2 puffs up to every 4 hours if needed shortness of breath, chest tightness, wheezing.  Chronic respiratory failure with hypoxia (HCC) Continue your oxygen as you have been wearing it. Stop your Nexium. We will start Protonix (pantoprazole) 40 mg twice a day.  Take this 1 hour around food. We will refer you to see gastroenterology to evaluate your stomach acid and also your difficulty swallowing. Continue your Allegra and Flonase as you have been taking them. Follow Dr. Delton Coombes in about 6 weeks so that we can review how you are doing on the new medication and the gastroenterology notes.   Cough With upper airway instability.  It seems that the most intrusive contributor is probably GERD as she is having breakthrough symptoms even on Nexium.  We will try to treat more effectively  as below.  Continue her rhinitis therapy.  GERD (gastroesophageal reflux disease) Stop your Nexium. We will start Protonix (pantoprazole) 40 mg twice a day.  Take this 1 hour around food. We will refer you to see gastroenterology to evaluate your stomach acid and also your difficulty swallowing. Follow Dr. Delton Coombes in about 6 weeks so that we can review how you are doing on the new medication and the gastroenterology notes  Allergic rhinitis Continue your Allegra and Flonase as you have been taking  them.  Time spent 43 minutes   [name], MD, PhD 10/08/2020, 5:22 PM [facility] [phone] or if no answer [phone]

## 2020-10-08 NOTE — Assessment & Plan Note (Signed)
With upper airway instability.  It seems that the most intrusive contributor is probably GERD as she is having breakthrough symptoms even on Nexium.  We will try to treat more effectively as below.  Continue her rhinitis therapy.

## 2020-10-08 NOTE — Patient Instructions (Addendum)
Continue Breztri 2 puffs twice a day.  Rinse and gargle after using. Continue your albuterol either 1 nebulizer treatment or 2 puffs up to every 4 hours if needed shortness of breath, chest tightness, wheezing. Continue your oxygen as you have been wearing it. Stop your Nexium. We will start Protonix (pantoprazole) 40 mg twice a day.  Take this 1 hour around food. We will refer you to see gastroenterology to evaluate your stomach acid and also your difficulty swallowing. Continue your Allegra and Flonase as you have been taking them. Follow Dr. Delton Coombes in about 6 weeks so that we can review how you are doing on the new medication and the gastroenterology notes.

## 2020-10-08 NOTE — Assessment & Plan Note (Signed)
Continue your oxygen as you have been wearing it. Stop your Nexium. We will start Protonix (pantoprazole) 40 mg twice a day.  Take this 1 hour around food. We will refer you to see gastroenterology to evaluate your stomach acid and also your difficulty swallowing. Continue your Allegra and Flonase as you have been taking them. Follow Dr. Delton Coombes in about 6 weeks so that we can review how you are doing on the new medication and the gastroenterology notes.

## 2020-10-09 ENCOUNTER — Other Ambulatory Visit: Payer: Self-pay | Admitting: Adult Health

## 2020-10-13 ENCOUNTER — Encounter: Payer: Self-pay | Admitting: Gastroenterology

## 2020-10-14 ENCOUNTER — Telehealth: Payer: Self-pay | Admitting: Emergency Medicine

## 2020-10-14 MED ORDER — PANTOPRAZOLE SODIUM 40 MG PO TBEC: 40.0000 mg | DELAYED_RELEASE_TABLET | Freq: Two times a day (BID) | ORAL | 5 refills | Status: DC
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
## 2020-10-18 DIAGNOSIS — H35373 Puckering of macula, bilateral: Secondary | ICD-10-CM | POA: Diagnosis not present

## 2020-10-18 DIAGNOSIS — E113293 Type 2 diabetes mellitus with mild nonproliferative diabetic retinopathy without macular edema, bilateral: Secondary | ICD-10-CM | POA: Diagnosis not present

## 2020-10-25 ENCOUNTER — Other Ambulatory Visit: Payer: Self-pay

## 2020-10-25 ENCOUNTER — Other Ambulatory Visit: Payer: Self-pay | Admitting: Family Medicine

## 2020-10-25 ENCOUNTER — Encounter: Payer: Self-pay | Admitting: Gastroenterology

## 2020-10-25 ENCOUNTER — Ambulatory Visit (INDEPENDENT_AMBULATORY_CARE_PROVIDER_SITE_OTHER): Payer: HMO | Admitting: Gastroenterology

## 2020-10-25 VITALS — BP 110/68 | HR 92 | Ht 60.0 in | Wt 140.4 lb

## 2020-10-25 DIAGNOSIS — I2510 Atherosclerotic heart disease of native coronary artery without angina pectoris: Secondary | ICD-10-CM | POA: Diagnosis not present

## 2020-10-25 DIAGNOSIS — E119 Type 2 diabetes mellitus without complications: Secondary | ICD-10-CM | POA: Diagnosis not present

## 2020-10-25 DIAGNOSIS — K219 Gastro-esophageal reflux disease without esophagitis: Secondary | ICD-10-CM

## 2020-10-25 DIAGNOSIS — J42 Unspecified chronic bronchitis: Secondary | ICD-10-CM | POA: Diagnosis not present

## 2020-10-25 DIAGNOSIS — Z1231 Encounter for screening mammogram for malignant neoplasm of breast: Secondary | ICD-10-CM

## 2020-10-25 DIAGNOSIS — R1310 Dysphagia, unspecified: Secondary | ICD-10-CM | POA: Diagnosis not present

## 2020-10-25 DIAGNOSIS — I10 Essential (primary) hypertension: Secondary | ICD-10-CM

## 2020-10-25 NOTE — Progress Notes (Signed)
Chief Complaint: GERD, dysphagia   Referring Provider:     Ellison Hughs, MD   HPI:     [name] [name] is a 75 y.o. female with a history of CAD, COPD on 3 L O2, depression, thyroid disease, HTN, hyperlipidemia, GERD, diabetes, cholecystectomy, referred to the Gastroenterology Clinic for evaluation of reflux symptoms and dysphagia.  Hospital admission in April with COPD exacerbation and PNA.   Was last seen in the Cardiology Clinic on 06/02/2020 with plan for yearly follow-up.  Was seen in the Pulmonary clinic on 09/08/2020 and treated with ABX and slow prednisone taper for slow to resolve COPD exacerbation.  Was seen in follow-up in Pulmonary Clinic on 10/08/2020, and changed from Nexium to Protonix 40 mg bid for continued cough and COPD with GI referral and planned 6-week follow-up.  Today, she states she has had increasing reflux sxs over the last year or so with index sxs of HB, regurgitation. Improved since changing to Protonix.   Does have intermittent episodes of solid food and pill dysphagia since April. Points to suprasternal notch. No prior EGD.  No history of food impactions.   Past Medical History:  Diagnosis Date   Allergic rhinitis, cause unspecified    CAD in native artery 07/25/2017   Chronic airway obstruction, not elsewhere classified    COPD (chronic obstructive pulmonary disease) (HCC)    Depressive disorder, not elsewhere classified    Nephritis and nephropathy, not specified as acute or chronic, with unspecified pathological lesion in kidney    Obesity, unspecified    Other and unspecified hyperlipidemia    Oxygen deficiency    Thyrotoxicosis without mention of goiter or other cause, without mention of thyrotoxic crisis or storm    Unspecified essential hypertension    UTI (urinary tract infection) 2019 dec     Past Surgical History:  Procedure Laterality Date   CHOLECYSTECTOMY     LEFT HEART CATH AND CORONARY ANGIOGRAPHY N/A  03/01/2017   Procedure: LEFT HEART CATH AND CORONARY ANGIOGRAPHY;  Surgeon: Yvonne Kendall, MD;  Location: [facility];  Service: Cardiovascular;  Laterality: N/A;   Family History  Problem Relation Age of Onset   Heart failure Mother    Hyperlipidemia Mother    Hypertension Mother    Diabetes Mother    CAD Mother    Cancer Brother    Stroke Maternal Grandfather    Diabetes Brother    Social History   Tobacco Use   Smoking status: Former    Packs/day: 2.00    Years: 30.00    Pack years: 60.00    Types: Cigarettes    Start date: 01/10/1960    Quit date: 01/09/1994    Years since quitting: 26.8   Smokeless tobacco: Never  Vaping Use   Vaping Use: Never used  Substance Use Topics   Alcohol use: No   Drug use: Never   Current Outpatient Medications  Medication Sig Dispense Refill   albuterol (PROVENTIL) (2.5 MG/3ML) 0.083% nebulizer solution Take 3 mLs (2.5 mg total) by nebulization every 4 (four) hours as needed for wheezing or shortness of breath (DX: COPD 496). 150 mL 12   Ascorbic Acid (VITAMIN C) 1000 MG tablet Take 1,000 mg by mouth daily.     atorvastatin (LIPITOR) 20 MG tablet TAKE 2 TABLETS (40 MG TOTAL) BY MOUTH DAILY. 180 tablet 1   Budeson-Glycopyrrol-Formoterol (BREZTRI AEROSPHERE) 160-9-4.8 MCG/ACT AERO Inhale 2 puffs  into the lungs in the morning and at bedtime. 10.7 g 11   ferrous sulfate 325 (65 FE) MG tablet Take 325 mg by mouth daily.     fexofenadine (ALLEGRA) 180 MG tablet Take 180 mg by mouth daily.     fluticasone (FLONASE) 50 MCG/ACT nasal spray Place 2 sprays into both nostrils daily. 16 g 5   levothyroxine (SYNTHROID) 88 MCG tablet Take 88 mcg by mouth daily.     losartan (COZAAR) 25 MG tablet Take 25 mg by mouth daily.     metFORMIN (GLUCOPHAGE-XR) 500 MG 24 hr tablet Take 500 mg by mouth every evening.     metoprolol tartrate (LOPRESSOR) 50 MG tablet Take 1 tablet (50 mg total) by mouth 2 (two) times daily. 180 tablet 3   Multiple  Vitamins-Minerals (CENTRUM PO) Take 1 tablet by mouth daily.     nitroGLYCERIN (NITROSTAT) 0.4 MG SL tablet Place 1 tablet (0.4 mg total) under the tongue every 5 (five) minutes as needed for chest pain. 25 tablet 4   omeprazole (PRILOSEC) 20 MG capsule TAKE 1 CAPSULE (20 MG TOTAL) BY MOUTH 2 (TWO) TIMES DAILY BEFORE A MEAL. 30 capsule 5   ONE TOUCH ULTRA TEST test strip As directed     pantoprazole (PROTONIX) 40 MG tablet Take 1 tablet (40 mg total) by mouth 2 (two) times daily. 60 tablet 5   predniSONE (DELTASONE) 10 MG tablet 2 tabs daily for 5 days , 1 tab daily for 5 days then 1/2 tab daily for 5 days and stop 20 tablet 0   PROVENTIL HFA 108 (90 Base) MCG/ACT inhaler INHALE 2 PUFFS BY MOUTH INTO LUNGS EVERY SIX HOURS AS NEEDED FOR WHEEZING OR SHORTNESS OF BREATH (Patient taking differently: Inhale 2 puffs into the lungs every 6 (six) hours as needed for wheezing or shortness of breath.) 18 g 3   traMADol (ULTRAM) 50 MG tablet Take 50 mg by mouth 3 (three) times daily as needed for pain.     traZODone (DESYREL) 50 MG tablet Take 50 mg by mouth at bedtime.      venlafaxine XR (EFFEXOR-XR) 150 MG 24 hr capsule Take 150 mg by mouth daily.      No current facility-administered medications for this visit.   Allergies  Allergen Reactions   Lisinopril     cough   Sulfonamide Derivatives Rash     Review of Systems: All systems reviewed and negative except where noted in HPI.     Physical Exam:    Wt Readings from Last 3 Encounters:  10/25/20 140 lb 6 oz (63.7 kg)  10/08/20 142 lb 9.6 oz (64.7 kg)  09/08/20 138 lb (62.6 kg)    BP 110/68   Pulse 92   Ht 5' (1.524 m)   Wt 140 lb 6 oz (63.7 kg)   SpO2 97% Comment: @ 3LTS  BMI 27.42 kg/m  Constitutional:  Pleasant, in no acute distress. Psychiatric: Normal mood and affect. Behavior is normal. EENT: Pupils normal.  Nasal cannula in place Cardiovascular: Normal rate, regular rhythm. No edema Pulmonary/chest: Effort normal and breath  sounds normal. No wheezing, rales or rhonchi. Abdominal: Soft, nondistended, nontender. Bowel sounds active throughout. There are no masses palpable. No hepatomegaly. Neurological: Alert and oriented to person place and time. Skin: Skin is warm and dry. No rashes noted.   ASSESSMENT AND PLAN;   1) Dysphagia 2) GERD - Continue Protonix 40 mg bid for now - Continue antireflux lifestyle/dietary modifications - Barium esophagram with pill  to evaluate for luminal stricture, hiatal hernia, and gastroesophageal refluxate - Plan for EGD at [facility]  3) COPD on home O2 4) CAD 5) Hypertension 6) Diabetes - Procedure to be scheduled at [facility] due to elevated periprocedural risks   The indications, risks, and benefits of EGD were explained to the patient in detail. Risks include but are not limited to bleeding, perforation, adverse reaction to medications, and cardiopulmonary compromise. Sequelae include but are not limited to the possibility of surgery, hospitalization, and mortality. The patient verbalized understanding and wished to proceed. All questions answered, referred to scheduler. Further recommendations pending results of the exam.     Shellia Cleverly, DO, FACG  10/25/2020, 2:33 PM   Shirlean Mylar, MD Ellison Hughs, MD

## 2020-10-25 NOTE — Patient Instructions (Signed)
If you are age 76 or older, your body mass index should be between 23-30. Your Body mass index is 27.42 kg/m. If this is out of the aforementioned range listed, please consider follow up with your Primary Care Provider.  If you are age 23 or younger, your body mass index should be between 19-25. Your Body mass index is 27.42 kg/m. If this is out of the aformentioned range listed, please consider follow up with your Primary Care Provider.   __________________________________________________________  The [facility] providers would like to encourage you to use [employer] to communicate with providers for non-urgent requests or questions.  Due to long hold times on the telephone, sending your provider a message by [facility] may be a faster and more efficient way to get a response.  Please allow 48 business hours for a response.  Please remember that this is for non-urgent requests.   You have been scheduled for a Barium Esophogram at [facility]iology (1st floor of the hospital) on _______ at ________. Please arrive 15 minutes prior to your appointment for registration. Make certain not to have anything to eat or drink 3 hours prior to your test. If you need to reschedule for any reason, please contact radiology at [phone] to do so. __________________________________________________________________ A barium swallow is an examination that concentrates on views of the esophagus. This tends to be a double contrast exam (barium and two liquids which, when combined, create a gas to distend the wall of the oesophagus) or single contrast (non-ionic iodine based). The study is usually tailored to your symptoms so a good history is essential. Attention is paid during the study to the form, structure and configuration of the esophagus, looking for functional disorders (such as aspiration, dysphagia, achalasia, motility and reflux) EXAMINATION You may be asked to change into a gown, depending on the type of  swallow being performed. A radiologist and radiographer will perform the procedure. The radiologist will advise you of the type of contrast selected for your procedure and direct you during the exam. You will be asked to stand, sit or lie in several different positions and to hold a small amount of fluid in your mouth before being asked to swallow while the imaging is performed .In some instances you may be asked to swallow barium coated marshmallows to assess the motility of a solid food bolus. The exam can be recorded as a digital or video fluoroscopy procedure. POST PROCEDURE It will take 1-2 days for the barium to pass through your system. To facilitate this, it is important, unless otherwise directed, to increase your fluids for the next 24-48hrs and to resume your normal diet.  This test typically takes about 30 minutes to perform. __________________________________________________________________________________   We will contact you regarding your date for your endoscopy. It will be at [facility]. If you do not hear from our office with in 2 weeks to schedule this procedure contact us at [phone]  Thank you for choosing me and [facility].  Vito Cirigliano, D.O.

## 2020-10-26 DIAGNOSIS — J449 Chronic obstructive pulmonary disease, unspecified: Secondary | ICD-10-CM | POA: Diagnosis not present

## 2020-11-02 ENCOUNTER — Other Ambulatory Visit: Payer: Self-pay | Admitting: Gastroenterology

## 2020-11-02 ENCOUNTER — Ambulatory Visit (HOSPITAL_COMMUNITY)
Admission: RE | Admit: 2020-11-02 | Discharge: 2020-11-02 | Disposition: A | Discharge status: Home / Self Care | Payer: HMO | Source: Ambulatory Visit | Attending: Gastroenterology | Admitting: Gastroenterology

## 2020-11-02 ENCOUNTER — Telehealth: Payer: Self-pay | Admitting: Emergency Medicine

## 2020-11-02 DIAGNOSIS — K219 Gastro-esophageal reflux disease without esophagitis: Secondary | ICD-10-CM | POA: Diagnosis not present

## 2020-11-02 DIAGNOSIS — K225 Diverticulum of esophagus, acquired: Secondary | ICD-10-CM | POA: Diagnosis not present

## 2020-11-02 DIAGNOSIS — R1310 Dysphagia, unspecified: Secondary | ICD-10-CM | POA: Insufficient documentation

## 2020-11-02 DIAGNOSIS — K224 Dyskinesia of esophagus: Secondary | ICD-10-CM | POA: Diagnosis not present

## 2020-11-02 NOTE — Progress Notes (Deleted)
@Patient  ID: , female    DOB: 1945/03/28, 74 y.o.   MRN: [id number]  No chief complaint on file.   Referring provider: No ref. provider found  HPI: 75 year old female, former smoker. PMH significant for HTN, pulmonary embolism, COPD, chronic respiratory failure, GERD, type 2 DM, dyslipidemia, VCD. Patient of Dr. 61, last seen on 10/08/20. Referred to gastroenterology during last visit.   11/03/2020 Patient presents today for acute visit. Maintained on Breztri.        Allergies  Allergen Reactions   Lisinopril     cough   Sulfonamide Derivatives Rash    Immunization History  Administered Date(s) Administered   Fluad Quad(high Dose 65+) 10/08/2020   Influenza Split 11/29/2010, 10/07/2016, 10/15/2017   Influenza Whole 12/09/2008, 10/09/2009, 10/02/2010, 09/10/2011   Influenza, High Dose Seasonal PF 10/07/2016, 10/15/2017, 08/22/2018   Influenza,inj,Quad PF,6+ Mos 10/01/2012, 10/08/2013, 09/30/2014, 10/11/2015   Influenza,inj,quad, With Preservative 10/08/2014   PFIZER(Purple Top)SARS-COV-2 Vaccination 02/13/2019, 03/06/2019   Pneumococcal Conjugate-13 10/09/2013, 11/20/2013   Pneumococcal Polysaccharide-23 06/21/2001, 09/14/2010   Pneumococcal-Unspecified 10/02/2010   Td 06/21/2001   Tdap 09/14/2010   Zoster Recombinat (Shingrix) 02/27/2018, 07/01/2018   Zoster, Live 05/01/2007    Past Medical History:  Diagnosis Date   Allergic rhinitis, cause unspecified    CAD in native artery 07/25/2017   Chronic airway obstruction, not elsewhere classified    COPD (chronic obstructive pulmonary disease) (HCC)    Depressive disorder, not elsewhere classified    Nephritis and nephropathy, not specified as acute or chronic, with unspecified pathological lesion in kidney    Obesity, unspecified    Other and unspecified hyperlipidemia    Oxygen deficiency    Thyrotoxicosis without mention of goiter or other cause, without mention of thyrotoxic crisis or  storm    Unspecified essential hypertension    UTI (urinary tract infection) 2019 dec    Tobacco History: Social History   Tobacco Use  Smoking Status Former   Packs/day: 2.00   Years: 30.00   Pack years: 60.00   Types: Cigarettes   Start date: 01/10/1960   Quit date: 01/09/1994   Years since quitting: 26.8  Smokeless Tobacco Never   Counseling given: Not Answered   Outpatient Medications Prior to Visit  Medication Sig Dispense Refill   albuterol (PROVENTIL) (2.5 MG/3ML) 0.083% nebulizer solution Take 3 mLs (2.5 mg total) by nebulization every 4 (four) hours as needed for wheezing or shortness of breath (DX: COPD 496). 150 mL 12   Ascorbic Acid (VITAMIN C) 1000 MG tablet Take 1,000 mg by mouth daily.     atorvastatin (LIPITOR) 20 MG tablet TAKE 2 TABLETS (40 MG TOTAL) BY MOUTH DAILY. 180 tablet 1   Budeson-Glycopyrrol-Formoterol (BREZTRI AEROSPHERE) 160-9-4.8 MCG/ACT AERO Inhale 2 puffs into the lungs in the morning and at bedtime. 10.7 g 11   ferrous sulfate 325 (65 FE) MG tablet Take 325 mg by mouth daily.     fexofenadine (ALLEGRA) 180 MG tablet Take 180 mg by mouth daily.     fluticasone (FLONASE) 50 MCG/ACT nasal spray Place 2 sprays into both nostrils daily. 16 g 5   levothyroxine (SYNTHROID) 88 MCG tablet Take 88 mcg by mouth daily.     losartan (COZAAR) 25 MG tablet Take 25 mg by mouth daily.     metFORMIN (GLUCOPHAGE-XR) 500 MG 24 hr tablet Take 500 mg by mouth every evening.     metoprolol tartrate (LOPRESSOR) 50 MG tablet Take 1 tablet (50 mg total) by mouth  2 (two) times daily. 180 tablet 3   Multiple Vitamins-Minerals (CENTRUM PO) Take 1 tablet by mouth daily.     nitroGLYCERIN (NITROSTAT) 0.4 MG SL tablet Place 1 tablet (0.4 mg total) under the tongue every 5 (five) minutes as needed for chest pain. 25 tablet 4   omeprazole (PRILOSEC) 20 MG capsule TAKE 1 CAPSULE (20 MG TOTAL) BY MOUTH 2 (TWO) TIMES DAILY BEFORE A MEAL. 30 capsule 5   ONE TOUCH ULTRA TEST test strip As  directed     pantoprazole (PROTONIX) 40 MG tablet Take 1 tablet (40 mg total) by mouth 2 (two) times daily. 60 tablet 5   predniSONE (DELTASONE) 10 MG tablet 2 tabs daily for 5 days , 1 tab daily for 5 days then 1/2 tab daily for 5 days and stop 20 tablet 0   PROVENTIL HFA 108 (90 Base) MCG/ACT inhaler INHALE 2 PUFFS BY MOUTH INTO LUNGS EVERY SIX HOURS AS NEEDED FOR WHEEZING OR SHORTNESS OF BREATH (Patient taking differently: Inhale 2 puffs into the lungs every 6 (six) hours as needed for wheezing or shortness of breath.) 18 g 3   traMADol (ULTRAM) 50 MG tablet Take 50 mg by mouth 3 (three) times daily as needed for pain.     traZODone (DESYREL) 50 MG tablet Take 50 mg by mouth at bedtime.      venlafaxine XR (EFFEXOR-XR) 150 MG 24 hr capsule Take 150 mg by mouth daily.      No facility-administered medications prior to visit.      Review of Systems  Review of Systems   Physical Exam  There were no vitals taken for this visit. Physical Exam   Lab Results:  CBC    Component Value Date/Time   WBC 8.9 05/04/2020 0811   RBC 4.65 05/04/2020 0811   HGB 14.3 05/04/2020 0811   HGB 12.7 07/02/2019 1458   HGB 12.5 02/21/2017 1428   HCT 44.3 05/04/2020 0811   HCT 38.6 02/21/2017 1428   PLT 380 05/04/2020 0811   PLT 276 07/02/2019 1458   PLT 252 02/21/2017 1428   MCV 95.3 05/04/2020 0811   MCV 91 02/21/2017 1428   MCH 30.8 05/04/2020 0811   MCHC 32.3 05/04/2020 0811   RDW 12.1 05/04/2020 0811   RDW 13.7 02/21/2017 1428   LYMPHSABS 1.4 05/04/2020 0811   LYMPHSABS 1.6 02/21/2017 1428   MONOABS 0.7 05/04/2020 0811   EOSABS 0.1 05/04/2020 0811   EOSABS 0.4 02/21/2017 1428   BASOSABS 0.0 05/04/2020 0811   BASOSABS 0.0 02/21/2017 1428    BMET    Component Value Date/Time   NA 142 05/05/2020 0456   NA 146 (H) 02/21/2017 1428   K 4.9 05/05/2020 0456   CL 102 05/05/2020 0456   CO2 32 05/05/2020 0456   GLUCOSE 162 (H) 05/05/2020 0456   BUN 19 05/05/2020 0456   BUN 7 (L)  02/21/2017 1428   CREATININE 0.80 05/05/2020 0456   CALCIUM 9.3 05/05/2020 0456   GFRNONAA >60 05/05/2020 0456   GFRAA 48 (L) 01/01/2018 1145    BNP    Component Value Date/Time   BNP 39.8 05/04/2020 0811    ProBNP    Component Value Date/Time   PROBNP 193 02/24/2020 1545    Imaging: DG ESOPHAGUS W DOUBLE CM (HD)  Result Date: 11/02/2020 CLINICAL DATA:  Dysphasia.  GERD. EXAM: ESOPHOGRAM / BARIUM SWALLOW / BARIUM TABLET STUDY TECHNIQUE: Combined double contrast and single contrast examination performed using effervescent crystals, thick barium liquid, and  thin barium liquid. The patient was observed with fluoroscopy swallowing a 13 mm barium sulphate tablet. FLUOROSCOPY TIME:  Fluoroscopy Time:  2 minute 6 second Radiation Exposure Index (if provided by the fluoroscopic device): 45.2 Number of Acquired Spot Images: 0 COMPARISON:  05/04/2020 FINDINGS: The esophagus is patent. No stricture or mass identified. A 13 mm barium tablet was ingested which easily passed through the esophagus into the stomach. There is a small diverticula arising off the distal esophagus just before the GE junction measuring approximately 5 x 6 cm. Motility of the esophagus is mildly abnormal with multiple non-propulsive tertiary waves resulting in mild stasis of barium within the esophagus. No significant hiatal hernia or reflux visualized during the exam. Swallowing mechanism appears within normal limits. No signs of aspiration. IMPRESSION: 1. Patent esophagus without signs of stricture or mass. 2. Small distal esophageal diverticula. 3. Mild esophageal dysmotility with multiple non propulsive tertiary waves. Electronically Signed   By: Signa Kell M.D.   On: 11/02/2020 11:04     Assessment & Plan:   No problem-specific Assessment & Plan notes found for this encounter.     Glenford Bayley, NP 11/02/2020

## 2020-11-02 NOTE — Telephone Encounter (Signed)
Primary Pulmonologist: RB Last office visit and with whom: 10/08/2020 What do we see them for (pulmonary problems): COPD and cough  Last OV assessment/plan: COPD (chronic obstructive pulmonary disease) (HCC) Continue Breztri 2 puffs twice a day.  Rinse and gargle after using. Continue your albuterol either 1 nebulizer treatment or 2 puffs up to every 4 hours if needed shortness of breath, chest tightness, wheezing.   Chronic respiratory failure with hypoxia (HCC) Continue your oxygen as you have been wearing it. Stop your Nexium. We will start Protonix (pantoprazole) 40 mg twice a day.  Take this 1 hour around food. We will refer you to see gastroenterology to evaluate your stomach acid and also your difficulty swallowing. Continue your Allegra and Flonase as you have been taking them. Follow Dr. Delton Coombes in about 6 weeks so that we can review how you are doing on the new medication and the gastroenterology notes.     Cough With upper airway instability.  It seems that the most intrusive contributor is probably GERD as she is having breakthrough symptoms even on Nexium.  We will try to treat more effectively as below.  Continue her rhinitis therapy.   GERD (gastroesophageal reflux disease) Stop your Nexium. We will start Protonix (pantoprazole) 40 mg twice a day.  Take this 1 hour around food. We will refer you to see gastroenterology to evaluate your stomach acid and also your difficulty swallowing. Follow Dr. Delton Coombes in about 6 weeks so that we can review how you are doing on the new medication and the gastroenterology notes   Allergic rhinitis Continue your Allegra and Flonase as you have been taking them.    Was appointment offered to patient (explain)?  Patient wanted recommendations first.    Reason for call: Called and spoke with patient. She stated that she has been more SOB for the past 2 days. She also had developed a productive cough with yellow phlegm. Increased wheezing. She  denied any fevers or body aches.   She has been using the Breztri inhaler and albuterol nebulizer PRN. She feels like the nebulizer is not helping like it has in the past.   (examples of things to ask: : When did symptoms start? Fever? Cough? Productive? Color to sputum? More sputum than usual? Wheezing? Have you needed increased oxygen? Are you taking your respiratory medications? What over the counter measures have you tried?)  Allergies  Allergen Reactions   Lisinopril     cough   Sulfonamide Derivatives Rash    Immunization History  Administered Date(s) Administered   Fluad Quad(high Dose 65+) 10/08/2020   Influenza Split 11/29/2010, 10/07/2016, 10/15/2017   Influenza Whole 12/09/2008, 10/09/2009, 10/02/2010, 09/10/2011   Influenza, High Dose Seasonal PF 10/07/2016, 10/15/2017, 08/22/2018   Influenza,inj,Quad PF,6+ Mos 10/01/2012, 10/08/2013, 09/30/2014, 10/11/2015   Influenza,inj,quad, With Preservative 10/08/2014   PFIZER(Purple Top)SARS-COV-2 Vaccination 02/13/2019, 03/06/2019   Pneumococcal Conjugate-13 10/09/2013, 11/20/2013   Pneumococcal Polysaccharide-23 06/21/2001, 09/14/2010   Pneumococcal-Unspecified 10/02/2010   Td 06/21/2001   Tdap 09/14/2010   Zoster Recombinat (Shingrix) 02/27/2018, 07/01/2018   Zoster, Live 05/01/2007   Pharmacy is [facility] in [place].   TP, can you please advise since RB is not here today? Thanks.

## 2020-11-02 NOTE — Telephone Encounter (Signed)
We have plenty of openings tomorrow and Thursday on APP schedule for work in visit or video visit .   Will need to be evaluated   Please contact office for sooner follow up if symptoms do not improve or worsen or seek emergency care

## 2020-11-02 NOTE — Telephone Encounter (Signed)
Spoke with pt and scheduled appt with Beth for tomorrow at 10:00 am  Seek emergent care sooner if needed

## 2020-11-03 ENCOUNTER — Telehealth: Payer: Self-pay | Admitting: General Surgery

## 2020-11-03 ENCOUNTER — Ambulatory Visit: Payer: HMO | Admitting: Primary Care

## 2020-11-03 NOTE — Telephone Encounter (Signed)
-----   Message from [facility], DO sent at 11/02/2020  5:12 PM EDT ----- Barium esophagram demonstrates 5 x 6 cm diverticulum in the distal esophagus.  Otherwise no stricture, mass, or other areas of narrowing.  The 13 mm tablet passed into the stomach without issue.  Mild, nonspecific motility disorder noted as well, but otherwise no significant hernia or reflux.  The diverticulum can certainly cause symptoms, but given complaint of proximal dysphagia symptoms during clinic, we can plan to move forward with EGD at [facility] as already scheduled.

## 2020-11-03 NOTE — Progress Notes (Signed)
@Patient  ID: , female    DOB: October 04, 1945, 75 y.o.   MRN: [id number]  Chief Complaint  Patient presents with   Acute Visit    C/o increased SOB x 1 wk., cough-yellow,sweats    Referring provider: No ref. provider found  HPI: 75 year old female, former smoker quit 1996 (60-pack-year history).  Past medical history significant for hypertension, pulmonary embolism, coronary artery disease, COPD, vocal cord dysfunction, chronic respiratory failure acute on chronic respiratory failure, type 2 diabetes, dyslipidemia, iron deficiency, obesity.  Patient of Dr. 1997, last seen on 10/08/20. Referred to gastroenterology during last visit.   11/03/2020- interim hx  Patient presents today for acute visit for shortness of breath. Patient reports increased shortness of breath over the last week with associated productive cough and wheezing. Cough is new, she is getting up yellow mucus. She is complaint with 11/05/2020, which has been working very well for her. She is in the process of applying for patient assistance. She is using 2L POC, O2 90%.   Hx dysphagia. No recent aspiration. She had barium esophagram on 10/25 which demonstrated 5 x 6 cm diverticulum in the distal esophagus.  Otherwise no stricture, mass, or other areas of narrowing.  The 13 mm tablet passed into the stomach without issue.  Mild, nonspecific motility disorder noted as well, but otherwise no significant hernia or reflux. Plan for EGD.    Allergies  Allergen Reactions   Lisinopril     cough   Sulfonamide Derivatives Rash    Immunization History  Administered Date(s) Administered   Fluad Quad(high Dose 65+) 10/08/2020   Influenza Split 11/29/2010, 10/07/2016, 10/15/2017   Influenza Whole 12/09/2008, 10/09/2009, 10/02/2010, 09/10/2011   Influenza, High Dose Seasonal PF 10/07/2016, 10/15/2017, 08/22/2018   Influenza,inj,Quad PF,6+ Mos 10/01/2012, 10/08/2013, 09/30/2014, 10/11/2015    Influenza,inj,quad, With Preservative 10/08/2014   PFIZER(Purple Top)SARS-COV-2 Vaccination 02/13/2019, 03/06/2019   Pneumococcal Conjugate-13 10/09/2013, 11/20/2013   Pneumococcal Polysaccharide-23 06/21/2001, 09/14/2010   Pneumococcal-Unspecified 10/02/2010   Td 06/21/2001   Tdap 09/14/2010   Zoster Recombinat (Shingrix) 02/27/2018, 07/01/2018   Zoster, Live 05/01/2007    Past Medical History:  Diagnosis Date   Allergic rhinitis, cause unspecified    CAD in native artery 07/25/2017   Chronic airway obstruction, not elsewhere classified    COPD (chronic obstructive pulmonary disease) (HCC)    Depressive disorder, not elsewhere classified    Nephritis and nephropathy, not specified as acute or chronic, with unspecified pathological lesion in kidney    Obesity, unspecified    Other and unspecified hyperlipidemia    Oxygen deficiency    Thyrotoxicosis without mention of goiter or other cause, without mention of thyrotoxic crisis or storm    Unspecified essential hypertension    UTI (urinary tract infection) 2019 dec    Tobacco History: Social History   Tobacco Use  Smoking Status Former   Packs/day: 2.00   Years: 30.00   Pack years: 60.00   Types: Cigarettes   Start date: 01/10/1960   Quit date: 01/09/1994   Years since quitting: 26.8  Smokeless Tobacco Never   Counseling given: Not Answered   Outpatient Medications Prior to Visit  Medication Sig Dispense Refill   albuterol (PROVENTIL) (2.5 MG/3ML) 0.083% nebulizer solution Take 3 mLs (2.5 mg total) by nebulization every 4 (four) hours as needed for wheezing or shortness of breath (DX: COPD 496). 150 mL 12   Ascorbic Acid (VITAMIN C) 1000 MG tablet Take 1,000 mg by mouth daily.     atorvastatin (  LIPITOR) 20 MG tablet TAKE 2 TABLETS (40 MG TOTAL) BY MOUTH DAILY. 180 tablet 1   Budeson-Glycopyrrol-Formoterol (BREZTRI AEROSPHERE) 160-9-4.8 MCG/ACT AERO Inhale 2 puffs into the lungs in the morning and at bedtime. 10.7 g 11    ferrous sulfate 325 (65 FE) MG tablet Take 325 mg by mouth daily.     fexofenadine (ALLEGRA) 180 MG tablet Take 180 mg by mouth daily.     fluticasone (FLONASE) 50 MCG/ACT nasal spray Place 2 sprays into both nostrils daily. 16 g 5   levothyroxine (SYNTHROID) 88 MCG tablet Take 88 mcg by mouth daily.     losartan (COZAAR) 25 MG tablet Take 25 mg by mouth daily.     metFORMIN (GLUCOPHAGE-XR) 500 MG 24 hr tablet Take 500 mg by mouth every evening.     Multiple Vitamins-Minerals (CENTRUM PO) Take 1 tablet by mouth daily.     omeprazole (PRILOSEC) 20 MG capsule TAKE 1 CAPSULE (20 MG TOTAL) BY MOUTH 2 (TWO) TIMES DAILY BEFORE A MEAL. 30 capsule 5   ONE TOUCH ULTRA TEST test strip As directed     pantoprazole (PROTONIX) 40 MG tablet Take 1 tablet (40 mg total) by mouth 2 (two) times daily. 60 tablet 5   PROVENTIL HFA 108 (90 Base) MCG/ACT inhaler INHALE 2 PUFFS BY MOUTH INTO LUNGS EVERY SIX HOURS AS NEEDED FOR WHEEZING OR SHORTNESS OF BREATH (Patient taking differently: Inhale 2 puffs into the lungs every 6 (six) hours as needed for wheezing or shortness of breath.) 18 g 3   traMADol (ULTRAM) 50 MG tablet Take 50 mg by mouth 3 (three) times daily as needed for pain.     traZODone (DESYREL) 50 MG tablet Take 50 mg by mouth at bedtime.      venlafaxine XR (EFFEXOR-XR) 150 MG 24 hr capsule Take 150 mg by mouth daily.      metoprolol tartrate (LOPRESSOR) 50 MG tablet Take 1 tablet (50 mg total) by mouth 2 (two) times daily. 180 tablet 3   nitroGLYCERIN (NITROSTAT) 0.4 MG SL tablet Place 1 tablet (0.4 mg total) under the tongue every 5 (five) minutes as needed for chest pain. 25 tablet 4   predniSONE (DELTASONE) 10 MG tablet 2 tabs daily for 5 days , 1 tab daily for 5 days then 1/2 tab daily for 5 days and stop (Patient not taking: Reported on 11/04/2020) 20 tablet 0   No facility-administered medications prior to visit.   Review of Systems  Review of Systems  Constitutional: Negative.  Negative for  diaphoresis, fatigue and fever.  HENT: Negative.    Respiratory:  Positive for cough, shortness of breath and wheezing.     Physical Exam  BP 102/62 (BP Location: Left Arm, Cuff Size: Normal)   Pulse 89   Temp 98.5 F (36.9 C) (Temporal)   Ht 5' (1.524 m)   Wt 139 lb (63 kg)   SpO2 90%   BMI 27.15 kg/m  Physical Exam Constitutional:      General: She is not in acute distress.    Appearance: Normal appearance. She is not ill-appearing.  HENT:     Head: Normocephalic and atraumatic.     Mouth/Throat:     Comments: Deferred d/t masking Cardiovascular:     Rate and Rhythm: Normal rate and regular rhythm.  Pulmonary:     Effort: Pulmonary effort is normal.     Breath sounds: Normal breath sounds. No rhonchi or rales.  Musculoskeletal:        General:  Normal range of motion.     Cervical back: Normal range of motion and neck supple.  Skin:    General: Skin is warm and dry.  Neurological:     General: No focal deficit present.     Mental Status: She is alert and oriented to person, place, and time. Mental status is at baseline.  Psychiatric:        Mood and Affect: Mood normal.        Behavior: Behavior normal.        Thought Content: Thought content normal.        Judgment: Judgment normal.     Lab Results:  CBC    Component Value Date/Time   WBC 8.9 05/04/2020 0811   RBC 4.65 05/04/2020 0811   HGB 14.3 05/04/2020 0811   HGB 12.7 07/02/2019 1458   HGB 12.5 02/21/2017 1428   HCT 44.3 05/04/2020 0811   HCT 38.6 02/21/2017 1428   PLT 380 05/04/2020 0811   PLT 276 07/02/2019 1458   PLT 252 02/21/2017 1428   MCV 95.3 05/04/2020 0811   MCV 91 02/21/2017 1428   MCH 30.8 05/04/2020 0811   MCHC 32.3 05/04/2020 0811   RDW 12.1 05/04/2020 0811   RDW 13.7 02/21/2017 1428   LYMPHSABS 1.4 05/04/2020 0811   LYMPHSABS 1.6 02/21/2017 1428   MONOABS 0.7 05/04/2020 0811   EOSABS 0.1 05/04/2020 0811   EOSABS 0.4 02/21/2017 1428   BASOSABS 0.0 05/04/2020 0811   BASOSABS  0.0 02/21/2017 1428    BMET    Component Value Date/Time   NA 142 05/05/2020 0456   NA 146 (H) 02/21/2017 1428   K 4.9 05/05/2020 0456   CL 102 05/05/2020 0456   CO2 32 05/05/2020 0456   GLUCOSE 162 (H) 05/05/2020 0456   BUN 19 05/05/2020 0456   BUN 7 (L) 02/21/2017 1428   CREATININE 0.80 05/05/2020 0456   CALCIUM 9.3 05/05/2020 0456   GFRNONAA >60 05/05/2020 0456   GFRAA 48 (L) 01/01/2018 1145    BNP    Component Value Date/Time   BNP 39.8 05/04/2020 0811    ProBNP    Component Value Date/Time   PROBNP 193 02/24/2020 1545    Imaging: DG ESOPHAGUS W DOUBLE CM (HD)  Result Date: 11/02/2020 CLINICAL DATA:  Dysphasia.  GERD. EXAM: ESOPHOGRAM / BARIUM SWALLOW / BARIUM TABLET STUDY TECHNIQUE: Combined double contrast and single contrast examination performed using effervescent crystals, thick barium liquid, and thin barium liquid. The patient was observed with fluoroscopy swallowing a 13 mm barium sulphate tablet. FLUOROSCOPY TIME:  Fluoroscopy Time:  2 minute 6 second Radiation Exposure Index (if provided by the fluoroscopic device): 45.2 Number of Acquired Spot Images: 0 COMPARISON:  05/04/2020 FINDINGS: The esophagus is patent. No stricture or mass identified. A 13 mm barium tablet was ingested which easily passed through the esophagus into the stomach. There is a small diverticula arising off the distal esophagus just before the GE junction measuring approximately 5 x 6 cm. Motility of the esophagus is mildly abnormal with multiple non-propulsive tertiary waves resulting in mild stasis of barium within the esophagus. No significant hiatal hernia or reflux visualized during the exam. Swallowing mechanism appears within normal limits. No signs of aspiration. IMPRESSION: 1. Patent esophagus without signs of stricture or mass. 2. Small distal esophageal diverticula. 3. Mild esophageal dysmotility with multiple non propulsive tertiary waves. Electronically Signed   By: Signa Kell  M.D.   On: 11/02/2020 11:04     Assessment &  Plan:   COPD with acute exacerbation (HCC) - Patient developed shortness of breath with associated productive cough and wheezing 1 week ago. Sending in course Doxycycline 100mg  BID x 7 days and prednisone taper 40mg  x 2 days, 30mg  x 2 days, 20mg  x 2 days, 10mg  x 2 days. Advised she take Mucinex 1200mg  BID x 5 days and use albuterol nebulizer q 6 hours x 3-5 days or until wheezing is better. Continue Breztri Aerosphere two puffs twice daily. Patient has regular FU with Dr. next month.   Dysphagia - Hx dysphagia. No recent aspiration. She had barium esophagram on 10/25 which demonstrated 5 x 6 cm diverticulum in the distal esophagus.  Otherwise no stricture, mass, or other areas of narrowing.  The 13 mm tablet passed into the stomach without issue.  Mild, nonspecific motility disorder noted as well, but otherwise no significant hernia or reflux. Plan for EGD.    , NP 11/04/2020

## 2020-11-03 NOTE — Telephone Encounter (Signed)
Contacted the patient and went over the results with her. She verbalized understanding. Waiting for January 2023 hospital cases to open to schedule her EGD

## 2020-11-04 ENCOUNTER — Other Ambulatory Visit: Payer: Self-pay

## 2020-11-04 ENCOUNTER — Ambulatory Visit: Payer: HMO | Admitting: Primary Care

## 2020-11-04 ENCOUNTER — Encounter: Payer: Self-pay | Admitting: Primary Care

## 2020-11-04 DIAGNOSIS — R1310 Dysphagia, unspecified: Secondary | ICD-10-CM | POA: Insufficient documentation

## 2020-11-04 DIAGNOSIS — J441 Chronic obstructive pulmonary disease with (acute) exacerbation: Secondary | ICD-10-CM

## 2020-11-04 MED ORDER — DOXYCYCLINE HYCLATE 100 MG PO TABS: 100.0000 mg | ORAL_TABLET | Freq: Two times a day (BID) | ORAL | 0 refills | Status: DC

## 2020-11-04 MED ORDER — PREDNISONE 10 MG PO TABS: ORAL_TABLET | ORAL | 0 refills | Status: DC

## 2020-11-04 MED ORDER — BREZTRI AEROSPHERE 160-9-4.8 MCG/ACT IN AERO: 2.0000 | INHALATION_SPRAY | Freq: Two times a day (BID) | RESPIRATORY_TRACT | 0 refills | Status: DC

## 2020-11-04 NOTE — Assessment & Plan Note (Signed)
-   Hx dysphagia. No recent aspiration. She had barium esophagram on 10/25 which demonstrated 5 x 6 cm diverticulum in the distal esophagus.  Otherwise no stricture, mass, or other areas of narrowing.  The 13 mm tablet passed into the stomach without issue.  Mild, nonspecific motility disorder noted as well, but otherwise no significant hernia or reflux. Plan for EGD.

## 2020-11-04 NOTE — Patient Instructions (Addendum)
Recommendations: - Take mucinex 1,200mg  (two 600mg  tablets) twice a day x 5 days or until cough is better  - Continue to use Breztri Aeropshere - two puffs morning and evening - Use Albuterol inhaler or nebulizer every 6 hours for the next 3-5 days or until wheezing is better   Rx: - Prednisone taper as directed  - Doxycycline 100mg  twice a day x 7 days   Follow-up: - As scheduled with Dr. or sooner if needed

## 2020-11-04 NOTE — Assessment & Plan Note (Addendum)
-   Patient developed shortness of breath with associated productive cough and wheezing 1 week ago. Sending in course Doxycycline 100mg  BID x 7 days and prednisone taper 40mg  x 2 days, 30mg  x 2 days, 20mg  x 2 days, 10mg  x 2 days. Advised she take Mucinex 1200mg  BID x 5 days and use albuterol nebulizer q 6 hours x 3-5 days or until wheezing is better. Continue Breztri Aerosphere two puffs twice daily. Patient has regular FU with Dr. next month.

## 2020-11-11 ENCOUNTER — Other Ambulatory Visit: Payer: Self-pay | Admitting: Emergency Medicine

## 2020-11-11 ENCOUNTER — Telehealth: Payer: Self-pay | Admitting: General Surgery

## 2020-11-11 DIAGNOSIS — R1310 Dysphagia, unspecified: Secondary | ICD-10-CM

## 2020-11-11 DIAGNOSIS — Z9981 Dependence on supplemental oxygen: Secondary | ICD-10-CM

## 2020-11-11 DIAGNOSIS — K219 Gastro-esophageal reflux disease without esophagitis: Secondary | ICD-10-CM

## 2020-11-11 NOTE — Telephone Encounter (Signed)
Scheduled patient for EGD at [facility] 01/19/2021 Patient agreed to the date and advised her instructions will come to her via mychart. Patient verbalized understanding,

## 2020-11-11 NOTE — Telephone Encounter (Signed)
-----   Message from Edwin Cap, CMA sent at 10/25/2020  3:40 PM EDT ----- Regarding: [facility] Need date for endo at [facility] pt is on o2

## 2020-11-18 ENCOUNTER — Ambulatory Visit: Payer: HMO | Admitting: Emergency Medicine

## 2020-11-18 ENCOUNTER — Encounter: Payer: Self-pay | Admitting: Emergency Medicine

## 2020-11-18 ENCOUNTER — Other Ambulatory Visit: Payer: Self-pay

## 2020-11-18 DIAGNOSIS — J9611 Chronic respiratory failure with hypoxia: Secondary | ICD-10-CM

## 2020-11-18 DIAGNOSIS — J383 Other diseases of vocal cords: Secondary | ICD-10-CM | POA: Diagnosis not present

## 2020-11-18 DIAGNOSIS — J42 Unspecified chronic bronchitis: Secondary | ICD-10-CM

## 2020-11-18 DIAGNOSIS — J301 Allergic rhinitis due to pollen: Secondary | ICD-10-CM | POA: Diagnosis not present

## 2020-11-18 DIAGNOSIS — K219 Gastro-esophageal reflux disease without esophagitis: Secondary | ICD-10-CM

## 2020-11-18 NOTE — Assessment & Plan Note (Signed)
Continue Allegra once daily Increase your fluticasone nasal spray to 2 sprays each nostril twice a day.  Try not to take this right before bedtime or it might drain to your throat.

## 2020-11-18 NOTE — Assessment & Plan Note (Signed)
Multifactorial due to COPD, OHS.  Continue oxygen 2 to 3 L/min

## 2020-11-18 NOTE — Patient Instructions (Signed)
Continue Breztri 2 puffs twice a day.  Rinse and gargle after using.  Bring in your financial assistance form so we can fill it out for you Keep your albuterol available use 2 puffs when needed for shortness of breath, chest tightness, wheezing. Continue your Nexium 40 mg twice a day.  Take this medication 1 hour around food. Follow-up with gastroenterology as planned Continue Allegra once daily Increase your fluticasone nasal spray to 2 sprays each nostril twice a day.  Try not to take this right before bedtime or it might drain to your throat. Continue your oxygen at 2 to 3 L/min at all times. Follow with Dr Delton Coombes in 6 months or sooner if you have any problems

## 2020-11-18 NOTE — Progress Notes (Signed)
Subjective:   Patient ID: [name], female    DOB: 03-10-1945, 75 y.o.   MRN: [id number]  HPI Ms. Lalani is a 75 year old with moderate COPD. Upper airway irritation syndrome and chronic cough. Allergic rhinitis.  ROV 04/13/20 --75 year old obese woman with moderate COPD, chronic cough in the setting of this and also upper airway instability due to GERD, allergic rhinitis.  She has chronic hypoxemic respiratory failure and is on 2-3 L/min.  She had COVID-19 in December 2021.  She was last seen in our office in February at which time she was treated for an acute exacerbation of COPD with azithromycin and prednisone taper.  Today she reports that she is having more exertional SOB, has been using her O2. She is having more congestion w the allergies. She is on allegra, using flonase prn. She is doing nasal rinses. She is not on singulair.  Currently managed on Breztri.  She is using albuterol 2-3x a day.   ROV 10/08/20 --75 year old woman with a history of moderate COPD and upper airway irritation syndrome with chronic cough.  She has GERD and allergic rhinitis that contribute to both.  She also had COVID-19 in December 2021.  She is on oxygen at 2 to 3 L/min.  She was treated with Augmentin and a prednisone taper about 1 month ago for flaring symptoms including cough.  Helped while she was on it, then cough returned. Then treated again w steroids and abx again at [facility] PCP 6 days ago, again temporarily. She has breakthrough GERD even on Nexium. She has nasal congestion in the am, fairly well controlled. She is having some dysphagia - difficulty getting food to transit.   Currently managed on Breztri, albuterol which she uses Nexium 40 mg twice daily, Allegra, Flonase  ROV 11/18/20 --Pat follows up today.  She is 75 with moderate COPD and chronic cough due to upper airway irritation syndrome in the setting of GERD and allergic rhinitis.  She has chronic hypoxemic respiratory failure and is on  2-3 L/min.  She was seen for an acute flare in late October and treated with doxycycline and a prednisone taper, Mucinex.  She is on maintenance Breztri, Nexium, Allegra, Flonase. Needs financial assist form for the [facility].   Esophagram 11/02/20 >> patent esophagus, no mass, some delayed transit.   Objective:   Vitals:   11/18/20 1017  BP: 118/70  Pulse: 98  SpO2: 95%  Weight: 142 lb 9.6 oz (64.7 kg)  Height: 5' (1.524 m)    Gen: Pleasant, obese, in no distress,  normal affect  ENT: No lesions,  mouth clear,  oropharynx clear, no postnasal drip, strong voice today  Neck: No JVD, intermittent low pitched UA noise  Lungs: No use of accessory muscles, distant, no wheeze today  Cardiovascular: RRR, heart sounds normal, no murmur or gallops, no peripheral edema  Musculoskeletal: No deformities, no cyanosis or clubbing  Neuro: alert, non focal  Skin: Warm, no lesions or rashes   Assessment & Plan:  COPD (chronic obstructive pulmonary disease) (HCC) Reason exacerbation of COPD and upper airway irritation syndrome.  Improved after treatment with corticosteroids and doxycycline.  Plan continue her maintenance routine  Continue Breztri 2 puffs twice a day.  Rinse and gargle after using.  Bring in your financial assistance form so we can fill it out for you Keep your albuterol available use 2 puffs when needed for shortness of breath, chest tightness, wheezing. Follow with Dr Delton Coombes in 6 months or sooner  if you have any problems  Chronic respiratory failure with hypoxia (HCC) Multifactorial due to COPD, OHS.  Continue oxygen 2 to 3 L/min  Allergic rhinitis Continue Allegra once daily Increase your fluticasone nasal spray to 2 sprays each nostril twice a day.  Try not to take this right before bedtime or it might drain to your throat.  GERD (gastroesophageal reflux disease) Continue your Nexium 40 mg twice a day.  Take this medication 1 hour around food. Follow-up with  gastroenterology as planned  Vocal cord dysfunction Trying to treat GERD and chronic rhinitis as well as possible as above.     [name], MD, PhD 11/18/2020, 10:44 AM [facility] [phone] or if no answer [phone]

## 2020-11-18 NOTE — Assessment & Plan Note (Signed)
Continue your Nexium 40 mg twice a day.  Take this medication 1 hour around food. Follow-up with gastroenterology as planned

## 2020-11-18 NOTE — Assessment & Plan Note (Signed)
Trying to treat GERD and chronic rhinitis as well as possible as above.

## 2020-11-18 NOTE — Assessment & Plan Note (Signed)
Reason exacerbation of COPD and upper airway irritation syndrome.  Improved after treatment with corticosteroids and doxycycline.  Plan continue her maintenance routine  Continue Breztri 2 puffs twice a day.  Rinse and gargle after using.  Bring in your financial assistance form so we can fill it out for you Keep your albuterol available use 2 puffs when needed for shortness of breath, chest tightness, wheezing. Follow with Dr Delton Coombes in 6 months or sooner if you have any problems

## 2020-11-24 ENCOUNTER — Other Ambulatory Visit: Payer: Self-pay

## 2020-11-24 ENCOUNTER — Ambulatory Visit
Admission: RE | Admit: 2020-11-24 | Discharge: 2020-11-24 | Disposition: A | Discharge status: Home / Self Care | Payer: HMO | Source: Ambulatory Visit | Attending: Family Medicine | Admitting: Family Medicine

## 2020-11-24 DIAGNOSIS — Z1231 Encounter for screening mammogram for malignant neoplasm of breast: Secondary | ICD-10-CM

## 2020-11-26 DIAGNOSIS — J449 Chronic obstructive pulmonary disease, unspecified: Secondary | ICD-10-CM | POA: Diagnosis not present

## 2020-11-27 ENCOUNTER — Encounter (HOSPITAL_BASED_OUTPATIENT_CLINIC_OR_DEPARTMENT_OTHER): Payer: Self-pay | Admitting: *Deleted

## 2020-11-27 ENCOUNTER — Emergency Department (HOSPITAL_BASED_OUTPATIENT_CLINIC_OR_DEPARTMENT_OTHER)
Admission: EM | Admit: 2020-11-27 | Discharge: 2020-11-27 | Disposition: A | Discharge status: Home / Self Care | Payer: HMO | Attending: Emergency Medicine | Admitting: Emergency Medicine

## 2020-11-27 ENCOUNTER — Other Ambulatory Visit: Payer: Self-pay

## 2020-11-27 ENCOUNTER — Emergency Department (HOSPITAL_BASED_OUTPATIENT_CLINIC_OR_DEPARTMENT_OTHER): Payer: HMO

## 2020-11-27 DIAGNOSIS — E119 Type 2 diabetes mellitus without complications: Secondary | ICD-10-CM | POA: Diagnosis not present

## 2020-11-27 DIAGNOSIS — Z79899 Other long term (current) drug therapy: Secondary | ICD-10-CM | POA: Diagnosis not present

## 2020-11-27 DIAGNOSIS — J441 Chronic obstructive pulmonary disease with (acute) exacerbation: Secondary | ICD-10-CM | POA: Diagnosis not present

## 2020-11-27 DIAGNOSIS — I2510 Atherosclerotic heart disease of native coronary artery without angina pectoris: Secondary | ICD-10-CM | POA: Insufficient documentation

## 2020-11-27 DIAGNOSIS — Z20822 Contact with and (suspected) exposure to covid-19: Secondary | ICD-10-CM | POA: Insufficient documentation

## 2020-11-27 DIAGNOSIS — Z7984 Long term (current) use of oral hypoglycemic drugs: Secondary | ICD-10-CM | POA: Diagnosis not present

## 2020-11-27 DIAGNOSIS — R0602 Shortness of breath: Secondary | ICD-10-CM | POA: Diagnosis present

## 2020-11-27 DIAGNOSIS — R059 Cough, unspecified: Secondary | ICD-10-CM | POA: Diagnosis not present

## 2020-11-27 DIAGNOSIS — Z87891 Personal history of nicotine dependence: Secondary | ICD-10-CM | POA: Insufficient documentation

## 2020-11-27 DIAGNOSIS — I10 Essential (primary) hypertension: Secondary | ICD-10-CM | POA: Insufficient documentation

## 2020-11-27 LAB — RESP PANEL BY RT-PCR (FLU A&B, COVID) ARPGX2
Influenza A by PCR: NEGATIVE
Influenza B by PCR: NEGATIVE
SARS Coronavirus 2 by RT PCR: NEGATIVE

## 2020-11-27 MED ORDER — PREDNISONE 20 MG PO TABS: ORAL_TABLET | ORAL | 0 refills | Status: DC

## 2020-11-27 MED ORDER — PREDNISONE 50 MG PO TABS
60.0000 mg | ORAL_TABLET | Freq: Once | ORAL | Status: AC
  Administered 2020-11-27: 60 mg via ORAL
  Filled 2020-11-27: qty 1

## 2020-11-27 MED ORDER — ALBUTEROL SULFATE (2.5 MG/3ML) 0.083% IN NEBU
2.5000 mg | INHALATION_SOLUTION | Freq: Once | RESPIRATORY_TRACT | Status: AC
  Administered 2020-11-27: 2.5 mg via RESPIRATORY_TRACT
  Filled 2020-11-27: qty 3

## 2020-11-27 MED ORDER — AZITHROMYCIN 250 MG PO TABS: 250.0000 mg | ORAL_TABLET | Freq: Every day | ORAL | 0 refills | Status: DC

## 2020-11-27 MED ORDER — IPRATROPIUM-ALBUTEROL 0.5-2.5 (3) MG/3ML IN SOLN
3.0000 mL | Freq: Once | RESPIRATORY_TRACT | Status: AC
  Administered 2020-11-27: 3 mL via RESPIRATORY_TRACT
  Filled 2020-11-27: qty 3

## 2020-11-27 NOTE — Discharge Instructions (Addendum)
Start the prednisone tomorrow, 11/20.  Start the antibiotics today when you pick them up.  If you develop fever, coughing up blood, new or worsening shortness of breath, chest pain, or any other new/concerning symptoms then return to the ER for evaluation.

## 2020-11-27 NOTE — ED Notes (Signed)
Patient placed on 3 L [place] in triage room. Patient wears 2-3 L PRN at home per patient.

## 2020-11-27 NOTE — ED Provider Notes (Signed)
[facility] EMERGENCY DEPT Provider Note   CSN: [id number] Arrival date & time: 11/27/20  1139     History Chief Complaint  Patient presents with   Shortness of Breath    [name] [name] is a 75 y.o. female.  HPI 75 year old female presents with shortness of breath.  She has been feeling short of breath for a couple days but much worse today.  She has also developed a cough today.  It is nonproductive.  She feels like she might of had a fever but has not had a way to check it.  No chest pain or leg swelling.  Has used her albuterol but it is not very effective.  Past Medical History:  Diagnosis Date   Allergic rhinitis, cause unspecified    CAD in native artery 07/25/2017   Chronic airway obstruction, not elsewhere classified    COPD (chronic obstructive pulmonary disease) (HCC)    Depressive disorder, not elsewhere classified    Nephritis and nephropathy, not specified as acute or chronic, with unspecified pathological lesion in kidney    Obesity, unspecified    Other and unspecified hyperlipidemia    Oxygen deficiency    Thyrotoxicosis without mention of goiter or other cause, without mention of thyrotoxic crisis or storm    Unspecified essential hypertension    UTI (urinary tract infection) 2019 dec    Patient Active Problem List   Diagnosis Date Noted   Dysphagia 11/04/2020   Atypical pneumonia 05/04/2020   Acute on chronic respiratory failure with hypoxia (HCC) 05/04/2020   Shortness of breath 02/24/2020   History of pulmonary embolus (PE) 02/24/2020   Abnormal findings on diagnostic imaging of lung 12/11/2019   Healthcare maintenance 11/18/2019   Acute non-recurrent ethmoidal sinusitis 11/18/2019   Close exposure to COVID-19 virus 01/01/2019   Chronic respiratory failure with hypoxia (HCC) 07/30/2018   Iron deficiency anemia 01/14/2018   Pulmonary embolism (HCC) 07/26/2017   CAD in native artery 07/25/2017   Abnormal cardiac CT angiography  02/21/2017   Chest pain with high risk of acute coronary syndrome 02/21/2017   Non-insulin treated type 2 diabetes mellitus (HCC) 02/21/2017   GERD (gastroesophageal reflux disease) 12/05/2016   Vocal cord dysfunction 11/06/2014   Cough 06/04/2013   Thyrotoxicosis 10/31/2006   Dyslipidemia 10/31/2006   OBESITY 10/31/2006   DEPRESSION 10/31/2006   Essential hypertension 10/31/2006   Allergic rhinitis 10/31/2006   COPD (chronic obstructive pulmonary disease) (HCC) 10/31/2006   GLOMERULONEPHRITIS 10/31/2006    Past Surgical History:  Procedure Laterality Date   CHOLECYSTECTOMY     LEFT HEART CATH AND CORONARY ANGIOGRAPHY N/A 03/01/2017   Procedure: LEFT HEART CATH AND CORONARY ANGIOGRAPHY;  Surgeon: Yvonne Kendall, MD;  Location: [facility] INVAS[facility];  Service: Cardiovascular;  Laterality: N/A;     OB History     Gravida  0   Para  0   Term  0   Preterm  0   AB  0   Living  0      SAB  0   IAB  0   Ectopic  0   Multiple  0   Live Births  0           Family History  Problem Relation Age of Onset   Heart failure Mother    Hyperlipidemia Mother    Hypertension Mother    Diabetes Mother    CAD Mother    Cancer Brother    Stroke Maternal Grandfather    Diabetes Brother  Social History   Tobacco Use   Smoking status: Former    Packs/day: 2.00    Years: 30.00    Pack years: 60.00    Types: Cigarettes    Start date: 01/10/1960    Quit date: 01/09/1994    Years since quitting: 26.9   Smokeless tobacco: Never  Vaping Use   Vaping Use: Never used  Substance Use Topics   Alcohol use: No   Drug use: Never    Home Medications Prior to Admission medications   Medication Sig Start Date End Date Taking? Authorizing Provider  albuterol (PROVENTIL) (2.5 MG/3ML) 0.083% nebulizer solution Take 3 mLs (2.5 mg total) by nebulization every 4 (four) hours as needed for wheezing or shortness of breath (DX: COPD 496). 06/04/13  Yes Leslye Peer, MD  Ascorbic  Acid (VITAMIN C) 1000 MG tablet Take 1,000 mg by mouth daily.   Yes [provider]  atorvastatin (LIPITOR) 20 MG tablet TAKE 2 TABLETS (40 MG TOTAL) BY MOUTH DAILY. 09/22/20  Yes Juanda Crumble K, PA-C  azithromycin (ZITHROMAX) 250 MG tablet Take 1 tablet (250 mg total) by mouth daily. Take first 2 tablets together, then 1 every day until finished. 11/27/20  Yes Pricilla Loveless, MD  Budeson-Glycopyrrol-Formoterol (BREZTRI AEROSPHERE) 160-9-4.8 MCG/ACT AERO Inhale 2 puffs into the lungs in the morning and at bedtime. 11/04/20  Yes Glenford Bayley, NP  ferrous sulfate 325 (65 FE) MG tablet Take 325 mg by mouth daily. 10/19/18  Yes [provider]  fexofenadine (ALLEGRA) 180 MG tablet Take 180 mg by mouth daily.   Yes [provider]  levothyroxine (SYNTHROID) 88 MCG tablet Take 88 mcg by mouth daily. 10/09/18  Yes [provider]  losartan (COZAAR) 25 MG tablet Take 25 mg by mouth daily.   Yes [provider]  metFORMIN (GLUCOPHAGE-XR) 500 MG 24 hr tablet Take 500 mg by mouth every evening. 12/20/18  Yes [provider]  Multiple Vitamins-Minerals (CENTRUM PO) Take 1 tablet by mouth daily.   Yes [provider]  omeprazole (PRILOSEC) 20 MG capsule TAKE 1 CAPSULE (20 MG TOTAL) BY MOUTH 2 (TWO) TIMES DAILY BEFORE A MEAL. 10/07/18  Yes Leslye Peer, MD  predniSONE (DELTASONE) 20 MG tablet 2 tabs po daily x 4 days 11/27/20  Yes Pricilla Loveless, MD  PROVENTIL HFA 108 [phone] Base) MCG/ACT inhaler INHALE 2 PUFFS BY MOUTH INTO LUNGS EVERY SIX HOURS AS NEEDED FOR WHEEZING OR SHORTNESS OF BREATH Patient taking differently: Inhale 2 puffs into the lungs every 6 (six) hours as needed for wheezing or shortness of breath. 12/11/19  Yes Leslye Peer, MD  traZODone (DESYREL) 50 MG tablet Take 50 mg by mouth at bedtime.  02/28/13  Yes [provider]  venlafaxine XR (EFFEXOR-XR) 150 MG 24 hr capsule Take 150 mg by mouth daily.  07/03/17  Yes  [provider]  fluticasone (FLONASE) 50 MCG/ACT nasal spray SPRAY 2 SPRAYS INTO EACH NOSTRIL EVERY DAY 11/11/20   Leslye Peer, MD  metoprolol tartrate (LOPRESSOR) 50 MG tablet Take 1 tablet (50 mg total) by mouth 2 (two) times daily. 06/02/20 08/31/20  Cannon Kettle, PA-C  nitroGLYCERIN (NITROSTAT) 0.4 MG SL tablet Place 1 tablet (0.4 mg total) under the tongue every 5 (five) minutes as needed for chest pain. 02/21/17 01/01/18  Abelino Derrick, PA-C  ONE TOUCH ULTRA TEST test strip As directed 05/18/11   [provider]  pantoprazole (PROTONIX) 40 MG tablet Take 1 tablet (40 mg  total) by mouth 2 (two) times daily. 10/14/20   Leslye Peer, MD  traMADol (ULTRAM) 50 MG tablet Take 50 mg by mouth 3 (three) times daily as needed for pain. 03/25/20   [provider]    Allergies    Lisinopril and Sulfonamide derivatives  Review of Systems   Review of Systems  Constitutional:  Positive for fever.  Respiratory:  Positive for cough, shortness of breath and wheezing.   Cardiovascular:  Negative for chest pain and leg swelling.  All other systems reviewed and are negative.  Physical Exam Updated Vital Signs BP 111/69 (BP Location: Right Arm)   Pulse 95   Temp 97.8 F (36.6 C)   Resp (!) 32   Ht 5' (1.524 m)   Wt 63.5 kg   SpO2 100%   BMI 27.34 kg/m   Physical Exam Vitals and nursing note reviewed.  Constitutional:      Appearance: She is well-developed.  HENT:     Head: Normocephalic and atraumatic.     Right Ear: External ear normal.     Left Ear: External ear normal.     Nose: Nose normal.  Eyes:     General:        Right eye: No discharge.        Left eye: No discharge.  Cardiovascular:     Rate and Rhythm: Normal rate and regular rhythm.     Heart sounds: Normal heart sounds.  Pulmonary:     Effort: Pulmonary effort is normal. Tachypnea present. No accessory muscle usage.     Breath sounds: Decreased breath sounds and wheezing present.   Abdominal:     Palpations: Abdomen is soft.     Tenderness: There is no abdominal tenderness.  Musculoskeletal:     Right lower leg: No edema.     Left lower leg: No edema.  Skin:    General: Skin is warm and dry.  Neurological:     Mental Status: She is alert.  Psychiatric:        Mood and Affect: Mood is not anxious.    ED Results / Procedures / Treatments   Labs (all labs ordered are listed, but only abnormal results are displayed) Labs Reviewed  RESP PANEL BY RT-PCR (FLU A&B, COVID) [id number]    EKG EKG Interpretation  Date/Time:  Saturday November 27 2020 12:16:26 EST Ventricular Rate:  113 PR Interval:  144 QRS Duration: 72 QT Interval:  342 QTC Calculation: 469 R Axis:   70 Text Interpretation: Sinus tachycardia with occasional Premature ventricular complexes Low voltage QRS similar to April 2022 Confirmed by Pricilla Loveless [phone]) on 11/27/2020 12:55:21 PM  Radiology No results found.  Procedures Procedures   Medications Ordered in ED Medications  albuterol (PROVENTIL) (2.5 MG/3ML) 0.083% nebulizer solution 2.5 mg (2.5 mg Nebulization Given 11/27/20 1309)  ipratropium-albuterol (DUONEB) 0.5-2.5 (3) MG/3ML nebulizer solution 3 mL (3 mLs Nebulization Given 11/27/20 1309)  predniSONE (DELTASONE) tablet 60 mg (60 mg Oral Given 11/27/20 1306)    ED Course  I have reviewed the triage vital signs and the nursing notes.  Pertinent labs & imaging results that were available during my care of the patient were reviewed by me and considered in my medical decision making (see chart for details).    MDM Rules/Calculators/A&P                           Patient is feeling much better after albuterol treatments.  She was started on steroids and we will give her a burst of steroids.  Otherwise, there has been a significant delay in radiology reports due to a technological issue.  However I do not see an obvious pneumothorax or large pneumonia on my interpretation.   Patient does not want a wait any further and I think this is reasonable.  Will discharge with antibiotics and steroids for COPD exacerbation.  She is feeling well enough for discharge.  COVID/flu testing is negative. Final Clinical Impression(s) / ED Diagnoses Final diagnoses:  COPD exacerbation (HCC)    Rx / DC Orders ED Discharge Orders          Ordered    predniSONE (DELTASONE) 20 MG tablet        11/27/20 1443    azithromycin (ZITHROMAX) 250 MG tablet  Daily        11/27/20 1443             Pricilla Loveless, MD 11/27/20 1445

## 2020-11-27 NOTE — ED Triage Notes (Signed)
Patient has history of COPD and is using O2 3L prn.  Complaint of worsening shortness of breath for 2 days.

## 2020-12-08 DIAGNOSIS — R4589 Other symptoms and signs involving emotional state: Secondary | ICD-10-CM | POA: Diagnosis not present

## 2020-12-08 DIAGNOSIS — J441 Chronic obstructive pulmonary disease with (acute) exacerbation: Secondary | ICD-10-CM | POA: Diagnosis not present

## 2020-12-26 DIAGNOSIS — J449 Chronic obstructive pulmonary disease, unspecified: Secondary | ICD-10-CM | POA: Diagnosis not present

## 2021-01-07 ENCOUNTER — Encounter (HOSPITAL_COMMUNITY): Payer: Self-pay | Admitting: Gastroenterology

## 2021-01-14 ENCOUNTER — Telehealth: Payer: Self-pay | Admitting: Emergency Medicine

## 2021-01-14 MED ORDER — PREDNISONE 10 MG PO TABS: ORAL_TABLET | ORAL | 0 refills | Status: AC

## 2021-01-14 MED ORDER — DOXYCYCLINE HYCLATE 100 MG PO TABS: 100.0000 mg | ORAL_TABLET | Freq: Two times a day (BID) | ORAL | 0 refills | Status: DC

## 2021-01-14 NOTE — Telephone Encounter (Signed)
Ok to give pred > Take 40mg  daily for 3 days, then 30mg  daily for 3 days, then 20mg  daily for 3 days, then 10mg  daily for 3 days, then stop Doxycycline 100mg  bid x 7 d

## 2021-01-14 NOTE — Telephone Encounter (Signed)
I called the patient and let her know the provider recommendations and she voices understanding. I have sent the medication per Dr. Delton Coombes recommendations and verified the pharmacy. Nothing further is needed.

## 2021-01-14 NOTE — Telephone Encounter (Signed)
Called and spoke with patient. She stated that she started having more SOB episodes yesterday. She really notices the SOB with exertion. She also has a productive cough with yellowish phlegm. Increased wheezing. She denied any fever or body aches. Still using her Breztri daily and albuterol nebs and HFA as needed.   She is requesting a round of prednisone and abx.   Pharmacy is [facility] in [place].   RB, can you please advise? Thanks!

## 2021-01-19 ENCOUNTER — Ambulatory Visit (HOSPITAL_COMMUNITY): Payer: HMO | Admitting: Certified Registered Nurse Anesthetist

## 2021-01-19 ENCOUNTER — Other Ambulatory Visit: Payer: Self-pay

## 2021-01-19 ENCOUNTER — Encounter (HOSPITAL_COMMUNITY): Payer: Self-pay | Admitting: Gastroenterology

## 2021-01-19 ENCOUNTER — Ambulatory Visit (HOSPITAL_COMMUNITY)
Admission: RE | Admit: 2021-01-19 | Discharge: 2021-01-19 | Disposition: A | Discharge status: Home / Self Care | Payer: HMO | Attending: Gastroenterology | Admitting: Gastroenterology

## 2021-01-19 ENCOUNTER — Encounter (HOSPITAL_COMMUNITY)
Admission: RE | Disposition: A | Discharge status: Home / Self Care | Payer: Self-pay | Source: Home / Self Care | Attending: Gastroenterology

## 2021-01-19 DIAGNOSIS — I2510 Atherosclerotic heart disease of native coronary artery without angina pectoris: Secondary | ICD-10-CM | POA: Insufficient documentation

## 2021-01-19 DIAGNOSIS — R1310 Dysphagia, unspecified: Secondary | ICD-10-CM | POA: Diagnosis not present

## 2021-01-19 DIAGNOSIS — R933 Abnormal findings on diagnostic imaging of other parts of digestive tract: Secondary | ICD-10-CM | POA: Diagnosis not present

## 2021-01-19 DIAGNOSIS — K2990 Gastroduodenitis, unspecified, without bleeding: Secondary | ICD-10-CM

## 2021-01-19 DIAGNOSIS — Z7984 Long term (current) use of oral hypoglycemic drugs: Secondary | ICD-10-CM | POA: Insufficient documentation

## 2021-01-19 DIAGNOSIS — Z87891 Personal history of nicotine dependence: Secondary | ICD-10-CM | POA: Insufficient documentation

## 2021-01-19 DIAGNOSIS — Z9981 Dependence on supplemental oxygen: Secondary | ICD-10-CM | POA: Diagnosis not present

## 2021-01-19 DIAGNOSIS — Z79899 Other long term (current) drug therapy: Secondary | ICD-10-CM | POA: Diagnosis not present

## 2021-01-19 DIAGNOSIS — K2970 Gastritis, unspecified, without bleeding: Secondary | ICD-10-CM

## 2021-01-19 DIAGNOSIS — K219 Gastro-esophageal reflux disease without esophagitis: Secondary | ICD-10-CM | POA: Insufficient documentation

## 2021-01-19 DIAGNOSIS — K31A19 Gastric intestinal metaplasia without dysplasia, unspecified site: Secondary | ICD-10-CM | POA: Diagnosis not present

## 2021-01-19 DIAGNOSIS — R1011 Right upper quadrant pain: Secondary | ICD-10-CM

## 2021-01-19 DIAGNOSIS — I10 Essential (primary) hypertension: Secondary | ICD-10-CM | POA: Diagnosis not present

## 2021-01-19 DIAGNOSIS — K2950 Unspecified chronic gastritis without bleeding: Secondary | ICD-10-CM | POA: Diagnosis not present

## 2021-01-19 DIAGNOSIS — J449 Chronic obstructive pulmonary disease, unspecified: Secondary | ICD-10-CM | POA: Insufficient documentation

## 2021-01-19 DIAGNOSIS — E119 Type 2 diabetes mellitus without complications: Secondary | ICD-10-CM | POA: Insufficient documentation

## 2021-01-19 DIAGNOSIS — E039 Hypothyroidism, unspecified: Secondary | ICD-10-CM | POA: Insufficient documentation

## 2021-01-19 HISTORY — PX: BIOPSY: SHX5522

## 2021-01-19 HISTORY — PX: ESOPHAGOGASTRODUODENOSCOPY (EGD) WITH PROPOFOL: SHX5813

## 2021-01-19 LAB — GLUCOSE, CAPILLARY: Glucose-Capillary: 111 mg/dL — ABNORMAL HIGH (ref 70–99)

## 2021-01-19 SURGERY — ESOPHAGOGASTRODUODENOSCOPY (EGD) WITH PROPOFOL
Anesthesia: Monitor Anesthesia Care

## 2021-01-19 MED ORDER — PROPOFOL 500 MG/50ML IV EMUL
INTRAVENOUS | Status: DC | PRN
  Administered 2021-01-19: 100 ug/kg/min via INTRAVENOUS

## 2021-01-19 MED ORDER — SODIUM CHLORIDE 0.9 % IV SOLN: INTRAVENOUS | Status: DC

## 2021-01-19 MED ORDER — ESMOLOL HCL 100 MG/10ML IV SOLN
INTRAVENOUS | Status: DC | PRN
  Administered 2021-01-19: 20 mg via INTRAVENOUS

## 2021-01-19 MED ORDER — PHENYLEPHRINE 40 MCG/ML (10ML) SYRINGE FOR IV PUSH (FOR BLOOD PRESSURE SUPPORT)
PREFILLED_SYRINGE | INTRAVENOUS | Status: DC | PRN
  Administered 2021-01-19: 80 ug via INTRAVENOUS

## 2021-01-19 MED ORDER — LIDOCAINE 2% (20 MG/ML) 5 ML SYRINGE
INTRAMUSCULAR | Status: DC | PRN
  Administered 2021-01-19: 40 mg via INTRAVENOUS

## 2021-01-19 MED ORDER — PROPOFOL 10 MG/ML IV BOLUS
INTRAVENOUS | Status: DC | PRN
  Administered 2021-01-19 (×2): 30 mg via INTRAVENOUS
  Administered 2021-01-19: 40 mg via INTRAVENOUS

## 2021-01-19 MED ORDER — LACTATED RINGERS IV SOLN: INTRAVENOUS | Status: DC

## 2021-01-19 SURGICAL SUPPLY — 15 items

## 2021-01-19 NOTE — Interval H&P Note (Signed)
History and Physical Interval Note:  01/19/2021 9:29 AM  [name] [name]  has presented today for surgery, with the diagnosis of gerd, dysphagia, 2lpm oxygen.  The various methods of treatment have been discussed with the patient and family. After consideration of risks, benefits and other options for treatment, the patient has consented to  Procedure(s): ESOPHAGOGASTRODUODENOSCOPY (EGD) WITH PROPOFOL (N/A) as a surgical intervention.  The patient's history has been reviewed, patient examined, no change in status, stable for surgery.  I have reviewed the patient's chart and labs.  Questions were answered to the patient's satisfaction.     [name] [name]

## 2021-01-19 NOTE — Transfer of Care (Signed)
Immediate Anesthesia Transfer of Care Note  Patient: [name] [name]  Procedure(s) Performed: ESOPHAGOGASTRODUODENOSCOPY (EGD) WITH PROPOFOL BIOPSY  Patient Location: PACU and Endoscopy Unit  Anesthesia Type:MAC  Level of Consciousness: awake, alert  and oriented  Airway & Oxygen Therapy: Patient Spontanous Breathing and Patient connected to face mask  Post-op Assessment: Report given to RN and Post -op Vital signs reviewed and stable  Post vital signs: Reviewed and stable  Last Vitals:  Vitals Value Taken Time  BP 118/61 01/19/21 1007  Temp    Pulse 106 01/19/21 1008  Resp 24 01/19/21 1008  SpO2 100 % 01/19/21 1008  Vitals shown include unvalidated device data.  Last Pain:  Vitals:   01/19/21 0902  TempSrc: Temporal  PainSc: 0-No pain         Complications: No notable events documented.

## 2021-01-19 NOTE — Discharge Instructions (Signed)
YOU HAD AN ENDOSCOPIC PROCEDURE TODAY: Refer to the procedure report and other information in the discharge instructions given to you for any specific questions about what was found during the examination. If this information does not answer your questions, please call [place] office at [phone] to clarify.  ° °YOU SHOULD EXPECT: Some feelings of bloating in the abdomen. Passage of more gas than usual. Walking can help get rid of the air that was put into your GI tract during the procedure and reduce the bloating. If you had a lower endoscopy (such as a colonoscopy or flexible sigmoidoscopy) you may notice spotting of blood in your stool or on the toilet paper. Some abdominal soreness may be present for a day or two, also. ° °DIET: Your first meal following the procedure should be a light meal and then it is ok to progress to your normal diet. A half-sandwich or bowl of soup is an example of a good first meal. Heavy or fried foods are harder to digest and may make you feel nauseous or bloated. Drink plenty of fluids but you should avoid alcoholic beverages for 24 hours. If you had a esophageal dilation, please see attached instructions for diet.   ° °ACTIVITY: Your care partner should take you home directly after the procedure. You should plan to take it easy, moving slowly for the rest of the day. You can resume normal activity the day after the procedure however YOU SHOULD NOT DRIVE, use power tools, machinery or perform tasks that involve climbing or major physical exertion for 24 hours (because of the sedation medicines used during the test).  ° °SYMPTOMS TO REPORT IMMEDIATELY: °A gastroenterologist can be reached at any hour. Please call [phone]  for any of the following symptoms:  °Following lower endoscopy (colonoscopy, flexible sigmoidoscopy) °Excessive amounts of blood in the stool  °Significant tenderness, worsening of abdominal pains  °Swelling of the abdomen that is new, acute  °Fever of 100° or  higher  °Following upper endoscopy (EGD, EUS, ERCP, esophageal dilation) °Vomiting of blood or coffee ground material  °New, significant abdominal pain  °New, significant chest pain or pain under the shoulder blades  °Painful or persistently difficult swallowing  °New shortness of breath  °Black, tarry-looking or red, bloody stools ° °FOLLOW UP:  °If any biopsies were taken you will be contacted by phone or by letter within the next 1-3 weeks. Call [phone]  if you have not heard about the biopsies in 3 weeks.  °Please also call with any specific questions about appointments or follow up tests. ° °

## 2021-01-19 NOTE — H&P (Signed)
GASTROENTEROLOGY PROCEDURE H&P NOTE   Primary Care Physician: Mila Palmer, MD    Reason for Procedure:  GERD, increasing reflux symptoms, dysphagia, abnormal barium esophagram, RUQ pain  Plan:    EGD    The nature of the procedure, as well as the risks, benefits, and alternatives were carefully and thoroughly reviewed with the patient. Ample time for discussion and questions allowed. The patient understood, was satisfied, and agreed to proceed.   Due to elevated periprocedural risks, procedure today scheduled at [facility].    HPI: [name] [name] is a 76 y.o. female who presents for EGD for evaluation of increasing reflux symptoms, intermittent solid/pill dysphagia, and recent barium esophagram demonstrating 5 x 6 cm distal esophageal diverticula, and intermittent RUQ pain.  Past Medical History:  Diagnosis Date   Allergic rhinitis, cause unspecified    CAD in native artery 07/25/2017   Chronic airway obstruction, not elsewhere classified    COPD (chronic obstructive pulmonary disease) (HCC)    Depressive disorder, not elsewhere classified    Nephritis and nephropathy, not specified as acute or chronic, with unspecified pathological lesion in kidney    Obesity, unspecified    Other and unspecified hyperlipidemia    Oxygen deficiency    Thyrotoxicosis without mention of goiter or other cause, without mention of thyrotoxic crisis or storm    Unspecified essential hypertension    UTI (urinary tract infection) 2019 dec    Past Surgical History:  Procedure Laterality Date   CHOLECYSTECTOMY     LEFT HEART CATH AND CORONARY ANGIOGRAPHY N/A 03/01/2017   Procedure: LEFT HEART CATH AND CORONARY ANGIOGRAPHY;  Surgeon: Yvonne Kendall, MD;  Location: [facility];  Service: Cardiovascular;  Laterality: N/A;    Prior to Admission medications   Medication Sig Start Date End Date Taking? Authorizing Provider  albuterol (PROVENTIL) (2.5 MG/3ML) 0.083% nebulizer  solution Take 3 mLs (2.5 mg total) by nebulization every 4 (four) hours as needed for wheezing or shortness of breath (DX: COPD 496). 06/04/13  Yes Leslye Peer, MD  Ascorbic Acid (VITAMIN C) 1000 MG tablet Take 1,000 mg by mouth daily.   Yes [provider]  atorvastatin (LIPITOR) 20 MG tablet TAKE 2 TABLETS (40 MG TOTAL) BY MOUTH DAILY. Patient taking differently: Take 20 mg by mouth 2 (two) times daily. 09/22/20  Yes Cannon Kettle, PA-C  Budeson-Glycopyrrol-Formoterol (BREZTRI AEROSPHERE) 160-9-4.8 MCG/ACT AERO Inhale 2 puffs into the lungs in the morning and at bedtime. 11/04/20  Yes Glenford Bayley, NP  Calcium Carb-Cholecalciferol (CALCIUM 600+D3 PO) Take 1 tablet by mouth daily.   Yes [provider]  Cholecalciferol (VITAMIN D-3 PO) Take 1 tablet by mouth daily.   Yes [provider]  doxycycline (VIBRA-TABS) 100 MG tablet Take 1 tablet (100 mg total) by mouth 2 (two) times daily. 01/14/21  Yes Leslye Peer, MD  esomeprazole (NEXIUM) 40 MG capsule Take 40 mg by mouth 2 (two) times daily. 11/26/20  Yes [provider]  ferrous sulfate 325 (65 FE) MG tablet Take 325 mg by mouth daily. 10/19/18  Yes [provider]  fexofenadine (ALLEGRA) 180 MG tablet Take 180 mg by mouth daily.   Yes [provider]  fluticasone (FLONASE) 50 MCG/ACT nasal spray SPRAY 2 SPRAYS INTO EACH NOSTRIL EVERY DAY Patient taking differently: 1 spray daily. 11/11/20  Yes Leslye Peer, MD  levothyroxine (SYNTHROID) 88 MCG tablet Take 88 mcg by mouth daily. 10/09/18  Yes [provider]  losartan (COZAAR)  25 MG tablet Take 25 mg by mouth daily.   Yes [provider]  metFORMIN (GLUCOPHAGE-XR) 500 MG 24 hr tablet Take 500 mg by mouth every evening. 12/20/18  Yes [provider]  metoprolol tartrate (LOPRESSOR) 50 MG tablet Take 1 tablet (50 mg total) by mouth 2 (two) times daily. 06/02/20 01/19/21 Yes Cannon Kettle, PA-C   Multiple Vitamins-Minerals (CENTRUM PO) Take 1 tablet by mouth daily.   Yes [provider]  nitroGLYCERIN (NITROSTAT) 0.4 MG SL tablet Place 1 tablet (0.4 mg total) under the tongue every 5 (five) minutes as needed for chest pain. 02/21/17 01/10/21 Yes Kilroy, Luke K, PA-C  pantoprazole (PROTONIX) 40 MG tablet Take 1 tablet (40 mg total) by mouth 2 (two) times daily. 10/14/20  Yes Leslye Peer, MD  predniSONE (DELTASONE) 10 MG tablet Take 4 tablets (40 mg total) by mouth daily with breakfast for 4 days, THEN 3 tablets (30 mg total) daily with breakfast for 3 days, THEN 2 tablets (20 mg total) daily with breakfast for 3 days, THEN 1 tablet (10 mg total) daily with breakfast for 3 days. 01/14/21 01/27/21 Yes Byrum, Les Pou, MD  PROVENTIL HFA 108 [phone] Base) MCG/ACT inhaler INHALE 2 PUFFS BY MOUTH INTO LUNGS EVERY SIX HOURS AS NEEDED FOR WHEEZING OR SHORTNESS OF BREATH Patient taking differently: Inhale 2 puffs into the lungs every 6 (six) hours as needed for wheezing or shortness of breath. 12/11/19  Yes Leslye Peer, MD  traZODone (DESYREL) 50 MG tablet Take 50 mg by mouth at bedtime.  02/28/13  Yes [provider]  venlafaxine XR (EFFEXOR-XR) 150 MG 24 hr capsule Take 150 mg by mouth daily.  07/03/17  Yes [provider]  ONE TOUCH ULTRA TEST test strip As directed 05/18/11   [provider]    Current Facility-Administered Medications  Medication Dose Route Frequency Provider Last Rate Last Admin   0.9 %  sodium chloride infusion   Intravenous Continuous Charnee Turnipseed V, DO       lactated ringers infusion   Intravenous Continuous Charne Mcbrien V, DO 10 mL/hr at 01/19/21 0911 New Bag at 01/19/21 0911    Allergies as of 11/11/2020 - Review Complete 11/04/2020  Allergen Reaction Noted   Lisinopril  08/01/2011   Sulfonamide derivatives Rash 02/12/2007    Family History  Problem Relation Age of Onset   Heart failure Mother    Hyperlipidemia Mother     Hypertension Mother    Diabetes Mother    CAD Mother    Cancer Brother    Stroke Maternal Grandfather    Diabetes Brother     Social History   Socioeconomic History   Marital status: Widowed    Spouse name: Not on file   Number of children: Not on file   Years of education: Not on file   Highest education level: Not on file  Occupational History   Not on file  Tobacco Use   Smoking status: Former    Packs/day: 2.00    Years: 30.00    Pack years: 60.00    Types: Cigarettes    Start date: 01/10/1960    Quit date: 01/09/1994    Years since quitting: 27.0   Smokeless tobacco: Never  Vaping Use   Vaping Use: Never used  Substance and Sexual Activity   Alcohol use: No   Drug use: Never   Sexual activity: Not on file  Other Topics Concern   Not on file  Social History  Narrative   Not on file   Social Determinants of Health   Financial Resource Strain: Not on file  Food Insecurity: Not on file  Transportation Needs: Not on file  Physical Activity: Not on file  Stress: Not on file  Social Connections: Not on file  Intimate Partner Violence: Not on file    Physical Exam: Vital signs in last 24 hours: @BP  (!) 141/66    Pulse 96    Temp (!) 96 F (35.6 C) (Temporal)    Resp (!) 27    Ht 5' (1.524 m)    Wt 63.5 kg    SpO2 100%    BMI 27.34 kg/m  GEN: NAD EYE: Sclerae anicteric ENT: MMM CV: Non-tachycardic Pulm: CTA b/l GI: Soft, NT/ND NEURO:  Alert & Oriented x 3   Doristine LocksVito Abygayle Deltoro, DO [facility]   01/19/2021 9:28 AM

## 2021-01-19 NOTE — Op Note (Signed)
[facility] Patient Name: [name] [name] Procedure Date: 01/19/2021 MRN: [id number] Attending MD: Gerrit Heck , MD Date of Birth: [birth year] CSN: [id number] Age: 76 Admit Type: Outpatient Procedure:                Upper GI endoscopy Indications:              Dysphagia, Heartburn, Esophageal reflux, Abnormal                            cine-esophagram, RUQ pain                           75 yo with long-standing hx of GERD with recent                            increase in reflux sxs along with intermittent                            dysphagia to pills and intermittent RUQ pain,                            presents for EGD. Esophagram in 10/2020 with 5x6 mm                            distal esophageal diverticulum and mildly abnormal                            esophageal motility with stasis. The tablet and                            contrast otherwise moved briskly into the stomach                            on imaging. Providers:                Gerrit Heck, MD, Carlyn Reichert, RN, Cherylynn Ridges, Technician, Margurite Auerbach Referring MD:              Medicines:                Monitored Anesthesia Care Complications:            No immediate complications. Estimated Blood Loss:     Estimated blood loss was minimal. Procedure:                Pre-Anesthesia Assessment:                           - Prior to the procedure, a History and Physical                            was performed, and patient medications and                            allergies were reviewed. The patient's  tolerance of                            previous anesthesia was also reviewed. The risks                            and benefits of the procedure and the sedation                            options and risks were discussed with the patient.                            All questions were answered, and informed consent                            was obtained. Prior  Anticoagulants: The patient has                            taken no previous anticoagulant or antiplatelet                            agents. ASA Grade Assessment: IV - A patient with                            severe systemic disease that is a constant threat                            to life. After reviewing the risks and benefits,                            the patient was deemed in satisfactory condition to                            undergo the procedure.                           After obtaining informed consent, the endoscope was                            passed under direct vision. Throughout the                            procedure, the patient's blood pressure, pulse, and                            oxygen saturations were monitored continuously. The                            [id number] Olympus endoscope was introduced                            through the mouth, and advanced to the second part  of duodenum. The upper GI endoscopy was                            accomplished without difficulty. The patient                            tolerated the procedure well. Scope In: 9:54:10 AM Scope Out: 10:03:14 AM Total Procedure Duration: 0 hours 9 minutes 3 seconds  Findings:      The examined esophagus was normal. The diverticulum noted on esophagram       was not seen on prolonged anterograde and retroflexed views with full       insufflation. Otherwise, no areas of luminal narrowing, stricture, or       rings noted on this study.      The Z-line was regular and was found 37 cm from the incisors.      Patchy mild inflammation characterized by congestion (edema) and       erythema was found in the gastric body and in the gastric antrum.       Biopsies were taken with a cold forceps for histology. Estimated blood       loss was minimal.      The examined duodenum was normal. Impression:               - Normal esophagus.                           -  Z-line regular, 37 cm from the incisors.                           - Gastritis. Biopsied.                           - Normal examined duodenum. Moderate Sedation:      Not Applicable - Patient had care per Anesthesia. Recommendation:           - Patient has a contact number available for                            emergencies. The signs and symptoms of potential                            delayed complications were discussed with the                            patient. Return to normal activities tomorrow.                            Written discharge instructions were provided to the                            patient.                           - Resume previous diet.                           - Continue present medications.                           -  Await pathology results.                           - Follow-up in the GI Clinic prn.                           - If UGI symptoms persist, plan for esophageal                            manometry and pH/Impedance testing. Procedure Code(s):        --- Professional ---                           [phone], Esophagogastroduodenoscopy, flexible,                            transoral; with biopsy, single or multiple Diagnosis Code(s):        --- Professional ---                           K29.70, Gastritis, unspecified, without bleeding                           R13.10, Dysphagia, unspecified                           R12, Heartburn                           K21.9, Gastro-esophageal reflux disease without                            esophagitis                           R93.3, Abnormal findings on diagnostic imaging of                            other parts of digestive tract CPT copyright 2019 American Medical Association. All rights reserved. The codes documented in this report are preliminary and upon coder review may  be revised to meet current compliance requirements. Gerrit Heck, MD 01/19/2021 10:14:11 AM Number of Addenda: 0

## 2021-01-19 NOTE — Anesthesia Preprocedure Evaluation (Addendum)
Anesthesia Evaluation  Patient identified by MRN, date of birth, ID band Patient awake    Reviewed: Allergy & Precautions, NPO status , Patient's Chart, lab work & pertinent test results, reviewed documented beta blocker date and time   Airway Mallampati: II  TM Distance: >3 FB Neck ROM: Full    Dental  (+) Dental Advisory Given, Edentulous Upper, Edentulous Lower   Pulmonary COPD,  COPD inhaler and oxygen dependent, former smoker,    Pulmonary exam normal breath sounds clear to auscultation       Cardiovascular hypertension, Pt. on home beta blockers and Pt. on medications + CAD  Normal cardiovascular exam Rhythm:Regular Rate:Normal     Neuro/Psych PSYCHIATRIC DISORDERS Depression negative neurological ROS     GI/Hepatic Neg liver ROS, GERD  Medicated,Dysphagia    Endo/Other  diabetes, Type 2, Oral Hypoglycemic AgentsHypothyroidism   Renal/GU Renal disease     Musculoskeletal negative musculoskeletal ROS (+)   Abdominal   Peds  Hematology negative hematology ROS (+)   Anesthesia Other Findings Day of surgery medications reviewed with the patient.  Reproductive/Obstetrics                            Anesthesia Physical Anesthesia Plan  ASA: 4  Anesthesia Plan: MAC   Post-op Pain Management: Minimal or no pain anticipated   Induction: Intravenous  PONV Risk Score and Plan: 2 and Propofol infusion and Treatment may vary due to age or medical condition  Airway Management Planned: Natural Airway  Additional Equipment:   Intra-op Plan:   Post-operative Plan:   Informed Consent: I have reviewed the patients History and Physical, chart, labs and discussed the procedure including the risks, benefits and alternatives for the proposed anesthesia with the patient or authorized representative who has indicated his/her understanding and acceptance.     Dental advisory given  Plan  Discussed with: CRNA  Anesthesia Plan Comments:        Anesthesia Quick Evaluation

## 2021-01-21 ENCOUNTER — Encounter (HOSPITAL_COMMUNITY): Payer: Self-pay | Admitting: Gastroenterology

## 2021-01-21 ENCOUNTER — Telehealth: Payer: Self-pay | Admitting: General Surgery

## 2021-01-21 LAB — SURGICAL PATHOLOGY

## 2021-01-21 NOTE — Telephone Encounter (Signed)
-----   Message from [place] V, DO sent at 01/21/2021  2:52 PM EST ----- Biopsies from the stomach demonstrate chronic, active gastritis (inflammation) with intestinal metaplasia.  No Helicobacter pylori infection.  While Gastric intestinal metaplasia is a described premalignant entity, per current Gastrointestinal societal guidelines, the decision to proceed with repeat endoscopy with gastric mapping should be individualized, and is typically reserved for those at increased risk for progression to gastric adenocarcinoma (i.e family history of stomach cancer, recurrent Helicobacter pylori infection).  In those at increased risk, guideline recommendations are for repeat upper endoscopy with additional biopsies in 3 years.  Otherwise, no additional studies or medications are indicated at this time for this finding.

## 2021-01-21 NOTE — Anesthesia Postprocedure Evaluation (Signed)
Anesthesia Post Note  Patient: [name] [name]  Procedure(s) Performed: ESOPHAGOGASTRODUODENOSCOPY (EGD) WITH PROPOFOL BIOPSY     Patient location during evaluation: Endoscopy Anesthesia Type: MAC Level of consciousness: awake and alert Pain management: pain level controlled Vital Signs Assessment: post-procedure vital signs reviewed and stable Respiratory status: spontaneous breathing, nonlabored ventilation, respiratory function stable and patient connected to nasal cannula oxygen Cardiovascular status: stable and blood pressure returned to baseline Postop Assessment: no apparent nausea or vomiting Anesthetic complications: no   No notable events documented.  Last Vitals:  Vitals:   01/19/21 1020 01/19/21 1028  BP: 136/62 140/78  Pulse: (!) 104 (!) 102  Resp: 19 (!) 26  Temp:    SpO2: 100% 100%    Last Pain:  Vitals:   01/19/21 1020  TempSrc: Bladder  PainSc:    Pain Goal:                   Catalina Gravel

## 2021-01-21 NOTE — Telephone Encounter (Signed)
Spoke with the patient and she verbalized understanding of her biopsies. She is wanting a repeat endo in 3 years for gastric mapping. Patient states she wants to stay on top of this because she has no family history or hx of h, pylori.

## 2021-01-26 DIAGNOSIS — J449 Chronic obstructive pulmonary disease, unspecified: Secondary | ICD-10-CM | POA: Diagnosis not present

## 2021-01-28 DIAGNOSIS — H6123 Impacted cerumen, bilateral: Secondary | ICD-10-CM | POA: Diagnosis not present

## 2021-01-28 DIAGNOSIS — H9202 Otalgia, left ear: Secondary | ICD-10-CM | POA: Diagnosis not present

## 2021-02-26 DIAGNOSIS — J449 Chronic obstructive pulmonary disease, unspecified: Secondary | ICD-10-CM | POA: Diagnosis not present

## 2021-03-08 DIAGNOSIS — I10 Essential (primary) hypertension: Secondary | ICD-10-CM | POA: Diagnosis not present

## 2021-03-08 DIAGNOSIS — J441 Chronic obstructive pulmonary disease with (acute) exacerbation: Secondary | ICD-10-CM | POA: Diagnosis not present

## 2021-03-08 DIAGNOSIS — J449 Chronic obstructive pulmonary disease, unspecified: Secondary | ICD-10-CM | POA: Diagnosis not present

## 2021-03-08 DIAGNOSIS — E785 Hyperlipidemia, unspecified: Secondary | ICD-10-CM | POA: Diagnosis not present

## 2021-03-09 ENCOUNTER — Other Ambulatory Visit: Payer: Self-pay | Admitting: Emergency Medicine

## 2021-03-11 DIAGNOSIS — E785 Hyperlipidemia, unspecified: Secondary | ICD-10-CM | POA: Diagnosis not present

## 2021-03-11 DIAGNOSIS — E039 Hypothyroidism, unspecified: Secondary | ICD-10-CM | POA: Diagnosis not present

## 2021-03-11 DIAGNOSIS — I10 Essential (primary) hypertension: Secondary | ICD-10-CM | POA: Diagnosis not present

## 2021-03-11 DIAGNOSIS — E1121 Type 2 diabetes mellitus with diabetic nephropathy: Secondary | ICD-10-CM | POA: Diagnosis not present

## 2021-03-15 DIAGNOSIS — E039 Hypothyroidism, unspecified: Secondary | ICD-10-CM | POA: Diagnosis not present

## 2021-03-15 DIAGNOSIS — N183 Chronic kidney disease, stage 3 unspecified: Secondary | ICD-10-CM | POA: Diagnosis not present

## 2021-03-15 DIAGNOSIS — I2510 Atherosclerotic heart disease of native coronary artery without angina pectoris: Secondary | ICD-10-CM | POA: Diagnosis not present

## 2021-03-15 DIAGNOSIS — E1121 Type 2 diabetes mellitus with diabetic nephropathy: Secondary | ICD-10-CM | POA: Diagnosis not present

## 2021-03-15 DIAGNOSIS — Z0000 Encounter for general adult medical examination without abnormal findings: Secondary | ICD-10-CM | POA: Diagnosis not present

## 2021-03-15 DIAGNOSIS — Z23 Encounter for immunization: Secondary | ICD-10-CM | POA: Diagnosis not present

## 2021-03-15 DIAGNOSIS — I10 Essential (primary) hypertension: Secondary | ICD-10-CM | POA: Diagnosis not present

## 2021-03-15 DIAGNOSIS — E785 Hyperlipidemia, unspecified: Secondary | ICD-10-CM | POA: Diagnosis not present

## 2021-03-15 DIAGNOSIS — Z9981 Dependence on supplemental oxygen: Secondary | ICD-10-CM | POA: Diagnosis not present

## 2021-03-22 ENCOUNTER — Telehealth: Payer: Self-pay | Admitting: Emergency Medicine

## 2021-03-22 MED ORDER — PREDNISONE 20 MG PO TABS: 20.0000 mg | ORAL_TABLET | Freq: Every day | ORAL | 0 refills | Status: DC

## 2021-03-22 MED ORDER — AZITHROMYCIN 250 MG PO TABS: ORAL_TABLET | ORAL | 0 refills | Status: AC

## 2021-03-22 NOTE — Telephone Encounter (Signed)
Spoke to patient. ?C/o increased sob with exertion, prod cough with yellow sputum and wheezing x3d. ?Denied f/c/s or additional sx.  ?She wears 3L-4L cont. Spo2 is currently 100% on 3L. Advised patient to go down to 2L.  ?Two covid vaccines and flu shot.  ?No recent covid or flu test. ?She is using Breztri BID, albuterol solution 2-3x daily and albuterol HFA BID.  ? ? ?  ?Dr. Delton Coombes, please advise.  ?

## 2021-03-22 NOTE — Telephone Encounter (Signed)
Please make sure she is taking her Protonix, Flonase, Allegra ?Take pred 20 mg qd x 5 days ?Take azithro > Z-pack ?Needs to be seen if persistent sx ?

## 2021-03-22 NOTE — Telephone Encounter (Signed)
Spoke to patient.  ?Patient verified that she is still taking protonix, flonase and allegra. ?Prednisone and zpak sent to preferred pharmacy.  ?Nothing further needed.  ?

## 2021-03-24 ENCOUNTER — Other Ambulatory Visit: Payer: Self-pay | Admitting: Physician Assistant

## 2021-03-26 DIAGNOSIS — J449 Chronic obstructive pulmonary disease, unspecified: Secondary | ICD-10-CM | POA: Diagnosis not present

## 2021-03-31 DIAGNOSIS — E039 Hypothyroidism, unspecified: Secondary | ICD-10-CM | POA: Diagnosis not present

## 2021-03-31 DIAGNOSIS — F320 Major depressive disorder, single episode, mild: Secondary | ICD-10-CM | POA: Diagnosis not present

## 2021-03-31 DIAGNOSIS — J441 Chronic obstructive pulmonary disease with (acute) exacerbation: Secondary | ICD-10-CM | POA: Diagnosis not present

## 2021-03-31 DIAGNOSIS — E1121 Type 2 diabetes mellitus with diabetic nephropathy: Secondary | ICD-10-CM | POA: Diagnosis not present

## 2021-03-31 DIAGNOSIS — I10 Essential (primary) hypertension: Secondary | ICD-10-CM | POA: Diagnosis not present

## 2021-03-31 DIAGNOSIS — N183 Chronic kidney disease, stage 3 unspecified: Secondary | ICD-10-CM | POA: Diagnosis not present

## 2021-03-31 DIAGNOSIS — E785 Hyperlipidemia, unspecified: Secondary | ICD-10-CM | POA: Diagnosis not present

## 2021-03-31 DIAGNOSIS — I2510 Atherosclerotic heart disease of native coronary artery without angina pectoris: Secondary | ICD-10-CM | POA: Diagnosis not present

## 2021-04-04 ENCOUNTER — Encounter (HOSPITAL_BASED_OUTPATIENT_CLINIC_OR_DEPARTMENT_OTHER): Payer: Self-pay | Admitting: Cardiovascular Disease

## 2021-04-05 ENCOUNTER — Other Ambulatory Visit: Payer: Self-pay | Admitting: Adult Health

## 2021-04-20 DIAGNOSIS — H35373 Puckering of macula, bilateral: Secondary | ICD-10-CM | POA: Diagnosis not present

## 2021-04-20 DIAGNOSIS — E113293 Type 2 diabetes mellitus with mild nonproliferative diabetic retinopathy without macular edema, bilateral: Secondary | ICD-10-CM | POA: Diagnosis not present

## 2021-04-22 ENCOUNTER — Telehealth: Payer: Self-pay | Admitting: Emergency Medicine

## 2021-04-22 MED ORDER — BREZTRI AEROSPHERE 160-9-4.8 MCG/ACT IN AERO: 2.0000 | INHALATION_SPRAY | Freq: Two times a day (BID) | RESPIRATORY_TRACT | 11 refills | Status: DC

## 2021-04-22 NOTE — Telephone Encounter (Signed)
ATC patient to let her know that I am sending in RX for Breztri to her preferred pharmacy. Left message letting her know it was sent in and to call the office with any questions or concerns. Nothing further needed at this time. ?

## 2021-04-26 DIAGNOSIS — J449 Chronic obstructive pulmonary disease, unspecified: Secondary | ICD-10-CM | POA: Diagnosis not present

## 2021-05-02 ENCOUNTER — Other Ambulatory Visit: Payer: Self-pay

## 2021-05-02 ENCOUNTER — Other Ambulatory Visit: Payer: Self-pay | Admitting: Cardiovascular Disease

## 2021-05-02 MED ORDER — BREZTRI AEROSPHERE 160-9-4.8 MCG/ACT IN AERO: 2.0000 | INHALATION_SPRAY | Freq: Two times a day (BID) | RESPIRATORY_TRACT | 11 refills | Status: DC

## 2021-05-02 NOTE — Addendum Note (Signed)
Addended by: Dorisann Frames R on: 05/02/2021 09:44 AM ? ? Modules accepted: Orders ? ?

## 2021-05-04 ENCOUNTER — Telehealth: Payer: Self-pay

## 2021-05-04 NOTE — Telephone Encounter (Signed)
Completed application for Breztri assistance was faxed to [employer].  ?

## 2021-05-05 ENCOUNTER — Other Ambulatory Visit: Payer: Self-pay | Admitting: Physician Assistant

## 2021-05-10 ENCOUNTER — Telehealth: Payer: Self-pay | Admitting: Emergency Medicine

## 2021-05-10 NOTE — Telephone Encounter (Signed)
Called and spoke with pt to see if we could get her scheduled for an appt with RB 5/3 and she verbalized understanding. Appt scheduled for pt at 11am. Nothing further needed. ?

## 2021-05-11 ENCOUNTER — Ambulatory Visit: Payer: HMO | Admitting: Emergency Medicine

## 2021-05-11 ENCOUNTER — Encounter: Payer: Self-pay | Admitting: Emergency Medicine

## 2021-05-11 DIAGNOSIS — J42 Unspecified chronic bronchitis: Secondary | ICD-10-CM

## 2021-05-11 DIAGNOSIS — J9611 Chronic respiratory failure with hypoxia: Secondary | ICD-10-CM

## 2021-05-11 MED ORDER — AZITHROMYCIN 250 MG PO TABS: ORAL_TABLET | ORAL | 0 refills | Status: DC

## 2021-05-11 MED ORDER — PREDNISONE 10 MG PO TABS: ORAL_TABLET | ORAL | 0 refills | Status: DC

## 2021-05-11 MED ORDER — BREZTRI AEROSPHERE 160-9-4.8 MCG/ACT IN AERO
2.0000 | INHALATION_SPRAY | Freq: Two times a day (BID) | RESPIRATORY_TRACT | 0 refills | Status: DC
Start: 2021-05-11 — End: 2021-07-27

## 2021-05-11 NOTE — Assessment & Plan Note (Signed)
With acute flare today.  Combination of upper and lower airways disease.  Plan to treat her with a course of prednisone and azithromycin and then follow-up for improvement.  Continue Breztri as her maintenance therapy.  Try adding Mucinex to allow better secretion mobilization. ? ?Please take prednisone as directed until completely gone. ?Please take azithromycin until completely gone. ?Continue Breztri 2 puffs twice a day.  Rinse and gargle after using. ?Keep your albuterol available to use 2 puffs or 1 nebulizer treatment up to every 4 hours if needed for shortness of breath, chest tightness, wheezing. ?Try using guaifenesin (Mucinex) 600 mg once daily to loosen mucus and allow it to clear more easily. ?Follow with APP in 1 month to ensure that you are improving. ?Follow with Dr Delton Coombes in 6 months or sooner if you have any problems ?

## 2021-05-11 NOTE — Progress Notes (Signed)
?  Subjective:  ? ?Patient ID: [name] [name], female    DOB: [birth year], 76 y.o.   MRN: [id number] ? ?HPI ? ?ROV 11/18/20 --Pat follows up today.  She is 76 with moderate COPD and chronic cough due to upper airway irritation syndrome in the setting of GERD and allergic rhinitis.  She has chronic hypoxemic respiratory failure and is on 2-3 L/min.  She was seen for an acute flare in late October and treated with doxycycline and a prednisone taper, Mucinex.  She is on maintenance Breztri, Nexium, Allegra, Flonase. Needs financial assist form for the [facility].  ? ?Esophagram 11/02/20 >> patent esophagus, no mass, some delayed transit.  ? ?Acute OV 05/11/2021 --76 years old with moderate COPD, chronic cough and upper airway irritation syndrome impacted by GERD and allergic rhinitis.  Also chronic hypoxemic respiratory failure due to mixed obstruction and restriction.  Currently managed on Breztri, Flonase, Nexium. Her allegra was just changed to an alternative.  ?Today she reports that she has been using 4L/min pulsed with exertion. She has had some progressive dyspnea, increased allergies and dry cough. She has been out of breztri x 3 days. Albuterol 2-4x a day.  ? ?Objective:  ? ?Vitals:  ? 05/11/21 1056  ?BP: 128/72  ?Pulse: (!) 103  ?Temp: 98.2 ?F (36.8 ?C)  ?TempSrc: Oral  ?SpO2: 95%  ?Weight: 134 lb 12.8 oz (61.1 kg)  ?Height: 5' (1.524 m)  ?  ?Gen: Pleasant, obese, in no distress,  normal affect ? ?ENT: No lesions,  mouth clear,  oropharynx clear, no postnasal drip, strong voice today ? ?Neck: No JVD, intermittent low pitched UA noise ? ?Lungs: No use of accessory muscles, distant, no wheeze today ? ?Cardiovascular: RRR, heart sounds normal, no murmur or gallops, no peripheral edema ? ?Musculoskeletal: No deformities, no cyanosis or clubbing ? ?Neuro: alert, non focal ? ?Skin: Warm, no lesions or rashes ? ? ?Assessment & Plan:  ?COPD (chronic obstructive pulmonary disease) (HCC) ?With acute flare today.   Combination of upper and lower airways disease.  Plan to treat her with a course of prednisone and azithromycin and then follow-up for improvement.  Continue Breztri as her maintenance therapy.  Try adding Mucinex to allow better secretion mobilization. ? ?Please take prednisone as directed until completely gone. ?Please take azithromycin until completely gone. ?Continue Breztri 2 puffs twice a day.  Rinse and gargle after using. ?Keep your albuterol available to use 2 puffs or 1 nebulizer treatment up to every 4 hours if needed for shortness of breath, chest tightness, wheezing. ?Try using guaifenesin (Mucinex) 600 mg once daily to loosen mucus and allow it to clear more easily. ?Follow with APP in 1 month to ensure that you are improving. ?Follow with Dr Delton Coombes in 6 months or sooner if you have any problems ? ?Chronic respiratory failure with hypoxia (HCC) ?High exertional oxygen requirement.  Continue 4 L/min POC with exertion. ? ? ? ? ? [name], MD, PhD ?05/11/2021, 11:32 AM ?[facility] ?[phone] or if no answer [phone] ? ? ? ? ?

## 2021-05-11 NOTE — Patient Instructions (Addendum)
Please take prednisone as directed until completely gone. ?Please take azithromycin until completely gone. ?Continue Breztri 2 puffs twice a day.  Rinse and gargle after using. ?Keep your albuterol available to use 2 puffs or 1 nebulizer treatment up to every 4 hours if needed for shortness of breath, chest tightness, wheezing. ?Continue your fluticasone nasal spray ?Continue Nexium ?Call and let us know which allergy medication you have been taking and substitute for Allegra. ?Try using guaifenesin (Mucinex) 600 mg once daily to loosen mucus and allow it to clear more easily. ?Continue your oxygen at 4 L/min pulsed with exertion. ?Follow with APP in 1 month to ensure that you are improving. ?Follow with Dr Delton Coombes in 6 months or sooner if you have any problems ?

## 2021-05-11 NOTE — Assessment & Plan Note (Signed)
High exertional oxygen requirement.  Continue 4 L/min POC with exertion. ?

## 2021-05-26 DIAGNOSIS — J449 Chronic obstructive pulmonary disease, unspecified: Secondary | ICD-10-CM | POA: Diagnosis not present

## 2021-06-02 ENCOUNTER — Telehealth: Payer: Self-pay | Admitting: Emergency Medicine

## 2021-06-03 MED ORDER — BREZTRI AEROSPHERE 160-9-4.8 MCG/ACT IN AERO: 2.0000 | INHALATION_SPRAY | Freq: Two times a day (BID) | RESPIRATORY_TRACT | 0 refills | Status: DC

## 2021-06-03 NOTE — Telephone Encounter (Signed)
Patient checking on handicap placard. Patient coming today to pick up. Patient phone number is [phone].

## 2021-06-03 NOTE — Telephone Encounter (Signed)
Beth would you be willing to sign placard request as you were last NP to see pt?

## 2021-06-03 NOTE — Telephone Encounter (Signed)
Patient came into the office- I was able to call [employer] and get the placard refaxed- Placard is in Dr. Agustina Caroli box. Patient is also requesting Breztri samples. Patient informed that Dr. Lamonte Sakai is out of the clinic until 6/6, but she would like a NP to sign placard this afternoon.  Please call patients mobile, [phone]  Please advise.

## 2021-06-03 NOTE — Telephone Encounter (Signed)
Found application. Will place on Beth's desk for signature.

## 2021-06-03 NOTE — Telephone Encounter (Signed)
SURE

## 2021-06-03 NOTE — Telephone Encounter (Signed)
Called patient but she did not answer. Her VM is not setup. I will place her placard and Breztri samples at the front desk for pickup.

## 2021-06-03 NOTE — Telephone Encounter (Signed)
Called patient but she did not answer. I looked in [facility] and up front and did not see any paperwork for patient.   I left a message for her to call us back before coming to office.

## 2021-06-08 DIAGNOSIS — I2510 Atherosclerotic heart disease of native coronary artery without angina pectoris: Secondary | ICD-10-CM | POA: Diagnosis not present

## 2021-06-08 DIAGNOSIS — E039 Hypothyroidism, unspecified: Secondary | ICD-10-CM | POA: Diagnosis not present

## 2021-06-08 DIAGNOSIS — F320 Major depressive disorder, single episode, mild: Secondary | ICD-10-CM | POA: Diagnosis not present

## 2021-06-08 DIAGNOSIS — J441 Chronic obstructive pulmonary disease with (acute) exacerbation: Secondary | ICD-10-CM | POA: Diagnosis not present

## 2021-06-08 DIAGNOSIS — I10 Essential (primary) hypertension: Secondary | ICD-10-CM | POA: Diagnosis not present

## 2021-06-08 DIAGNOSIS — N183 Chronic kidney disease, stage 3 unspecified: Secondary | ICD-10-CM | POA: Diagnosis not present

## 2021-06-08 DIAGNOSIS — E1121 Type 2 diabetes mellitus with diabetic nephropathy: Secondary | ICD-10-CM | POA: Diagnosis not present

## 2021-06-08 DIAGNOSIS — E785 Hyperlipidemia, unspecified: Secondary | ICD-10-CM | POA: Diagnosis not present

## 2021-06-18 IMAGING — MG DIGITAL SCREENING BILAT W/ TOMO W/ CAD
8 series · 8 of 24 positions shown · non-contrast
Comparison: Previous exam(s).

CLINICAL DATA: Screening.

EXAM:
DIGITAL SCREENING BILATERAL MAMMOGRAM WITH TOMO AND CAD

[R MLO synth-2D]
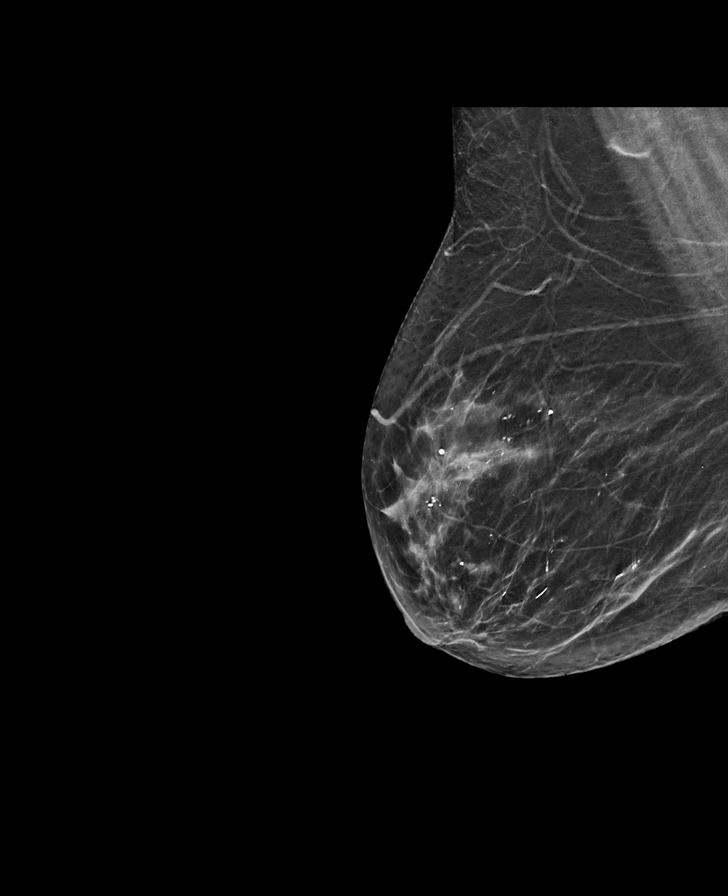

[L CC synth-2D]
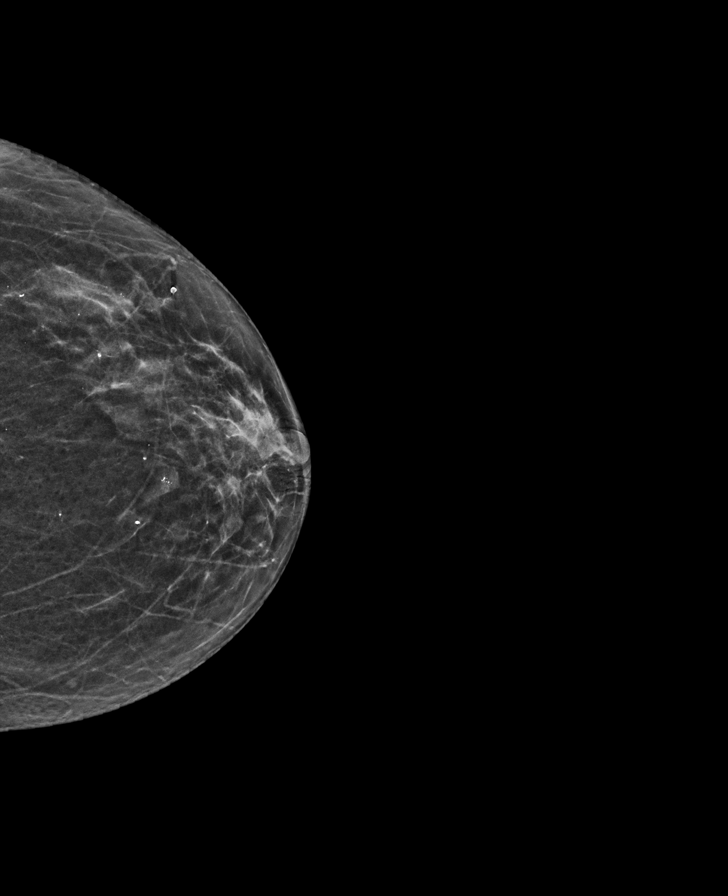

[R CC synth-2D]
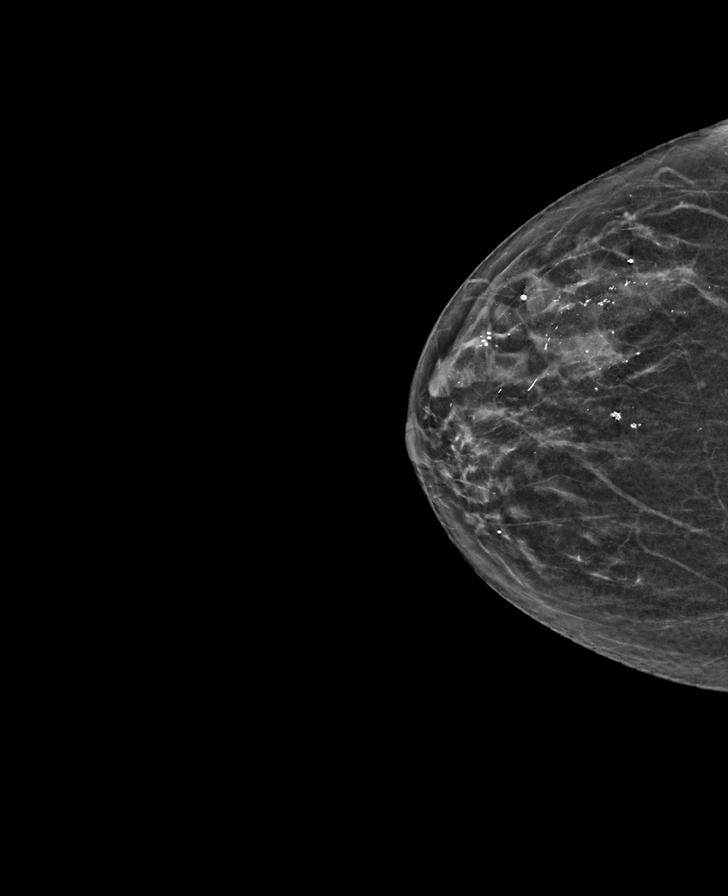

[L MLO synth-2D]
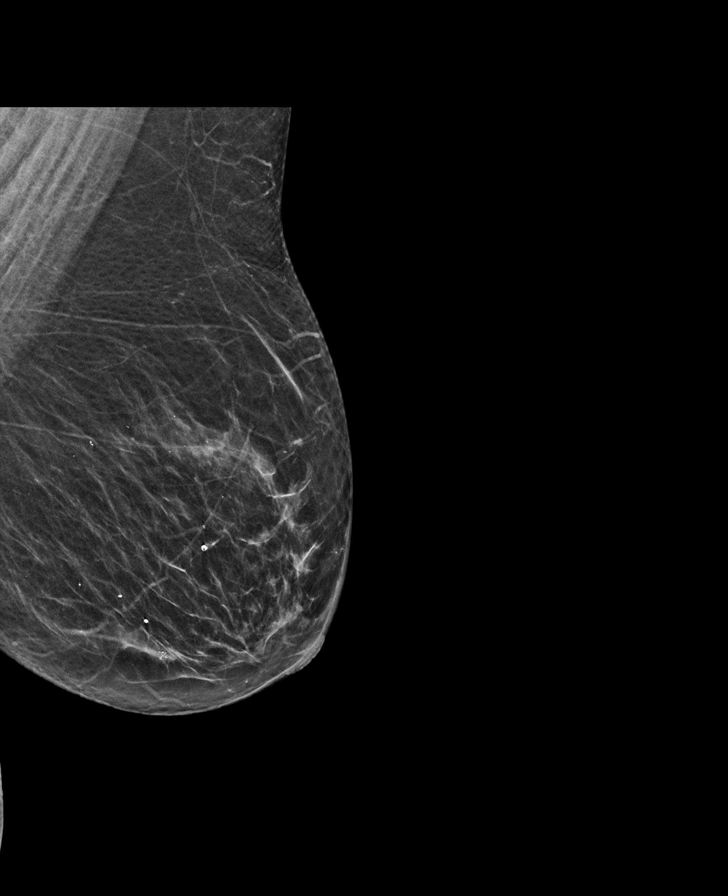

[L CC tomo · tomo slice 27/54.0]
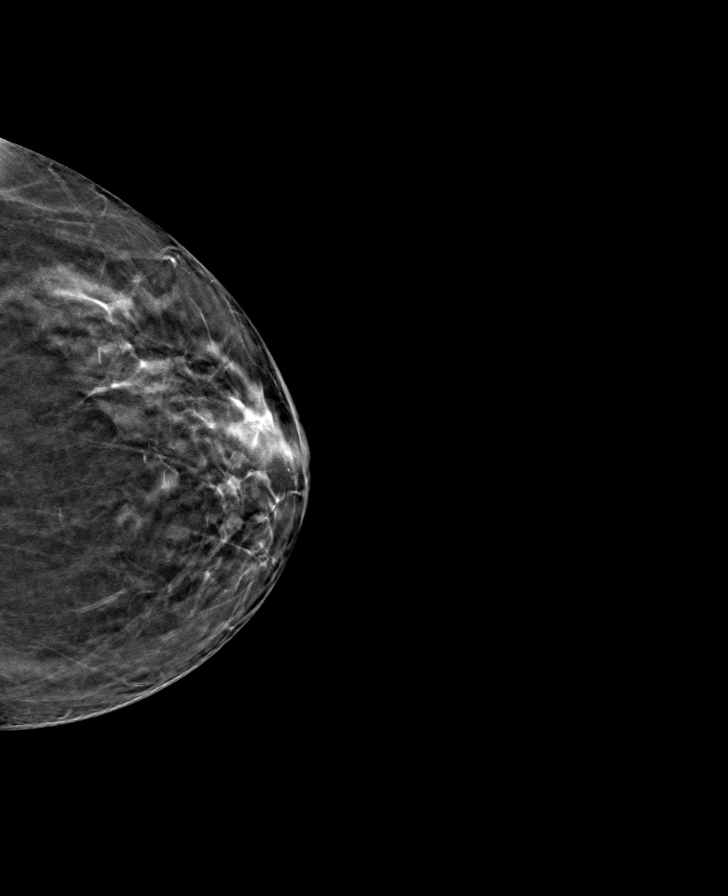

[L MLO tomo · tomo slice 30/59.0]
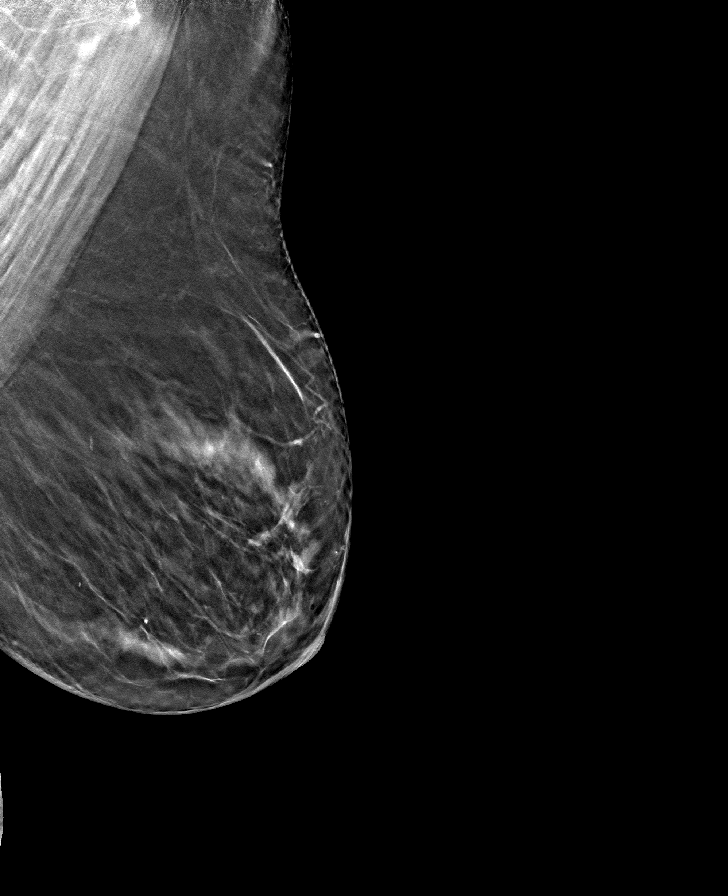

[R CC tomo · tomo slice 27/54.0]
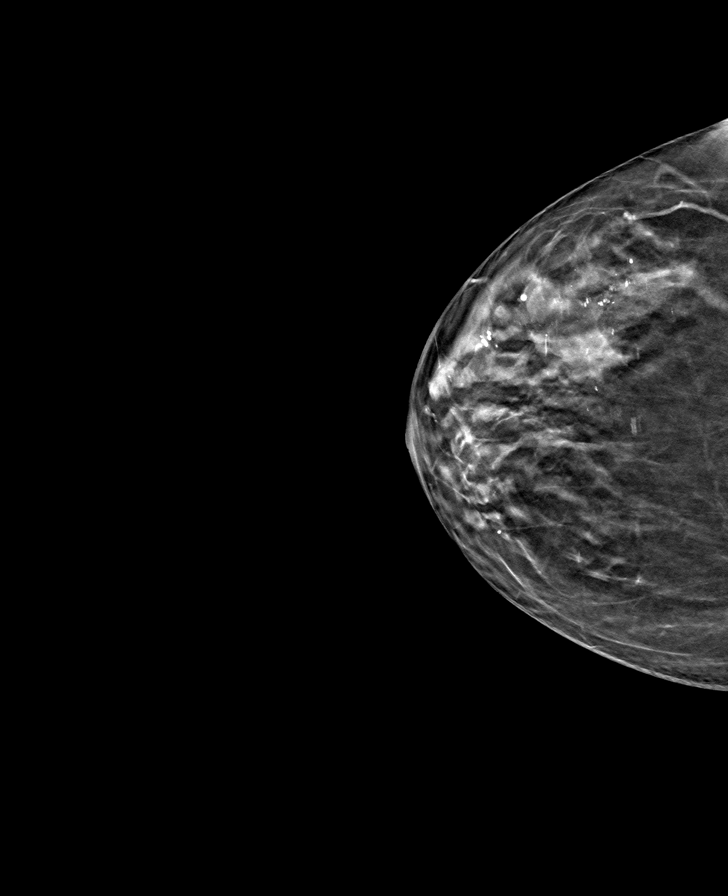

[R MLO tomo · tomo slice 33/66.0]
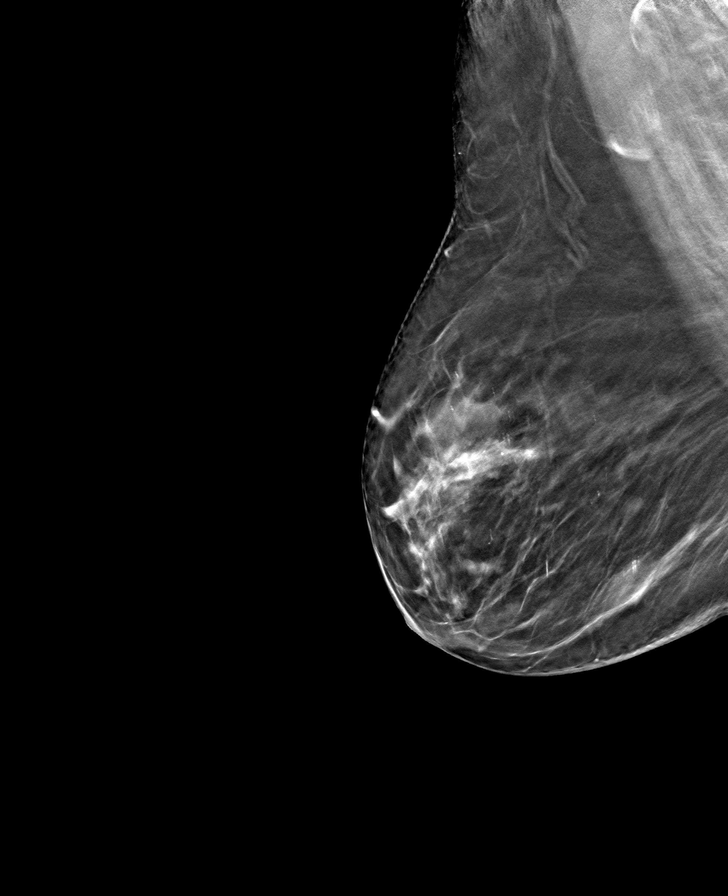

[8 of 24 positions shown; findings below may reference images not displayed]

ACR Breast Density Category b: There are scattered areas of
fibroglandular density.
FINDINGS: There are no findings suspicious for malignancy. Images were
processed with CAD.
IMPRESSION: No mammographic evidence of malignancy. A result letter of this
screening mammogram will be mailed directly to the patient.

RECOMMENDATION:
Screening mammogram in one year. (Code:[id number])

BI-RADS CATEGORY  1: Negative.

## 2021-06-26 DIAGNOSIS — J449 Chronic obstructive pulmonary disease, unspecified: Secondary | ICD-10-CM | POA: Diagnosis not present

## 2021-07-13 ENCOUNTER — Telehealth: Payer: Self-pay | Admitting: Emergency Medicine

## 2021-07-13 DIAGNOSIS — E039 Hypothyroidism, unspecified: Secondary | ICD-10-CM | POA: Diagnosis not present

## 2021-07-13 DIAGNOSIS — E1121 Type 2 diabetes mellitus with diabetic nephropathy: Secondary | ICD-10-CM | POA: Diagnosis not present

## 2021-07-13 DIAGNOSIS — N183 Chronic kidney disease, stage 3 unspecified: Secondary | ICD-10-CM | POA: Diagnosis not present

## 2021-07-13 DIAGNOSIS — I10 Essential (primary) hypertension: Secondary | ICD-10-CM | POA: Diagnosis not present

## 2021-07-13 DIAGNOSIS — E785 Hyperlipidemia, unspecified: Secondary | ICD-10-CM | POA: Diagnosis not present

## 2021-07-13 DIAGNOSIS — G4700 Insomnia, unspecified: Secondary | ICD-10-CM | POA: Diagnosis not present

## 2021-07-13 DIAGNOSIS — J441 Chronic obstructive pulmonary disease with (acute) exacerbation: Secondary | ICD-10-CM | POA: Diagnosis not present

## 2021-07-13 DIAGNOSIS — F320 Major depressive disorder, single episode, mild: Secondary | ICD-10-CM | POA: Diagnosis not present

## 2021-07-14 NOTE — Telephone Encounter (Signed)
Marchelle Folks, have you received any pt assistance form on this pt from [employer]? Thanks!

## 2021-07-20 ENCOUNTER — Encounter: Payer: Self-pay | Admitting: Hematology and Oncology

## 2021-07-22 ENCOUNTER — Other Ambulatory Visit: Payer: Self-pay

## 2021-07-22 MED ORDER — BREZTRI AEROSPHERE 160-9-4.8 MCG/ACT IN AERO: 2.0000 | INHALATION_SPRAY | Freq: Two times a day (BID) | RESPIRATORY_TRACT | 11 refills | Status: DC

## 2021-07-26 DIAGNOSIS — J449 Chronic obstructive pulmonary disease, unspecified: Secondary | ICD-10-CM | POA: Diagnosis not present

## 2021-07-27 ENCOUNTER — Ambulatory Visit (INDEPENDENT_AMBULATORY_CARE_PROVIDER_SITE_OTHER): Payer: PPO | Admitting: Cardiovascular Disease

## 2021-07-27 ENCOUNTER — Encounter (HOSPITAL_BASED_OUTPATIENT_CLINIC_OR_DEPARTMENT_OTHER): Payer: Self-pay | Admitting: Cardiovascular Disease

## 2021-07-27 VITALS — BP 112/68 | HR 94 | Ht 60.0 in | Wt 135.4 lb

## 2021-07-27 DIAGNOSIS — I10 Essential (primary) hypertension: Secondary | ICD-10-CM

## 2021-07-27 DIAGNOSIS — I2510 Atherosclerotic heart disease of native coronary artery without angina pectoris: Secondary | ICD-10-CM

## 2021-07-27 NOTE — Assessment & Plan Note (Addendum)
Blood pressure has been very well-controlled.  Continue metoprolol and losartan.

## 2021-07-27 NOTE — Assessment & Plan Note (Signed)
She has no angina.  Continue atorvastatin.  Her LDL goal is less than 70.  She has nonobstructive CAD.  Check fasting lipids/CMP.

## 2021-07-27 NOTE — Progress Notes (Signed)
Cardiology Office Note   Date:  07/27/2021   ID:  [name], [name] 1945-12-06, MRN [id number]  PCP:  Shon Hale, MD  Cardiologist:   Chilton Si, MD   No chief complaint on file.    History of Present Illness: [name] [name] is a 76 y.o. female with moderate non-obstructive CAD, hypertension, hyperlipidemia, GERD, and COPD here for follow up. She was initially seen for the evaluation of chest tightness.  Her symptoms began 10/2016.  She saw Dr. Delton Coombes and reported shortness of breath and wheezing.  She was started on prednisone and Azithromycin.  She was seen at [facility] clinic 11/2016 due to chest tightness.  EKG revealed sinus rhythm and no ischemic changes.  There was concern that this was still a COPD exacerbation and prednisone was resumed.  She was referred for coronary CT-A 02/06/2017 that revealed moderate, diffuse calcification and atherosclerosis predominantly in the descending thoracic aorta.  She also had 25 to 50% left main disease with 25%-50% LAD disease with concern for an intramyocardial bridge.  There was also a greater than 70% left circumflex disease.  She was referred for left heart catheterization 02/09/2017 that revealed 10 to 40% nonobstructive disease.  No left main stenosis was noted.  She is on warfarin chronically for prior DVT/PE.  She was previously on Xarelto but was unable to afford it.  She is not getting much exercise due to her underlying lung disease.     Past Medical History:  Diagnosis Date   Allergic rhinitis, cause unspecified    CAD in native artery 07/25/2017   Chronic airway obstruction, not elsewhere classified    COPD (chronic obstructive pulmonary disease) (HCC)    Depressive disorder, not elsewhere classified    Nephritis and nephropathy, not specified as acute or chronic, with unspecified pathological lesion in kidney    Obesity, unspecified    Other and unspecified hyperlipidemia    Oxygen deficiency     Thyrotoxicosis without mention of goiter or other cause, without mention of thyrotoxic crisis or storm    Unspecified essential hypertension    UTI (urinary tract infection) 2019 dec    Past Surgical History:  Procedure Laterality Date   BIOPSY  01/19/2021   Procedure: BIOPSY;  Surgeon: Shellia Cleverly, DO;  Location: [facility];  Service: Gastroenterology;;   CHOLECYSTECTOMY     ESOPHAGOGASTRODUODENOSCOPY (EGD) WITH PROPOFOL N/A 01/19/2021   Procedure: ESOPHAGOGASTRODUODENOSCOPY (EGD) WITH PROPOFOL;  Surgeon: Shellia Cleverly, DO;  Location: [facility];  Service: Gastroenterology;  Laterality: N/A;   LEFT HEART CATH AND CORONARY ANGIOGRAPHY N/A 03/01/2017   Procedure: LEFT HEART CATH AND CORONARY ANGIOGRAPHY;  Surgeon: Yvonne Kendall, MD;  Location: [facility];  Service: Cardiovascular;  Laterality: N/A;     Current Outpatient Medications  Medication Sig Dispense Refill   albuterol (PROVENTIL) (2.5 MG/3ML) 0.083% nebulizer solution Take 3 mLs (2.5 mg total) by nebulization every 4 (four) hours as needed for wheezing or shortness of breath (DX: COPD 496). 150 mL 12   Ascorbic Acid (VITAMIN C) 1000 MG tablet Take 1,000 mg by mouth daily.     atorvastatin (LIPITOR) 20 MG tablet Take 2 tablets (40 mg total) by mouth daily. Please schedule appt for future refills. 1st attempt 180 tablet 0   Budeson-Glycopyrrol-Formoterol (BREZTRI AEROSPHERE) 160-9-4.8 MCG/ACT AERO Inhale 2 puffs into the lungs in the morning and at bedtime. 10.7 g 11   Calcium Carb-Cholecalciferol (CALCIUM 600+D3 PO) Take 1 tablet by mouth daily.  Cholecalciferol (VITAMIN D-3 PO) Take 1 tablet by mouth daily.     ferrous sulfate 325 (65 FE) MG tablet Take 325 mg by mouth daily.     fexofenadine (ALLEGRA) 180 MG tablet Take 180 mg by mouth daily.     fluticasone (FLONASE) 50 MCG/ACT nasal spray SPRAY 2 SPRAYS INTO EACH NOSTRIL EVERY DAY (Patient taking differently: 1 spray daily.) 48 mL 1   levothyroxine  (SYNTHROID) 75 MCG tablet Take 1 tablet by mouth daily.     losartan (COZAAR) 25 MG tablet Take 25 mg by mouth daily.     metFORMIN (GLUCOPHAGE-XR) 500 MG 24 hr tablet Take 500 mg by mouth every evening.     metoprolol tartrate (LOPRESSOR) 50 MG tablet TAKE 1 TABLET BY MOUTH TWICE A DAY 180 tablet 3   Multiple Vitamins-Minerals (CENTRUM PO) Take 1 tablet by mouth daily.     ONE TOUCH ULTRA TEST test strip As directed     pantoprazole (PROTONIX) 40 MG tablet TAKE 1 TABLET BY MOUTH TWICE A DAY 180 tablet 1   PROVENTIL HFA 108 (90 Base) MCG/ACT inhaler INHALE 2 PUFFS BY MOUTH INTO LUNGS EVERY SIX HOURS AS NEEDED FOR WHEEZING OR SHORTNESS OF BREATH (Patient taking differently: Inhale 2 puffs into the lungs every 6 (six) hours as needed for wheezing or shortness of breath.) 18 g 3   traZODone (DESYREL) 50 MG tablet Take 50 mg by mouth at bedtime.      venlafaxine XR (EFFEXOR-XR) 150 MG 24 hr capsule Take 150 mg by mouth daily.      nitroGLYCERIN (NITROSTAT) 0.4 MG SL tablet Place 1 tablet (0.4 mg total) under the tongue every 5 (five) minutes as needed for chest pain. 25 tablet 4   No current facility-administered medications for this visit.    Allergies:   Lisinopril and Sulfonamide derivatives    Social History:  The patient  reports that she quit smoking about 27 years ago. Her smoking use included cigarettes. She started smoking about 61 years ago. She has a 60.00 pack-year smoking history. She has never used smokeless tobacco. She reports that she does not drink alcohol and does not use drugs.   Family History:  The patient's family history includes CAD in her mother; Cancer in her brother; Diabetes in her brother and mother; Heart failure in her mother; Hyperlipidemia in her mother; Hypertension in her mother; Stroke in her maternal grandfather.    ROS:  Please see the history of present illness.   Otherwise, review of systems are positive for none.   All other systems are reviewed and  negative.    PHYSICAL EXAM: VS:  BP 112/68 (BP Location: Left Arm, Patient Position: Sitting, Cuff Size: Normal)   Pulse 94   Ht 5' (1.524 m)   Wt 135 lb 6.4 oz (61.4 kg)   BMI 26.44 kg/m  , BMI Body mass index is 26.44 kg/m. GENERAL:  Well appearing.  No acute distress. HEENT:  Pupils equal round and reactive, fundi not visualized, oral mucosa unremarkable NECK:  No jugular venous distention, waveform within normal limits, carotid upstroke brisk and symmetric, no bruits, no thyromegaly LUNGS:  Mild expiratory wheezing diffusely.  Poor air movement. HEART: Tachycardic.  Regular rhythm.  PMI not displaced or sustained,S1 and S2 within normal limits, no S3, no S4, no clicks, no rubs, no murmurs ABD:  Flat, positive bowel sounds normal in frequency in pitch, no bruits, no rebound, no guarding, no midline pulsatile mass, no hepatomegaly, no splenomegaly EXT:  2 plus pulses throughout, no edema, no cyanosis no clubbing SKIN:  No rashes no nodules NEURO:  Cranial nerves II through XII grossly intact, motor grossly intact throughout PSYCH:  Cognitively intact, oriented to person place and time   EKG:  EKG is ordered today. 11/20/16: Sinus rhythm.  Rate 90 bpm. 12/09/18: Sinus tachycardia.  Rate 117 bpm.  07/27/21: Sinus rhythm.  Rate 94 bpm.  Low voltage.   Recent Labs: No results found for requested labs within last 365 days.    06/20/16: Sodium 141, potassium 4.2, BUN 10, creatinine 0.95 AST 25, ALT 23 Hemoglobin A1c 6.7% Total cholesterol 130, triglycerides 158 HDL 43 LDL 55 TSH 0.96  07/03/2017: Total cholesterol 140, triglycerides 243, HDL 36, LDL 56 Exline hemoglobin A1c 7.4% Hemoglobin 12.5 Potassium 4.5, creatinine 0.93 TSH 2.17   Lipid Panel No results found for: "CHOL", "TRIG", "HDL", "CHOLHDL", "VLDL", "LDLCALC", "LDLDIRECT"    Wt Readings from Last 3 Encounters:  07/27/21 135 lb 6.4 oz (61.4 kg)  05/11/21 134 lb 12.8 oz (61.1 kg)  01/19/21 140 lb (63.5 kg)       ASSESSMENT AND PLAN:  Essential hypertension Blood pressure has been very well-controlled.  Continue metoprolol and losartan.  CAD in native artery She has no angina.  Continue atorvastatin.  Her LDL goal is less than 70.  She has nonobstructive CAD.  Check fasting lipids/CMP.  Current medicines are reviewed at length with the patient today.  The patient does not have concerns regarding medicines.  The following changes have been made:  no change  Labs/ tests ordered today include:   Orders Placed This Encounter  Procedures   Comprehensive metabolic panel   Lipid panel   EKG 12-Lead     Disposition:   FU with Otto Felkins C. Duke Salvia, MD, [facility] in one year.     Signed, Stephen Baruch C. Duke Salvia, MD, [facility]  07/27/2021 5:49 PM    [facility]

## 2021-07-27 NOTE — Patient Instructions (Signed)
Medication Instructions:  Your physician recommends that you continue on your current medications as directed. Please refer to the Current Medication list given to you today.   *If you need a refill on your cardiac medications before your next appointment, please call your pharmacy*  Lab Work: FASTING LP/CMET SOON   If you have labs (blood work) drawn today and your tests are completely normal, you will receive your results only by: MyChart Message (if you have MyChart) OR A paper copy in the mail If you have any lab test that is abnormal or we need to change your treatment, we will call you to review the results.  Testing/Procedures: NONE  Follow-Up: At [facility], you and your health needs are our priority.  As part of our continuing mission to provide you with exceptional heart care, we have created designated Provider Care Teams.  These Care Teams include your primary Cardiologist (physician) and Advanced Practice Providers (APPs -  Physician Assistants and Nurse Practitioners) who all work together to provide you with the care you need, when you need it.  We recommend signing up for the patient portal called "MyChart".  Sign up information is provided on this After Visit Summary.  MyChart is used to connect with patients for Virtual Visits (Telemedicine).  Patients are able to view lab/test results, encounter notes, upcoming appointments, etc.  Non-urgent messages can be sent to your provider as well.   To learn more about what you can do with MyChart, go to [email or web address].    Your next appointment:   12 month(s)  The format for your next appointment:   In Person  Provider:   Tiffany Montrose, MD    

## 2021-07-27 NOTE — Telephone Encounter (Signed)
[place] along with signed Rx faxed.

## 2021-07-28 ENCOUNTER — Telehealth: Payer: Self-pay | Admitting: Emergency Medicine

## 2021-07-28 DIAGNOSIS — I2510 Atherosclerotic heart disease of native coronary artery without angina pectoris: Secondary | ICD-10-CM | POA: Diagnosis not present

## 2021-07-28 DIAGNOSIS — I10 Essential (primary) hypertension: Secondary | ICD-10-CM | POA: Diagnosis not present

## 2021-07-28 LAB — COMPREHENSIVE METABOLIC PANEL
ALT: 6 IU/L (ref 0–32)
AST: 11 IU/L (ref 0–40)
Albumin/Globulin Ratio: 2 (ref 1.2–2.2)
Albumin: 4.3 g/dL (ref 3.8–4.8)
Alkaline Phosphatase: 44 IU/L (ref 44–121)
BUN/Creatinine Ratio: 16 (ref 12–28)
BUN: 15 mg/dL (ref 8–27)
Bilirubin Total: 0.5 mg/dL (ref 0.0–1.2)
CO2: 34 mmol/L — ABNORMAL HIGH (ref 20–29)
Calcium: 9.6 mg/dL (ref 8.7–10.3)
Chloride: 98 mmol/L (ref 96–106)
Creatinine, Ser: 0.92 mg/dL (ref 0.57–1.00)
Globulin, Total: 2.2 g/dL (ref 1.5–4.5)
Glucose: 109 mg/dL — ABNORMAL HIGH (ref 70–99)
Potassium: 4.1 mmol/L (ref 3.5–5.2)
Sodium: 146 mmol/L — ABNORMAL HIGH (ref 134–144)
Total Protein: 6.5 g/dL (ref 6.0–8.5)
eGFR: 65 mL/min/{1.73_m2} (ref >59)

## 2021-07-28 LAB — LIPID PANEL
Chol/HDL Ratio: 3.8 ratio (ref 0.0–4.4)
Cholesterol, Total: 185 mg/dL (ref 100–199)
HDL: 49 mg/dL (ref >39)
LDL Chol Calc (NIH): 96 mg/dL (ref 0–99)
Triglycerides: 234 mg/dL — ABNORMAL HIGH (ref 0–149)
VLDL Cholesterol Cal: 40 mg/dL (ref 5–40)

## 2021-07-28 NOTE — Telephone Encounter (Signed)
Spoke with pt who states she is needing a POC but current DME is having problems with getting her one. Pt wanted to switch DMEs but I told her that we would need to schedule appt for qualifying walk and OV but next avail is not until 8/3 which pt states is when she is going out of town. Pt stated she would call DME back to see if they can help her out. Nothing further needed.

## 2021-08-04 ENCOUNTER — Telehealth: Payer: Self-pay | Admitting: Emergency Medicine

## 2021-08-04 MED ORDER — ALBUTEROL SULFATE HFA 108 (90 BASE) MCG/ACT IN AERS: 2.0000 | INHALATION_SPRAY | Freq: Four times a day (QID) | RESPIRATORY_TRACT | 6 refills | Status: DC | PRN

## 2021-08-04 MED ORDER — PREDNISONE 10 MG PO TABS: 20.0000 mg | ORAL_TABLET | Freq: Every day | ORAL | 0 refills | Status: DC

## 2021-08-04 NOTE — Telephone Encounter (Signed)
Please send script for substitute albuterol that is covered by her insurance.  OK to give her a script for prednisone 20mg  tablets, #5

## 2021-08-04 NOTE — Telephone Encounter (Signed)
Called patient and she states that she received a letter from [facility] stating her Albuterol inhaler is recalled.   She would like prednisone called in for the fact that she is going out of town for a week and wants it as a back up just in case.   Please advise sir

## 2021-08-04 NOTE — Telephone Encounter (Signed)
Called and spoke with patient. She verbalized understanding. RXs have been sent in.   Nothing further needed at time of call.  

## 2021-08-09 ENCOUNTER — Telehealth (HOSPITAL_BASED_OUTPATIENT_CLINIC_OR_DEPARTMENT_OTHER): Payer: Self-pay | Admitting: *Deleted

## 2021-08-09 DIAGNOSIS — I10 Essential (primary) hypertension: Secondary | ICD-10-CM

## 2021-08-09 DIAGNOSIS — Z5181 Encounter for therapeutic drug level monitoring: Secondary | ICD-10-CM

## 2021-08-09 DIAGNOSIS — E7800 Pure hypercholesterolemia, unspecified: Secondary | ICD-10-CM

## 2021-08-09 MED ORDER — ATORVASTATIN CALCIUM 80 MG PO TABS: 80.0000 mg | ORAL_TABLET | Freq: Every day | ORAL | 1 refills | Status: DC

## 2021-08-09 NOTE — Telephone Encounter (Signed)
-----   Message from Chilton Si, MD sent at 08/08/2021  3:00 PM EDT ----- Normal kidney and liver function.  Sodium level is a little bit high indicating that she might be a little dry.  Make sure to drink plenty of fluids.  Cholesterol levels are little bit high.  Recommend increasing atorvastatin to 80 mg and repeating lipids and a CMP in 2 to 3 months.

## 2021-08-09 NOTE — Telephone Encounter (Signed)
Advised patient of lab resultsa and mailed lab orders

## 2021-08-25 ENCOUNTER — Other Ambulatory Visit: Payer: Self-pay | Admitting: Emergency Medicine

## 2021-08-31 NOTE — Telephone Encounter (Signed)
ACT lmtcb to schedule appt for qualifying walk and DME change

## 2021-09-01 ENCOUNTER — Encounter: Payer: Self-pay | Admitting: Hematology and Oncology

## 2021-09-07 ENCOUNTER — Other Ambulatory Visit: Payer: Self-pay | Admitting: Emergency Medicine

## 2021-09-08 ENCOUNTER — Ambulatory Visit: Payer: HMO | Admitting: Adult Health

## 2021-09-08 ENCOUNTER — Encounter: Payer: Self-pay | Admitting: Adult Health

## 2021-09-08 VITALS — BP 118/68 | HR 83 | Temp 98.0°F | Ht 60.0 in | Wt 134.0 lb

## 2021-09-08 DIAGNOSIS — J441 Chronic obstructive pulmonary disease with (acute) exacerbation: Secondary | ICD-10-CM | POA: Diagnosis not present

## 2021-09-08 DIAGNOSIS — J9611 Chronic respiratory failure with hypoxia: Secondary | ICD-10-CM | POA: Diagnosis not present

## 2021-09-08 MED ORDER — AZITHROMYCIN 250 MG PO TABS: ORAL_TABLET | ORAL | 0 refills | Status: AC

## 2021-09-08 MED ORDER — PREDNISONE 10 MG PO TABS: ORAL_TABLET | ORAL | 0 refills | Status: DC

## 2021-09-08 MED ORDER — BREZTRI AEROSPHERE 160-9-4.8 MCG/ACT IN AERO: 2.0000 | INHALATION_SPRAY | Freq: Two times a day (BID) | RESPIRATORY_TRACT | 0 refills | Status: DC

## 2021-09-08 NOTE — Patient Instructions (Signed)
ZPack take asa directed.  Prednisone taper over next wek.  Mucinex DM twice daily as needed for cough and congestion BREZTRI 2 puffs twice daily, rinse after use Albuterol inhaler or nebulizer as needed Advance activity as tolerated Continue on Oxygen 4l/m pulsed with activity and 2l/m At bedtime   We will contact [facility] regarding your POC.  Follow-up in 3 months with Dr. Delton Coombes and as needed Please contact office for sooner follow up if symptoms do not improve or worsen or seek emergency care

## 2021-09-08 NOTE — Assessment & Plan Note (Signed)
Continue on oxygen to maintain O2 saturations greater than 88 to 90%.  Patient wishes to switch DME companies order has been placed.  Plan  . Patient Instructions  ZPack take asa directed.  Prednisone taper over next wek.  Mucinex DM twice daily as needed for cough and congestion BREZTRI 2 puffs twice daily, rinse after use Albuterol inhaler or nebulizer as needed Advance activity as tolerated Continue on Oxygen 4l/m pulsed with activity and 2l/m At bedtime   We will contact [facility] regarding your POC.  Follow-up in 3 months with Dr. Delton Coombes and as needed Please contact office for sooner follow up if symptoms do not improve or worsen or seek emergency care

## 2021-09-08 NOTE — Assessment & Plan Note (Signed)
COPD exacerbation-we will treat with empiric course of antibiotics and steroids. Plan  Patient Instructions  ZPack take asa directed.  Prednisone taper over next wek.  Mucinex DM twice daily as needed for cough and congestion BREZTRI 2 puffs twice daily, rinse after use Albuterol inhaler or nebulizer as needed Advance activity as tolerated Continue on Oxygen 4l/m pulsed with activity and 2l/m At bedtime   We will contact [facility] regarding your POC.  Follow-up in 3 months with Dr. Delton Coombes and as needed Please contact office for sooner follow up if symptoms do not improve or worsen or seek emergency care

## 2021-09-08 NOTE — Progress Notes (Signed)
@Patient  ID: [name] [name], female    DOB: 31-Dec-1945, 76 y.o.   MRN: [id number]  Chief Complaint  Patient presents with   Follow-up    Referring provider: Glenis Smoker, *  HPI: 76 year old female former smoker followed for moderate COPD on chronic respiratory failure on oxygen COVID-19 infection in 12/2019    TEST/EVENTS :    09/08/2021 Follow up : COPD , O2 RF  Patient presents for a 65-month follow-up.  She has moderate COPD, oxygen dependent respiratory failure.  She is maintained on Breztri twice daily.  Patient says that over the last few weeks she has noticed that her breathing has not been doing as well.  She is got increased cough congestion with thick mucus intermittent wheezing and shortness of breath.  Activity tolerance has not been as well.  She denies any hemoptysis, chest pain, orthopnea, edema. Patient is very frustrated with her DME company.  She currently gets her oxygen from [employer].  Says that her POC oxygen device which she uses all the time and helps her to be more mobile and independent.  Has not been working and she is taken in several times for repairs.  She is very frustrated she cannot carry heavy oxygen tanks.  She is requesting to change her oxygen to a different DME.  She says she has been on oxygen for greater than 10 years.  Patient walk test today in office shows O2 saturations at 82% on room air.  Required 4 L pulsing oxygen on POC device to maintain O2 saturations greater than 88 to 9%.  Patient says she greatly benefits from pulsed oxygen device.  Patient still drives and does her house chores.  She says she cannot live without it..   Allergies  Allergen Reactions   Lisinopril Cough    cough   Sulfonamide Derivatives Rash    Immunization History  Administered Date(s) Administered   Fluad Quad(high Dose 65+) 10/08/2020   Influenza Split 11/29/2010, 10/07/2016, 10/15/2017   Influenza Whole 12/09/2008, 10/09/2009,  10/02/2010, 09/10/2011   Influenza, High Dose Seasonal PF 10/07/2016, 10/15/2017, 08/22/2018   Influenza,inj,Quad PF,6+ Mos 10/01/2012, 10/08/2013, 09/30/2014, 10/11/2015   Influenza,inj,quad, With Preservative 10/08/2014   PFIZER(Purple Top)SARS-COV-2 Vaccination 02/13/2019, 03/06/2019   Pneumococcal Conjugate-13 10/09/2013, 11/20/2013   Pneumococcal Polysaccharide-23 06/21/2001, 09/14/2010   Pneumococcal-Unspecified 10/02/2010   Td 06/21/2001   Tdap 09/14/2010   Zoster Recombinat (Shingrix) 02/27/2018, 07/01/2018   Zoster, Live 05/01/2007    Past Medical History:  Diagnosis Date   Allergic rhinitis, cause unspecified    CAD in native artery 07/25/2017   Chronic airway obstruction, not elsewhere classified    COPD (chronic obstructive pulmonary disease) (HCC)    Depressive disorder, not elsewhere classified    Nephritis and nephropathy, not specified as acute or chronic, with unspecified pathological lesion in kidney    Obesity, unspecified    Other and unspecified hyperlipidemia    Oxygen deficiency    Thyrotoxicosis without mention of goiter or other cause, without mention of thyrotoxic crisis or storm    Unspecified essential hypertension    UTI (urinary tract infection) 2019 dec    Tobacco History: Social History   Tobacco Use  Smoking Status Former   Packs/day: 2.00   Years: 30.00   Total pack years: 60.00   Types: Cigarettes   Start date: 01/10/1960   Quit date: 01/09/1994   Years since quitting: 27.6  Smokeless Tobacco Never   Counseling given: Not Answered   Outpatient Medications Prior to  Visit  Medication Sig Dispense Refill   albuterol (PROVENTIL) (2.5 MG/3ML) 0.083% nebulizer solution Take 3 mLs (2.5 mg total) by nebulization every 4 (four) hours as needed for wheezing or shortness of breath (DX: COPD 496). 150 mL 12   albuterol (VENTOLIN HFA) 108 (90 Base) MCG/ACT inhaler Inhale 2 puffs into the lungs every 6 (six) hours as needed for wheezing or shortness  of breath. 8 g 6   Ascorbic Acid (VITAMIN C) 1000 MG tablet Take 1,000 mg by mouth daily.     atorvastatin (LIPITOR) 80 MG tablet Take 1 tablet (80 mg total) by mouth daily. 90 tablet 1   Budeson-Glycopyrrol-Formoterol (BREZTRI AEROSPHERE) 160-9-4.8 MCG/ACT AERO Inhale 2 puffs into the lungs in the morning and at bedtime. 10.7 g 11   Calcium Carb-Cholecalciferol (CALCIUM 600+D3 PO) Take 1 tablet by mouth daily.     Cholecalciferol (VITAMIN D-3 PO) Take 1 tablet by mouth daily.     ferrous sulfate 325 (65 FE) MG tablet Take 325 mg by mouth daily.     fexofenadine (ALLEGRA) 180 MG tablet Take 180 mg by mouth daily.     fluticasone (FLONASE) 50 MCG/ACT nasal spray SPRAY 2 SPRAYS INTO EACH NOSTRIL EVERY DAY (Patient taking differently: 1 spray daily.) 48 mL 1   levothyroxine (SYNTHROID) 75 MCG tablet Take 1 tablet by mouth daily.     losartan (COZAAR) 25 MG tablet Take 25 mg by mouth daily.     metFORMIN (GLUCOPHAGE-XR) 500 MG 24 hr tablet Take 500 mg by mouth every evening.     metoprolol tartrate (LOPRESSOR) 50 MG tablet TAKE 1 TABLET BY MOUTH TWICE A DAY 180 tablet 3   Multiple Vitamins-Minerals (CENTRUM PO) Take 1 tablet by mouth daily.     ONE TOUCH ULTRA TEST test strip As directed     pantoprazole (PROTONIX) 40 MG tablet TAKE 1 TABLET BY MOUTH TWICE A DAY 180 tablet 1   PROVENTIL HFA 108 (90 Base) MCG/ACT inhaler INHALE 2 PUFFS BY MOUTH INTO LUNGS EVERY SIX HOURS AS NEEDED FOR WHEEZING OR SHORTNESS OF BREATH (Patient taking differently: Inhale 2 puffs into the lungs every 6 (six) hours as needed for wheezing or shortness of breath.) 18 g 3   traZODone (DESYREL) 50 MG tablet Take 50 mg by mouth at bedtime.      venlafaxine XR (EFFEXOR-XR) 150 MG 24 hr capsule Take 150 mg by mouth daily.      nitroGLYCERIN (NITROSTAT) 0.4 MG SL tablet Place 1 tablet (0.4 mg total) under the tongue every 5 (five) minutes as needed for chest pain. 25 tablet 4   predniSONE (DELTASONE) 10 MG tablet Take 2 tablets  (20 mg total) by mouth daily with breakfast. (Patient not taking: Reported on 09/08/2021) 10 tablet 0   No facility-administered medications prior to visit.     Review of Systems:   Constitutional:   No  weight loss, night sweats,  Fevers, chills,  +fatigue, or  lassitude.  HEENT:   No headaches,  Difficulty swallowing,  Tooth/dental problems, or  Sore throat,                No sneezing, itching, ear ache, nasal congestion, post nasal drip,   CV:  No chest pain,  Orthopnea, PND, swelling in lower extremities, anasarca, dizziness, palpitations, syncope.   GI  No heartburn, indigestion, abdominal pain, nausea, vomiting, diarrhea, change in bowel habits, loss of appetite, bloody stools.   Resp: .  No chest wall deformity  Skin: no rash  or lesions.  GU: no dysuria, change in color of urine, no urgency or frequency.  No flank pain, no hematuria   MS:  No joint pain or swelling.  No decreased range of motion.  No back pain.    Physical Exam  BP 118/68 (BP Location: Left Arm, Cuff Size: Normal)   Pulse 83   Temp 98 F (36.7 C)   Ht 5' (1.524 m)   Wt 134 lb (60.8 kg)   SpO2 95%   BMI 26.17 kg/m   GEN: A/Ox3; pleasant , NAD, elderly on O2    HEENT:  Parkin/AT, NOSE-clear, THROAT-clear, no lesions, no postnasal drip or exudate noted.   NECK:  Supple w/ fair ROM; no JVD; normal carotid impulses w/o bruits; no thyromegaly or nodules palpated; no lymphadenopathy.    RESP  Clear  P & A; w/o, wheezes/ rales/ or rhonchi. no accessory muscle use, no dullness to percussion  CARD:  RRR, no m/r/g, no peripheral edema, pulses intact, no cyanosis or clubbing.  GI:   Soft & nt; nml bowel sounds; no organomegaly or masses detected.   Musco: Warm bil, no deformities or joint swelling noted.   Neuro: alert, no focal deficits noted.    Skin: Warm, no lesions or rashes    Lab Results:  CBC     Imaging: No results found.        No data to display          No results found  for: "NITRICOXIDE"      Assessment & Plan:   COPD (chronic obstructive pulmonary disease) (HCC) COPD exacerbation-we will treat with empiric course of antibiotics and steroids. Plan  Patient Instructions  ZPack take asa directed.  Prednisone taper over next wek.  Mucinex DM twice daily as needed for cough and congestion BREZTRI 2 puffs twice daily, rinse after use Albuterol inhaler or nebulizer as needed Advance activity as tolerated Continue on Oxygen 4l/m pulsed with activity and 2l/m At bedtime   We will contact [facility] regarding your POC.  Follow-up in 3 months with Dr. Delton Coombes and as needed Please contact office for sooner follow up if symptoms do not improve or worsen or seek emergency care      Chronic respiratory failure with hypoxia (HCC) Continue on oxygen to maintain O2 saturations greater than 88 to 90%.  Patient wishes to switch DME companies order has been placed.  Plan  . Patient Instructions  ZPack take asa directed.  Prednisone taper over next wek.  Mucinex DM twice daily as needed for cough and congestion BREZTRI 2 puffs twice daily, rinse after use Albuterol inhaler or nebulizer as needed Advance activity as tolerated Continue on Oxygen 4l/m pulsed with activity and 2l/m At bedtime   We will contact [facility] regarding your POC.  Follow-up in 3 months with Dr. Delton Coombes and as needed Please contact office for sooner follow up if symptoms do not improve or worsen or seek emergency care        Rubye Oaks, NP 09/08/2021

## 2021-09-16 ENCOUNTER — Telehealth: Payer: Self-pay | Admitting: Emergency Medicine

## 2021-09-19 DIAGNOSIS — J449 Chronic obstructive pulmonary disease, unspecified: Secondary | ICD-10-CM | POA: Diagnosis not present

## 2021-09-19 DIAGNOSIS — I10 Essential (primary) hypertension: Secondary | ICD-10-CM | POA: Diagnosis not present

## 2021-09-19 DIAGNOSIS — I2510 Atherosclerotic heart disease of native coronary artery without angina pectoris: Secondary | ICD-10-CM | POA: Diagnosis not present

## 2021-09-19 DIAGNOSIS — E039 Hypothyroidism, unspecified: Secondary | ICD-10-CM | POA: Diagnosis not present

## 2021-09-19 DIAGNOSIS — E1121 Type 2 diabetes mellitus with diabetic nephropathy: Secondary | ICD-10-CM | POA: Diagnosis not present

## 2021-09-19 DIAGNOSIS — Z9981 Dependence on supplemental oxygen: Secondary | ICD-10-CM | POA: Diagnosis not present

## 2021-09-19 DIAGNOSIS — Z23 Encounter for immunization: Secondary | ICD-10-CM | POA: Diagnosis not present

## 2021-09-19 NOTE — Telephone Encounter (Signed)
Marchelle Folks, please follow up on this.  Thank you.

## 2021-09-21 NOTE — Telephone Encounter (Signed)
Aram Beecham called back from [place] [facility] stating the insurance portion was not filled out. Aram Beecham would like this part filled out and sent back to them attn cynthia.  Please advise.

## 2021-09-21 NOTE — Telephone Encounter (Signed)
It is the pt who fills out the first page of the AZ[employer]ME application that has the insurance portion on it. The provider office takes care of filling out the second page of the application but with the AZ&ME applications, we can also send the Rx electronically to [facility] which is [facility]'s pharmacy.   Attempted to call Aram Beecham with [facility] to state to her the above information but unable to reach. Left a message for her to return call.

## 2021-09-26 NOTE — Telephone Encounter (Signed)
Yes I have it. Will fax. Nothing further needed

## 2021-09-26 NOTE — Telephone Encounter (Signed)
Faxed Az[employer] paperwork to [place] at [place]. Nothing further needed

## 2021-09-26 NOTE — Telephone Encounter (Signed)
April, do you still have this? Caren Griffins did not receive it unfortunately.

## 2021-09-27 ENCOUNTER — Telehealth: Payer: Self-pay | Admitting: Emergency Medicine

## 2021-09-27 NOTE — Telephone Encounter (Signed)
Patient states [facility] needs an order for her oxygen. States she did a walk test a few weeks ago. [place] fax number provided by patient is [phone].  Please advise- call home number at [phone].

## 2021-09-28 ENCOUNTER — Telehealth: Payer: Self-pay | Admitting: Emergency Medicine

## 2021-09-28 NOTE — Telephone Encounter (Signed)
ATC patient to get her permission to fill out forms for [place] patient assistance paperwork for her.   [facility] did not mention anything about [place] needing to fill out paperwork for her when I spoke to them on the phone 3 times this week. Will need to get permission from patient to fill out paperwork.   Will try again

## 2021-09-28 NOTE — Telephone Encounter (Signed)
Oxygen order printed and faxed to [place]. Nothing further needed

## 2021-09-30 NOTE — Telephone Encounter (Signed)
Spoke with the pt  She states that she does need to get assistance forms for breztri filled out  I verified her address and mailed them to her, advised return once done  She verbalized understanding    She also wanted to check the status of POC order- states that she wanted this sent to [facility]. Looks like it was sent to [facility]. Please send to [facility] per pt request, thanks!

## 2021-09-30 NOTE — Telephone Encounter (Signed)
[name] [name] - will you please send this order to [place]?

## 2021-09-30 NOTE — Telephone Encounter (Signed)
Order has been sent to [facility] °

## 2021-10-04 DIAGNOSIS — J9611 Chronic respiratory failure with hypoxia: Secondary | ICD-10-CM | POA: Diagnosis not present

## 2021-10-04 NOTE — Telephone Encounter (Signed)
[facility] has the order

## 2021-10-12 ENCOUNTER — Telehealth: Payer: Self-pay | Admitting: *Deleted

## 2021-10-12 MED ORDER — PREDNISONE 10 MG PO TABS: ORAL_TABLET | ORAL | 0 refills | Status: DC

## 2021-10-12 MED ORDER — AZITHROMYCIN 250 MG PO TABS: 250.0000 mg | ORAL_TABLET | ORAL | 0 refills | Status: DC

## 2021-10-12 NOTE — Telephone Encounter (Signed)
Patient called back and states her COVID test was negative.

## 2021-10-12 NOTE — Telephone Encounter (Signed)
She needs to do her allegra, flonase, could add chlorphentermine OTC as well  Have her take prednisone > Take 40mg  daily for 3 days, then 30mg  daily for 3 days, then 20mg  daily for 3 days, then 10mg  daily for 3 days, then stop Azithromycin > Z-pack

## 2021-10-12 NOTE — Telephone Encounter (Signed)
I called and spoke with the pt and notified of response per Dr Lamonte Sakai  She verbalized understanding  She is taking allegra and flonase  Will add chlortabs  Pred and zpack sent to preferred pharm  She will call if not improving

## 2021-10-12 NOTE — Telephone Encounter (Signed)
Called and spoke with pt who states with the weather changing, she has had problems with increased SOB. States she is using her nebulizer 2-3 times a day.  States that she has been wheezing and also has been coughing getting up phlegm that is clear to yellow in color.  Asked pt if she has run any fever and she said in the mornings she will have a temp but it will go away as the day goes on. Asked if she had taken a covid test to make sure it was negative and she said that she had not. I did ask pt if she had a covid test so she could do to make sure the results were negative and she said she did and said she would do it so we could know the results.  Dr. Lamonte Sakai, please advise what you recommend to help with pt's symptoms.

## 2021-10-12 NOTE — Telephone Encounter (Signed)
Patient called and would like a prescription of predinisone and an antibiotic. Patient states she is having some trouble breathing.   [facility] [place]

## 2021-10-25 ENCOUNTER — Other Ambulatory Visit: Payer: Self-pay | Admitting: Emergency Medicine

## 2021-10-26 ENCOUNTER — Telehealth: Payer: Self-pay | Admitting: Emergency Medicine

## 2021-10-26 ENCOUNTER — Other Ambulatory Visit: Payer: Self-pay | Admitting: Family Medicine

## 2021-10-26 DIAGNOSIS — Z1231 Encounter for screening mammogram for malignant neoplasm of breast: Secondary | ICD-10-CM

## 2021-10-26 NOTE — Telephone Encounter (Signed)
Ileene Musa I called [facility] to refax the [facility] paperwork because I was unsure if we received them yet,  I will keep you posted if I get them. I told [facility] to address it to Dr Lamonte Sakai  Just Juluis Rainier

## 2021-10-31 NOTE — Telephone Encounter (Signed)
Spoke with [employer] who states they currently have the pt's application for pt assistance for [place] all they are needing to make a decision is proof of income and insurance information. Spoke with Caren Griffins at [place] who stated they have that information and will fax it to [employer] on behalf of the pt. Caren Griffins also requested most recent application be faxed over to [facility] for pt record. Fax was sent. Confirmation received. Nothing further needed at this time.

## 2021-11-24 ENCOUNTER — Encounter: Payer: Self-pay | Admitting: Hematology and Oncology

## 2021-11-24 ENCOUNTER — Other Ambulatory Visit: Payer: Self-pay | Admitting: Emergency Medicine

## 2021-11-29 ENCOUNTER — Telehealth: Payer: Self-pay | Admitting: Emergency Medicine

## 2021-11-29 ENCOUNTER — Ambulatory Visit: Payer: HMO | Admitting: Acute Care

## 2021-11-29 NOTE — Telephone Encounter (Signed)
Spoke with pt and as per Maralyn Sago pt needs to be seen sooner then Friday with Dr Delton Coombes. Made pt appt with Beth @ 1PM for SOB, coughing and wheezing.Nothing further needed at this time.

## 2021-11-29 NOTE — Telephone Encounter (Signed)
Called and spoke with pt who states she feels like she needs to have meds sent in prior to her upcoming OV 11/30. Pt has had complaints of increased SOB, coughing, and wheezing. Pt states that when she coughs, the phlegm is mainly clear but states at times it does have a yellow tint to it. Pt has been using 4L O2, is unsure what her O2 sats have been due to not checking them. Pt denies any complaints of fever. Pt has been using her nebulizer at least 2-3 times a day and has been using her rescue inhaler at least 3 times a day.  Sarah, please advise if you are okay prescribing meds for pt to take to hold her over until her upcoming  OV.

## 2021-11-29 NOTE — Telephone Encounter (Signed)
Patient called to ask if the nurse or doctor could call her in a antibiotic and prednisone because she stated she cannot wait until her appt. On 11/30.  Please call patient to discuss at [phone]

## 2021-11-30 ENCOUNTER — Encounter: Payer: Self-pay | Admitting: Primary Care

## 2021-11-30 ENCOUNTER — Telehealth: Payer: Self-pay | Admitting: Primary Care

## 2021-11-30 ENCOUNTER — Ambulatory Visit: Payer: HMO | Admitting: Primary Care

## 2021-11-30 ENCOUNTER — Other Ambulatory Visit (INDEPENDENT_AMBULATORY_CARE_PROVIDER_SITE_OTHER): Payer: HMO

## 2021-11-30 VITALS — BP 112/68 | HR 85 | Temp 98.2°F | Ht 60.0 in | Wt 138.8 lb

## 2021-11-30 DIAGNOSIS — R0602 Shortness of breath: Secondary | ICD-10-CM

## 2021-11-30 DIAGNOSIS — J9611 Chronic respiratory failure with hypoxia: Secondary | ICD-10-CM | POA: Diagnosis not present

## 2021-11-30 DIAGNOSIS — J449 Chronic obstructive pulmonary disease, unspecified: Secondary | ICD-10-CM | POA: Diagnosis not present

## 2021-11-30 DIAGNOSIS — J441 Chronic obstructive pulmonary disease with (acute) exacerbation: Secondary | ICD-10-CM

## 2021-11-30 LAB — GLUCOSE, POCT (MANUAL RESULT ENTRY): POC Glucose: 117 mg/dl — AB (ref 70–99)

## 2021-11-30 MED ORDER — AZITHROMYCIN 250 MG PO TABS: ORAL_TABLET | ORAL | 0 refills | Status: DC

## 2021-11-30 MED ORDER — PREDNISONE 10 MG PO TABS: ORAL_TABLET | ORAL | 0 refills | Status: DC

## 2021-11-30 MED ORDER — METHYLPREDNISOLONE ACETATE 80 MG/ML IJ SUSP: 80.0000 mg | Freq: Once | INTRAMUSCULAR | Status: DC

## 2021-11-30 NOTE — Progress Notes (Signed)
 @Patient  ID: [name] [name], female    DOB: 09/17/1945, 76 y.o.   MRN: [id number]  Chief Complaint  Patient presents with   Acute Visit    Referring provider: Shon Haleimberlake, Kathryn S, *  HPI: 76 year old female, former smoker quit 1996 (60-pack-year history).  Past medical history significant for hypertension, pulmonary embolism, coronary artery disease, COPD, vocal cord dysfunction, chronic respiratory failure acute on chronic respiratory failure, type 2 diabetes, dyslipidemia, iron deficiency, obesity.  Patient of Dr. Delton CoombesByrum, last seen on 10/08/20. Referred to gastroenterology during last visit.    Previous [facility] pulmonary encounter: 11/03/2020 Patient presents today for acute visit for shortness of breath. Patient reports increased shortness of breath over the last week with associated productive cough and wheezing. Cough is new, she is getting up yellow mucus. She is complaint with Jerrye BushyBreztri Aerosphere, which has been working very well for her. She is in the process of applying for patient assistance. She is using 2L POC, O2 90%.   Hx dysphagia. No recent aspiration. She had barium esophagram on 10/25 which demonstrated 5 x 6 cm diverticulum in the distal esophagus.  Otherwise no stricture, mass, or other areas of narrowing.  The 13 mm tablet passed into the stomach without issue.  Mild, nonspecific motility disorder noted as well, but otherwise no significant hernia or reflux. Plan for EGD.    09/08/2021 Follow up : COPD , O2 RF  Patient presents for a 3965-month follow-up.  She has moderate COPD, oxygen dependent respiratory failure.  She is maintained on Breztri twice daily.  Patient says that over the last few weeks she has noticed that her breathing has not been doing as well.  She is got increased cough congestion with thick mucus intermittent wheezing and shortness of breath.  Activity tolerance has not been as well.  She denies any hemoptysis, chest pain, orthopnea, edema. Patient is  very frustrated with her DME company.  She currently gets her oxygen [facility].  Says that her POC oxygen device which she uses all the time and helps her to be more mobile and independent.  Has not been working and she is taken in several times for repairs.  She is very frustrated she cannot carry heavy oxygen tanks.  She is requesting to change her oxygen to a different DME.  She says she has been on oxygen for greater than 10 years.  Patient walk test today in office shows O2 saturations at 82% on room air.  Required 4 L pulsing oxygen on POC device to maintain O2 saturations greater than 88 to 9%.  Patient says she greatly benefits from pulsed oxygen device.  Patient still drives and does her house chores.  She says she cannot live without it..   11/30/2021- Interim hx  Patient presents today for acute overview due to shortness of breath.  Patient has history of COPD and chronic respiratory failure oxygen dependent.   She reports increased shortness of breath with exertion last several days  She has associated nasal congestion and wheezing. Very little mucus production.  She states any time the weather changes she gets sick Using [employer]reztri Aerosphere twice daily as prescribed  She has been using nasal spray as directed  Took mucinex yesterday She wears 4L POC, she got a new machine [facility] but it is making a loud sound   Allergies  Allergen Reactions   Lisinopril Cough    cough   Sulfonamide Derivatives Rash    Immunization History  Administered Date(s)  Administered   Fluad Quad(high Dose 65+) 10/08/2020   Influenza Split 11/29/2010, 10/07/2016, 10/15/2017   Influenza Whole 12/09/2008, 10/09/2009, 10/02/2010, 09/10/2011   Influenza, High Dose Seasonal PF 10/07/2016, 10/15/2017, 08/22/2018   Influenza,inj,Quad PF,6+ Mos 10/01/2012, 10/08/2013, 09/30/2014, 10/11/2015   Influenza,inj,quad, With Preservative 10/08/2014   PFIZER(Purple Top)SARS-COV-2 Vaccination  02/13/2019, 03/06/2019   Pneumococcal Conjugate-13 10/09/2013, 11/20/2013   Pneumococcal Polysaccharide-23 06/21/2001, 09/14/2010   Pneumococcal-Unspecified 10/02/2010   Td 06/21/2001   Tdap 09/14/2010   Zoster Recombinat (Shingrix) 02/27/2018, 07/01/2018   Zoster, Live 05/01/2007    Past Medical History:  Diagnosis Date   Allergic rhinitis, cause unspecified    CAD in native artery 07/25/2017   Chronic airway obstruction, not elsewhere classified    COPD (chronic obstructive pulmonary disease) (HCC)    Depressive disorder, not elsewhere classified    Nephritis and nephropathy, not specified as acute or chronic, with unspecified pathological lesion in kidney    Obesity, unspecified    Other and unspecified hyperlipidemia    Oxygen deficiency    Thyrotoxicosis without mention of goiter or other cause, without mention of thyrotoxic crisis or storm    Unspecified essential hypertension    UTI (urinary tract infection) 2019 dec    Tobacco History: Social History   Tobacco Use  Smoking Status Former   Packs/day: 2.00   Years: 30.00   Total pack years: 60.00   Types: Cigarettes   Start date: 01/10/1960   Quit date: 01/09/1994   Years since quitting: 27.9  Smokeless Tobacco Never   Counseling given: Not Answered   Outpatient Medications Prior to Visit  Medication Sig Dispense Refill   albuterol (PROVENTIL) (2.5 MG/3ML) 0.083% nebulizer solution Take 3 mLs (2.5 mg total) by nebulization every 4 (four) hours as needed for wheezing or shortness of breath (DX: COPD 496). 150 mL 12   albuterol (VENTOLIN HFA) 108 (90 Base) MCG/ACT inhaler Inhale 2 puffs into the lungs every 6 (six) hours as needed for wheezing or shortness of breath. 8 g 6   Ascorbic Acid (VITAMIN C) 1000 MG tablet Take 1,000 mg by mouth daily.     atorvastatin (LIPITOR) 80 MG tablet Take 1 tablet (80 mg total) by mouth daily. 90 tablet 1   Budeson-Glycopyrrol-Formoterol (BREZTRI AEROSPHERE) 160-9-4.8 MCG/ACT AERO  Inhale 2 puffs into the lungs in the morning and at bedtime. 10.7 g 11   Budeson-Glycopyrrol-Formoterol (BREZTRI AEROSPHERE) 160-9-4.8 MCG/ACT AERO Inhale 2 puffs into the lungs in the morning and at bedtime. 10.7 g 0   Calcium Carb-Cholecalciferol (CALCIUM 600+D3 PO) Take 1 tablet by mouth daily.     Cholecalciferol (VITAMIN D-3 PO) Take 1 tablet by mouth daily.     esomeprazole (NEXIUM) 40 MG capsule TAKE 1 CAPSULE BY MOUTH 2 TIMES DAILY BEFORE A MEAL. 180 capsule 2   ferrous sulfate 325 (65 FE) MG tablet Take 325 mg by mouth daily.     fexofenadine (ALLEGRA) 180 MG tablet Take 180 mg by mouth daily.     fluticasone (FLONASE) 50 MCG/ACT nasal spray SPRAY 2 SPRAYS INTO EACH NOSTRIL EVERY DAY 48 mL 1   levothyroxine (SYNTHROID) 75 MCG tablet Take 1 tablet by mouth daily.     losartan (COZAAR) 25 MG tablet Take 25 mg by mouth daily.     metFORMIN (GLUCOPHAGE-XR) 500 MG 24 hr tablet Take 500 mg by mouth every evening.     metoprolol tartrate (LOPRESSOR) 50 MG tablet TAKE 1 TABLET BY MOUTH TWICE A DAY 180 tablet 3   Multiple Vitamins-Minerals (  CENTRUM PO) Take 1 tablet by mouth daily.     ONE TOUCH ULTRA TEST test strip As directed     pantoprazole (PROTONIX) 40 MG tablet TAKE 1 TABLET BY MOUTH TWICE A DAY 180 tablet 1   PROVENTIL HFA 108 (90 Base) MCG/ACT inhaler INHALE 2 PUFFS BY MOUTH INTO LUNGS EVERY SIX HOURS AS NEEDED FOR WHEEZING OR SHORTNESS OF BREATH (Patient taking differently: Inhale 2 puffs into the lungs every 6 (six) hours as needed for wheezing or shortness of breath.) 18 g 3   traZODone (DESYREL) 50 MG tablet Take 50 mg by mouth at bedtime.      venlafaxine XR (EFFEXOR-XR) 150 MG 24 hr capsule Take 150 mg by mouth daily.      predniSONE (DELTASONE) 10 MG tablet 4 x 3 days, 3 x 3 days, 2 x 3 days, 1 x 3 days then stop 30 tablet 0   nitroGLYCERIN (NITROSTAT) 0.4 MG SL tablet Place 1 tablet (0.4 mg total) under the tongue every 5 (five) minutes as needed for chest pain. 25 tablet 4    azithromycin (ZITHROMAX) 250 MG tablet Take 1 tablet (250 mg total) by mouth as directed. 6 tablet 0   No facility-administered medications prior to visit.   Review of Systems  Review of Systems  Constitutional: Negative.   HENT:  Positive for congestion.   Respiratory:  Positive for shortness of breath and wheezing.     Physical Exam  BP 112/68 (BP Location: Left Arm, Patient Position: Sitting, Cuff Size: Normal)   Pulse 85   Temp 98.2 F (36.8 C) (Oral)   Ht 5' (1.524 m)   Wt 138 lb 12.8 oz (63 kg)   SpO2 94%   BMI 27.11 kg/m  Physical Exam Constitutional:      Appearance: Normal appearance.  HENT:     Head: Normocephalic and atraumatic.     Mouth/Throat:     Mouth: Mucous membranes are moist.     Pharynx: Oropharynx is clear.  Cardiovascular:     Rate and Rhythm: Normal rate and regular rhythm.  Pulmonary:     Effort: Pulmonary effort is normal. No respiratory distress.     Breath sounds: Wheezing present. No rhonchi or rales.  Musculoskeletal:        General: Normal range of motion.  Skin:    General: Skin is warm and dry.  Neurological:     General: No focal deficit present.     Mental Status: She is alert and oriented to person, place, and time. Mental status is at baseline.  Psychiatric:        Mood and Affect: Mood normal.        Behavior: Behavior normal.        Thought Content: Thought content normal.        Judgment: Judgment normal.      Lab Results:  CBC    Component Value Date/Time   WBC 8.9 05/04/2020 0811   RBC 4.65 05/04/2020 0811   HGB 14.3 05/04/2020 0811   HGB 12.7 07/02/2019 1458   HGB 12.5 02/21/2017 1428   HCT 44.3 05/04/2020 0811   HCT 38.6 02/21/2017 1428   PLT 380 05/04/2020 0811   PLT 276 07/02/2019 1458   PLT 252 02/21/2017 1428   MCV 95.3 05/04/2020 0811   MCV 91 02/21/2017 1428   MCH 30.8 05/04/2020 0811   MCHC 32.3 05/04/2020 0811   RDW 12.1 05/04/2020 0811   RDW 13.7 02/21/2017 1428   LYMPHSABS 1.4  05/04/2020 0811    LYMPHSABS 1.6 02/21/2017 1428   MONOABS 0.7 05/04/2020 0811   EOSABS 0.1 05/04/2020 0811   EOSABS 0.4 02/21/2017 1428   BASOSABS 0.0 05/04/2020 0811   BASOSABS 0.0 02/21/2017 1428    BMET    Component Value Date/Time   NA 146 (H) 07/28/2021 0955   K 4.1 07/28/2021 0955   CL 98 07/28/2021 0955   CO2 34 (H) 07/28/2021 0955   GLUCOSE 109 (H) 07/28/2021 0955   GLUCOSE 162 (H) 05/05/2020 0456   BUN 15 07/28/2021 0955   CREATININE 0.92 07/28/2021 0955   CALCIUM 9.6 07/28/2021 0955   GFRNONAA >60 05/05/2020 0456   GFRAA 48 (L) 01/01/2018 1145    BNP    Component Value Date/Time   BNP 39.8 05/04/2020 0811    ProBNP    Component Value Date/Time   PROBNP 193 02/24/2020 1545    Imaging: No results found.   Assessment & Plan:   COPD with acute exacerbation (HCC) - Increased dyspnea symptoms last several days with associated upper airway/nasal congestion. Little to no production. She had wheezing on exam. Received DepoMedrol 80mg  IM injection in office. Sending in prednisone taper to start tomorrow and zpack to have on hand if she develops sputum production over the holiday weekend. Continue Breztri Aerosphere 2 puffs every morning and evening; albuterol rescue inhaler or nebulizer every 4-6 hours for breakthrough shortness of breath or wheezing. Advised she take Mucinex twice daily for the next 7 days. Call if symptoms do not improve or worsen.   Chronic respiratory failure with hypoxia (HCC) - Stable; O2 94%  4l pulsed/ Received new POC machine from [facility], appears to be making loud sound and may be overheating. Advised she contact DME company and bring it in to be looked at.    , NP 12/04/2021

## 2021-11-30 NOTE — Patient Instructions (Addendum)
  Recommendations: Continue Breztri 2 puffs every morning and evening Use albuterol rescue inhaler or nebulizer every 4-6 hours for breakthrough shortness of breath or wheezing Continue to take Mucinex twice daily for the next 7 days Continue nasal spray as directed Start prednisone taper tomorrow; if cough does not improve in 2 to 3 days with Mucinex go ahead and take Z-Pak Call [facility] and ask for them to check POC (seems to be overheating and does not sound right)  Orders Please check point-of-care glucose  In office treatment Depo-Medrol 80 mg IM x 1  Follow-up: As needed if your symptoms do not improve

## 2021-11-30 NOTE — Telephone Encounter (Signed)
Checked with Beth about meds who stated that she has sent meds to pharmacy for pt now. Called pt to let her know this had been done. Called pharmacy about the recent message received and they stated that they did receive meds. Nothing further needed.

## 2021-11-30 NOTE — Telephone Encounter (Signed)
[facility] [address]  [facility]ing about [facility] script on this pt they can be reached @ [phone].Caren Griffins

## 2021-12-04 NOTE — Assessment & Plan Note (Addendum)
-   Increased dyspnea symptoms last several days with associated upper airway/nasal congestion. Little to no production. She had wheezing on exam. Received DepoMedrol 80mg  IM injection in office. Sending in prednisone taper to start tomorrow and zpack to have on hand if she develops sputum production over the holiday weekend. Continue Breztri Aerosphere 2 puffs every morning and evening; albuterol rescue inhaler or nebulizer every 4-6 hours for breakthrough shortness of breath or wheezing. Advised she take Mucinex twice daily for the next 7 days. Call if symptoms do not improve or worsen.

## 2021-12-04 NOTE — Assessment & Plan Note (Addendum)
-   Stable; O2 94%  4l pulsed/ Received new POC machine from [facility], appears to be making loud sound and may be overheating. Advised she contact DME company and bring it in to be looked at.

## 2021-12-08 ENCOUNTER — Ambulatory Visit: Payer: HMO | Admitting: Emergency Medicine

## 2021-12-15 ENCOUNTER — Encounter: Payer: Self-pay | Admitting: Hematology and Oncology

## 2021-12-15 ENCOUNTER — Ambulatory Visit
Admission: RE | Admit: 2021-12-15 | Discharge: 2021-12-15 | Disposition: A | Discharge status: Home / Self Care | Payer: PPO | Source: Ambulatory Visit | Attending: Family Medicine | Admitting: Family Medicine

## 2021-12-15 DIAGNOSIS — Z1231 Encounter for screening mammogram for malignant neoplasm of breast: Secondary | ICD-10-CM

## 2022-02-10 ENCOUNTER — Other Ambulatory Visit (HOSPITAL_BASED_OUTPATIENT_CLINIC_OR_DEPARTMENT_OTHER): Payer: Self-pay | Admitting: Cardiovascular Disease

## 2022-02-10 DIAGNOSIS — E785 Hyperlipidemia, unspecified: Secondary | ICD-10-CM | POA: Diagnosis not present

## 2022-02-10 DIAGNOSIS — N183 Chronic kidney disease, stage 3 unspecified: Secondary | ICD-10-CM | POA: Diagnosis not present

## 2022-02-10 DIAGNOSIS — I2510 Atherosclerotic heart disease of native coronary artery without angina pectoris: Secondary | ICD-10-CM | POA: Diagnosis not present

## 2022-02-10 DIAGNOSIS — E039 Hypothyroidism, unspecified: Secondary | ICD-10-CM | POA: Diagnosis not present

## 2022-02-10 DIAGNOSIS — F320 Major depressive disorder, single episode, mild: Secondary | ICD-10-CM | POA: Diagnosis not present

## 2022-02-10 DIAGNOSIS — E1121 Type 2 diabetes mellitus with diabetic nephropathy: Secondary | ICD-10-CM | POA: Diagnosis not present

## 2022-02-10 DIAGNOSIS — R059 Cough, unspecified: Secondary | ICD-10-CM | POA: Diagnosis not present

## 2022-02-10 DIAGNOSIS — I10 Essential (primary) hypertension: Secondary | ICD-10-CM | POA: Diagnosis not present

## 2022-02-10 DIAGNOSIS — G4700 Insomnia, unspecified: Secondary | ICD-10-CM | POA: Diagnosis not present

## 2022-02-10 DIAGNOSIS — R231 Pallor: Secondary | ICD-10-CM | POA: Diagnosis not present

## 2022-02-10 DIAGNOSIS — J449 Chronic obstructive pulmonary disease, unspecified: Secondary | ICD-10-CM | POA: Diagnosis not present

## 2022-02-10 NOTE — Telephone Encounter (Signed)
Rx request sent to pharmacy.  

## 2022-02-20 ENCOUNTER — Other Ambulatory Visit: Payer: Self-pay | Admitting: Emergency Medicine

## 2022-03-21 DIAGNOSIS — E785 Hyperlipidemia, unspecified: Secondary | ICD-10-CM | POA: Diagnosis not present

## 2022-03-21 DIAGNOSIS — J441 Chronic obstructive pulmonary disease with (acute) exacerbation: Secondary | ICD-10-CM | POA: Diagnosis not present

## 2022-03-21 DIAGNOSIS — Z0000 Encounter for general adult medical examination without abnormal findings: Secondary | ICD-10-CM | POA: Diagnosis not present

## 2022-03-21 DIAGNOSIS — I2510 Atherosclerotic heart disease of native coronary artery without angina pectoris: Secondary | ICD-10-CM | POA: Diagnosis not present

## 2022-03-21 DIAGNOSIS — E1121 Type 2 diabetes mellitus with diabetic nephropathy: Secondary | ICD-10-CM | POA: Diagnosis not present

## 2022-03-21 DIAGNOSIS — Z9981 Dependence on supplemental oxygen: Secondary | ICD-10-CM | POA: Diagnosis not present

## 2022-03-21 DIAGNOSIS — N183 Chronic kidney disease, stage 3 unspecified: Secondary | ICD-10-CM | POA: Diagnosis not present

## 2022-03-21 DIAGNOSIS — E039 Hypothyroidism, unspecified: Secondary | ICD-10-CM | POA: Diagnosis not present

## 2022-03-21 DIAGNOSIS — E1122 Type 2 diabetes mellitus with diabetic chronic kidney disease: Secondary | ICD-10-CM | POA: Diagnosis not present

## 2022-03-21 DIAGNOSIS — I10 Essential (primary) hypertension: Secondary | ICD-10-CM | POA: Diagnosis not present

## 2022-03-21 DIAGNOSIS — Z23 Encounter for immunization: Secondary | ICD-10-CM | POA: Diagnosis not present

## 2022-04-14 ENCOUNTER — Telehealth: Payer: Self-pay | Admitting: Emergency Medicine

## 2022-04-14 DIAGNOSIS — J9611 Chronic respiratory failure with hypoxia: Secondary | ICD-10-CM

## 2022-04-14 DIAGNOSIS — J449 Chronic obstructive pulmonary disease, unspecified: Secondary | ICD-10-CM

## 2022-04-14 MED ORDER — PREDNISONE 10 MG PO TABS: ORAL_TABLET | ORAL | 0 refills | Status: DC

## 2022-04-14 MED ORDER — ALBUTEROL SULFATE (2.5 MG/3ML) 0.083% IN NEBU: 2.5000 mg | INHALATION_SOLUTION | RESPIRATORY_TRACT | 12 refills | Status: DC | PRN

## 2022-04-14 NOTE — Telephone Encounter (Signed)
Pt called the office stating that she feels like she is having some problems with her nebulizer machine. States when using it, she does not feel like it is working as well as it used to and wonders if it is time for her to receive a new machine.  Beth, please advise if you are okay with [place] placing an order for pt to receive a new nebulizer machine. With the recent weather, pt has been having to use the machine more often than she used to but does not feel like it has been working as well as before.

## 2022-04-14 NOTE — Telephone Encounter (Signed)
Called and spoke with patient. She verbalized understanding. She also stated that she was in need of a refill on her albuterol solution. I have sent this in as well.   Nothing further needed at time of call.

## 2022-04-14 NOTE — Telephone Encounter (Signed)
Fine to place an order for new nebulizer machine.  I will send in prednisone taper

## 2022-04-21 DIAGNOSIS — J449 Chronic obstructive pulmonary disease, unspecified: Secondary | ICD-10-CM | POA: Diagnosis not present

## 2022-04-24 NOTE — Progress Notes (Unsigned)
  ID: [name] [name], female    DOB: [birth year], 77 y.o.   MRN: [id number]  No chief complaint on file.   Referring provider: Shon Hale, *  HPI:  77 year old female, former smoker quit 1996 (60-pack-year history).  Past medical history significant for hypertension, pulmonary embolism, coronary artery disease, COPD, vocal cord dysfunction, chronic respiratory failure acute on chronic respiratory failure, type 2 diabetes, dyslipidemia, iron deficiency, obesity.  Patient of Dr. Delton Coombes, last seen on 10/08/20. Referred to gastroenterology during last visit.    Previous [facility] pulmonary encounter: 11/03/2020 Patient presents today for acute visit for shortness of breath. Patient reports increased shortness of breath over the last week with associated productive cough and wheezing. Cough is new, she is getting up yellow mucus. She is complaint with Jerrye Bushy, which has been working very well for her. She is in the process of applying for patient assistance. She is using 2L POC, O2 90%.   Hx dysphagia. No recent aspiration. She had barium esophagram on 10/25 which demonstrated 5 x 6 cm diverticulum in the distal esophagus.  Otherwise no stricture, mass, or other areas of narrowing.  The 13 mm tablet passed into the stomach without issue.  Mild, nonspecific motility disorder noted as well, but otherwise no significant hernia or reflux. Plan for EGD.    09/08/2021 Follow up : COPD , O2 RF  Patient presents for a 25-month follow-up.  She has moderate COPD, oxygen dependent respiratory failure.  She is maintained on Breztri twice daily.  Patient says that over the last few weeks she has noticed that her breathing has not been doing as well.  She is got increased cough congestion with thick mucus intermittent wheezing and shortness of breath.  Activity tolerance has not been as well.  She denies any hemoptysis, chest pain, orthopnea, edema. Patient is very frustrated with her DME  company.  She currently gets her oxygen from [facility].  Says that her POC oxygen device which she uses all the time and helps her to be more mobile and independent.  Has not been working and she is taken in several times for repairs.  She is very frustrated she cannot carry heavy oxygen tanks.  She is requesting to change her oxygen to a different DME.  She says she has been on oxygen for greater than 10 years.  Patient walk test today in office shows O2 saturations at 82% on room air.  Required 4 L pulsing oxygen on POC device to maintain O2 saturations greater than 88 to 9%.  Patient says she greatly benefits from pulsed oxygen device.  Patient still drives and does her house chores.  She says she cannot live without it.  11/30/2021 Patient presents today for acute overview due to shortness of breath.  Patient has history of COPD and chronic respiratory failure oxygen dependent.   She reports increased shortness of breath with exertion last several days  She has associated nasal congestion and wheezing. Very little mucus production.  She states any time the weather changes she gets sick Using [employer] twice daily as prescribed  She has been using nasal spray as directed  Took mucinex yesterday She wears 4L POC, she got a new machine from [facility] but it is making a loud sound   04/25/2022- interim hx  Patient presents today for OV follow-up.  Patient is followed by our office for COPD and chronic respiratory failure.         Allergies  Allergen  Reactions   Lisinopril Cough    cough   Sulfonamide Derivatives Rash    Immunization History  Administered Date(s) Administered   Fluad Quad(high Dose 65+) 10/08/2020   Influenza Split 11/29/2010, 10/07/2016, 10/15/2017   Influenza Whole 12/09/2008, 10/09/2009, 10/02/2010, 09/10/2011   Influenza, High Dose Seasonal PF 10/07/2016, 10/15/2017, 08/22/2018   Influenza,inj,Quad PF,6+ Mos 10/01/2012, 10/08/2013, 09/30/2014,  10/11/2015   Influenza,inj,quad, With Preservative 10/08/2014   PFIZER(Purple Top)SARS-COV-2 Vaccination 02/13/2019, 03/06/2019   Pneumococcal Conjugate-13 10/09/2013, 11/20/2013   Pneumococcal Polysaccharide-23 06/21/2001, 09/14/2010   Pneumococcal-Unspecified 10/02/2010   Td 06/21/2001   Tdap 09/14/2010   Zoster Recombinat (Shingrix) 02/27/2018, 07/01/2018   Zoster, Live 05/01/2007    Past Medical History:  Diagnosis Date   Allergic rhinitis, cause unspecified    CAD in native artery 07/25/2017   Chronic airway obstruction, not elsewhere classified    COPD (chronic obstructive pulmonary disease) (HCC)    Depressive disorder, not elsewhere classified    Nephritis and nephropathy, not specified as acute or chronic, with unspecified pathological lesion in kidney    Obesity, unspecified    Other and unspecified hyperlipidemia    Oxygen deficiency    Thyrotoxicosis without mention of goiter or other cause, without mention of thyrotoxic crisis or storm    Unspecified essential hypertension    UTI (urinary tract infection) 2019 dec    Tobacco History: Social History   Tobacco Use  Smoking Status Former   Packs/day: 2.00   Years: 30.00   Additional pack years: 0.00   Total pack years: 60.00   Types: Cigarettes   Start date: 01/10/1960   Quit date: 01/09/1994   Years since quitting: 28.3  Smokeless Tobacco Never   Counseling given: Not Answered   Outpatient Medications Prior to Visit  Medication Sig Dispense Refill   albuterol (PROVENTIL) (2.5 MG/3ML) 0.083% nebulizer solution Take 3 mLs (2.5 mg total) by nebulization every 4 (four) hours as needed for wheezing or shortness of breath. 540 mL 12   albuterol (VENTOLIN HFA) 108 (90 Base) MCG/ACT inhaler Inhale 2 puffs into the lungs every 6 (six) hours as needed for wheezing or shortness of breath. 8 g 6   Ascorbic Acid (VITAMIN C) 1000 MG tablet Take 1,000 mg by mouth daily.     atorvastatin (LIPITOR) 80 MG tablet TAKE 1 TABLET  BY MOUTH EVERY DAY 90 tablet 1   azithromycin (ZITHROMAX) 250 MG tablet Take 2 tablets day #1; then 1 tablet daily x 4 days 6 tablet 0   Budeson-Glycopyrrol-Formoterol (BREZTRI AEROSPHERE) 160-9-4.8 MCG/ACT AERO Inhale 2 puffs into the lungs in the morning and at bedtime. 10.7 g 11   Budeson-Glycopyrrol-Formoterol (BREZTRI AEROSPHERE) 160-9-4.8 MCG/ACT AERO Inhale 2 puffs into the lungs in the morning and at bedtime. 10.7 g 0   Calcium Carb-Cholecalciferol (CALCIUM 600+D3 PO) Take 1 tablet by mouth daily.     Cholecalciferol (VITAMIN D-3 PO) Take 1 tablet by mouth daily.     esomeprazole (NEXIUM) 40 MG capsule TAKE 1 CAPSULE BY MOUTH 2 TIMES DAILY BEFORE A MEAL. 180 capsule 2   ferrous sulfate 325 (65 FE) MG tablet Take 325 mg by mouth daily.     fexofenadine (ALLEGRA) 180 MG tablet Take 180 mg by mouth daily.     fluticasone (FLONASE) 50 MCG/ACT nasal spray SPRAY 2 SPRAYS INTO EACH NOSTRIL EVERY DAY 48 mL 1   levothyroxine (SYNTHROID) 75 MCG tablet Take 1 tablet by mouth daily.     losartan (COZAAR) 25 MG tablet Take 25 mg by  mouth daily.     metFORMIN (GLUCOPHAGE-XR) 500 MG 24 hr tablet Take 500 mg by mouth every evening.     metoprolol tartrate (LOPRESSOR) 50 MG tablet TAKE 1 TABLET BY MOUTH TWICE A DAY 180 tablet 3   Multiple Vitamins-Minerals (CENTRUM PO) Take 1 tablet by mouth daily.     nitroGLYCERIN (NITROSTAT) 0.4 MG SL tablet Place 1 tablet (0.4 mg total) under the tongue every 5 (five) minutes as needed for chest pain. 25 tablet 4   ONE TOUCH ULTRA TEST test strip As directed     pantoprazole (PROTONIX) 40 MG tablet TAKE 1 TABLET BY MOUTH TWICE A DAY 180 tablet 1   predniSONE (DELTASONE) 10 MG tablet 4 tabs for 2 days, then 3 tabs for 2 days, 2 tabs for 2 days, then 1 tab for 2 days, then stop 20 tablet 0   PROVENTIL HFA 108 (90 Base) MCG/ACT inhaler INHALE 2 PUFFS BY MOUTH INTO LUNGS EVERY SIX HOURS AS NEEDED FOR WHEEZING OR SHORTNESS OF BREATH (Patient taking differently: Inhale 2  puffs into the lungs every 6 (six) hours as needed for wheezing or shortness of breath.) 18 g 3   traZODone (DESYREL) 50 MG tablet Take 50 mg by mouth at bedtime.      venlafaxine XR (EFFEXOR-XR) 150 MG 24 hr capsule Take 150 mg by mouth daily.      No facility-administered medications prior to visit.      Review of Systems  Review of Systems   Physical Exam  There were no vitals taken for this visit. Physical Exam   Lab Results:  CBC    Component Value Date/Time   WBC 8.9 05/04/2020 0811   RBC 4.65 05/04/2020 0811   HGB 14.3 05/04/2020 0811   HGB 12.7 07/02/2019 1458   HGB 12.5 02/21/2017 1428   HCT 44.3 05/04/2020 0811   HCT 38.6 02/21/2017 1428   PLT 380 05/04/2020 0811   PLT 276 07/02/2019 1458   PLT 252 02/21/2017 1428   MCV 95.3 05/04/2020 0811   MCV 91 02/21/2017 1428   MCH 30.8 05/04/2020 0811   MCHC 32.3 05/04/2020 0811   RDW 12.1 05/04/2020 0811   RDW 13.7 02/21/2017 1428   LYMPHSABS 1.4 05/04/2020 0811   LYMPHSABS 1.6 02/21/2017 1428   MONOABS 0.7 05/04/2020 0811   EOSABS 0.1 05/04/2020 0811   EOSABS 0.4 02/21/2017 1428   BASOSABS 0.0 05/04/2020 0811   BASOSABS 0.0 02/21/2017 1428    BMET    Component Value Date/Time   NA 146 (H) 07/28/2021 0955   K 4.1 07/28/2021 0955   CL 98 07/28/2021 0955   CO2 34 (H) 07/28/2021 0955   GLUCOSE 109 (H) 07/28/2021 0955   GLUCOSE 162 (H) 05/05/2020 0456   BUN 15 07/28/2021 0955   CREATININE 0.92 07/28/2021 0955   CALCIUM 9.6 07/28/2021 0955   GFRNONAA >60 05/05/2020 0456   GFRAA 48 (L) 01/01/2018 1145    BNP    Component Value Date/Time   BNP 39.8 05/04/2020 0811    ProBNP    Component Value Date/Time   PROBNP 193 02/24/2020 1545    Imaging: No results found.   Assessment & Plan:   No problem-specific Assessment & Plan notes found for this encounter.     Glenford Bayley, NP 04/24/2022

## 2022-04-25 ENCOUNTER — Ambulatory Visit (INDEPENDENT_AMBULATORY_CARE_PROVIDER_SITE_OTHER): Payer: PPO | Admitting: Primary Care

## 2022-04-25 ENCOUNTER — Encounter: Payer: Self-pay | Admitting: Primary Care

## 2022-04-25 VITALS — BP 118/60 | HR 107 | Ht 60.0 in | Wt 143.0 lb

## 2022-04-25 DIAGNOSIS — R600 Localized edema: Secondary | ICD-10-CM

## 2022-04-25 DIAGNOSIS — J9611 Chronic respiratory failure with hypoxia: Secondary | ICD-10-CM | POA: Diagnosis not present

## 2022-04-25 DIAGNOSIS — J441 Chronic obstructive pulmonary disease with (acute) exacerbation: Secondary | ICD-10-CM

## 2022-04-25 LAB — CBC WITH DIFFERENTIAL/PLATELET
Basophils Absolute: 0.1 10*3/uL (ref 0.0–0.1)
Basophils Relative: 0.9 % (ref 0.0–3.0)
Eosinophils Absolute: 0.3 10*3/uL (ref 0.0–0.7)
Eosinophils Relative: 3.3 % (ref 0.0–5.0)
HCT: 31.8 % — ABNORMAL LOW (ref 36.0–46.0)
Hemoglobin: 10.7 g/dL — ABNORMAL LOW (ref 12.0–15.0)
Lymphocytes Relative: 13.6 % (ref 12.0–46.0)
Lymphs Abs: 1.4 10*3/uL (ref 0.7–4.0)
MCHC: 33.7 g/dL (ref 30.0–36.0)
MCV: 91.3 fl (ref 78.0–100.0)
Monocytes Absolute: 0.8 10*3/uL (ref 0.1–1.0)
Monocytes Relative: 8 % (ref 3.0–12.0)
Neutro Abs: 7.4 10*3/uL (ref 1.4–7.7)
Neutrophils Relative %: 74.2 % (ref 43.0–77.0)
Platelets: 355 10*3/uL (ref 150.0–400.0)
RBC: 3.48 Mil/uL — ABNORMAL LOW (ref 3.87–5.11)
RDW: 14 % (ref 11.5–15.5)
WBC: 10 10*3/uL (ref 4.0–10.5)

## 2022-04-25 LAB — BASIC METABOLIC PANEL
BUN: 17 mg/dL (ref 6–23)
CO2: 36 mEq/L — ABNORMAL HIGH (ref 19–32)
Calcium: 9.2 mg/dL (ref 8.4–10.5)
Chloride: 95 mEq/L — ABNORMAL LOW (ref 96–112)
Creatinine, Ser: 1.14 mg/dL (ref 0.40–1.20)
GFR: 46.46 mL/min — ABNORMAL LOW (ref >60.00)
Glucose, Bld: 286 mg/dL — ABNORMAL HIGH (ref 70–99)
Potassium: 3.8 mEq/L (ref 3.5–5.1)
Sodium: 140 mEq/L (ref 135–145)

## 2022-04-25 LAB — BRAIN NATRIURETIC PEPTIDE: Pro B Natriuretic peptide (BNP): 47 pg/mL (ref 0.0–100.0)

## 2022-04-25 MED ORDER — AMOXICILLIN-POT CLAVULANATE 875-125 MG PO TABS: 1.0000 | ORAL_TABLET | Freq: Two times a day (BID) | ORAL | 0 refills | Status: DC

## 2022-04-25 NOTE — Patient Instructions (Signed)
Recommendations - Continue to use Breztri 2 puffs morning and evening - Use albuterol nebulizer every 4-6 hours as needed for breakthrough shortness of breath or wheezing - Use 3 L continuous oxygen 24/7 to maintain O2 >88-90% (use 4L on pulsed concentrator) - Start prednisone taper today - Take antibiotic as prescribed - If symptoms do not improve notify office  Orders: - Labs   Follow-up - 3 months with Dr. Delton Coombes

## 2022-04-25 NOTE — Assessment & Plan Note (Addendum)
-   Increased dyspnea along with pedal edema - Checking BNP and BMET - Echocardiogram in 2019 showed grade 1 diastolic dysfunction - Consider prn lasix/follow-up with cardiology

## 2022-04-25 NOTE — Assessment & Plan Note (Addendum)
-   Patient develop productive cough 1-2 weeks ago. Breathing is somewhat exacerbated. She has inspiratory wheeze/rhonchi on exam. Advised she start prednisone taper that was sent on 04/14/22. Rx Augmentin 1 tab twice daily x 7 days. Continue Breztri Aerosphere twice daily and Albuterol nebulizer q4-6 hours for breakthrough symptoms. Checking basic labs, patient wanted to hold off on chest imaging today. FU if symptoms do not improve.

## 2022-04-25 NOTE — Assessment & Plan Note (Signed)
-   Stable; No increased O2 demand - Continue 3L oxygen to maintain O2 >88-90%; 4L with POC

## 2022-04-25 NOTE — Progress Notes (Signed)
Please let patient know her BNP was normal meaning that leg swelling is likely not due to underlying heart failure. Continue with compression stockings and elevate her legs.  She was slightly anemic, monitor for bleeding in stool or urine. Her CO2 level was elevated on her lab work, her oxygen level was 100% on 4 L.  I would advise she decrease her oxygen to 2 to 3 L to maintain level between 90 and 92%. Take abx and prednisone as directed. Needs repeat CBC and BMET in 1 week please

## 2022-05-04 ENCOUNTER — Other Ambulatory Visit: Payer: Self-pay | Admitting: *Deleted

## 2022-05-04 DIAGNOSIS — J9611 Chronic respiratory failure with hypoxia: Secondary | ICD-10-CM

## 2022-05-04 DIAGNOSIS — R600 Localized edema: Secondary | ICD-10-CM

## 2022-05-04 DIAGNOSIS — J441 Chronic obstructive pulmonary disease with (acute) exacerbation: Secondary | ICD-10-CM

## 2022-05-08 ENCOUNTER — Encounter: Payer: Self-pay | Admitting: Hematology and Oncology

## 2022-05-08 ENCOUNTER — Other Ambulatory Visit (INDEPENDENT_AMBULATORY_CARE_PROVIDER_SITE_OTHER): Payer: PPO

## 2022-05-08 DIAGNOSIS — J441 Chronic obstructive pulmonary disease with (acute) exacerbation: Secondary | ICD-10-CM | POA: Diagnosis not present

## 2022-05-08 DIAGNOSIS — R600 Localized edema: Secondary | ICD-10-CM | POA: Diagnosis not present

## 2022-05-08 DIAGNOSIS — J9611 Chronic respiratory failure with hypoxia: Secondary | ICD-10-CM | POA: Diagnosis not present

## 2022-05-08 LAB — CBC WITH DIFFERENTIAL/PLATELET
Basophils Absolute: 0 10*3/uL (ref 0.0–0.1)
Basophils Relative: 0.3 % (ref 0.0–3.0)
Eosinophils Absolute: 0.2 10*3/uL (ref 0.0–0.7)
Eosinophils Relative: 2.3 % (ref 0.0–5.0)
HCT: 30.2 % — ABNORMAL LOW (ref 36.0–46.0)
Hemoglobin: 10.1 g/dL — ABNORMAL LOW (ref 12.0–15.0)
Lymphocytes Relative: 18.4 % (ref 12.0–46.0)
Lymphs Abs: 1.6 10*3/uL (ref 0.7–4.0)
MCHC: 33.3 g/dL (ref 30.0–36.0)
MCV: 90.4 fl (ref 78.0–100.0)
Monocytes Absolute: 0.6 10*3/uL (ref 0.1–1.0)
Monocytes Relative: 7 % (ref 3.0–12.0)
Neutro Abs: 6.4 10*3/uL (ref 1.4–7.7)
Neutrophils Relative %: 72 % (ref 43.0–77.0)
Platelets: 224 10*3/uL (ref 150.0–400.0)
RBC: 3.34 Mil/uL — ABNORMAL LOW (ref 3.87–5.11)
RDW: 13.5 % (ref 11.5–15.5)
WBC: 8.8 10*3/uL (ref 4.0–10.5)

## 2022-05-08 LAB — BASIC METABOLIC PANEL
BUN: 15 mg/dL (ref 6–23)
CO2: 40 mEq/L — ABNORMAL HIGH (ref 19–32)
Calcium: 8.9 mg/dL (ref 8.4–10.5)
Chloride: 96 mEq/L (ref 96–112)
Creatinine, Ser: 1.04 mg/dL (ref 0.40–1.20)
GFR: 51.86 mL/min — ABNORMAL LOW (ref >60.00)
Glucose, Bld: 121 mg/dL — ABNORMAL HIGH (ref 70–99)
Potassium: 3.9 mEq/L (ref 3.5–5.1)
Sodium: 143 mEq/L (ref 135–145)

## 2022-05-08 NOTE — Progress Notes (Signed)
Please let patient know kidney function and glucose are better. CO2 is still elevated, can you ask her what her oxygen level has been running at home. In the office on 4L she was 100%. Recommended she decrease her O2 to 2-3L to keep O2 level between 88-92%. May consider either sleep study or Trilogy ventilator at night.

## 2022-05-10 ENCOUNTER — Other Ambulatory Visit: Payer: Self-pay | Admitting: Cardiovascular Disease

## 2022-05-10 NOTE — Telephone Encounter (Signed)
Rx request sent to pharmacy.  

## 2022-05-17 ENCOUNTER — Ambulatory Visit: Payer: PPO | Admitting: Pulmonary Disease

## 2022-05-17 ENCOUNTER — Ambulatory Visit: Payer: PPO | Admitting: Student

## 2022-05-17 ENCOUNTER — Ambulatory Visit (INDEPENDENT_AMBULATORY_CARE_PROVIDER_SITE_OTHER): Payer: PPO

## 2022-05-17 ENCOUNTER — Encounter: Payer: Self-pay | Admitting: Student

## 2022-05-17 VITALS — BP 132/86 | HR 109 | Temp 98.2°F | Ht 60.0 in | Wt 141.4 lb

## 2022-05-17 DIAGNOSIS — R0600 Dyspnea, unspecified: Secondary | ICD-10-CM | POA: Diagnosis not present

## 2022-05-17 DIAGNOSIS — J441 Chronic obstructive pulmonary disease with (acute) exacerbation: Secondary | ICD-10-CM | POA: Diagnosis not present

## 2022-05-17 MED ORDER — PREDNISONE 10 MG PO TABS: ORAL_TABLET | ORAL | 0 refills | Status: DC

## 2022-05-17 MED ORDER — LEVOFLOXACIN 750 MG PO TABS: 750.0000 mg | ORAL_TABLET | Freq: Every day | ORAL | 0 refills | Status: DC

## 2022-05-17 NOTE — Progress Notes (Signed)
Synopsis: Referred for dyspnea by Shon Hale, *  Subjective:   PATIENT ID: [name] [name] GENDER: female DOB: 12-15-1945, MRN: [id number]  Chief Complaint  Patient presents with   Acute Visit    Productive cough with green phlegm, nasal congestion, SOB. Symptoms present 2 weeks    76yF smoker quit 1996, COPD, VCD, chronic hypoxic respiratory failure  Last ov with BW given course of augmentin, prednisone 4/16, continued on breztri, 3L O2 continuous, 4L pulsed. LE edema noted as well. No better after this.   Still very productive cough, far different from baselin cough.   Otherwise pertinent review of systems is negative.  Past Medical History:  Diagnosis Date   Allergic rhinitis, cause unspecified    CAD in native artery 07/25/2017   Chronic airway obstruction, not elsewhere classified    COPD (chronic obstructive pulmonary disease) (HCC)    Depressive disorder, not elsewhere classified    Nephritis and nephropathy, not specified as acute or chronic, with unspecified pathological lesion in kidney    Obesity, unspecified    Other and unspecified hyperlipidemia    Oxygen deficiency    Thyrotoxicosis without mention of goiter or other cause, without mention of thyrotoxic crisis or storm    Unspecified essential hypertension    UTI (urinary tract infection) 2019 dec     Family History  Problem Relation Age of Onset   Heart failure Mother    Hyperlipidemia Mother    Hypertension Mother    Diabetes Mother    CAD Mother    Cancer Brother    Stroke Maternal Grandfather    Diabetes Brother      Past Surgical History:  Procedure Laterality Date   BIOPSY  01/19/2021   Procedure: BIOPSY;  Surgeon: Shellia Cleverly, DO;  Location: [facility];  Service: Gastroenterology;;   CHOLECYSTECTOMY     ESOPHAGOGASTRODUODENOSCOPY (EGD) WITH PROPOFOL N/A 01/19/2021   Procedure: ESOPHAGOGASTRODUODENOSCOPY (EGD) WITH PROPOFOL;  Surgeon: Shellia Cleverly, DO;  Location:  [facility];  Service: Gastroenterology;  Laterality: N/A;   LEFT HEART CATH AND CORONARY ANGIOGRAPHY N/A 03/01/2017   Procedure: LEFT HEART CATH AND CORONARY ANGIOGRAPHY;  Surgeon: Yvonne Kendall, MD;  Location: [facility];  Service: Cardiovascular;  Laterality: N/A;    Social History   Socioeconomic History   Marital status: Widowed    Spouse name: Not on file   Number of children: Not on file   Years of education: Not on file   Highest education level: Not on file  Occupational History   Not on file  Tobacco Use   Smoking status: Former    Packs/day: 2.00    Years: 30.00    Additional pack years: 0.00    Total pack years: 60.00    Types: Cigarettes    Start date: 01/10/1960    Quit date: 01/09/1994    Years since quitting: 28.3   Smokeless tobacco: Never  Vaping Use   Vaping Use: Never used  Substance and Sexual Activity   Alcohol use: No   Drug use: Never   Sexual activity: Not on file  Other Topics Concern   Not on file  Social History Narrative   Not on file   Social Determinants of Health   Financial Resource Strain: Not on file  Food Insecurity: No Food Insecurity (07/25/2019)   Hunger Vital Sign    Worried About Running Out of Food in the Last Year: Never true    Ran Out of Food in the  Last Year: Never true  Transportation Needs: No Transportation Needs (07/25/2019)   PRAPARE - Administrator, Civil Service (Medical): No    Lack of Transportation (Non-Medical): No  Physical Activity: Not on file  Stress: Not on file  Social Connections: Not on file  Intimate Partner Violence: Not on file     Allergies  Allergen Reactions   Lisinopril Cough    cough   Sulfonamide Derivatives Rash     Outpatient Medications Prior to Visit  Medication Sig Dispense Refill   albuterol (PROVENTIL) (2.5 MG/3ML) 0.083% nebulizer solution Take 3 mLs (2.5 mg total) by nebulization every 4 (four) hours as needed for wheezing or shortness of breath. 540 mL 12    albuterol (VENTOLIN HFA) 108 (90 Base) MCG/ACT inhaler Inhale 2 puffs into the lungs every 6 (six) hours as needed for wheezing or shortness of breath. 8 g 6   Ascorbic Acid (VITAMIN C) 1000 MG tablet Take 1,000 mg by mouth daily.     atorvastatin (LIPITOR) 80 MG tablet TAKE 1 TABLET BY MOUTH EVERY DAY 90 tablet 1   Budeson-Glycopyrrol-Formoterol (BREZTRI AEROSPHERE) 160-9-4.8 MCG/ACT AERO Inhale 2 puffs into the lungs in the morning and at bedtime. 10.7 g 11   Calcium Carb-Cholecalciferol (CALCIUM 600+D3 PO) Take 1 tablet by mouth daily.     Cholecalciferol (VITAMIN D-3 PO) Take 1 tablet by mouth daily.     esomeprazole (NEXIUM) 40 MG capsule TAKE 1 CAPSULE BY MOUTH 2 TIMES DAILY BEFORE A MEAL. 180 capsule 2   ferrous sulfate 325 (65 FE) MG tablet Take 325 mg by mouth daily.     fexofenadine (ALLEGRA) 180 MG tablet Take 180 mg by mouth daily.     fluticasone (FLONASE) 50 MCG/ACT nasal spray SPRAY 2 SPRAYS INTO EACH NOSTRIL EVERY DAY 48 mL 1   levothyroxine (SYNTHROID) 75 MCG tablet Take 1 tablet by mouth daily.     losartan (COZAAR) 25 MG tablet Take 25 mg by mouth daily.     metFORMIN (GLUCOPHAGE-XR) 500 MG 24 hr tablet Take 500 mg by mouth every evening.     metoprolol tartrate (LOPRESSOR) 50 MG tablet TAKE 1 TABLET BY MOUTH TWICE A DAY 180 tablet 0   Multiple Vitamins-Minerals (CENTRUM PO) Take 1 tablet by mouth daily.     ONE TOUCH ULTRA TEST test strip As directed     pantoprazole (PROTONIX) 40 MG tablet TAKE 1 TABLET BY MOUTH TWICE A DAY 180 tablet 1   PROVENTIL HFA 108 (90 Base) MCG/ACT inhaler INHALE 2 PUFFS BY MOUTH INTO LUNGS EVERY SIX HOURS AS NEEDED FOR WHEEZING OR SHORTNESS OF BREATH (Patient taking differently: Inhale 2 puffs into the lungs every 6 (six) hours as needed for wheezing or shortness of breath.) 18 g 3   traZODone (DESYREL) 50 MG tablet Take 50 mg by mouth at bedtime.      venlafaxine XR (EFFEXOR-XR) 150 MG 24 hr capsule Take 150 mg by mouth daily.       amoxicillin-clavulanate (AUGMENTIN) 875-125 MG tablet Take 1 tablet by mouth 2 (two) times daily. 14 tablet 0   azithromycin (ZITHROMAX) 250 MG tablet Take 2 tablets day #1; then 1 tablet daily x 4 days 6 tablet 0   Budeson-Glycopyrrol-Formoterol (BREZTRI AEROSPHERE) 160-9-4.8 MCG/ACT AERO Inhale 2 puffs into the lungs in the morning and at bedtime. 10.7 g 0   nitroGLYCERIN (NITROSTAT) 0.4 MG SL tablet Place 1 tablet (0.4 mg total) under the tongue every 5 (five) minutes as needed for  chest pain. 25 tablet 4   predniSONE (DELTASONE) 10 MG tablet 4 tabs for 2 days, then 3 tabs for 2 days, 2 tabs for 2 days, then 1 tab for 2 days, then stop 20 tablet 0   No facility-administered medications prior to visit.       Objective:   Physical Exam:  General appearance: 77 y.o., female, mildly distressed, conversant, frail Eyes: anicteric sclerae; PERRL, tracking appropriately HENT: NCAT; MMM Neck: Trachea midline; no lymphadenopathy, no JVD Lungs: wheeze bl, with normal respiratory effort CV: tachy RR, no murmur  Abdomen: Soft, non-tender; non-distended, BS present  Extremities: 1+ edema over feet, warm Skin: Normal turgor and texture; no rash Psych: Appropriate affect Neuro: Alert and oriented to person and place, no focal deficit     Vitals:   05/17/22 1403  BP: 132/86  Pulse: (!) 109  Temp: 98.2 F (36.8 C)  TempSrc: Oral  SpO2: 94%  Weight: 141 lb 6.4 oz (64.1 kg)  Height: 5' (1.524 m)   94% on 4 LPM  BMI Readings from Last 3 Encounters:  05/17/22 27.62 kg/m  04/25/22 27.93 kg/m  11/30/21 27.11 kg/m   Wt Readings from Last 3 Encounters:  05/17/22 141 lb 6.4 oz (64.1 kg)  04/25/22 143 lb (64.9 kg)  11/30/21 138 lb 12.8 oz (63 kg)     CBC    Component Value Date/Time   WBC 8.8 05/08/2022 0927   RBC 3.34 (L) 05/08/2022 0927   HGB 10.1 (L) 05/08/2022 0927   HGB 12.7 07/02/2019 1458   HGB 12.5 02/21/2017 1428   HCT 30.2 (L) 05/08/2022 0927   HCT 38.6 02/21/2017  1428   PLT 224.0 05/08/2022 0927   PLT 276 07/02/2019 1458   PLT 252 02/21/2017 1428   MCV 90.4 05/08/2022 0927   MCV 91 02/21/2017 1428   MCH 30.8 05/04/2020 0811   MCHC 33.3 05/08/2022 0927   RDW 13.5 05/08/2022 0927   RDW 13.7 02/21/2017 1428   LYMPHSABS 1.6 05/08/2022 0927   LYMPHSABS 1.6 02/21/2017 1428   MONOABS 0.6 05/08/2022 0927   EOSABS 0.2 05/08/2022 0927   EOSABS 0.4 02/21/2017 1428   BASOSABS 0.0 05/08/2022 0927   BASOSABS 0.0 02/21/2017 1428     Chest Imaging: CXR 05/17/22 similar to prior by my read  Pulmonary Functions Testing Results:     No data to display             Assessment & Plan:   # AECOPD # Dyspnea Some improvement temporarily after burst of prednisone/augmentin following last ov 4/16 but productive cough over baseline came back with a fury.  # Chronic hypoxemic respiratory failure  Plan: - levaquin and prednisone. Would take a probiotic while on levaquin - breztri 2 puff twice daily, rinse mouth after use - flutter valve 10 slow but firm puffs twice daily after each use of breztri - mucinex as needed to thin mucus - compression stockings, elevation for foot swelling  - see you in 8 weeks or sooner if need be!     Omar Person, MD [facility] 05/17/2022 2:08 PM

## 2022-05-17 NOTE — Patient Instructions (Addendum)
-   x ray today - levaquin and prednisone. Would take a probiotic while on levaquin - breztri 2 puff twice daily, rinse mouth after use - flutter valve 10 slow but firm puffs twice daily after each use of breztri - mucinex as needed to thin mucus - compression stockings, elevation for foot swelling - see you in 8 weeks or sooner if need be!

## 2022-05-21 DIAGNOSIS — J449 Chronic obstructive pulmonary disease, unspecified: Secondary | ICD-10-CM | POA: Diagnosis not present

## 2022-06-09 ENCOUNTER — Telehealth: Payer: Self-pay | Admitting: Emergency Medicine

## 2022-06-09 MED ORDER — PREDNISONE 20 MG PO TABS: 20.0000 mg | ORAL_TABLET | Freq: Every day | ORAL | 0 refills | Status: DC

## 2022-06-09 MED ORDER — FUROSEMIDE 20 MG PO TABS: 20.0000 mg | ORAL_TABLET | Freq: Every day | ORAL | 0 refills | Status: DC

## 2022-06-09 NOTE — Telephone Encounter (Signed)
Called and spoke to patient.  She reports of increased SOB, wheezing, dry cough, feet/ankle edema x9mo She started mucinex today. She uses albuterol 2-3x daily and Breztri BID She does not take a diuretic. She has a cardiologist but has not reached out.  Denied calf pain, redness or edema.  She wears 3L cont. Spo2 100% on 3L during conversation. Instructed her to decrease O2 to 2L.   Dr. Delton Coombes, please advise. Thanks

## 2022-06-09 NOTE — Telephone Encounter (Signed)
PT is calling because her feet and ankle are swollen. She wanted to have something to get her trough the weekend. Last time the Levaquin and Pred helped.  Sending back high priority due to time of day and need for weekend relief.  Pharm is [facility] on [facility] [address] [facility] [facility]

## 2022-06-09 NOTE — Telephone Encounter (Signed)
I am OK with giving her brief course prednisone, but I want her to take lasix for 3 days as below, and for her to call her cardiology office to let them know what is going on. If she is no better next week then she needs to come in to be seen as an acute visit  Prednisone 20mg  qd x 5 days Lasix 20mg  qd x 3 days

## 2022-06-09 NOTE — Telephone Encounter (Signed)
Patient is aware of below message/recommendations and voiced her understanding.  She will call back with update next week. Prednisone and Lasix sent to preferred pharmacy. Nothing further needed.

## 2022-06-14 ENCOUNTER — Other Ambulatory Visit: Payer: Self-pay | Admitting: Emergency Medicine

## 2022-06-21 DIAGNOSIS — J449 Chronic obstructive pulmonary disease, unspecified: Secondary | ICD-10-CM | POA: Diagnosis not present

## 2022-07-12 ENCOUNTER — Other Ambulatory Visit: Payer: Self-pay | Admitting: Adult Health

## 2022-07-12 ENCOUNTER — Other Ambulatory Visit: Payer: Self-pay | Admitting: Emergency Medicine

## 2022-07-12 ENCOUNTER — Other Ambulatory Visit: Payer: Self-pay | Admitting: Student

## 2022-07-12 ENCOUNTER — Other Ambulatory Visit: Payer: Self-pay | Admitting: Primary Care

## 2022-07-20 ENCOUNTER — Encounter: Payer: Self-pay | Admitting: Hematology and Oncology

## 2022-07-21 DIAGNOSIS — J449 Chronic obstructive pulmonary disease, unspecified: Secondary | ICD-10-CM | POA: Diagnosis not present

## 2022-07-24 ENCOUNTER — Ambulatory Visit (HOSPITAL_BASED_OUTPATIENT_CLINIC_OR_DEPARTMENT_OTHER): Payer: PPO | Admitting: Family

## 2022-07-24 ENCOUNTER — Encounter (HOSPITAL_BASED_OUTPATIENT_CLINIC_OR_DEPARTMENT_OTHER): Payer: Self-pay | Admitting: Family

## 2022-07-24 VITALS — BP 124/62 | HR 107 | Ht 60.0 in | Wt 139.4 lb

## 2022-07-24 DIAGNOSIS — R600 Localized edema: Secondary | ICD-10-CM

## 2022-07-24 DIAGNOSIS — I10 Essential (primary) hypertension: Secondary | ICD-10-CM | POA: Diagnosis not present

## 2022-07-24 DIAGNOSIS — I2510 Atherosclerotic heart disease of native coronary artery without angina pectoris: Secondary | ICD-10-CM | POA: Diagnosis not present

## 2022-07-24 DIAGNOSIS — E785 Hyperlipidemia, unspecified: Secondary | ICD-10-CM

## 2022-07-24 MED ORDER — METOPROLOL TARTRATE 50 MG PO TABS: 50.0000 mg | ORAL_TABLET | Freq: Two times a day (BID) | ORAL | 3 refills | Status: DC

## 2022-07-24 MED ORDER — FUROSEMIDE 20 MG PO TABS: 20.0000 mg | ORAL_TABLET | ORAL | 0 refills | Status: DC | PRN

## 2022-07-24 MED ORDER — ATORVASTATIN CALCIUM 80 MG PO TABS: 80.0000 mg | ORAL_TABLET | Freq: Every day | ORAL | 3 refills | Status: DC

## 2022-07-24 NOTE — Progress Notes (Signed)
Cardiology Office Note:  .   Date:  07/24/2022  ID:  [name] [name], DOB 11-15-1945, MRN [id number] PCP: Shon Hale, MD  [facility] Providers Cardiologist:  Chilton Si, MD    History of Present Illness: .   [name] [name] is a 77 y.o. 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  Due to chest pain referred for coronary CTA 02/06/2017 revealing moderate diffuse calcification and atherosclerosis prominently in the descending thoracic aorta.  There is also 25-50% left main disease with 25-50% LAD disease with concern for intramyocardial bridge.  There was also greater than 70% left circumflex disease.  She underwent LHC 02/09/2017 with 10-40% nonobstructive disease.  No left main stenosis was noted.  She was last seen 07/27/2021 doing well from a cardiac perspective however as LDL was 96 atorvastatin increased to 80 mg daily.  She presents today for follow-up.  She notes some swelling in her bilateral feet that is the same all day long. She previously attributed them to her socks and does get a line from her ankle socks. Sits with legs in dependent position. Notes her breathing is worse when it is hot out. Presently on Prednisone at the direction iof her PCP. Most noticeable when up moving. No orthopnea, PND. Reports intermittent  chest pain across her chest lasting a couple minutes that occurs at rest and self resolves. Reassurance provided this is atypical for angina. .   ROS: Please see the history of present illness.    All other systems reviewed and are negative.   Studies Reviewed: Marland Kitchen   EKG Interpretation Date/Time:  Monday July 24 2022 08:23:46 EDT Ventricular Rate:  107 PR Interval:  132 QRS Duration:  70 QT Interval:  318 QTC Calculation: 424 R Axis:   52  Text Interpretation: Sinus tachycardia with PACs. No acute ST/T wave changes Confirmed by Gillian Shields ([id number]) on 07/24/2022 8:27:41 AM    Cardiac Studies & Procedures   CARDIAC CATHETERIZATION  CARDIAC CATHETERIZATION 03/01/2017  Narrative Conclusions: 1. Mild to moderate, non-obstructive CAD. No angiographic correlate to lesion described in proximal LCx on recent CTA; consider artifact on CT images. 2. Normal left ventricular contraction and filling pressure.  Recommendations: 1. Medical therapy and risk factor modification to prevent progression of CAD.  Yvonne Kendall, MD [facility] Pager: [phone]  Findings Coronary Findings Diagnostic  Dominance: Left  Left Main Vessel is large. Vessel is angiographically normal.  Left Anterior Descending Vessel is large. Prox LAD to Mid LAD lesion is 10% stenosed. The lesion is eccentric. The lesion is calcified. Mid LAD lesion is 30% stenosed. The lesion is eccentric.  First Diagonal Branch Vessel is small in size.  Second Diagonal Branch Vessel is moderate in size.  Left Circumflex Vessel is large. Vessel is angiographically normal.  First Obtuse Marginal Branch Vessel is moderate in size. Ost 1st Mrg lesion is 40% stenosed.  Second Obtuse Marginal Branch Vessel is small in size.  Left Posterior Descending Artery Vessel is small in size.  First Left Posterolateral Branch Vessel is small in size.  Second Left Posterolateral Branch Vessel is small in size.  Right Coronary Artery Vessel is small. Vessel is angiographically normal.  Intervention  No interventions have been documented.     ECHOCARDIOGRAM  ECHOCARDIOGRAM COMPLETE 07/27/2017  Narrative *[place]* *[facility]* [address] [place], [place] [address] [phone]  ------------------------------------------------------------------- Transthoracic Echocardiography  Patient:  [name], [name] MR #:       [id number] Study Date: 07/27/2017 Gender:     F Age:        71 Height:     154.9 cm Weight:      74.2 kg BSA:        1.82 m^2 Pt. Status: Room:       [id number]  SONOGRAPHER  Delcie Roch, RDCS, CCT ATTENDING    Loren Racer [id number] ADMITTING    Mariea Clonts, Ejiroghene E ORDERING     Emokpae, Ejiroghene E PERFORMING   [facility], Inpatient  cc:  ------------------------------------------------------------------- LV EF: 60% -   65%  ------------------------------------------------------------------- Indications:      Pulmonary embolus 415.19.  ------------------------------------------------------------------- History:   PMH:   Syncope.  Chronic obstructive pulmonary disease. Risk factors:  Hypertension. Diabetes mellitus.  ------------------------------------------------------------------- Study Conclusions  - Left ventricle: The cavity size was normal. Systolic function was normal. The estimated ejection fraction was in the range of 60% to 65%. Wall motion was normal; there were no regional wall motion abnormalities. Doppler parameters are consistent with abnormal left ventricular relaxation (grade 1 diastolic dysfunction). Doppler parameters are consistent with elevated ventricular end-diastolic filling pressure. - Aortic valve: Trileaflet; normal thickness leaflets. - Aortic root: The aortic root was normal in size. - Mitral valve: There was trivial regurgitation. - Right ventricle: The cavity size was normal. Wall thickness was normal. Systolic function was normal. - Right atrium: The atrium was normal in size. - Tricuspid valve: There was mild regurgitation. - Pulmonic valve: There was no regurgitation. - Pulmonary arteries: Systolic pressure was within the normal range. - Inferior vena cava: The vessel was normal in size. - Pericardium, extracardiac: There was no pericardial effusion.  ------------------------------------------------------------------- Study data:  Comparison was made to the study of 04/30/2005.  Study status:  Routine.  Procedure:  Transthoracic  echocardiography. Image quality was adequate.  Study completion:  There were no complications.          Transthoracic echocardiography.  M-mode, complete 2D, spectral Doppler, and color Doppler.  Birthdate: Patient birthdate: December 07, 1945.  Age:  Patient is 77 yr old.  Sex: Gender: female.    BMI: 30.9 kg/m^2.  Blood pressure:     119/66 Patient status:  Inpatient.  Study date:  Study date: 07/27/2017. Study time: 10:55 AM.  Location:  Bedside.  -------------------------------------------------------------------  ------------------------------------------------------------------- Left ventricle:  The cavity size was normal. Systolic function was normal. The estimated ejection fraction was in the range of 60% to 65%. Wall motion was normal; there were no regional wall motion abnormalities. Doppler parameters are consistent with abnormal left ventricular relaxation (grade 1 diastolic dysfunction). Doppler parameters are consistent with elevated ventricular end-diastolic filling pressure.  ------------------------------------------------------------------- Aortic valve:   Trileaflet; normal thickness leaflets. Mobility was not restricted.  Doppler:  Transvalvular velocity was within the normal range. There was no stenosis. There was no regurgitation.  ------------------------------------------------------------------- Aorta:  Aortic root: The aortic root was normal in size.  ------------------------------------------------------------------- Mitral valve:   Structurally normal valve.   Mobility was not restricted.  Doppler:  Transvalvular velocity was within the normal range. There was no evidence for stenosis. There was trivial regurgitation.    Peak gradient (D): 3 mm Hg.  ------------------------------------------------------------------- Left atrium:  The atrium was normal in size.  ------------------------------------------------------------------- Right ventricle:  The cavity  size was normal. Wall thickness was normal. Systolic function was normal.  ------------------------------------------------------------------- Pulmonic valve:    Structurally normal valve.   Cusp separation was normal.  Doppler:  Transvalvular velocity was within the normal range. There was no evidence for stenosis. There was no regurgitation.  ------------------------------------------------------------------- Tricuspid valve:   Structurally normal valve.    Doppler: Transvalvular velocity was within the normal range. There was mild regurgitation.  ------------------------------------------------------------------- Pulmonary artery:   The main pulmonary artery was normal-sized. Systolic pressure was within the normal range.  ------------------------------------------------------------------- Right atrium:  The atrium was normal in size.  ------------------------------------------------------------------- Pericardium:  There was no pericardial effusion.  ------------------------------------------------------------------- Systemic veins: Inferior vena cava: The vessel was normal in size.  ------------------------------------------------------------------- Measurements  Left ventricle                         Value        Reference LV ID, ED, PLAX chordal        (L)     35.2  mm     43 - 52 LV ID, ES, PLAX chordal                26.1  mm     23 - 38 LV fx shortening, PLAX chordal (L)     26    %      >=29 LV PW thickness, ED                    10.5  mm     ---------- IVS/LV PW ratio, ED                    0.88         <=1.3 Stroke volume, 2D                      48    ml     ---------- Stroke volume/bsa, 2D                  26    ml/m^2 ---------- LV e&', lateral                         8.38  cm/s   ---------- LV E/e&', lateral                       10.64        ---------- LV e&', medial                          6.48  cm/s   ---------- LV E/e&', medial                         13.77        ---------- LV e&', average                         7.43  cm/s   ---------- LV E/e&', average                       12.01        ----------  Ventricular septum                     Value        Reference IVS thickness, ED                      9.22  mm     ----------  LVOT                                   Value        Reference LVOT ID, S                             19    mm     ---------- LVOT area                              2.84  cm^2   ---------- LVOT peak velocity, S                  96.1  cm/s   ---------- LVOT mean velocity, S                  55.7  cm/s   ---------- LVOT VTI, S                            17    cm     ---------- LVOT peak gradient, S                  4     mm Hg  ----------  Aorta                                  Value        Reference Aortic root ID, ED                     31    mm     ----------  Left atrium                            Value        Reference LA ID, A-P, ES                         31    mm     ---------- LA ID/bsa, A-P                         1.71  cm/m^2 <=2.2 LA volume, S                           36.1  ml     ---------- LA volume/bsa, S                       19.9  ml/m^2 ---------- LA volume, ES, 1-p A4C                 34.7  ml     ---------- LA volume/bsa, ES, 1-p A4C             19.1  ml/m^2 ---------- LA volume, ES, 1-p A2C                 33.6  ml     ---------- LA volume/bsa, ES, 1-p A2C             18.5  ml/m^2 ----------  Mitral  valve                           Value        Reference Mitral E-wave peak velocity            89.2  cm/s   ---------- Mitral A-wave peak velocity            133   cm/s   ---------- Mitral peak gradient, D                3     mm Hg  ---------- Mitral E/A ratio, peak                 0.7          ----------  Right atrium                           Value        Reference RA ID, S-I, ES, A4C                    40.3  mm     34 - 49 RA area, ES, A4C                       11.3  cm^2   8.3 - 19.5 RA  volume, ES, A/L                     26.6  ml     ---------- RA volume/bsa, ES, A/L                 14.7  ml/m^2 ----------  Systemic veins                         Value        Reference Estimated CVP                          3     mm Hg  ----------  Right ventricle                        Value        Reference TAPSE                                  18.4  mm     ---------- RV s&', lateral, S                      13.2  cm/s   ----------  Legend: (L)  and  (H)  mark values outside specified reference range.  ------------------------------------------------------------------- Prepared and Electronically Authenticated by  Tobias Alexander, M.D. 2019-07-19T12:05:12     CT SCANS  CT CORONARY MORPH W/CTA COR W/SCORE 02/06/2017  Addendum 02/08/2017  6:31 PM ADDENDUM REPORT: 02/08/2017 18:28  CLINICAL DATA:  61 -year-old female with hypertension, hyperlipidemia and atypical chest pain.  EXAM: Cardiac/Coronary  CT  TECHNIQUE: The patient was scanned on a [employer].  FINDINGS: A 120 kV prospective scan was triggered in the descending thoracic aorta at 111 HU's. Axial non-contrast 3 mm slices were carried out through the heart. The data set was analyzed on a dedicated work station and  scored using the Agatson method. Gantry rotation speed was 250 msecs and collimation was .6 mm. 10 mg of iv Metoprolol and 0.8 mg of sl NTG was given. The 3D data set was reconstructed in 5% intervals of the 67-82 % of the R-R cycle. Diastolic phases were analyzed on a dedicated work station using MPR, MIP and VRT modes. The patient received 80 cc of contrast.  Aorta: Normal size. Moderate diffuse calcifications and atherosclerosis predominantly in the descending thoracic aorta. No dissection.  Aortic Valve:  Trileaflet.  No calcifications.  Coronary Arteries:  Normal coronary origin.  Left dominance.  Left main is a large artery that gives rise to LAD, a very small ramus intermedium  and LCX arteries. There is mild predominantly calcified plaque with associated stenosis 25-50%.  LAD is a large vessel that gives rise to two small diagonal branches. There is mild, long mixed, predominantly calcified plaque in the proximal to mid LAD with associated stenosis 25-50%. Mid LAD has in intramyocardial bridge.  D1 and D2 are small branches.  LCX is a dominant artery that gives rise to PLA and PDA. There is a severe predominantly non-calcified plaque in the very proximal portion of the LCX artery with associated stenosis 50-69% but possibly > 70%. Mid LCX has mild calcified plaque with associated stenosis 25-50%.  PDA is only visualized in the proximal portion and has no significant stenosis.  PLA is a small vessel with no obvious stenosis.  RCA is a small non-dominant artery that has no significant plaque.  Other findings:  Normal pulmonary vein drainage into the left atrium.  Normal let atrial appendage without a thrombus.  Dilated pulmonary artery measuring 34 mm suggestive of pulmonary hypertension.  IMPRESSION: 1. Coronary calcium score of 880. This was 38 percentile for age and sex matched control.  2. Normal coronary origin with left dominance.  3. 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 There is a severe predominantly non-calcified plaque in the very proximal portion of the LCX artery with associated stenosis 50-69% but possibly > 70%. Additional analysis with CT FFR will be performed.  4. Dilated pulmonary artery measuring 34 mm suggestive of pulmonary hypertension.   Electronically Signed By: Tobias Alexander On: 02/08/2017 18:28  Narrative EXAM: OVER-READ INTERPRETATION CT CHEST  The following report is an over-read performed by radiologist Dr. Hulan Saas of [facility] on 02/06/2017. This over-read does not include interpretation of cardiac or  coronary anatomy or pathology. The coronary calcium score/coronary CTA interpretation by the cardiologist is attached.  COMPARISON:  CT chest 05/03/2005.  FINDINGS: Vascular: Moderate atherosclerosis involving the descending thoracic aorta. Central pulmonary arteries patent.  Mediastinum/Nodes: Very small hiatal hernia. No pathologic lymphadenopathy within the visualized mediastinum. Visualized esophagus normal in appearance.  Lungs/Pleura: Mild emphysematous changes throughout both lungs. Visualized lung parenchyma clear. Central bronchi patent with mild bronchial wall thickening. No pleural effusions.  Upper Abdomen: Unremarkable for the early arterial phase of enhancement which accounts for the patchy enhancement of the spleen.  Musculoskeletal: Mild spondylosis involving the visualized thoracic spine.  IMPRESSION: 1. Aortic Atherosclerosis (ICD10-I70.0) and Emphysema (ICD10-J43.9). 2. Very small hiatal hernia.  Electronically Signed: By: Hulan Saas M.D. On: 02/06/2017 15:22          Risk Assessment/Calculations:             Physical Exam:   VS:  BP 124/62 (BP Location: Left Arm, Patient Position: Sitting,  Cuff Size: Normal)   Pulse (!) 107   Ht 5' (1.524 m)   Wt 139 lb 6.4 oz (63.2 kg)   BMI 27.22 kg/m    Wt Readings from Last 3 Encounters:  07/24/22 139 lb 6.4 oz (63.2 kg)  05/17/22 141 lb 6.4 oz (64.1 kg)  04/25/22 143 lb (64.9 kg)    GEN: Well nourished, well developed in no acute distress NECK: No JVD; No carotid bruits CARDIAC: RRR, no murmurs, rubs, gallops RESPIRATORY:  Clear to auscultation without rales, wheezing or rhonchi. On home O2.  ABDOMEN: Soft, non-tender, non-distended EXTREMITIES:  No edema; No deformity   ASSESSMENT AND PLAN: .    LE edema - Likely etiology venous insufficiency. Encouraged leg elevation, compression stockings. No orthopnea, PND suggestive of heart failure. Rx Lasix 20mg  as needed for edema. She will contact us if  she is taking more than twice per week. If LE edema worsens could consider echocardiogram.    Nonobstructive CAD/HLD, LDL goal less than 70- Stable with no anginal symptoms. Reassurance provided her intermittent chest pain at rest that self resolves is atypical for angina. EKG today no acute ST/T wave changes.  No indication for ischemic evaluation.   GDMT includes atorvastatin, metoprolol. Refills provided. Heart healthy diet and regular cardiovascular exercise encouraged.  03/21/22 total cholesterol 176, HDL 41, triglycerides 323, LDL 82, ALT 10, AST 14. LDL slightly above goal but will continue to work on lifestyle changes.   HTN-present antihypertensive regimen losartan 25 mg daily, Lopressor 50 mg twice daily. Discussed to monitor BP at home at least 2 hours after medications and sitting for 5-10 minutes.   History of DVT/PE/hypercoagulable state-previously unable to afford Xarelto.  Previously maintained on warfarin but her PCP has discontinued due to length of time since event.   COPD - Follows with Dr. Thora Lance of pulmonology.        Dispo: follow up in 1 year with Dr. Duke Salvia   Signed, Alver Sorrow, NP

## 2022-07-24 NOTE — Patient Instructions (Addendum)
Medication Instructions:  Your physician has recommended you make the following change in your medication:   START Furosemide (Lasix) as needed for swelling If taking more than twice per week please call and let us know   *If you need a refill on your cardiac medications before your next appointment, please call your pharmacy*  Testing/Procedures: Your EKG today looked good! It showed an occasional early beat called a PAC which is not dangerous but not of concern.   Follow-Up: At [facility], you and your health needs are our priority.  As part of our continuing mission to provide you with exceptional heart care, we have created designated Provider Care Teams.  These Care Teams include your primary Cardiologist (physician) and Advanced Practice Providers (APPs -  Physician Assistants and Nurse Practitioners) who all work together to provide you with the care you need, when you need it.  We recommend signing up for the patient portal called "MyChart".  Sign up information is provided on this After Visit Summary.  MyChart is used to connect with patients for Virtual Visits (Telemedicine).  Patients are able to view lab/test results, encounter notes, upcoming appointments, etc.  Non-urgent messages can be sent to your provider as well.   To learn more about what you can do with MyChart, go to [email or web address].    Your next appointment:   1 year(s)  Provider:   Chilton Si, MD or Alver Sorrow, NP    Other Instructions  To prevent or reduce lower extremity swelling: Eat a low salt diet. Salt makes the body hold onto extra fluid which causes swelling. Sit with legs elevated. For example, in the recliner or on an ottoman.  Wear knee-high compression stockings during the daytime. Ones labeled 15-20 mmHg provide good compression. They do make some with a zipper.    Chronic Venous Insufficiency Chronic venous insufficiency is a condition that causes the veins in the  legs to struggle to pump blood from the legs to the heart. It is also called venous stasis. This condition can happen when the vein walls are stretched, weakened, or damaged. It can also happen when the valves inside the vein are damaged. With the right treatment, you should be able to still lead an active life. What are the causes? Common causes of this condition include: Venous hypertension. This is high blood pressure inside the veins. Sitting or standing too long. This can cause increased blood pressure in the veins of the leg. Deep vein thrombosis (DVT). This is a blood clot that blocks blood flow in a vein. Phlebitis. This is inflammation of a vein. It can cause a blood clot to form. An abnormal growth of cells (tumor) in the area between your hip bones (pelvis). This can cause blood to back up. What increases the risk? Factors that may make you more likely to get this condition include: Having a family history of the condition. Being overweight. Being pregnant. Not getting enough exercise. Smoking. Having a job that requires you to sit or stand in one place for a long time. Being a certain age. Females in their 41s and 85s and males in their 36s are more likely to get this condition. What are the signs or symptoms? Symptoms of this condition include: Varicose veins. These are veins that are enlarged, bulging, or twisted. Skin breakdown or ulcers. Reddened skin or dark discoloration of the skin on the leg between the knee and ankle. Lipodermatosclerosis. This is brown, smooth, tight, and painful skin just  above the ankle. It is often on the inside of the leg. Swelling of the legs. How is this diagnosed? This condition may be diagnosed based on your medical history and a physical exam. You may also need tests, such as: A duplex ultrasound. This shows how blood flows through a blood vessel. Plethysmography. This tests blood flow. Venogram. This looks at the veins using an X-ray and  dye. How is this treated? The goals of treatment are to help you return to an active life and to relieve pain. Treatment may include: Wearing compression stockings. These do not cure the condition but can help relieve symptoms. They can also help stop your condition from getting worse. Sclerotherapy. This involves injecting a solution to shrink damaged veins. Surgery. This may include: Vein stripping. This is when a diseased vein is taken out. Laser ablation surgery. This is when blood flow is cut off through the vein. Repairing or remaking a valve inside the affected vein. Follow these instructions at home: Lifestyle Do not use any products that contain nicotine or tobacco. These products include cigarettes, chewing tobacco, and vaping devices, such as e-cigarettes. If you need help quitting, ask your health care provider. Stay active. Exercise, walk, or do other activities. Ask your provider what activities are safe for you. General instructions Take over-the-counter and prescription medicines only as told by your provider. Drink enough fluid to keep your pee (urine) pale yellow. Wear compression stockings as told by your provider. These stockings help to prevent blood clots and reduce swelling in your legs. Keep all follow-up visits. Your provider will check your legs for any changes and adjust your treatment plan as needed. Contact a health care provider if: You have redness, swelling, or more pain in the affected area. You see a red streak or line that goes up or down from the area. You have skin breakdown or skin loss. You get an injury in the affected area. You get a fever. Get help right away if: You have severe pain that does not get better with medicine. You get an injury and an open wound in the affected area. Your foot or ankle becomes numb or weak all of a sudden. You have trouble moving your foot or ankle. Your symptoms do not go away or get worse. You have chest  pain. You have shortness of breath. These symptoms may be an emergency. Get help right away. Call 911. Do not wait to see if the symptoms will go away. Do not drive yourself to the hospital. This information is not intended to replace advice given to you by your health care provider. Make sure you discuss any questions you have with your health care provider. Document Revised: 01/10/2022 Document Reviewed: 01/10/2022 Elsevier Patient Education  2024 [employer].

## 2022-07-28 DIAGNOSIS — R197 Diarrhea, unspecified: Secondary | ICD-10-CM | POA: Diagnosis not present

## 2022-08-02 ENCOUNTER — Telehealth: Payer: Self-pay | Admitting: Emergency Medicine

## 2022-08-02 MED ORDER — BREZTRI AEROSPHERE 160-9-4.8 MCG/ACT IN AERO: 2.0000 | INHALATION_SPRAY | Freq: Two times a day (BID) | RESPIRATORY_TRACT | 0 refills | Status: DC

## 2022-08-02 NOTE — Telephone Encounter (Signed)
Ray spoke with patient and advised her that I would leave 3 samples at the front desk for her. Samples have been placed up front.   Nothing further needed at time of call.

## 2022-08-02 NOTE — Telephone Encounter (Signed)
Pt called in bc she wants to see if she can get a sample of Breztri she is down to 2 puffs

## 2022-08-13 ENCOUNTER — Other Ambulatory Visit: Payer: Self-pay | Admitting: Emergency Medicine

## 2022-08-14 ENCOUNTER — Other Ambulatory Visit: Payer: Self-pay | Admitting: Emergency Medicine

## 2022-08-21 DIAGNOSIS — J449 Chronic obstructive pulmonary disease, unspecified: Secondary | ICD-10-CM | POA: Diagnosis not present

## 2022-09-19 DIAGNOSIS — F320 Major depressive disorder, single episode, mild: Secondary | ICD-10-CM | POA: Diagnosis not present

## 2022-09-19 DIAGNOSIS — J449 Chronic obstructive pulmonary disease, unspecified: Secondary | ICD-10-CM | POA: Diagnosis not present

## 2022-09-19 DIAGNOSIS — Z23 Encounter for immunization: Secondary | ICD-10-CM | POA: Diagnosis not present

## 2022-09-19 DIAGNOSIS — N1831 Chronic kidney disease, stage 3a: Secondary | ICD-10-CM | POA: Diagnosis not present

## 2022-09-19 DIAGNOSIS — I700 Atherosclerosis of aorta: Secondary | ICD-10-CM | POA: Diagnosis not present

## 2022-09-19 DIAGNOSIS — E039 Hypothyroidism, unspecified: Secondary | ICD-10-CM | POA: Diagnosis not present

## 2022-09-19 DIAGNOSIS — E1122 Type 2 diabetes mellitus with diabetic chronic kidney disease: Secondary | ICD-10-CM | POA: Diagnosis not present

## 2022-09-19 DIAGNOSIS — Z9981 Dependence on supplemental oxygen: Secondary | ICD-10-CM | POA: Diagnosis not present

## 2022-09-19 DIAGNOSIS — I10 Essential (primary) hypertension: Secondary | ICD-10-CM | POA: Diagnosis not present

## 2022-09-19 DIAGNOSIS — J9611 Chronic respiratory failure with hypoxia: Secondary | ICD-10-CM | POA: Diagnosis not present

## 2022-09-19 DIAGNOSIS — G250 Essential tremor: Secondary | ICD-10-CM | POA: Diagnosis not present

## 2022-09-19 DIAGNOSIS — E1121 Type 2 diabetes mellitus with diabetic nephropathy: Secondary | ICD-10-CM | POA: Diagnosis not present

## 2022-09-21 DIAGNOSIS — J449 Chronic obstructive pulmonary disease, unspecified: Secondary | ICD-10-CM | POA: Diagnosis not present

## 2022-10-03 ENCOUNTER — Other Ambulatory Visit (HOSPITAL_COMMUNITY): Payer: Self-pay

## 2022-10-03 ENCOUNTER — Ambulatory Visit: Payer: PPO | Admitting: Emergency Medicine

## 2022-10-03 ENCOUNTER — Encounter: Payer: Self-pay | Admitting: Emergency Medicine

## 2022-10-03 ENCOUNTER — Encounter: Payer: Self-pay | Admitting: Hematology and Oncology

## 2022-10-03 VITALS — BP 120/62 | HR 93 | Ht 60.0 in | Wt 132.2 lb

## 2022-10-03 DIAGNOSIS — J301 Allergic rhinitis due to pollen: Secondary | ICD-10-CM

## 2022-10-03 DIAGNOSIS — K219 Gastro-esophageal reflux disease without esophagitis: Secondary | ICD-10-CM | POA: Diagnosis not present

## 2022-10-03 DIAGNOSIS — J9611 Chronic respiratory failure with hypoxia: Secondary | ICD-10-CM

## 2022-10-03 DIAGNOSIS — R053 Chronic cough: Secondary | ICD-10-CM | POA: Diagnosis not present

## 2022-10-03 DIAGNOSIS — J441 Chronic obstructive pulmonary disease with (acute) exacerbation: Secondary | ICD-10-CM

## 2022-10-03 MED ORDER — FLUTICASONE PROPIONATE 50 MCG/ACT NA SUSP
2.0000 | Freq: Every day | NASAL | 3 refills | Status: DC
  Filled 2022-10-03: qty 48, 90d supply, fill #0

## 2022-10-03 MED ORDER — ALBUTEROL SULFATE HFA 108 (90 BASE) MCG/ACT IN AERS
2.0000 | INHALATION_SPRAY | RESPIRATORY_TRACT | 3 refills | Status: DC | PRN
  Filled 2022-10-03: qty 6.7, 16d supply, fill #0

## 2022-10-03 MED ORDER — BREZTRI AEROSPHERE 160-9-4.8 MCG/ACT IN AERO: 2.0000 | INHALATION_SPRAY | Freq: Two times a day (BID) | RESPIRATORY_TRACT | Status: AC

## 2022-10-03 NOTE — Assessment & Plan Note (Signed)
Continue tight control of her GERD, rhinitis.  Avoid powdered inhalers.

## 2022-10-03 NOTE — Assessment & Plan Note (Signed)
Continue her current oxygen at 3-4 L/min pulsed with exertion.

## 2022-10-03 NOTE — Assessment & Plan Note (Signed)
Becoming more active over the last week.  Always contributes to her chronic cough and dyspnea.  Add back loratadine to her fluticasone nasal spray

## 2022-10-03 NOTE — Assessment & Plan Note (Signed)
Please continue your Breztri 2 puffs twice a day.  Rinse and gargle after using. Keep albuterol available to use 2 puffs up to every 4 hours if needed for shortness of breath, chest tightness, wheezing.  Flu shot is up-to-date. Agree with getting the RSV vaccine this fall Consider getting the COVID-19 vaccine this fall Please follow with APP in 6 months Follow Dr. Delton Coombes in 1 year, sooner if you have any problems.

## 2022-10-03 NOTE — Patient Instructions (Signed)
Please continue your Breztri 2 puffs twice a day.  Rinse and gargle after using. Keep albuterol available to use 2 puffs up to every 4 hours if needed for shortness of breath, chest tightness, wheezing.  Flu shot is up-to-date. Agree with getting the RSV vaccine this fall Consider getting the COVID-19 vaccine this fall Continue your fluticasone nasal spray, 2 face each nostril once daily Restart loratadine 10 mg once daily.  You can get this over-the-counter Continue Nexium as you have been taking it Continue your oxygen at 4 L/min pulsed with exertion Please follow with APP in 6 months Follow Dr. Delton Coombes in 1 year, sooner if you have any problems.

## 2022-10-03 NOTE — Progress Notes (Signed)
Subjective:   Patient ID: [name] [name], female    DOB: [birth year], 77 y.o.   MRN: [id number]  HPI  Acute OV 05/11/2021 --77 years old with moderate COPD, chronic cough and upper airway irritation syndrome impacted by GERD and allergic rhinitis.  Also chronic hypoxemic respiratory failure due to mixed obstruction and restriction.  Currently managed on Breztri, Flonase, Nexium. Her allegra was just changed to an alternative.  Today she reports that she has been using 4L/min pulsed with exertion. She has had some progressive dyspnea, increased allergies and dry cough. She has been out of breztri x 3 days. Albuterol 2-4x a day.   ROV 10/03/2022 --pleasant 77 year old woman with a history of moderate COPD as well as upper airway irritation syndrome and chronic cough.  These are impacted by chronic GERD and allergic rhinitis.  She has associated chronic hypoxemic respiratory failure due to her obstructive disease as well as superimposed restriction from body habitus.  Oxygen is currently at liters per minute. Maintenance regimen is Breztri, Flonase, Nexium.  She is using albuterol 2-3x a day lately. She needs a new pharmacy.  She has been doing fairly well, has started to have some increased nasal congestion drainage and cough over the last week.   Objective:   Vitals:   10/03/22 1121  BP: 120/62  Pulse: 93  SpO2: 100%  Weight: 132 lb 3.2 oz (60 kg)  Height: 5' (1.524 m)    Gen: Pleasant, obese, in no distress,  normal affect  ENT: No lesions,  mouth clear,  oropharynx clear, no postnasal drip, strong voice today  Neck: No JVD, intermittent low pitched UA noise  Lungs: No use of accessory muscles, distant, no wheeze today  Cardiovascular: RRR, heart sounds normal, no murmur or gallops, no peripheral edema  Musculoskeletal: No deformities, no cyanosis or clubbing  Neuro: alert, non focal  Skin: Warm, no lesions or rashes   Assessment & Plan:  COPD (chronic obstructive pulmonary  disease) (HCC) Please continue your Breztri 2 puffs twice a day.  Rinse and gargle after using. Keep albuterol available to use 2 puffs up to every 4 hours if needed for shortness of breath, chest tightness, wheezing.  Flu shot is up-to-date. Agree with getting the RSV vaccine this fall Consider getting the COVID-19 vaccine this fall Please follow with APP in 6 months Follow Dr. Delton Coombes in 1 year, sooner if you have any problems.  Allergic rhinitis Becoming more active over the last week.  Always contributes to her chronic cough and dyspnea.  Add back loratadine to her fluticasone nasal spray  GERD (gastroesophageal reflux disease) Continue PPI as ordered  Cough Continue tight control of her GERD, rhinitis.  Avoid powdered inhalers.  Chronic respiratory failure with hypoxia (HCC) Continue her current oxygen at 3-4 L/min pulsed with exertion.       [name], MD, PhD 10/03/2022, 11:54 AM [facility] [phone] or if no answer [phone]

## 2022-10-03 NOTE — Assessment & Plan Note (Signed)
Continue PPI as ordered

## 2022-10-04 ENCOUNTER — Other Ambulatory Visit (HOSPITAL_BASED_OUTPATIENT_CLINIC_OR_DEPARTMENT_OTHER): Payer: Self-pay | Admitting: Family

## 2022-10-04 DIAGNOSIS — R600 Localized edema: Secondary | ICD-10-CM

## 2022-10-13 ENCOUNTER — Other Ambulatory Visit (HOSPITAL_COMMUNITY): Payer: Self-pay

## 2022-10-18 ENCOUNTER — Telehealth: Payer: Self-pay | Admitting: Emergency Medicine

## 2022-10-18 MED ORDER — DOXYCYCLINE HYCLATE 100 MG PO TABS: 100.0000 mg | ORAL_TABLET | Freq: Two times a day (BID) | ORAL | 0 refills | Status: DC

## 2022-10-18 MED ORDER — PREDNISONE 10 MG PO TABS: ORAL_TABLET | ORAL | 0 refills | Status: DC

## 2022-10-18 NOTE — Telephone Encounter (Signed)
PT needs a refill of Pred ( for a week or better) and anti-bx that Dr. regularly supplies for her to have on hand.  [facility] on [address] in [place].

## 2022-10-18 NOTE — Telephone Encounter (Signed)
Patient is aware of recommendations and voiced her understanding.  Doxy and prednisone sent to preferred pharmacy.  Nothing further needed.

## 2022-10-18 NOTE — Telephone Encounter (Signed)
Please give her doxycycline 100mg  bid x 7 days Prednisone > Take 40mg  daily for 3 days, then 30mg  daily for 3 days, then 20mg  daily for 3 days, then 10mg  daily for 3 days, then stop

## 2022-10-18 NOTE — Telephone Encounter (Signed)
Called and spoke to patient. C/o increased SOB,chills, prod cough with yellow sputum and wheezing x5d. Denied f/s or additional sx.  No recent covid test.  She is using albuterol solution TID, Breztri BID and albuterol HFA 2-3x daily.  Wears 3l cont. Spo2 maintaining around 94%.  Dr. Delton Coombes, please advise. Thanks

## 2022-10-21 DIAGNOSIS — J449 Chronic obstructive pulmonary disease, unspecified: Secondary | ICD-10-CM | POA: Diagnosis not present

## 2022-10-24 ENCOUNTER — Other Ambulatory Visit: Payer: Self-pay | Admitting: Pulmonary Disease

## 2022-11-13 ENCOUNTER — Emergency Department (HOSPITAL_BASED_OUTPATIENT_CLINIC_OR_DEPARTMENT_OTHER)
Admission: EM | Admit: 2022-11-13 | Discharge: 2022-11-13 | Disposition: A | Discharge status: Home / Self Care | Payer: PPO | Attending: Emergency Medicine | Admitting: Emergency Medicine

## 2022-11-13 ENCOUNTER — Other Ambulatory Visit: Payer: Self-pay

## 2022-11-13 ENCOUNTER — Encounter (HOSPITAL_BASED_OUTPATIENT_CLINIC_OR_DEPARTMENT_OTHER): Payer: Self-pay | Admitting: Emergency Medicine

## 2022-11-13 ENCOUNTER — Emergency Department (HOSPITAL_BASED_OUTPATIENT_CLINIC_OR_DEPARTMENT_OTHER): Payer: PPO

## 2022-11-13 DIAGNOSIS — M160 Bilateral primary osteoarthritis of hip: Secondary | ICD-10-CM | POA: Diagnosis not present

## 2022-11-13 DIAGNOSIS — Z7952 Long term (current) use of systemic steroids: Secondary | ICD-10-CM | POA: Insufficient documentation

## 2022-11-13 DIAGNOSIS — R0789 Other chest pain: Secondary | ICD-10-CM | POA: Insufficient documentation

## 2022-11-13 DIAGNOSIS — M25551 Pain in right hip: Secondary | ICD-10-CM | POA: Diagnosis not present

## 2022-11-13 DIAGNOSIS — R519 Headache, unspecified: Secondary | ICD-10-CM | POA: Insufficient documentation

## 2022-11-13 DIAGNOSIS — Z7951 Long term (current) use of inhaled steroids: Secondary | ICD-10-CM | POA: Diagnosis not present

## 2022-11-13 DIAGNOSIS — J449 Chronic obstructive pulmonary disease, unspecified: Secondary | ICD-10-CM | POA: Insufficient documentation

## 2022-11-13 DIAGNOSIS — Y92410 Unspecified street and highway as the place of occurrence of the external cause: Secondary | ICD-10-CM | POA: Diagnosis not present

## 2022-11-13 DIAGNOSIS — R079 Chest pain, unspecified: Secondary | ICD-10-CM | POA: Diagnosis not present

## 2022-11-13 DIAGNOSIS — S299XXA Unspecified injury of thorax, initial encounter: Secondary | ICD-10-CM | POA: Diagnosis not present

## 2022-11-13 DIAGNOSIS — I700 Atherosclerosis of aorta: Secondary | ICD-10-CM | POA: Diagnosis not present

## 2022-11-13 DIAGNOSIS — M25559 Pain in unspecified hip: Secondary | ICD-10-CM | POA: Diagnosis not present

## 2022-11-13 MED ORDER — PREDNISONE 50 MG PO TABS
50.0000 mg | ORAL_TABLET | Freq: Once | ORAL | Status: AC
  Administered 2022-11-13: 50 mg via ORAL
  Filled 2022-11-13: qty 1

## 2022-11-13 MED ORDER — ACETAMINOPHEN 325 MG PO TABS
650.0000 mg | ORAL_TABLET | Freq: Once | ORAL | Status: AC
  Administered 2022-11-13: 650 mg via ORAL
  Filled 2022-11-13: qty 2
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
## 2022-11-16 DIAGNOSIS — J441 Chronic obstructive pulmonary disease with (acute) exacerbation: Secondary | ICD-10-CM | POA: Diagnosis not present

## 2022-11-21 DIAGNOSIS — J449 Chronic obstructive pulmonary disease, unspecified: Secondary | ICD-10-CM | POA: Diagnosis not present

## 2022-12-18 DIAGNOSIS — J441 Chronic obstructive pulmonary disease with (acute) exacerbation: Secondary | ICD-10-CM | POA: Diagnosis not present

## 2022-12-18 DIAGNOSIS — R0602 Shortness of breath: Secondary | ICD-10-CM | POA: Diagnosis not present

## 2022-12-19 ENCOUNTER — Inpatient Hospital Stay (HOSPITAL_COMMUNITY)
Admission: EM | Admit: 2022-12-19 | Discharge: 2022-12-21 | DRG: 189 | Disposition: A | Discharge status: Home / Self Care | Payer: PPO | Attending: Internal Medicine | Admitting: Internal Medicine

## 2022-12-19 ENCOUNTER — Other Ambulatory Visit: Payer: Self-pay

## 2022-12-19 ENCOUNTER — Emergency Department (HOSPITAL_COMMUNITY): Payer: PPO

## 2022-12-19 DIAGNOSIS — E039 Hypothyroidism, unspecified: Secondary | ICD-10-CM | POA: Diagnosis not present

## 2022-12-19 DIAGNOSIS — J441 Chronic obstructive pulmonary disease with (acute) exacerbation: Secondary | ICD-10-CM | POA: Diagnosis not present

## 2022-12-19 DIAGNOSIS — Z823 Family history of stroke: Secondary | ICD-10-CM | POA: Diagnosis not present

## 2022-12-19 DIAGNOSIS — Z882 Allergy status to sulfonamides status: Secondary | ICD-10-CM

## 2022-12-19 DIAGNOSIS — Z8349 Family history of other endocrine, nutritional and metabolic diseases: Secondary | ICD-10-CM

## 2022-12-19 DIAGNOSIS — D72829 Elevated white blood cell count, unspecified: Secondary | ICD-10-CM | POA: Diagnosis present

## 2022-12-19 DIAGNOSIS — E1165 Type 2 diabetes mellitus with hyperglycemia: Secondary | ICD-10-CM | POA: Diagnosis present

## 2022-12-19 DIAGNOSIS — D649 Anemia, unspecified: Secondary | ICD-10-CM | POA: Diagnosis present

## 2022-12-19 DIAGNOSIS — E785 Hyperlipidemia, unspecified: Secondary | ICD-10-CM | POA: Diagnosis present

## 2022-12-19 DIAGNOSIS — D75839 Thrombocytosis, unspecified: Secondary | ICD-10-CM | POA: Diagnosis not present

## 2022-12-19 DIAGNOSIS — Z9049 Acquired absence of other specified parts of digestive tract: Secondary | ICD-10-CM

## 2022-12-19 DIAGNOSIS — T380X5A Adverse effect of glucocorticoids and synthetic analogues, initial encounter: Secondary | ICD-10-CM | POA: Diagnosis present

## 2022-12-19 DIAGNOSIS — J9621 Acute and chronic respiratory failure with hypoxia: Secondary | ICD-10-CM | POA: Diagnosis not present

## 2022-12-19 DIAGNOSIS — R0689 Other abnormalities of breathing: Secondary | ICD-10-CM | POA: Diagnosis not present

## 2022-12-19 DIAGNOSIS — R262 Difficulty in walking, not elsewhere classified: Secondary | ICD-10-CM | POA: Diagnosis not present

## 2022-12-19 DIAGNOSIS — K219 Gastro-esophageal reflux disease without esophagitis: Secondary | ICD-10-CM | POA: Diagnosis not present

## 2022-12-19 DIAGNOSIS — Z1152 Encounter for screening for COVID-19: Secondary | ICD-10-CM | POA: Diagnosis not present

## 2022-12-19 DIAGNOSIS — E669 Obesity, unspecified: Secondary | ICD-10-CM | POA: Diagnosis not present

## 2022-12-19 DIAGNOSIS — Z87891 Personal history of nicotine dependence: Secondary | ICD-10-CM | POA: Diagnosis not present

## 2022-12-19 DIAGNOSIS — Z9981 Dependence on supplemental oxygen: Secondary | ICD-10-CM

## 2022-12-19 DIAGNOSIS — Z8249 Family history of ischemic heart disease and other diseases of the circulatory system: Secondary | ICD-10-CM

## 2022-12-19 DIAGNOSIS — Z833 Family history of diabetes mellitus: Secondary | ICD-10-CM

## 2022-12-19 DIAGNOSIS — D638 Anemia in other chronic diseases classified elsewhere: Secondary | ICD-10-CM | POA: Diagnosis present

## 2022-12-19 DIAGNOSIS — I700 Atherosclerosis of aorta: Secondary | ICD-10-CM | POA: Diagnosis not present

## 2022-12-19 DIAGNOSIS — R0603 Acute respiratory distress: Secondary | ICD-10-CM | POA: Diagnosis not present

## 2022-12-19 DIAGNOSIS — Z79890 Hormone replacement therapy (postmenopausal): Secondary | ICD-10-CM

## 2022-12-19 DIAGNOSIS — I10 Essential (primary) hypertension: Secondary | ICD-10-CM | POA: Diagnosis present

## 2022-12-19 DIAGNOSIS — F32A Depression, unspecified: Secondary | ICD-10-CM | POA: Diagnosis present

## 2022-12-19 DIAGNOSIS — E119 Type 2 diabetes mellitus without complications: Secondary | ICD-10-CM

## 2022-12-19 DIAGNOSIS — Z7984 Long term (current) use of oral hypoglycemic drugs: Secondary | ICD-10-CM

## 2022-12-19 DIAGNOSIS — Z66 Do not resuscitate: Secondary | ICD-10-CM | POA: Diagnosis not present

## 2022-12-19 DIAGNOSIS — R739 Hyperglycemia, unspecified: Secondary | ICD-10-CM | POA: Diagnosis not present

## 2022-12-19 DIAGNOSIS — R0602 Shortness of breath: Secondary | ICD-10-CM | POA: Diagnosis not present

## 2022-12-19 DIAGNOSIS — E1169 Type 2 diabetes mellitus with other specified complication: Secondary | ICD-10-CM

## 2022-12-19 DIAGNOSIS — R000 Tachycardia, unspecified: Secondary | ICD-10-CM | POA: Diagnosis not present

## 2022-12-19 DIAGNOSIS — Z79899 Other long term (current) drug therapy: Secondary | ICD-10-CM

## 2022-12-19 DIAGNOSIS — I2510 Atherosclerotic heart disease of native coronary artery without angina pectoris: Secondary | ICD-10-CM | POA: Diagnosis not present

## 2022-12-19 DIAGNOSIS — Z7951 Long term (current) use of inhaled steroids: Secondary | ICD-10-CM

## 2022-12-19 DIAGNOSIS — Z888 Allergy status to other drugs, medicaments and biological substances status: Secondary | ICD-10-CM

## 2022-12-19 DIAGNOSIS — J449 Chronic obstructive pulmonary disease, unspecified: Secondary | ICD-10-CM | POA: Diagnosis not present

## 2022-12-19 LAB — CBC WITH DIFFERENTIAL/PLATELET
Abs Immature Granulocytes: 0.12 10*3/uL — ABNORMAL HIGH (ref 0.00–0.07)
Basophils Absolute: 0.1 10*3/uL (ref 0.0–0.1)
Basophils Relative: 0 %
Eosinophils Absolute: 0 10*3/uL (ref 0.0–0.5)
Eosinophils Relative: 0 %
HCT: 31.1 % — ABNORMAL LOW (ref 36.0–46.0)
Hemoglobin: 9.8 g/dL — ABNORMAL LOW (ref 12.0–15.0)
Immature Granulocytes: 1 %
Lymphocytes Relative: 8 %
Lymphs Abs: 1.8 10*3/uL (ref 0.7–4.0)
MCH: 29.7 pg (ref 26.0–34.0)
MCHC: 31.5 g/dL (ref 30.0–36.0)
MCV: 94.2 fL (ref 80.0–100.0)
Monocytes Absolute: 1.3 10*3/uL — ABNORMAL HIGH (ref 0.1–1.0)
Monocytes Relative: 6 %
Neutro Abs: 18.8 10*3/uL — ABNORMAL HIGH (ref 1.7–7.7)
Neutrophils Relative %: 85 %
Platelets: 582 10*3/uL — ABNORMAL HIGH (ref 150–400)
RBC: 3.3 MIL/uL — ABNORMAL LOW (ref 3.87–5.11)
RDW: 13.8 % (ref 11.5–15.5)
WBC: 22.2 10*3/uL — ABNORMAL HIGH (ref 4.0–10.5)
nRBC: 0 % (ref 0.0–0.2)

## 2022-12-19 LAB — BLOOD GAS, VENOUS
Acid-Base Excess: 4.8 mmol/L — ABNORMAL HIGH (ref 0.0–2.0)
Bicarbonate: 33.2 mmol/L — ABNORMAL HIGH (ref 20.0–28.0)
O2 Saturation: 51.9 %
Patient temperature: 37
pCO2, Ven: 66 mm[Hg] — ABNORMAL HIGH (ref 44–60)
pH, Ven: 7.31 (ref 7.25–7.43)
pO2, Ven: 34 mm[Hg] (ref 32–45)

## 2022-12-19 LAB — RESP PANEL BY RT-PCR (RSV, FLU A&B, COVID)  RVPGX2
Influenza A by PCR: NEGATIVE
Influenza B by PCR: NEGATIVE
Resp Syncytial Virus by PCR: NEGATIVE
SARS Coronavirus 2 by RT PCR: NEGATIVE

## 2022-12-19 LAB — TROPONIN I (HIGH SENSITIVITY)
Troponin I (High Sensitivity): 7 ng/L (ref <18)
Troponin I (High Sensitivity): 8 ng/L (ref <18)

## 2022-12-19 LAB — BASIC METABOLIC PANEL
Anion gap: 16 — ABNORMAL HIGH (ref 5–15)
BUN: 17 mg/dL (ref 8–23)
CO2: 29 mmol/L (ref 22–32)
Calcium: 8.9 mg/dL (ref 8.9–10.3)
Chloride: 92 mmol/L — ABNORMAL LOW (ref 98–111)
Creatinine, Ser: 1.1 mg/dL — ABNORMAL HIGH (ref 0.44–1.00)
GFR, Estimated: 51 mL/min — ABNORMAL LOW (ref >60)
Glucose, Bld: 279 mg/dL — ABNORMAL HIGH (ref 70–99)
Potassium: 3.8 mmol/L (ref 3.5–5.1)
Sodium: 137 mmol/L (ref 135–145)

## 2022-12-19 LAB — BRAIN NATRIURETIC PEPTIDE: B Natriuretic Peptide: 134.2 pg/mL — ABNORMAL HIGH (ref 0.0–100.0)

## 2022-12-19 MED ORDER — BUDESONIDE 0.25 MG/2ML IN SUSP
0.2500 mg | Freq: Two times a day (BID) | RESPIRATORY_TRACT | Status: DC
Start: 2022-12-19 — End: 2022-12-21
  Administered 2022-12-20 – 2022-12-21 (×3): 0.25 mg via RESPIRATORY_TRACT
  Filled 2022-12-19 (×3): qty 2

## 2022-12-19 MED ORDER — ATORVASTATIN CALCIUM 40 MG PO TABS
80.0000 mg | ORAL_TABLET | Freq: Every day | ORAL | Status: DC
  Administered 2022-12-20 – 2022-12-21 (×2): 80 mg via ORAL
  Filled 2022-12-19 (×2): qty 2

## 2022-12-19 MED ORDER — SENNOSIDES-DOCUSATE SODIUM 8.6-50 MG PO TABS: 1.0000 | ORAL_TABLET | Freq: Every evening | ORAL | Status: DC | PRN

## 2022-12-19 MED ORDER — ALBUTEROL SULFATE (2.5 MG/3ML) 0.083% IN NEBU
INHALATION_SOLUTION | RESPIRATORY_TRACT | Status: AC
  Administered 2022-12-19: 2.5 mg via RESPIRATORY_TRACT
  Filled 2022-12-19: qty 3

## 2022-12-19 MED ORDER — ONDANSETRON HCL 4 MG PO TABS: 4.0000 mg | ORAL_TABLET | Freq: Four times a day (QID) | ORAL | Status: DC | PRN

## 2022-12-19 MED ORDER — FERROUS SULFATE 325 (65 FE) MG PO TABS
325.0000 mg | ORAL_TABLET | Freq: Every day | ORAL | Status: DC
  Administered 2022-12-20 – 2022-12-21 (×2): 325 mg via ORAL
  Filled 2022-12-19 (×2): qty 1

## 2022-12-19 MED ORDER — IPRATROPIUM-ALBUTEROL 0.5-2.5 (3) MG/3ML IN SOLN: 3.0000 mL | Freq: Once | RESPIRATORY_TRACT | Status: AC

## 2022-12-19 MED ORDER — LEVALBUTEROL HCL 0.63 MG/3ML IN NEBU: 0.6300 mg | INHALATION_SOLUTION | Freq: Four times a day (QID) | RESPIRATORY_TRACT | Status: DC | PRN

## 2022-12-19 MED ORDER — ACETAMINOPHEN 650 MG RE SUPP: 650.0000 mg | Freq: Four times a day (QID) | RECTAL | Status: DC | PRN

## 2022-12-19 MED ORDER — ALBUTEROL SULFATE (2.5 MG/3ML) 0.083% IN NEBU: 2.5000 mg | INHALATION_SOLUTION | Freq: Once | RESPIRATORY_TRACT | Status: AC

## 2022-12-19 MED ORDER — LOSARTAN POTASSIUM 25 MG PO TABS
25.0000 mg | ORAL_TABLET | Freq: Every day | ORAL | Status: DC
  Administered 2022-12-20 – 2022-12-21 (×2): 25 mg via ORAL
  Filled 2022-12-19 (×2): qty 1

## 2022-12-19 MED ORDER — SODIUM CHLORIDE 0.9% FLUSH
3.0000 mL | Freq: Two times a day (BID) | INTRAVENOUS | Status: DC
  Administered 2022-12-20 (×2): 3 mL via INTRAVENOUS

## 2022-12-19 MED ORDER — INSULIN ASPART 100 UNIT/ML IJ SOLN
0.0000 [IU] | Freq: Three times a day (TID) | INTRAMUSCULAR | Status: DC
  Administered 2022-12-20 – 2022-12-21 (×3): 2 [IU] via SUBCUTANEOUS
  Filled 2022-12-19: qty 0.09

## 2022-12-19 MED ORDER — VENLAFAXINE HCL ER 75 MG PO CP24
150.0000 mg | ORAL_CAPSULE | Freq: Every day | ORAL | Status: DC
Start: 2022-12-20 — End: 2022-12-21
  Administered 2022-12-20 – 2022-12-21 (×2): 150 mg via ORAL
  Filled 2022-12-19 (×2): qty 1

## 2022-12-19 MED ORDER — IPRATROPIUM-ALBUTEROL 0.5-2.5 (3) MG/3ML IN SOLN
RESPIRATORY_TRACT | Status: AC
  Administered 2022-12-19: 3 mL via RESPIRATORY_TRACT
  Filled 2022-12-19: qty 3

## 2022-12-19 MED ORDER — SODIUM CHLORIDE 0.9 % IV SOLN: 500.0000 mg | Freq: Once | INTRAVENOUS | Status: DC

## 2022-12-19 MED ORDER — ENOXAPARIN SODIUM 40 MG/0.4ML IJ SOSY
40.0000 mg | PREFILLED_SYRINGE | INTRAMUSCULAR | Status: DC
  Administered 2022-12-20: 40 mg via SUBCUTANEOUS
  Filled 2022-12-19: qty 0.4

## 2022-12-19 MED ORDER — ONDANSETRON HCL 4 MG/2ML IJ SOLN: 4.0000 mg | Freq: Four times a day (QID) | INTRAMUSCULAR | Status: DC | PRN

## 2022-12-19 MED ORDER — ALBUTEROL SULFATE (2.5 MG/3ML) 0.083% IN NEBU
20.0000 mg | INHALATION_SOLUTION | Freq: Once | RESPIRATORY_TRACT | Status: AC
  Administered 2022-12-19: 20 mg via RESPIRATORY_TRACT

## 2022-12-19 MED ORDER — CEFTRIAXONE SODIUM 1 G IJ SOLR
1.0000 g | Freq: Once | INTRAMUSCULAR | Status: AC
  Administered 2022-12-19: 1 g via INTRAVENOUS
  Filled 2022-12-19: qty 10

## 2022-12-19 MED ORDER — ARFORMOTEROL TARTRATE 15 MCG/2ML IN NEBU
15.0000 ug | INHALATION_SOLUTION | Freq: Two times a day (BID) | RESPIRATORY_TRACT | Status: DC
  Administered 2022-12-20 – 2022-12-21 (×3): 15 ug via RESPIRATORY_TRACT
  Filled 2022-12-19 (×3): qty 2

## 2022-12-19 MED ORDER — SODIUM CHLORIDE 0.9 % IV BOLUS
1000.0000 mL | Freq: Once | INTRAVENOUS | Status: AC
  Administered 2022-12-19: 1000 mL via INTRAVENOUS

## 2022-12-19 MED ORDER — METHYLPREDNISOLONE SODIUM SUCC 40 MG IJ SOLR
40.0000 mg | Freq: Two times a day (BID) | INTRAMUSCULAR | Status: DC
  Administered 2022-12-20 – 2022-12-21 (×3): 40 mg via INTRAVENOUS
  Filled 2022-12-19 (×3): qty 1

## 2022-12-19 MED ORDER — ALBUTEROL SULFATE (2.5 MG/3ML) 0.083% IN NEBU
INHALATION_SOLUTION | RESPIRATORY_TRACT | Status: AC
  Filled 2022-12-19: qty 12

## 2022-12-19 MED ORDER — LEVOTHYROXINE SODIUM 50 MCG PO TABS
75.0000 ug | ORAL_TABLET | Freq: Every day | ORAL | Status: DC
  Administered 2022-12-20 – 2022-12-21 (×2): 75 ug via ORAL
  Filled 2022-12-19 (×2): qty 1

## 2022-12-19 MED ORDER — SODIUM CHLORIDE 0.9 % IV SOLN
500.0000 mg | Freq: Once | INTRAVENOUS | Status: AC
  Administered 2022-12-19: 500 mg via INTRAVENOUS
  Filled 2022-12-19: qty 5

## 2022-12-19 MED ORDER — ACETAMINOPHEN 325 MG PO TABS: 650.0000 mg | ORAL_TABLET | Freq: Four times a day (QID) | ORAL | Status: DC | PRN

## 2022-12-19 MED ORDER — INSULIN ASPART 100 UNIT/ML IJ SOLN
0.0000 [IU] | Freq: Every day | INTRAMUSCULAR | Status: DC
  Filled 2022-12-19: qty 0.05

## 2022-12-19 MED ORDER — PANTOPRAZOLE SODIUM 40 MG PO TBEC
40.0000 mg | DELAYED_RELEASE_TABLET | Freq: Two times a day (BID) | ORAL | Status: DC
  Administered 2022-12-20 – 2022-12-21 (×3): 40 mg via ORAL
  Filled 2022-12-19 (×3): qty 1

## 2022-12-19 NOTE — ED Notes (Signed)
Pt voided on the bedpan, she became restless stating that she wanted the bipap mask off and could not tolerate it.  Bipap placed on standby and pt placed on 2L [place].  She appears to be doing well, will continue to monitor.

## 2022-12-19 NOTE — Progress Notes (Signed)
   12/19/22 2222  BiPAP/CPAP /SiPAP Vitals  Bilateral Breath Sounds Diminished;Fine crackles   Pt. found off BiPAP V-60, currently on 2 lpm n/c-P:128, RR:32-34, remains A/O X3, 100% Sats and is alert/orientated X3, ED called indicating pt. stated, "She Can't Breath",  this RT found BiPAP removed prior to my arrival, Dr. Doran Durand, MD made aware with V.O. given for 5 mg Albuterol/0.5mg  Atrovent nebulizer which is being given, pt. tolerating well and wants to stay on current 2 lpm n/c for now, has diminished b/l b.s.with Insp. Crackles b/l.

## 2022-12-19 NOTE — Progress Notes (Signed)
   12/19/22 2047  BiPAP/CPAP/SIPAP  BiPAP/CPAP/SIPAP Pt Type Adult  BiPAP/CPAP/SIPAP V60  Mask Type Full face mask  Mask Size Medium  Set Rate 12 breaths/min  Respiratory Rate 36 breaths/min (In no apparent distress, able to speak full sentences, family member at bedside.)  IPAP 12 cmH20  EPAP 6 cmH2O  FiO2 (%) 32 %  Minute Ventilation 12.6  Leak 11  Peak Inspiratory Pressure (PIP) 12.6  Tidal Volume (Vt) 382  Patient Home Equipment No  Auto Titrate No  Press High Alarm 30 cmH2O  Press Low Alarm 5 cmH2O  Nasal massage performed Yes   Pt. Seen in ED:WA-15,CAT neb. finished/removed from BiPAP circuit, family member at bedside, pt. Tolerating well.

## 2022-12-19 NOTE — ED Provider Notes (Signed)
[place] EMERGENCY DEPARTMENT AT [facility] Provider Note   CSN: [id number] Arrival date & time: 12/19/22  1800     History Chief Complaint  Patient presents with   Respiratory Distress    HPI [name] [name] is a 77 y.o. female presenting for chief complaint shortness of breath.  77 year old female COPD on 3 L oxygen at baseline.  States that over the last 4 days she has had decompensation of her shortness of breath with acute worsening today. Endorses a productive cough. She uses Atrovent and DuoNebs at home but has not needed them up until today. Otherwise compliant on all medications.  Denies fevers chills nausea vomiting chest pain. EMS arrived.  Patient with 20 mg albuterol, 125 Solu-Medrol, 2 g magnesium and CPAP in transfer due to severity of dyspnea.  Oxygen saturations appropriate throughout the entirety of transport.  Patient's recorded medical, surgical, social, medication list and allergies were reviewed in the Snapshot window as part of the initial history.   Review of Systems   Review of Systems  Constitutional:  Positive for fatigue. Negative for chills and fever.  HENT:  Negative for ear pain and sore throat.   Eyes:  Negative for pain and visual disturbance.  Respiratory:  Positive for cough, shortness of breath and wheezing.   Cardiovascular:  Negative for chest pain and palpitations.  Gastrointestinal:  Negative for abdominal pain and vomiting.  Genitourinary:  Negative for dysuria and hematuria.  Musculoskeletal:  Negative for arthralgias and back pain.  Skin:  Negative for color change and rash.  Neurological:  Negative for seizures and syncope.  All other systems reviewed and are negative.   Physical Exam Updated Vital Signs BP (!) 182/90   Pulse (!) 125   Temp 97.9 F (36.6 C) (Axillary)   Resp (!) 57   Ht 5' (1.524 m)   Wt 59 kg   SpO2 99%   BMI 25.39 kg/m  Physical Exam Vitals and nursing note reviewed.  Constitutional:       General: She is not in acute distress.    Appearance: She is well-developed.  HENT:     Head: Normocephalic and atraumatic.  Eyes:     Conjunctiva/sclera: Conjunctivae normal.  Cardiovascular:     Rate and Rhythm: Normal rate and regular rhythm.     Heart sounds: No murmur heard. Pulmonary:     Effort: Respiratory distress present.     Breath sounds: No stridor. Wheezing and rhonchi present.  Abdominal:     General: There is no distension.     Palpations: Abdomen is soft.     Tenderness: There is no abdominal tenderness. There is no right CVA tenderness or left CVA tenderness.  Musculoskeletal:        General: No swelling or tenderness. Normal range of motion.     Cervical back: Neck supple.  Skin:    General: Skin is warm and dry.  Neurological:     General: No focal deficit present.     Mental Status: She is alert and oriented to person, place, and time. Mental status is at baseline.     Cranial Nerves: No cranial nerve deficit.      ED Course/ Medical Decision Making/ A&P    Procedures .Critical Care  Performed by: Glyn Ade, MD Authorized by: Glyn Ade, MD   Critical care provider statement:    Critical care time (minutes):  95   Critical care was necessary to treat or prevent imminent or life-threatening deterioration  of the following conditions:  Respiratory failure   Critical care was time spent personally by me on the following activities:  Development of treatment plan with patient or surrogate, discussions with consultants, evaluation of patient's response to treatment, examination of patient, ordering and review of laboratory studies, ordering and review of radiographic studies, ordering and performing treatments and interventions, pulse oximetry, re-evaluation of patient's condition and review of old charts   Care discussed with: admitting provider      Medications Ordered in ED Medications  albuterol (PROVENTIL) (2.5 MG/3ML) 0.083%  nebulizer solution 20 mg (20 mg Nebulization Given 12/19/22 1830)  sodium chloride 0.9 % bolus 1,000 mL (0 mLs Intravenous Stopped 12/19/22 2211)  cefTRIAXone (ROCEPHIN) 1 g in sodium chloride 0.9 % 100 mL IVPB (0 g Intravenous Stopped 12/19/22 2211)  azithromycin (ZITHROMAX) 500 mg in sodium chloride 0.9 % 250 mL IVPB (0 mg Intravenous Stopped 12/19/22 2211)  albuterol (PROVENTIL) (2.5 MG/3ML) 0.083% nebulizer solution (2.5 mg  Given 12/19/22 2222)  ipratropium-albuterol (DUONEB) 0.5-2.5 (3) MG/3ML nebulizer solution (3 mLs  Given 12/19/22 2222)    Medical Decision Making:   [name] [name] is a 77 y.o. female with a history of COPD, who presented to the ED today with acute on chronic SOB. They are endorsing worsening of their baseline dyspnea over the past 72 hours. Their baseline is a 3L O2 requirement. At their baseline they are able to get around the house and they are not able to at this time.   On my initial exam, the pt was SOB and tachypneic. Audible wheezing and grossly decreased breath sounds appreciated.  They are endorsing increased sputum production.    Reviewed and confirmed nursing documentation for past medical history, family history, social history.    Initial Assessment:   With the patient's presentation of SOB in the above setting, most likely diagnosis is COPD Exacerbation. Other diagnoses were considered including (but not limited to) CAP, PE, ACS, viral infection, PTX. These are considered less likely due to history of present illness and physical exam findings.   This is most consistent with an acute life/limb threatening illness complicated by underlying chronic conditions.  Initial Plan:  Empiric treatment of patient's symptoms with immediate initiation of inhaled bronchodilators and IV steroids. Given advanced nature of patient's presentation, will proceed with IV magnesium as a rescue therapy.   Given extremis nature of the presentation, patient required non  invasive ventilation initiation.  Evaluation for ACS with EKG and delta troponin  Evaluation for infectious versus intrathoracic abnormality with chest x-ray  Evaluation for volume overload with BNP  Screening labs including CBC and Metabolic panel to evaluate for infectious or metabolic etiology of disease.  Patient's Wells score is low and patient does not warrant further objective evaluation for PE based on consistency of presentation of alternative diagnosis.  Objective evaluation as below reviewed   Initial Study Results:   Laboratory  All laboratory results reviewed without evidence of clinically relevant pathology.   Exceptions include: Leukocytosis   EKG EKG was reviewed independently. Rate, rhythm, axis, intervals all examined and without medically relevant abnormality. ST segments without concerns for elevations.    Radiology:  All images reviewed independently. Agree with radiology report at this time.   DG Chest Portable 1 View  Result Date: 12/19/2022 CLINICAL DATA:  Shortness of breath EXAM: PORTABLE CHEST 1 VIEW COMPARISON:  11/13/2022 FINDINGS: Heart and mediastinal contours are within normal limits. No focal opacities or effusions. No acute bony abnormality. Aortic atherosclerosis.  IMPRESSION: No active disease. Electronically Signed   By: Charlett Nose M.D.   On: 12/19/2022 19:18    Final Assessment and Plan:   After initiation of medical therapies, patient is grossly improved and no longer in acute distress.    Disposition:   Based on the above findings, I believe this patient is stable for admission.    Patient/family educated about specific findings on our evaluation and explained exact reasons for admission.  Patient/family educated about clinical situation and time was allowed to answer questions.   Admission team communicated with and agreed with need for admission. Patient admitted. Patient  ready to move at this time.     Emergency Department Medication  Summary:   Medications  albuterol (PROVENTIL) (2.5 MG/3ML) 0.083% nebulizer solution 20 mg (20 mg Nebulization Given 12/19/22 1830)  sodium chloride 0.9 % bolus 1,000 mL (0 mLs Intravenous Stopped 12/19/22 2211)  cefTRIAXone (ROCEPHIN) 1 g in sodium chloride 0.9 % 100 mL IVPB (0 g Intravenous Stopped 12/19/22 2211)  azithromycin (ZITHROMAX) 500 mg in sodium chloride 0.9 % 250 mL IVPB (0 mg Intravenous Stopped 12/19/22 2211)  albuterol (PROVENTIL) (2.5 MG/3ML) 0.083% nebulizer solution (2.5 mg  Given 12/19/22 2222)  ipratropium-albuterol (DUONEB) 0.5-2.5 (3) MG/3ML nebulizer solution (3 mLs  Given 12/19/22 2222)     Clinical Impression:  1. Respiratory distress      Admit   Final Clinical Impression(s) / ED Diagnoses Final diagnoses:  Respiratory distress    Rx / DC Orders ED Discharge Orders     None         Glyn Ade, MD 12/19/22 2251

## 2022-12-19 NOTE — ED Notes (Signed)
Pt repositioned.  Pt continues to be on bipap.

## 2022-12-19 NOTE — H&P (Addendum)
History and Physical    [name] [name] HKV:[id number] DOB: 1945-07-31 DOA: 12/19/2022  PCP: Shon Hale, MD  Patient coming from: Home  I have personally briefly reviewed patient's old medical records in [facility]  Chief Complaint: Shortness of breath  HPI: [name] [name] is a 77 y.o. female with medical history significant for COPD, chronic respiratory failure with hypoxia on 3-4 L via [place], nonobstructive CAD, HTN, HLD, hypothyroidism, GERD who presented to the ED for evaluation of shortness of breath.  Patient states that her shortness of breath and COPD started acting up yesterday.  She use her home inhalers/nebulizers without relief.  Dyspnea significantly worsened earlier today.  She called EMS and was noted to be in respiratory distress only able to speak 1-2 hours at a time.  SpO2 reportedly was in the 60s on her home 3 L O2 via [place].  She was placed on CPAP by EMS.  She was given 22 mg albuterol treatment, 1 mg Atrovent, 125 mg Solu-Medrol, and 2 g magnesium en route to the ED.  At time of admitting exam patient states that dyspnea is significantly improving although still not back to baseline.  She is off BiPAP and back on home 3 L O2 via .  She reports increased cough frequency from baseline with new thick white sputum production.  She has had some fevers and chills.  Denies nausea, vomiting, abdominal pain.  Denies lower extremity swelling.  Patient lives at home alone.  ED Course  Labs/Imaging on admission: I have personally reviewed following labs and imaging studies.  Initial vitals showed BP 137/44, pulse 147, RR 37, temp 97.9 F, SpO2 98% while on BiPAP.  Labs showed WBC 22.2, hemoglobin 9.8, platelets 582,000, sodium 137, potassium 3.8, bicarb 29, BUN 17, creatinine 1.10, serum glucose 279, troponin negative x 2, BNP 134.2.  VBG showed pH 7.31, pCO2 66, pO2 34.  COVID, influenza, RSV PCR negative.  Blood cultures in process.  Portable chest  x-ray negative for focal consolidation, edema, effusion.  Patient was given 1 L normal saline, 20 mg albuterol nebulizer treatment, IV ceftriaxone and azithromycin.  The hospitalist service was consulted to admit for further evaluation and management.  Review of Systems: All systems reviewed and are negative except as documented in history of present illness above.   Past Medical History:  Diagnosis Date   Allergic rhinitis, cause unspecified    CAD in native artery 07/25/2017   Chronic airway obstruction, not elsewhere classified    COPD (chronic obstructive pulmonary disease) (HCC)    Depressive disorder, not elsewhere classified    Nephritis and nephropathy, not specified as acute or chronic, with unspecified pathological lesion in kidney    Obesity, unspecified    Other and unspecified hyperlipidemia    Oxygen deficiency    Thyrotoxicosis without mention of goiter or other cause, without mention of thyrotoxic crisis or storm    Unspecified essential hypertension    UTI (urinary tract infection) 2019 dec    Past Surgical History:  Procedure Laterality Date   BIOPSY  01/19/2021   Procedure: BIOPSY;  Surgeon: Shellia Cleverly, DO;  Location: [facility] ENDOSCOPY;  Service: Gastroenterology;;   CHOLECYSTECTOMY     ESOPHAGOGASTRODUODENOSCOPY (EGD) WITH PROPOFOL N/A 01/19/2021   Procedure: ESOPHAGOGASTRODUODENOSCOPY (EGD) WITH PROPOFOL;  Surgeon: Shellia Cleverly, DO;  Location: [facility] ENDOSCOPY;  Service: Gastroenterology;  Laterality: N/A;   LEFT HEART CATH AND CORONARY ANGIOGRAPHY N/A 03/01/2017   Procedure: LEFT HEART CATH AND CORONARY ANGIOGRAPHY;  Surgeon: Yvonne Kendall, MD;  Location: [facility] INVASIVE CV LAB;  Service: Cardiovascular;  Laterality: N/A;    Social History:  reports that she quit smoking about 28 years ago. Her smoking use included cigarettes. She started smoking about 62 years ago. She has a 68 pack-year smoking history. She has never used smokeless tobacco. She reports that  she does not drink alcohol and does not use drugs.  Allergies  Allergen Reactions   Lisinopril Cough    cough   Sulfonamide Derivatives Rash    Family History  Problem Relation Age of Onset   Heart failure Mother    Hyperlipidemia Mother    Hypertension Mother    Diabetes Mother    CAD Mother    Cancer Brother    Stroke Maternal Grandfather    Diabetes Brother      Prior to Admission medications   Medication Sig Start Date End Date Taking? Authorizing Provider  albuterol (VENTOLIN HFA) 108 (90 Base) MCG/ACT inhaler Inhale 2 puffs into the lungs every 4 (four) hours as needed for wheezing or shortness of breath. 10/03/22   Leslye Peer, MD  Ascorbic Acid (VITAMIN C) 1000 MG tablet Take 1,000 mg by mouth daily.    [provider]  atorvastatin (LIPITOR) 80 MG tablet Take 1 tablet (80 mg total) by mouth daily. 07/24/22   Alver Sorrow, NP  Budeson-Glycopyrrol-Formoterol (BREZTRI AEROSPHERE) 160-9-4.8 MCG/ACT AERO Inhale 2 puffs into the lungs in the morning and at bedtime. 10/03/22   Leslye Peer, MD  Calcium Carb-Cholecalciferol (CALCIUM 600+D3 PO) Take 1 tablet by mouth daily.    [provider]  Cholecalciferol (VITAMIN D-3 PO) Take 1 tablet by mouth daily.    [provider]  doxycycline (VIBRA-TABS) 100 MG tablet Take 1 tablet (100 mg total) by mouth 2 (two) times daily. 10/18/22   Leslye Peer, MD  esomeprazole (NEXIUM) 40 MG capsule TAKE 1 CAPSULE BY MOUTH 2 TIMES DAILY BEFORE A MEAL. 08/14/22   Leslye Peer, MD  ferrous sulfate 325 (65 FE) MG tablet Take 325 mg by mouth daily. 10/19/18   [provider]  fluticasone (FLONASE) 50 MCG/ACT nasal spray SPRAY 2 SPRAYS INTO EACH NOSTRIL EVERY DAY 10/26/22   Icard, Elige Radon L, DO  furosemide (LASIX) 20 MG tablet TAKE 1 TABLET BY MOUTH AS NEEDED 10/04/22   Alver Sorrow, NP  levothyroxine (SYNTHROID) 75 MCG tablet Take 1 tablet by mouth daily.    [provider]  losartan  (COZAAR) 25 MG tablet Take 25 mg by mouth daily.    [provider]  metFORMIN (GLUCOPHAGE-XR) 500 MG 24 hr tablet Take 500 mg by mouth every evening. 12/20/18   [provider]  metoprolol tartrate (LOPRESSOR) 50 MG tablet Take 1 tablet (50 mg total) by mouth 2 (two) times daily. 07/24/22   Alver Sorrow, NP  Multiple Vitamins-Minerals (CENTRUM PO) Take 1 tablet by mouth daily.    [provider]  nitroGLYCERIN (NITROSTAT) 0.4 MG SL tablet Place 1 tablet (0.4 mg total) under the tongue every 5 (five) minutes as needed for chest pain. 02/21/17 01/10/21  Abelino Derrick, PA-C  ONE TOUCH ULTRA TEST test strip As directed 05/18/11   [provider]  pantoprazole (PROTONIX) 40 MG tablet TAKE 1 TABLET BY MOUTH TWICE A DAY 06/16/22   Omar Person, MD  predniSONE (DELTASONE) 10 MG tablet 4tab x3d, 3tab x3d, 2tab x3d, 1tab x3d 10/18/22   Leslye Peer, MD  predniSONE (  DELTASONE) 20 MG tablet TAKE 1 TABLET BY MOUTH DAILY WITH BREAKFAST 07/13/22   Icard, Bradley L, DO  traZODone (DESYREL) 50 MG tablet Take 50 mg by mouth at bedtime.  02/28/13   [provider]  venlafaxine XR (EFFEXOR-XR) 150 MG 24 hr capsule Take 150 mg by mouth daily.  07/03/17   [provider]    Physical Exam: Vitals:   12/19/22 2100 12/19/22 2200 12/19/22 2210 12/19/22 2245  BP: (!) 104/56 (!) 182/90    Pulse: (!) 121 (!) 137 (!) 129 (!) 125  Resp: (!) 34 (!) 47 (!) 30 (!) 57  Temp:    98.4 F (36.9 C)  TempSrc:    Oral  SpO2: 98% 98% 97% 99%  Weight:      Height:       Constitutional: Elderly woman resting in bed with head elevated.  Eyes: EOMI, lids and conjunctivae normal ENMT: Mucous membranes are moist. Posterior pharynx clear of any exudate or lesions.Normal dentition.  Neck: normal, supple, no masses. Respiratory: Distant breath sounds with end expiratory wheezing.  Slightly increased respiratory effort while on 3 L O2 via [place]. No accessory muscle use.   Cardiovascular: Tachycardic, no murmurs / rubs / gallops. No extremity edema. 2+ pedal pulses. Abdomen: no tenderness, no masses palpated.  Musculoskeletal: no clubbing / cyanosis. No joint deformity upper and lower extremities. Good ROM, no contractures. Normal muscle tone.  Skin: no rashes, lesions, ulcers. No induration Neurologic: Sensation intact. Strength 5/5 in all 4.  Psychiatric: Normal judgment and insight. Alert and oriented x 3. Normal mood.   EKG: Personally reviewed. Sinus tachycardia, rate 121, motion artifact.  No acute ischemic changes.  Rate is faster when compared to previous.  Assessment/Plan Principal Problem:   Acute on chronic respiratory failure with hypoxia (HCC) Active Problems:   COPD with acute exacerbation (HCC)   Dyslipidemia   Essential hypertension   Normocytic anemia   Hypothyroidism   [name] [name] is a 77 y.o. female with medical history significant for COPD, chronic respiratory failure with hypoxia on 3-4 L via [place], nonobstructive CAD, HTN, HLD, hypothyroidism, GERD who is admitted with acute on chronic hypoxic respiratory failure due to COPD exacerbation.  Assessment and Plan: Acute on chronic hypoxic respiratory failure due to acute COPD exacerbation: Per report, SpO2 in the 60s while on home 3 L O2 via [place] PTA with EMS.  Required BiPAP in the ED, now trialing off on home 3 L.  Symptoms improving but not yet back to baseline. Wheezing still noted on exam.  Sinus tachycardia likely secondary to high-dose albuterol nebulizer treatments given prior to admit. -IV Solu-Medrol 40 mg twice daily -Scheduled Brovana/Pulmicort, Xopenex as needed -Continue 3 L supplemental O2 -BiPAP available as needed -Continue azithromycin  Leukocytosis: WBC 22.2 on admit.  CXR negative for evidence of pneumonia.  Likely some elevation related to steroid use.  Continue azithromycin and follow blood cultures.  Type 2 diabetes: Serum glucose 279, likely elevated due  to steroid administration.  Last hemoglobin A1c 6.9% in April 2022.  Not currently on medical management.  Will recheck A1c and place on SSI.  Chronic normocytic anemia: History of iron deficiency.  Hemoglobin is stable at 9.8.  Continue iron supplement.  Hypertension: Continue losartan.  Holding beta-blocker for now.  Hyperlipidemia: Continue atorvastatin.  Hypothyroidism: Continue Synthroid.  GERD: Continue Protonix.  Depression: Continue Effexor XR.   DVT prophylaxis: enoxaparin (LOVENOX) injection 40 mg Start: 12/20/22 1000 Code Status:   Code Status: Limited:  Do not attempt resuscitation (DNR) -DNR-LIMITED -Do Not Intubate/DNI Confirmed with patient on admission. Family Communication: Discussed with patient, she has discussed with family Disposition Plan: From home and likely discharge to home pending clinical progress Consults called: None Severity of Illness: The appropriate patient status for this patient is INPATIENT. Inpatient status is judged to be reasonable and necessary in order to provide the required intensity of service to ensure the patient's safety. The patient's presenting symptoms, physical exam findings, and initial radiographic and laboratory data in the context of their chronic comorbidities is felt to place them at high risk for further clinical deterioration. Furthermore, it is not anticipated that the patient will be medically stable for discharge from the hospital within 2 midnights of admission.   * I certify that at the point of admission it is my clinical judgment that the patient will require inpatient hospital care spanning beyond 2 midnights from the point of admission due to high intensity of service, high risk for further deterioration and high frequency of surveillance required.Darreld Mclean MD [facility]  If 7PM-7AM, please contact night-coverage [email or web address]  12/19/2022, 11:18 PM

## 2022-12-19 NOTE — Hospital Course (Signed)
[name] [name] is a 77 y.o. female with medical history significant for COPD, chronic respiratory failure with hypoxia on 3-4 L via [place], nonobstructive CAD, HTN, HLD, hypothyroidism, GERD who is admitted with acute on chronic hypoxic respiratory failure due to COPD exacerbation.

## 2022-12-19 NOTE — ED Triage Notes (Addendum)
Pt BIBA from home. C/o SOB for several hours.  On EMS arrival pt speaking 1-2 words at a time. EMS reports lung sounds almost absent on both sides  Placed on CPAP by EMS  Given 22 mg albuterol, 1 mg atrovent, 125 mg solu-medrol and 2 gm mag by EMS.  Satting 97 on 3L, which is her baseline O2.  BP: 152/76 HR: 140 CBG 252  18 RAC

## 2022-12-20 DIAGNOSIS — J9621 Acute and chronic respiratory failure with hypoxia: Secondary | ICD-10-CM | POA: Diagnosis not present

## 2022-12-20 LAB — BASIC METABOLIC PANEL
Anion gap: 11 (ref 5–15)
BUN: 20 mg/dL (ref 8–23)
CO2: 30 mmol/L (ref 22–32)
Calcium: 8.4 mg/dL — ABNORMAL LOW (ref 8.9–10.3)
Chloride: 97 mmol/L — ABNORMAL LOW (ref 98–111)
Creatinine, Ser: 1.26 mg/dL — ABNORMAL HIGH (ref 0.44–1.00)
GFR, Estimated: 44 mL/min — ABNORMAL LOW (ref >60)
Glucose, Bld: 325 mg/dL — ABNORMAL HIGH (ref 70–99)
Potassium: 3.5 mmol/L (ref 3.5–5.1)
Sodium: 138 mmol/L (ref 135–145)

## 2022-12-20 LAB — CBC
HCT: 26.8 % — ABNORMAL LOW (ref 36.0–46.0)
Hemoglobin: 8.4 g/dL — ABNORMAL LOW (ref 12.0–15.0)
MCH: 29.6 pg (ref 26.0–34.0)
MCHC: 31.3 g/dL (ref 30.0–36.0)
MCV: 94.4 fL (ref 80.0–100.0)
Platelets: 353 10*3/uL (ref 150–400)
RBC: 2.84 MIL/uL — ABNORMAL LOW (ref 3.87–5.11)
RDW: 13.9 % (ref 11.5–15.5)
WBC: 13.6 10*3/uL — ABNORMAL HIGH (ref 4.0–10.5)
nRBC: 0 % (ref 0.0–0.2)

## 2022-12-20 LAB — GLUCOSE, CAPILLARY
Glucose-Capillary: 408 mg/dL — ABNORMAL HIGH (ref 70–99)
Glucose-Capillary: 199 mg/dL — ABNORMAL HIGH (ref 70–99)
Glucose-Capillary: 188 mg/dL — ABNORMAL HIGH (ref 70–99)
Glucose-Capillary: 111 mg/dL — ABNORMAL HIGH (ref 70–99)
Glucose-Capillary: 196 mg/dL — ABNORMAL HIGH (ref 70–99)

## 2022-12-20 LAB — HEMOGLOBIN A1C
Hgb A1c MFr Bld: 6.8 % — ABNORMAL HIGH (ref 4.8–5.6)
Mean Plasma Glucose: 148.46 mg/dL

## 2022-12-20 MED ORDER — METOPROLOL TARTRATE 50 MG PO TABS
50.0000 mg | ORAL_TABLET | Freq: Two times a day (BID) | ORAL | Status: DC
  Administered 2022-12-20 – 2022-12-21 (×3): 50 mg via ORAL
  Filled 2022-12-20 (×3): qty 1

## 2022-12-20 MED ORDER — AZITHROMYCIN 250 MG PO TABS
500.0000 mg | ORAL_TABLET | ORAL | Status: DC
  Administered 2022-12-20: 500 mg via ORAL
  Filled 2022-12-20: qty 2

## 2022-12-20 MED ORDER — ENOXAPARIN SODIUM 30 MG/0.3ML IJ SOSY
30.0000 mg | PREFILLED_SYRINGE | INTRAMUSCULAR | Status: DC
  Administered 2022-12-21: 30 mg via SUBCUTANEOUS
  Filled 2022-12-20: qty 0.3

## 2022-12-20 MED ORDER — INSULIN ASPART 100 UNIT/ML IJ SOLN
6.0000 [IU] | Freq: Once | INTRAMUSCULAR | Status: AC
Start: 2022-12-20 — End: 2022-12-20
  Administered 2022-12-20: 6 [IU] via SUBCUTANEOUS

## 2022-12-20 NOTE — Progress Notes (Signed)
PROGRESS NOTE    [name] [name]  NWG:[id number] DOB: [birth year] DOA: 12/19/2022 PCP: Shon Hale, MD   Brief Narrative:  77 y.o. female with medical history significant for COPD, chronic respiratory failure with hypoxia on 3-4 L via nasal cannula, nonobstructive CAD, HTN, HLD, hypothyroidism, GERD presented with worsening shortness of breath.  On presentation, she was in significant respiratory distress and initially required BiPAP.  Workup showed WBCs of 22.2, negative COVID/influenza/RSV PCR with chest x-ray negative for focal consolidation/edema/effusion.  She was started on IV Solu-Medrol, antibiotics and given nebs.  Assessment & Plan:   Acute on chronic hypoxic respiratory failure COPD exacerbation -Normally wears 3 to 4 L oxygen via nasal cannula.  Required BiPAP initially in the ED.  Currently on 3 L oxygen via nasal cannula. -Workup as above. -Continue IV Solu-Medrol.  Continue nebs.  Continue Zithromax.  Leukocytosis -reactive.  Improving  Anemia of chronic disease -From chronic illnesses.  Hemoglobin stable.  Monitor intermittently  Thrombocytosis -Improved.  Diabetes mellitus type 2 with hyperglycemia -Continue CBGs with SSI.  Hypertension Hyperlipidemia -Continue losartan and statin.  Beta-blocker on hold.  Monitor blood pressure  Hypothyroidism -Continue levothyroxine  GERD -Continue Protonix  Depression -Continue venlafaxine XR     DVT prophylaxis: Lovenox Code Status: DNR Family Communication: None at bedside Disposition Plan: Status is: Inpatient Remains inpatient appropriate because: Of severity of illness.    Consultants: None  Procedures: None  Antimicrobials: Zithromax from 12/19/2022 onwards   Subjective: Patient seen and examined at bedside.  Still short of breath with exertion.  No fever, chest pain, vomiting reported.  Objective: Vitals:   12/20/22 0220 12/20/22 0323 12/20/22 0500 12/20/22 0627  BP:       Pulse: (!) 109     Resp: (!) 26     Temp:  97.8 F (36.6 C)  98 F (36.7 C)  TempSrc:  Axillary  Oral  SpO2:      Weight:   60.4 kg   Height:        Intake/Output Summary (Last 24 hours) at 12/20/2022 0754 Last data filed at 12/20/2022 0600 Gross per 24 hour  Intake 1000 ml  Output --  Net 1000 ml   Filed Weights   12/19/22 1814 12/20/22 0500  Weight: 59 kg 60.4 kg    Examination:  General exam: Appears calm and comfortable.  Looks chronically ill and deconditioned.  On 3 L oxygen by nasal cannula. Respiratory system: Bilateral decreased breath sounds at bases with scattered crackles Cardiovascular system: S1 & S2 heard, intermittently tachycardic gastrointestinal system: Abdomen is nondistended, soft and nontender. Normal bowel sounds heard. Extremities: No cyanosis, clubbing, edema  Central nervous system: Alert and oriented.  Slow to respond.  No focal neurological deficits. Moving extremities Skin: No rashes, lesions or ulcers Psychiatry: Flat affect.  Not agitated.   Data Reviewed: I have personally reviewed following labs and imaging studies  CBC: Recent Labs  Lab 12/19/22 1821 12/20/22 0406  WBC 22.2* 13.6*  NEUTROABS 18.8*  --   HGB 9.8* 8.4*  HCT 31.1* 26.8*  MCV 94.2 94.4  PLT 582* 353   Basic Metabolic Panel: Recent Labs  Lab 12/19/22 1821 12/20/22 0406  NA 137 138  K 3.8 3.5  CL 92* 97*  CO2 29 30  GLUCOSE 279* 325*  BUN 17 20  CREATININE 1.10* 1.26*  CALCIUM 8.9 8.4*   GFR: Estimated Creatinine Clearance: 29.9 mL/min (A) (by C-G formula based on SCr of 1.26 mg/dL (H)). Liver Function Tests:  No results for input(s): "AST", "ALT", "ALKPHOS", "BILITOT", "PROT", "ALBUMIN" in the last 168 hours. No results for input(s): "LIPASE", "AMYLASE" in the last 168 hours. No results for input(s): "AMMONIA" in the last 168 hours. Coagulation Profile: No results for input(s): "INR", "PROTIME" in the last 168 hours. Cardiac Enzymes: No results for  input(s): "CKTOTAL", "CKMB", "CKMBINDEX", "TROPONINI" in the last 168 hours. BNP (last 3 results) Recent Labs    04/25/22 1000  PROBNP 47.0   HbA1C: No results for input(s): "HGBA1C" in the last 72 hours. CBG: Recent Labs  Lab 12/20/22 0117 12/20/22 0729  GLUCAP 408* 199*   Lipid Profile: No results for input(s): "CHOL", "HDL", "LDLCALC", "TRIG", "CHOLHDL", "LDLDIRECT" in the last 72 hours. Thyroid Function Tests: No results for input(s): "TSH", "T4TOTAL", "FREET4", "T3FREE", "THYROIDAB" in the last 72 hours. Anemia Panel: No results for input(s): "VITAMINB12", "FOLATE", "FERRITIN", "TIBC", "IRON", "RETICCTPCT" in the last 72 hours. Sepsis Labs: No results for input(s): "PROCALCITON", "LATICACIDVEN" in the last 168 hours.  Recent Results (from the past 240 hour(s))  Resp panel by RT-PCR (RSV, Flu A&B, Covid) Anterior Nasal Swab     Status: None   Collection Time: 12/19/22  6:21 PM   Specimen: Anterior Nasal Swab  Result Value Ref Range Status   SARS Coronavirus 2 by RT PCR NEGATIVE NEGATIVE Final    Comment: (NOTE) SARS-CoV-2 target nucleic acids are NOT DETECTED.  The SARS-CoV-2 RNA is generally detectable in upper respiratory specimens during the acute phase of infection. The lowest concentration of SARS-CoV-2 viral copies this assay can detect is 138 copies/mL. A negative result does not preclude SARS-Cov-2 infection and should not be used as the sole basis for treatment or other patient management decisions. A negative result may occur with  improper specimen collection/handling, submission of specimen other than nasopharyngeal swab, presence of viral mutation(s) within the areas targeted by this assay, and inadequate number of viral copies(<138 copies/mL). A negative result must be combined with clinical observations, patient history, and epidemiological information. The expected result is Negative.  Fact Sheet for Patients:   BloggerCourse.com  Fact Sheet for Healthcare Providers:  SeriousBroker.it  This test is no t yet approved or cleared by the [place] FDA and  has been authorized for detection and/or diagnosis of SARS-CoV-2 by FDA under an Emergency Use Authorization (EUA). This EUA will remain  in effect (meaning this test can be used) for the duration of the COVID-19 declaration under Section 564(b)(1) of the Act, 21 U.S.C.section 360bbb-3(b)(1), unless the authorization is terminated  or revoked sooner.       Influenza A by PCR NEGATIVE NEGATIVE Final   Influenza B by PCR NEGATIVE NEGATIVE Final    Comment: (NOTE) The Xpert Xpress SARS-CoV-2/FLU/RSV plus assay is intended as an aid in the diagnosis of influenza from Nasopharyngeal swab specimens and should not be used as a sole basis for treatment. Nasal washings and aspirates are unacceptable for Xpert Xpress SARS-CoV-2/FLU/RSV testing.  Fact Sheet for Patients: BloggerCourse.com  Fact Sheet for Healthcare Providers: SeriousBroker.it  This test is not yet approved or cleared by the [place] FDA and has been authorized for detection and/or diagnosis of SARS-CoV-2 by FDA under an Emergency Use Authorization (EUA). This EUA will remain in effect (meaning this test can be used) for the duration of the COVID-19 declaration under Section 564(b)(1) of the Act, 21 U.S.C. section 360bbb-3(b)(1), unless the authorization is terminated or revoked.     Resp Syncytial Virus by PCR NEGATIVE NEGATIVE Final  Comment: (NOTE) Fact Sheet for Patients: BloggerCourse.com  Fact Sheet for Healthcare Providers: SeriousBroker.it  This test is not yet approved or cleared by the [place] FDA and has been authorized for detection and/or diagnosis of SARS-CoV-2 by FDA under an Emergency Use  Authorization (EUA). This EUA will remain in effect (meaning this test can be used) for the duration of the COVID-19 declaration under Section 564(b)(1) of the Act, 21 U.S.C. section 360bbb-3(b)(1), unless the authorization is terminated or revoked.  Performed at [facility], [address] [place], [place] [address]          Radiology Studies: DG Chest Portable 1 View  Result Date: 12/19/2022 CLINICAL DATA:  Shortness of breath EXAM: PORTABLE CHEST 1 VIEW COMPARISON:  11/13/2022 FINDINGS: Heart and mediastinal contours are within normal limits. No focal opacities or effusions. No acute bony abnormality. Aortic atherosclerosis. IMPRESSION: No active disease. Electronically Signed   By: Charlett Nose M.D.   On: 12/19/2022 19:18        Scheduled Meds:  arformoterol  15 mcg Nebulization BID   atorvastatin  80 mg Oral Daily   budesonide (PULMICORT) nebulizer solution  0.25 mg Nebulization BID   enoxaparin (LOVENOX) injection  40 mg Subcutaneous Q24H   ferrous sulfate  325 mg Oral Daily   insulin aspart  0-5 Units Subcutaneous QHS   insulin aspart  0-9 Units Subcutaneous TID WC   levothyroxine  75 mcg Oral Q0600   losartan  25 mg Oral Daily   methylPREDNISolone (SOLU-MEDROL) injection  40 mg Intravenous Q12H   pantoprazole  40 mg Oral BID   sodium chloride flush  3 mL Intravenous Q12H   venlafaxine XR  150 mg Oral Daily   Continuous Infusions:  azithromycin (ZITHROMAX) 500 mg in sodium chloride 0.9 % 250 mL IVPB            Glade Lloyd, MD [facility] 12/20/2022, 7:54 AM

## 2022-12-20 NOTE — ED Notes (Signed)
ED TO INPATIENT HANDOFF REPORT  ED Nurse Name and Phone #: Gearlean Alf Name/Age/Gender [name] [name] 77 y.o. female Room/Bed: WA15/WA15  Code Status   Code Status: Limited: Do not attempt resuscitation (DNR) -DNR-LIMITED -Do Not Intubate/DNI   Home/SNF/Other Home Patient oriented to: self, place, time, and situation Is this baseline? Yes   Triage Complete: Triage complete  Chief Complaint Acute on chronic respiratory failure with hypoxia (HCC) [J96.21]  Triage Note Pt BIBA from home. C/o SOB for several hours.  On EMS arrival pt speaking 1-2 words at a time. EMS reports lung sounds almost absent on both sides  Placed on CPAP by EMS  Given 22 mg albuterol, 1 mg atrovent, 125 mg solu-medrol and 2 gm mag by EMS.  Satting 97 on 3L, which is her baseline O2.  BP: 152/76 HR: 140 CBG 252  18 RAC   Allergies Allergies  Allergen Reactions   Lisinopril Cough    cough   Sulfonamide Derivatives Rash    Level of Care/Admitting Diagnosis ED Disposition     ED Disposition  Admit   Condition  --   Comment  Hospital Area: [facility] [[id number]]  Level of Care: Progressive [102]  Admit to Progressive based on following criteria: RESPIRATORY PROBLEMS hypoxemic/hypercapnic respiratory failure that is responsive to NIPPV (BiPAP) or High Flow Nasal Cannula (6-80 lpm). Frequent assessment/intervention, no > Q2 hrs < Q4 hrs, to maintain oxygenation and pulmonary hygiene.  May admit patient to [facility] or [facility] if equivalent level of care is available:: No  Covid Evaluation: Confirmed COVID Negative  Diagnosis: Acute on chronic respiratory failure with hypoxia [facility]) [[id number]]  Admitting Physician: Charlsie Quest [[id number]]  Attending Physician: Charlsie Quest [[id number]]  Certification:: I certify this patient will need inpatient services for at least 2 midnights  Expected Medical Readiness: 12/21/2022          B Medical/Surgery History Past  Medical History:  Diagnosis Date   Allergic rhinitis, cause unspecified    CAD in native artery 07/25/2017   Chronic airway obstruction, not elsewhere classified    COPD (chronic obstructive pulmonary disease) (HCC)    Depressive disorder, not elsewhere classified    Nephritis and nephropathy, not specified as acute or chronic, with unspecified pathological lesion in kidney    Obesity, unspecified    Other and unspecified hyperlipidemia    Oxygen deficiency    Thyrotoxicosis without mention of goiter or other cause, without mention of thyrotoxic crisis or storm    Unspecified essential hypertension    UTI (urinary tract infection) 2019 dec   Past Surgical History:  Procedure Laterality Date   BIOPSY  01/19/2021   Procedure: BIOPSY;  Surgeon: Shellia Cleverly, DO;  Location: [facility];  Service: Gastroenterology;;   CHOLECYSTECTOMY     ESOPHAGOGASTRODUODENOSCOPY (EGD) WITH PROPOFOL N/A 01/19/2021   Procedure: ESOPHAGOGASTRODUODENOSCOPY (EGD) WITH PROPOFOL;  Surgeon: Shellia Cleverly, DO;  Location: [facility];  Service: Gastroenterology;  Laterality: N/A;   LEFT HEART CATH AND CORONARY ANGIOGRAPHY N/A 03/01/2017   Procedure: LEFT HEART CATH AND CORONARY ANGIOGRAPHY;  Surgeon: Yvonne Kendall, MD;  Location: [facility] INVASIVE CV LAB;  Service: Cardiovascular;  Laterality: N/A;     A IV Location/Drains/Wounds Patient Lines/Drains/Airways Status     Active Line/Drains/Airways     Name Placement date Placement time Site Days   Peripheral IV 05/05/20 Right;Posterior Forearm 05/05/20  0940  Forearm  959  Intake/Output Last 24 hours  Intake/Output Summary (Last 24 hours) at 12/20/2022 0000 Last data filed at 12/19/2022 2211 Gross per 24 hour  Intake 1000 ml  Output --  Net 1000 ml    Labs/Imaging Results for orders placed or performed during the hospital encounter of 12/19/22 (from the past 48 hour(s))  CBC with Differential     Status: Abnormal   Collection  Time: 12/19/22  6:21 PM  Result Value Ref Range   WBC 22.2 (H) 4.0 - 10.5 K/uL   RBC 3.30 (L) 3.87 - 5.11 MIL/uL   Hemoglobin 9.8 (L) 12.0 - 15.0 g/dL   HCT 16.1 (L) 09.6 - 04.5 %   MCV 94.2 80.0 - 100.0 fL   MCH 29.7 26.0 - 34.0 pg   MCHC 31.5 30.0 - 36.0 g/dL   RDW 40.9 81.1 - 91.4 %   Platelets 582 (H) 150 - 400 K/uL   nRBC 0.0 0.0 - 0.2 %   Neutrophils Relative % 85 %   Neutro Abs 18.8 (H) 1.7 - 7.7 K/uL   Lymphocytes Relative 8 %   Lymphs Abs 1.8 0.7 - 4.0 K/uL   Monocytes Relative 6 %   Monocytes Absolute 1.3 (H) 0.1 - 1.0 K/uL   Eosinophils Relative 0 %   Eosinophils Absolute 0.0 0.0 - 0.5 K/uL   Basophils Relative 0 %   Basophils Absolute 0.1 0.0 - 0.1 K/uL   Immature Granulocytes 1 %   Abs Immature Granulocytes 0.12 (H) 0.00 - 0.07 K/uL    Comment: Performed at [facility], [address] [place], [place] [address]  Basic metabolic panel     Status: Abnormal   Collection Time: 12/19/22  6:21 PM  Result Value Ref Range   Sodium 137 135 - 145 mmol/L   Potassium 3.8 3.5 - 5.1 mmol/L   Chloride 92 (L) 98 - 111 mmol/L   CO2 29 22 - 32 mmol/L   Glucose, Bld 279 (H) 70 - 99 mg/dL    Comment: Glucose reference range applies only to samples taken after fasting for at least 8 hours.   BUN 17 8 - 23 mg/dL   Creatinine, Ser 6.21 (H) 0.44 - 1.00 mg/dL   Calcium 8.9 8.9 - 30.8 mg/dL   GFR, Estimated 51 (L) >60 mL/min    Comment: (NOTE) Calculated using the CKD-EPI Creatinine Equation (2021)    Anion gap 16 (H) 5 - 15    Comment: Performed at [facility], [address], [place], [place] [address]  Resp panel by RT-PCR (RSV, Flu A&B, Covid) Anterior Nasal Swab     Status: None   Collection Time: 12/19/22  6:21 PM   Specimen: Anterior Nasal Swab  Result Value Ref Range   SARS Coronavirus 2 by RT PCR NEGATIVE NEGATIVE    Comment: (NOTE) SARS-CoV-2 target nucleic acids are NOT DETECTED.  The SARS-CoV-2 RNA is generally detectable in  upper respiratory specimens during the acute phase of infection. The lowest concentration of SARS-CoV-2 viral copies this assay can detect is 138 copies/mL. A negative result does not preclude SARS-Cov-2 infection and should not be used as the sole basis for treatment or other patient management decisions. A negative result may occur with  improper specimen collection/handling, submission of specimen other than nasopharyngeal swab, presence of viral mutation(s) within the areas targeted by this assay, and inadequate number of viral copies(<138 copies/mL). A negative result must be combined with clinical observations, patient history, and epidemiological information. The expected  result is Negative.  Fact Sheet for Patients:  BloggerCourse.com  Fact Sheet for Healthcare Providers:  SeriousBroker.it  This test is no t yet approved or cleared by the [place] FDA and  has been authorized for detection and/or diagnosis of SARS-CoV-2 by FDA under an Emergency Use Authorization (EUA). This EUA will remain  in effect (meaning this test can be used) for the duration of the COVID-19 declaration under Section 564(b)(1) of the Act, 21 U.S.C.section 360bbb-3(b)(1), unless the authorization is terminated  or revoked sooner.       Influenza A by PCR NEGATIVE NEGATIVE   Influenza B by PCR NEGATIVE NEGATIVE    Comment: (NOTE) The Xpert Xpress SARS-CoV-2/FLU/RSV plus assay is intended as an aid in the diagnosis of influenza from Nasopharyngeal swab specimens and should not be used as a sole basis for treatment. Nasal washings and aspirates are unacceptable for Xpert Xpress SARS-CoV-2/FLU/RSV testing.  Fact Sheet for Patients: BloggerCourse.com  Fact Sheet for Healthcare Providers: SeriousBroker.it  This test is not yet approved or cleared by the [place] FDA and has been authorized  for detection and/or diagnosis of SARS-CoV-2 by FDA under an Emergency Use Authorization (EUA). This EUA will remain in effect (meaning this test can be used) for the duration of the COVID-19 declaration under Section 564(b)(1) of the Act, 21 U.S.C. section 360bbb-3(b)(1), unless the authorization is terminated or revoked.     Resp Syncytial Virus by PCR NEGATIVE NEGATIVE    Comment: (NOTE) Fact Sheet for Patients: BloggerCourse.com  Fact Sheet for Healthcare Providers: SeriousBroker.it  This test is not yet approved or cleared by the [place] FDA and has been authorized for detection and/or diagnosis of SARS-CoV-2 by FDA under an Emergency Use Authorization (EUA). This EUA will remain in effect (meaning this test can be used) for the duration of the COVID-19 declaration under Section 564(b)(1) of the Act, 21 U.S.C. section 360bbb-3(b)(1), unless the authorization is terminated or revoked.  Performed at [facility], [address] [place], [place] [address]   Troponin I (High Sensitivity)     Status: None   Collection Time: 12/19/22  6:21 PM  Result Value Ref Range   Troponin I (High Sensitivity) 7 <18 ng/L    Comment: (NOTE) Elevated high sensitivity troponin I (hsTnI) values and significant  changes across serial measurements may suggest ACS but many other  chronic and acute conditions are known to elevate hsTnI results.  Refer to the "Links" section for chest pain algorithms and additional  guidance. Performed at [facility], [address] [place], [place] [address]   Brain natriuretic peptide     Status: Abnormal   Collection Time: 12/19/22  6:21 PM  Result Value Ref Range   B Natriuretic Peptide 134.2 (H) 0.0 - 100.0 pg/mL    Comment: Performed at [facility], [address] [place], [place] [address]  Blood gas, venous     Status: Abnormal    Collection Time: 12/19/22  6:50 PM  Result Value Ref Range   pH, Ven 7.31 7.25 - 7.43   pCO2, Ven 66 (H) 44 - 60 mmHg   pO2, Ven 34 32 - 45 mmHg   Bicarbonate 33.2 (H) 20.0 - 28.0 mmol/L   Acid-Base Excess 4.8 (H) 0.0 - 2.0 mmol/L   O2 Saturation 51.9 %   Patient temperature 37.0     Comment: Performed at [facility], [address], [place], [place] [address]  Troponin I (  High Sensitivity)     Status: None   Collection Time: 12/19/22  7:57 PM  Result Value Ref Range   Troponin I (High Sensitivity) 8 <18 ng/L    Comment: (NOTE) Elevated high sensitivity troponin I (hsTnI) values and significant  changes across serial measurements may suggest ACS but many other  chronic and acute conditions are known to elevate hsTnI results.  Refer to the "Links" section for chest pain algorithms and additional  guidance. Performed at [facility], [address], [place], [place] [address]    DG Chest Portable 1 View  Result Date: 12/19/2022 CLINICAL DATA:  Shortness of breath EXAM: PORTABLE CHEST 1 VIEW COMPARISON:  11/13/2022 FINDINGS: Heart and mediastinal contours are within normal limits. No focal opacities or effusions. No acute bony abnormality. Aortic atherosclerosis. IMPRESSION: No active disease. Electronically Signed   By: Charlett Nose M.D.   On: 12/19/2022 19:18    Pending Labs Unresulted Labs (From admission, onward)     Start     Ordered   12/20/22 0500  CBC  Tomorrow morning,   R        12/19/22 2309   12/20/22 0500  Basic metabolic panel  Tomorrow morning,   R        12/19/22 2309   12/20/22 0500  Hemoglobin A1c  Tomorrow morning,   R       Comments: To assess prior glycemic control    12/19/22 2322   12/19/22 1919  Blood culture (routine x 2)  BLOOD CULTURE X 2,   R (with STAT occurrences)      12/19/22 1918            Vitals/Pain Today's Vitals   12/19/22 2200 12/19/22 2210 12/19/22 2245 12/19/22 2300  BP: (!) 182/90   (!)  146/67  Pulse: (!) 137 (!) 129 (!) 125 (!) 127  Resp: (!) 47 (!) 30 (!) 57 (!) 33  Temp:   98.4 F (36.9 C)   TempSrc:   Oral   SpO2: 98% 97% 99% 100%  Weight:      Height:      PainSc:        Isolation Precautions No active isolations  Medications Medications  azithromycin (ZITHROMAX) 500 mg in sodium chloride 0.9 % 250 mL IVPB (has no administration in time range)  enoxaparin (LOVENOX) injection 40 mg (has no administration in time range)  sodium chloride flush (NS) 0.9 % injection 3 mL (has no administration in time range)  acetaminophen (TYLENOL) tablet 650 mg (has no administration in time range)    Or  acetaminophen (TYLENOL) suppository 650 mg (has no administration in time range)  ondansetron (ZOFRAN) tablet 4 mg (has no administration in time range)    Or  ondansetron (ZOFRAN) injection 4 mg (has no administration in time range)  senna-docusate (Senokot-S) tablet 1 tablet (has no administration in time range)  levalbuterol (XOPENEX) nebulizer solution 0.63 mg (has no administration in time range)  arformoterol (BROVANA) nebulizer solution 15 mcg (has no administration in time range)  budesonide (PULMICORT) nebulizer solution 0.25 mg (has no administration in time range)  methylPREDNISolone sodium succinate (SOLU-MEDROL) 40 mg/mL injection 40 mg (has no administration in time range)  atorvastatin (LIPITOR) tablet 80 mg (has no administration in time range)  ferrous sulfate tablet 325 mg (has no administration in time range)  levothyroxine (SYNTHROID) tablet 75 mcg (has no administration in time range)  losartan (COZAAR) tablet 25 mg (has no administration in time range)  pantoprazole (PROTONIX) EC tablet 40 mg (has no administration in time range)  venlafaxine XR (EFFEXOR-XR) 24 hr capsule 150 mg (has no administration in time range)  insulin aspart (novoLOG) injection 0-9 Units (has no administration in time range)  insulin aspart (novoLOG) injection 0-5 Units (has no  administration in time range)  albuterol (PROVENTIL) (2.5 MG/3ML) 0.083% nebulizer solution 20 mg (20 mg Nebulization Given 12/19/22 1830)  sodium chloride 0.9 % bolus 1,000 mL (0 mLs Intravenous Stopped 12/19/22 2211)  cefTRIAXone (ROCEPHIN) 1 g in sodium chloride 0.9 % 100 mL IVPB (0 g Intravenous Stopped 12/19/22 2211)  azithromycin (ZITHROMAX) 500 mg in sodium chloride 0.9 % 250 mL IVPB (0 mg Intravenous Stopped 12/19/22 2211)  ipratropium-albuterol (DUONEB) 0.5-2.5 (3) MG/3ML nebulizer solution 3 mL (3 mLs Nebulization Given 12/19/22 2222)  albuterol (PROVENTIL) (2.5 MG/3ML) 0.083% nebulizer solution 2.5 mg (2.5 mg Nebulization Given 12/19/22 2222)    Mobility walks     Focused Assessments Pulmonary Assessment Handoff:  Lung sounds: Bilateral Breath Sounds: Diminished, Fine crackles O2 Device: Nasal Cannula O2 Flow Rate (L/min): 3 L/min  Pt is on 3L [place] home o2, required bipap on arrival to ED but is now on 3L [place] and doing well  R Recommendations: See Admitting Provider Note  Report given to:   Additional Notes:

## 2022-12-20 NOTE — Progress Notes (Signed)
   12/20/22 0200  BiPAP/CPAP/SIPAP  BiPAP/CPAP/SIPAP Pt Type Adult  BiPAP/CPAP/SIPAP V60  Reason BIPAP/CPAP not in use Other(comment) (BiPAP-PRN upon admission-)   Pt. Used BiPAP V-60 while in ED, currently ordered PRN, and not in room upon admission, available if needed.

## 2022-12-20 NOTE — Plan of Care (Signed)
  Problem: Education: Goal: Knowledge of General Education information will improve Description: Including pain rating scale, medication(s)/side effects and non-pharmacologic comfort measures Outcome: Progressing   Problem: Safety: Goal: Ability to remain free from injury will improve Outcome: Progressing   

## 2022-12-21 ENCOUNTER — Encounter (HOSPITAL_COMMUNITY): Payer: Self-pay | Admitting: Internal Medicine

## 2022-12-21 DIAGNOSIS — J9621 Acute and chronic respiratory failure with hypoxia: Secondary | ICD-10-CM | POA: Diagnosis not present

## 2022-12-21 LAB — CBC WITH DIFFERENTIAL/PLATELET
Abs Immature Granulocytes: 0.23 10*3/uL — ABNORMAL HIGH (ref 0.00–0.07)
Basophils Absolute: 0 10*3/uL (ref 0.0–0.1)
Basophils Relative: 0 %
Eosinophils Absolute: 0 10*3/uL (ref 0.0–0.5)
Eosinophils Relative: 0 %
HCT: 27.2 % — ABNORMAL LOW (ref 36.0–46.0)
Hemoglobin: 8.2 g/dL — ABNORMAL LOW (ref 12.0–15.0)
Immature Granulocytes: 1 %
Lymphocytes Relative: 2 %
Lymphs Abs: 0.4 10*3/uL — ABNORMAL LOW (ref 0.7–4.0)
MCH: 29 pg (ref 26.0–34.0)
MCHC: 30.1 g/dL (ref 30.0–36.0)
MCV: 96.1 fL (ref 80.0–100.0)
Monocytes Absolute: 0.6 10*3/uL (ref 0.1–1.0)
Monocytes Relative: 3 %
Neutro Abs: 19.3 10*3/uL — ABNORMAL HIGH (ref 1.7–7.7)
Neutrophils Relative %: 94 %
Platelets: 454 10*3/uL — ABNORMAL HIGH (ref 150–400)
RBC: 2.83 MIL/uL — ABNORMAL LOW (ref 3.87–5.11)
RDW: 14 % (ref 11.5–15.5)
WBC: 20.5 10*3/uL — ABNORMAL HIGH (ref 4.0–10.5)
nRBC: 0 % (ref 0.0–0.2)

## 2022-12-21 LAB — GLUCOSE, CAPILLARY
Glucose-Capillary: 180 mg/dL — ABNORMAL HIGH (ref 70–99)
Glucose-Capillary: 170 mg/dL — ABNORMAL HIGH (ref 70–99)

## 2022-12-21 LAB — BASIC METABOLIC PANEL
Anion gap: 9 (ref 5–15)
BUN: 28 mg/dL — ABNORMAL HIGH (ref 8–23)
CO2: 30 mmol/L (ref 22–32)
Calcium: 8.4 mg/dL — ABNORMAL LOW (ref 8.9–10.3)
Chloride: 101 mmol/L (ref 98–111)
Creatinine, Ser: 1.04 mg/dL — ABNORMAL HIGH (ref 0.44–1.00)
GFR, Estimated: 55 mL/min — ABNORMAL LOW (ref >60)
Glucose, Bld: 201 mg/dL — ABNORMAL HIGH (ref 70–99)
Potassium: 4.5 mmol/L (ref 3.5–5.1)
Sodium: 140 mmol/L (ref 135–145)

## 2022-12-21 LAB — MAGNESIUM: Magnesium: 2.4 mg/dL (ref 1.7–2.4)

## 2022-12-21 MED ORDER — PREDNISONE 20 MG PO TABS: 40.0000 mg | ORAL_TABLET | Freq: Every day | ORAL | 0 refills | Status: AC

## 2022-12-21 MED ORDER — ESOMEPRAZOLE MAGNESIUM 40 MG PO CPDR: 40.0000 mg | DELAYED_RELEASE_CAPSULE | Freq: Every day | ORAL | Status: DC

## 2022-12-21 MED ORDER — FLUTICASONE PROPIONATE 50 MCG/ACT NA SUSP: 1.0000 | Freq: Two times a day (BID) | NASAL | Status: AC

## 2022-12-21 NOTE — Evaluation (Signed)
Physical Therapy Brief Evaluation and Discharge Note Patient Details Name: [name] [name] MRN: [id number] DOB: 10/03/1945 Today's Date: 12/21/2022   History of Present Illness  77 y.o. female presented with worsening shortness of breath. On presentation, she was in significant respiratory distress and initially required BiPAP. Dx of acute on chronic respiratory failure. Pt with medical history significant for COPD, chronic respiratory failure with hypoxia on 3-4 L via nasal cannula, nonobstructive CAD, HTN, HLD, hypothyroidism, GERD  Clinical Impression  Pt is mobilizing well, she ambulated 130' with RW, no loss of balance, SpO2 94% on 3L O2 walking. Pt reports she's on 3-4L O2 at home at baseline. She would benefit from a rollator for home. Pt is ready to DC home from a PT standpoint. PT signing off as pt is mobilizing well independently.      PT Assessment    Assistance Needed at Discharge       Equipment Recommendations Rollator (4 wheels)  Recommendations for Other Services       Precautions/Restrictions Precautions Precautions: Other (comment) Precaution Comments: on O2 Restrictions Weight Bearing Restrictions Per Provider Order: No        Mobility  Bed Mobility          Transfers Overall transfer level: Independent                      Ambulation/Gait Ambulation/Gait assistance: Modified independent (Device/Increase time) Gait Distance (Feet): 130 Feet Assistive device: Rolling walker (2 wheels) Gait Pattern/deviations: WFL(Within Functional Limits)   General Gait Details: steady, no loss of balance, SpO2 94% on 3L O2 walking, no dyspnea noted; pt did walk ~60' without RW without LOB but stated she felt more secure with RW. Placed order for rollator for home.  Home Activity Instructions    Stairs            Modified Rankin (Stroke Patients Only)        Balance                          Pertinent Vitals/Pain   Pain  Assessment Pain Assessment: No/denies pain     Home Living   Living Arrangements: Alone       Home Equipment: Cane - single point;Shower seat        Prior Function        UE/LE Assessment               Communication   Communication Communication: No apparent difficulties     Cognition         General Comments      Exercises     Assessment/Plan    PT Problem List         PT Visit Diagnosis      No Skilled PT Patient is modified independent with all activity/mobility   Co-evaluation                AMPAC 6 Clicks Help needed turning from your back to your side while in a flat bed without using bedrails?: None Help needed moving from lying on your back to sitting on the side of a flat bed without using bedrails?: None Help needed moving to and from a bed to a chair (including a wheelchair)?: None Help needed standing up from a chair using your arms (e.g., wheelchair or bedside chair)?: None Help needed to walk in hospital room?: None Help needed climbing 3-5 steps with a railing? :  None 6 Click Score: 24      End of Session Equipment Utilized During Treatment: Gait belt;Oxygen Activity Tolerance: Patient tolerated treatment well Patient left: in bed;with call bell/phone within reach;with bed alarm set Nurse Communication: Mobility status       Time: 1014-1030 PT Time Calculation (min) (ACUTE ONLY): 16 min  Charges:   PT Evaluation $PT Eval Moderate Complexity: 1 Mod      Tamala Ser PT 12/21/2022  Acute Rehabilitation Services  Office [phone]

## 2022-12-21 NOTE — TOC Transition Note (Addendum)
Transition of Care [facility]) - Discharge Note   Patient Details  Name: [name] [name] MRN: [id number] Date of Birth: Sep 08, 1945  [facility]) CM/SW Contact:  Larrie Kass, LCSW Phone Number: 12/21/2022, 10:54 AM   Clinical Narrative:    Pt to d/c home, CSW discuss recommendations for a rollator, pt has agreed. Pt has no preference in DME company. CSW sent referral to rep with [employer] , DME to be delivered to pt's room before d/c. Pt reports having portable O2 with her for d/c and a friend will provide transportation. TOC sign off.    Final next level of care: Home/Self Care Barriers to Discharge: No Barriers Identified   Patient Goals and CMS Choice Patient states their goals for this hospitalization and ongoing recovery are:: retrun home CMS Medicare.gov Compare Post Acute Care list provided to:: Patient Choice offered to / list presented to : Patient      Discharge Placement                    Patient and family notified of of transfer: 12/21/22  Discharge Plan and Services Additional resources added to the After Visit Summary for                  DME Arranged: Walker rolling with seat DME Agency: [employer] Date DME Agency Contacted: 12/21/22 Time DME Agency Contacted: 1054 Representative spoke with at DME Agency: [employer]            Social Drivers of Health (SDOH) Interventions SDOH Screenings   Food Insecurity: No Food Insecurity (12/20/2022)  Housing: Patient Declined (12/20/2022)  Transportation Needs: No Transportation Needs (12/20/2022)  Utilities: Not At Risk (12/20/2022)  Depression (PHQ2-9): Low Risk  (07/25/2019)  Tobacco Use: Medium Risk (12/21/2022)     Readmission Risk Interventions     No data to display

## 2022-12-21 NOTE — Discharge Summary (Signed)
Physician Discharge Summary  [name] [name] ZOX:[id number] DOB: [birth year] DOA: 12/19/2022  PCP: Shon Hale, MD  Admit date: 12/19/2022 Discharge date: 12/21/2022  Admitted From: Home Disposition: Home  Recommendations for Outpatient Follow-up:  Follow up with PCP in 1 week with repeat CBC/BMP Outpatient follow-up with pulmonary Follow up in ED if symptoms worsen or new appear   Home Health: No Equipment/Devices: Continue supplemental oxygen by nasal cannula  Discharge Condition: Stable CODE STATUS: Full Diet recommendation: Heart healthy/carb modified  Brief/Interim Summary: 77 y.o. female with medical history significant for COPD, chronic respiratory failure with hypoxia on 3-4 L via nasal cannula, nonobstructive CAD, HTN, HLD, hypothyroidism, GERD presented with worsening shortness of breath.  On presentation, she was in significant respiratory distress and initially required BiPAP.  Workup showed WBCs of 22.2, negative COVID/influenza/RSV PCR with chest x-ray negative for focal consolidation/edema/effusion.  She was started on IV Solu-Medrol, antibiotics and given nebs.  During the hospitalization, her condition is improved.  Her respiratory status has remained stable and she feels okay to go home today.  She will be discharged home today on oral prednisone.  Discharge Diagnoses:   Acute on chronic hypoxic respiratory failure COPD exacerbation -Normally wears 3 to 4 L oxygen via nasal cannula.  Required BiPAP initially in the ED.  Currently on 2 L oxygen via nasal cannula. -Workup as above. -Currently on IV Solu-Medrol and nebs.  Her respiratory status has remained stable and she feels okay to go home today.  She will be discharged home today on oral prednisone 40 mg daily for 7 days.  Outpatient follow-up with PCP and pulmonary  Leukocytosis -reactive.  Worsening today.  Outpatient follow-up.  Anemia of chronic disease -From chronic illnesses.  Hemoglobin  stable.  Monitor intermittently as an outpatient   Thrombocytosis -Improved.   Diabetes mellitus type 2 with hyperglycemia -Carb modified diet.  Resume metformin.   Hypertension Hyperlipidemia -Continue metoprolol and statin.  Losartan was apparently discontinued by PCP.  Outpatient follow-up with PCP.  Hypothyroidism -Continue levothyroxine   GERD -Continue PPI   Depression -Continue venlafaxine XR     Discharge Instructions  Discharge Instructions     Diet - low sodium heart healthy   Complete by: As directed    Diet Carb Modified   Complete by: As directed    Increase activity slowly   Complete by: As directed       Allergies as of 12/21/2022       Reactions   Lisinopril Cough   cough   Sulfonamide Derivatives Rash        Medication List     STOP taking these medications    doxycycline 100 MG tablet Commonly known as: VIBRA-TABS   losartan 25 MG tablet Commonly known as: COZAAR   pantoprazole 40 MG tablet Commonly known as: PROTONIX       TAKE these medications    albuterol (2.5 MG/3ML) 0.083% nebulizer solution Commonly known as: PROVENTIL Take 2.5 mg by nebulization as needed for wheezing or shortness of breath.   albuterol 108 (90 Base) MCG/ACT inhaler Commonly known as: VENTOLIN HFA Inhale 2 puffs into the lungs every 4 (four) hours as needed for wheezing or shortness of breath.   atorvastatin 80 MG tablet Commonly known as: LIPITOR Take 1 tablet (80 mg total) by mouth daily.   Breztri Aerosphere 160-9-4.8 MCG/ACT Aero Generic drug: Budeson-Glycopyrrol-Formoterol Inhale 2 puffs into the lungs in the morning and at bedtime.   CALCIUM 600+D3 PO Take 1 tablet  by mouth daily.   CENTRUM PO Take 1 tablet by mouth daily.   esomeprazole 40 MG capsule Commonly known as: NEXIUM Take 1 capsule (40 mg total) by mouth daily at 12 noon. What changed: See the new instructions.   ferrous sulfate 325 (65 FE) MG tablet Take 650 mg by  mouth daily.   fluticasone 50 MCG/ACT nasal spray Commonly known as: FLONASE Place 1 spray into both nostrils in the morning and at bedtime.   furosemide 20 MG tablet Commonly known as: LASIX TAKE 1 TABLET BY MOUTH AS NEEDED   levocetirizine 5 MG tablet Commonly known as: XYZAL Take 5 mg by mouth daily.   levothyroxine 75 MCG tablet Commonly known as: SYNTHROID Take 1 tablet by mouth daily.   metFORMIN 500 MG 24 hr tablet Commonly known as: GLUCOPHAGE-XR Take 500 mg by mouth every evening.   metoprolol tartrate 50 MG tablet Commonly known as: LOPRESSOR Take 1 tablet (50 mg total) by mouth 2 (two) times daily.   nitroGLYCERIN 0.4 MG SL tablet Commonly known as: Nitrostat Place 1 tablet (0.4 mg total) under the tongue every 5 (five) minutes as needed for chest pain.   predniSONE 20 MG tablet Commonly known as: DELTASONE Take 2 tablets (40 mg total) by mouth daily with breakfast for 7 days. What changed:  medication strength how much to take how to take this when to take this additional instructions   traZODone 50 MG tablet Commonly known as: DESYREL Take 50 mg by mouth at bedtime.   venlafaxine XR 150 MG 24 hr capsule Commonly known as: EFFEXOR-XR Take 150 mg by mouth daily.   vitamin C 1000 MG tablet Take 1,000 mg by mouth daily.   VITAMIN D-3 PO Take 1 tablet by mouth daily.        Follow-up Information     Connect with your PCP/Specialist as discussed. Schedule an appointment as soon as possible for a visit .   Contact information: [email or web address] Call our physician referral line at [phone].        Shon Hale, MD. Schedule an appointment as soon as possible for a visit in 1 week(s).   Specialty: Family Medicine Contact information: [address] [place] [address] [phone]                Allergies  Allergen Reactions   Lisinopril Cough    cough   Sulfonamide Derivatives  Rash    Consultations: None   Procedures/Studies: DG Chest Portable 1 View Result Date: 12/19/2022 CLINICAL DATA:  Shortness of breath EXAM: PORTABLE CHEST 1 VIEW COMPARISON:  11/13/2022 FINDINGS: Heart and mediastinal contours are within normal limits. No focal opacities or effusions. No acute bony abnormality. Aortic atherosclerosis. IMPRESSION: No active disease. Electronically Signed   By: Charlett Nose M.D.   On: 12/19/2022 19:18      Subjective: Patient seen and examined at bedside.  Feels better enough to go home today.  No fever, vomiting, chest pain reported.  Discharge Exam: Vitals:   12/21/22 0356 12/21/22 0700  BP: 129/74   Pulse:    Resp:    Temp: 98.2 F (36.8 C) 98.4 F (36.9 C)  SpO2:      General: Pt is alert, awake, not in acute distress.  On 2 L oxygen by nasal cannula.  Looks chronically ill and deconditioned. Cardiovascular: rate controlled, S1/S2 + Respiratory: bilateral decreased breath sounds at bases with scattered crackles Abdominal: Soft, NT, ND, bowel sounds + Extremities: Trace lower extremity  edema, no cyanosis    The results of significant diagnostics from this hospitalization (including imaging, microbiology, ancillary and laboratory) are listed below for reference.     Microbiology: Recent Results (from the past 240 hours)  Resp panel by RT-PCR (RSV, Flu A&B, Covid) Anterior Nasal Swab     Status: None   Collection Time: 12/19/22  6:21 PM   Specimen: Anterior Nasal Swab  Result Value Ref Range Status   SARS Coronavirus 2 by RT PCR NEGATIVE NEGATIVE Final    Comment: (NOTE) SARS-CoV-2 target nucleic acids are NOT DETECTED.  The SARS-CoV-2 RNA is generally detectable in upper respiratory specimens during the acute phase of infection. The lowest concentration of SARS-CoV-2 viral copies this assay can detect is 138 copies/mL. A negative result does not preclude SARS-Cov-2 infection and should not be used as the sole basis for  treatment or other patient management decisions. A negative result may occur with  improper specimen collection/handling, submission of specimen other than nasopharyngeal swab, presence of viral mutation(s) within the areas targeted by this assay, and inadequate number of viral copies(<138 copies/mL). A negative result must be combined with clinical observations, patient history, and epidemiological information. The expected result is Negative.  Fact Sheet for Patients:  BloggerCourse.com  Fact Sheet for Healthcare Providers:  SeriousBroker.it  This test is no t yet approved or cleared by the [place] FDA and  has been authorized for detection and/or diagnosis of SARS-CoV-2 by FDA under an Emergency Use Authorization (EUA). This EUA will remain  in effect (meaning this test can be used) for the duration of the COVID-19 declaration under Section 564(b)(1) of the Act, 21 U.S.C.section 360bbb-3(b)(1), unless the authorization is terminated  or revoked sooner.       Influenza A by PCR NEGATIVE NEGATIVE Final   Influenza B by PCR NEGATIVE NEGATIVE Final    Comment: (NOTE) The Xpert Xpress SARS-CoV-2/FLU/RSV plus assay is intended as an aid in the diagnosis of influenza from Nasopharyngeal swab specimens and should not be used as a sole basis for treatment. Nasal washings and aspirates are unacceptable for Xpert Xpress SARS-CoV-2/FLU/RSV testing.  Fact Sheet for Patients: BloggerCourse.com  Fact Sheet for Healthcare Providers: SeriousBroker.it  This test is not yet approved or cleared by the [place] FDA and has been authorized for detection and/or diagnosis of SARS-CoV-2 by FDA under an Emergency Use Authorization (EUA). This EUA will remain in effect (meaning this test can be used) for the duration of the COVID-19 declaration under Section 564(b)(1) of the Act, 21  U.S.C. section 360bbb-3(b)(1), unless the authorization is terminated or revoked.     Resp Syncytial Virus by PCR NEGATIVE NEGATIVE Final    Comment: (NOTE) Fact Sheet for Patients: BloggerCourse.com  Fact Sheet for Healthcare Providers: SeriousBroker.it  This test is not yet approved or cleared by the [place] FDA and has been authorized for detection and/or diagnosis of SARS-CoV-2 by FDA under an Emergency Use Authorization (EUA). This EUA will remain in effect (meaning this test can be used) for the duration of the COVID-19 declaration under Section 564(b)(1) of the Act, 21 U.S.C. section 360bbb-3(b)(1), unless the authorization is terminated or revoked.  Performed at [facility], [address], [place], [place] [address]   Blood culture (routine x 2)     Status: None (Preliminary result)   Collection Time: 12/19/22  7:56 PM   Specimen: BLOOD RIGHT ARM  Result Value Ref Range Status   Specimen Description   Final  BLOOD RIGHT ARM Performed at [facility], [address], [place], [place] [address]    Special Requests   Final    BOTTLES DRAWN AEROBIC AND ANAEROBIC Blood Culture adequate volume Performed at [facility], [address] [place], [place] [address]    Culture   Final    NO GROWTH 2 DAYS Performed at [facility], [address] [place], [place] [address]    Report Status PENDING  Incomplete  Blood culture (routine x 2)     Status: None (Preliminary result)   Collection Time: 12/19/22  7:57 PM   Specimen: BLOOD LEFT HAND  Result Value Ref Range Status   Specimen Description   Final    BLOOD LEFT HAND Performed at [facility], [address], [place], [place] [address]    Special Requests   Final    BOTTLES DRAWN AEROBIC AND ANAEROBIC Blood Culture adequate volume Performed at [facility],  [address] [place], [place] [address]    Culture   Final    NO GROWTH 2 DAYS Performed at [facility], [address], [place], [place] [address]    Report Status PENDING  Incomplete     Labs: BNP (last 3 results) Recent Labs    12/19/22 1821  BNP 134.2*   Basic Metabolic Panel: Recent Labs  Lab 12/19/22 1821 12/20/22 0406 12/21/22 0417  NA 137 138 140  K 3.8 3.5 4.5  CL 92* 97* 101  CO2 29 30 30   GLUCOSE 279* 325* 201*  BUN 17 20 28*  CREATININE 1.10* 1.26* 1.04*  CALCIUM 8.9 8.4* 8.4*  MG  --   --  2.4   Liver Function Tests: No results for input(s): "AST", "ALT", "ALKPHOS", "BILITOT", "PROT", "ALBUMIN" in the last 168 hours. No results for input(s): "LIPASE", "AMYLASE" in the last 168 hours. No results for input(s): "AMMONIA" in the last 168 hours. CBC: Recent Labs  Lab 12/19/22 1821 12/20/22 0406 12/21/22 0417  WBC 22.2* 13.6* 20.5*  NEUTROABS 18.8*  --  19.3*  HGB 9.8* 8.4* 8.2*  HCT 31.1* 26.8* 27.2*  MCV 94.2 94.4 96.1  PLT 582* 353 454*   Cardiac Enzymes: No results for input(s): "CKTOTAL", "CKMB", "CKMBINDEX", "TROPONINI" in the last 168 hours. BNP: Invalid input(s): "POCBNP" CBG: Recent Labs  Lab 12/20/22 0729 12/20/22 1132 12/20/22 1627 12/20/22 2032 12/21/22 0730  GLUCAP 199* 188* 111* 196* 180*   D-Dimer No results for input(s): "DDIMER" in the last 72 hours. Hgb A1c Recent Labs    12/20/22 0406  HGBA1C 6.8*   Lipid Profile No results for input(s): "CHOL", "HDL", "LDLCALC", "TRIG", "CHOLHDL", "LDLDIRECT" in the last 72 hours. Thyroid function studies No results for input(s): "TSH", "T4TOTAL", "T3FREE", "THYROIDAB" in the last 72 hours.  Invalid input(s): "FREET3" Anemia work up No results for input(s): "VITAMINB12", "FOLATE", "FERRITIN", "TIBC", "IRON", "RETICCTPCT" in the last 72 hours. Urinalysis    Component Value Date/Time   COLORURINE YELLOW 01/01/2018 1340   APPEARANCEUR HAZY (A) 01/01/2018 1340    LABSPEC 1.019 01/01/2018 1340   PHURINE 5.0 01/01/2018 1340   GLUCOSEU NEGATIVE 01/01/2018 1340   HGBUR NEGATIVE 01/01/2018 1340   BILIRUBINUR NEGATIVE 01/01/2018 1340   KETONESUR NEGATIVE 01/01/2018 1340   PROTEINUR 30 (A) 01/01/2018 1340   NITRITE NEGATIVE 01/01/2018 1340   LEUKOCYTESUR MODERATE (A) 01/01/2018 1340   Sepsis Labs Recent Labs  Lab 12/19/22 1821 12/20/22 0406 12/21/22 0417  WBC 22.2* 13.6*  20.5*   Microbiology Recent Results (from the past 240 hours)  Resp panel by RT-PCR (RSV, Flu A&B, Covid) Anterior Nasal Swab     Status: None   Collection Time: 12/19/22  6:21 PM   Specimen: Anterior Nasal Swab  Result Value Ref Range Status   SARS Coronavirus 2 by RT PCR NEGATIVE NEGATIVE Final    Comment: (NOTE) SARS-CoV-2 target nucleic acids are NOT DETECTED.  The SARS-CoV-2 RNA is generally detectable in upper respiratory specimens during the acute phase of infection. The lowest concentration of SARS-CoV-2 viral copies this assay can detect is 138 copies/mL. A negative result does not preclude SARS-Cov-2 infection and should not be used as the sole basis for treatment or other patient management decisions. A negative result may occur with  improper specimen collection/handling, submission of specimen other than nasopharyngeal swab, presence of viral mutation(s) within the areas targeted by this assay, and inadequate number of viral copies(<138 copies/mL). A negative result must be combined with clinical observations, patient history, and epidemiological information. The expected result is Negative.  Fact Sheet for Patients:  BloggerCourse.com  Fact Sheet for Healthcare Providers:  SeriousBroker.it  This test is no t yet approved or cleared by the [place] FDA and  has been authorized for detection and/or diagnosis of SARS-CoV-2 by FDA under an Emergency Use Authorization (EUA). This EUA will remain  in  effect (meaning this test can be used) for the duration of the COVID-19 declaration under Section 564(b)(1) of the Act, 21 U.S.C.section 360bbb-3(b)(1), unless the authorization is terminated  or revoked sooner.       Influenza A by PCR NEGATIVE NEGATIVE Final   Influenza B by PCR NEGATIVE NEGATIVE Final    Comment: (NOTE) The Xpert Xpress SARS-CoV-2/FLU/RSV plus assay is intended as an aid in the diagnosis of influenza from Nasopharyngeal swab specimens and should not be used as a sole basis for treatment. Nasal washings and aspirates are unacceptable for Xpert Xpress SARS-CoV-2/FLU/RSV testing.  Fact Sheet for Patients: BloggerCourse.com  Fact Sheet for Healthcare Providers: SeriousBroker.it  This test is not yet approved or cleared by the [place] FDA and has been authorized for detection and/or diagnosis of SARS-CoV-2 by FDA under an Emergency Use Authorization (EUA). This EUA will remain in effect (meaning this test can be used) for the duration of the COVID-19 declaration under Section 564(b)(1) of the Act, 21 U.S.C. section 360bbb-3(b)(1), unless the authorization is terminated or revoked.     Resp Syncytial Virus by PCR NEGATIVE NEGATIVE Final    Comment: (NOTE) Fact Sheet for Patients: BloggerCourse.com  Fact Sheet for Healthcare Providers: SeriousBroker.it  This test is not yet approved or cleared by the [place] FDA and has been authorized for detection and/or diagnosis of SARS-CoV-2 by FDA under an Emergency Use Authorization (EUA). This EUA will remain in effect (meaning this test can be used) for the duration of the COVID-19 declaration under Section 564(b)(1) of the Act, 21 U.S.C. section 360bbb-3(b)(1), unless the authorization is terminated or revoked.  Performed at [facility], [address], [place], [place] [address]    Blood culture (routine x 2)     Status: None (Preliminary result)   Collection Time: 12/19/22  7:56 PM   Specimen: BLOOD RIGHT ARM  Result Value Ref Range Status   Specimen Description   Final    BLOOD RIGHT ARM Performed at [facility], [address], [place], [place] [address]    Special Requests   Final  BOTTLES DRAWN AEROBIC AND ANAEROBIC Blood Culture adequate volume Performed at [facility], [address] [place], [place] [address]    Culture   Final    NO GROWTH 2 DAYS Performed at [facility], [address] [place], [place] [address]    Report Status PENDING  Incomplete  Blood culture (routine x 2)     Status: None (Preliminary result)   Collection Time: 12/19/22  7:57 PM   Specimen: BLOOD LEFT HAND  Result Value Ref Range Status   Specimen Description   Final    BLOOD LEFT HAND Performed at [facility], [address] [place], [place] [address]    Special Requests   Final    BOTTLES DRAWN AEROBIC AND ANAEROBIC Blood Culture adequate volume Performed at [facility], [address], [place], [place] [address]    Culture   Final    NO GROWTH 2 DAYS Performed at [facility], [address], [place], [place] [address]    Report Status PENDING  Incomplete     Time coordinating discharge: 35 minutes  SIGNED:   Glade Lloyd, MD  [facility] 12/21/2022, 9:34 AM

## 2022-12-24 LAB — CULTURE, BLOOD (ROUTINE X 2)
Culture: NO GROWTH
Culture: NO GROWTH
Special Requests: ADEQUATE
Special Requests: ADEQUATE

## 2022-12-26 DIAGNOSIS — D649 Anemia, unspecified: Secondary | ICD-10-CM | POA: Diagnosis not present

## 2022-12-26 DIAGNOSIS — R0602 Shortness of breath: Secondary | ICD-10-CM | POA: Diagnosis not present

## 2022-12-26 DIAGNOSIS — J449 Chronic obstructive pulmonary disease, unspecified: Secondary | ICD-10-CM | POA: Diagnosis not present

## 2022-12-27 ENCOUNTER — Other Ambulatory Visit (HOSPITAL_COMMUNITY): Payer: Self-pay | Admitting: Family Medicine

## 2022-12-27 DIAGNOSIS — R0602 Shortness of breath: Secondary | ICD-10-CM

## 2023-01-02 ENCOUNTER — Encounter (HOSPITAL_COMMUNITY): Payer: Self-pay

## 2023-01-02 ENCOUNTER — Ambulatory Visit (HOSPITAL_COMMUNITY): Payer: PPO | Attending: Family Medicine

## 2023-01-05 DIAGNOSIS — I509 Heart failure, unspecified: Secondary | ICD-10-CM | POA: Diagnosis not present

## 2023-01-05 DIAGNOSIS — J449 Chronic obstructive pulmonary disease, unspecified: Secondary | ICD-10-CM | POA: Diagnosis not present

## 2023-01-25 ENCOUNTER — Other Ambulatory Visit (HOSPITAL_COMMUNITY): Payer: PPO

## 2023-01-30 ENCOUNTER — Other Ambulatory Visit: Payer: Self-pay | Admitting: Family Medicine

## 2023-01-30 DIAGNOSIS — Z1231 Encounter for screening mammogram for malignant neoplasm of breast: Secondary | ICD-10-CM

## 2023-01-31 ENCOUNTER — Ambulatory Visit
Admission: RE | Admit: 2023-01-31 | Discharge: 2023-01-31 | Discharge status: Home / Self Care | Payer: PPO | Source: Ambulatory Visit | Attending: Family Medicine

## 2023-01-31 DIAGNOSIS — Z1231 Encounter for screening mammogram for malignant neoplasm of breast: Secondary | ICD-10-CM

## 2023-02-05 ENCOUNTER — Ambulatory Visit (HOSPITAL_COMMUNITY): Payer: PPO | Attending: Cardiology

## 2023-02-05 DIAGNOSIS — R0602 Shortness of breath: Secondary | ICD-10-CM | POA: Diagnosis present

## 2023-02-05 LAB — ECHOCARDIOGRAM COMPLETE
Area-P 1/2: 5.38 cm2
S' Lateral: 1.4 cm

## 2023-02-08 ENCOUNTER — Telehealth: Payer: Self-pay | Admitting: Cardiovascular Disease

## 2023-02-08 NOTE — Telephone Encounter (Signed)
Tried to call office multiple times and kept getting put on hold before anyone even answered

## 2023-02-08 NOTE — Telephone Encounter (Signed)
Patient PCP would like for the dr to re check patient echo with in the next month. Please advise

## 2023-02-09 NOTE — Telephone Encounter (Addendum)
Reached out to [facility] who did echo. She will have patient come in for [facility]  however she can not guarantee that the pictures will be any better

## 2023-02-12 NOTE — Telephone Encounter (Signed)
Left detailed message with Kemper Durie

## 2023-02-13 ENCOUNTER — Other Ambulatory Visit (HOSPITAL_COMMUNITY): Payer: Self-pay | Admitting: Family

## 2023-02-13 DIAGNOSIS — R0602 Shortness of breath: Secondary | ICD-10-CM

## 2023-02-13 NOTE — Telephone Encounter (Signed)
 Discussed further with Shanda who reviewed Echo She remembered patient and she was difficult study secondary to her lung issues on 4L of O2 Patient can have Definity which would help visualize the ejection fraction however current echo shows it is normal  Unfortunately to redo the patient will get charged   Left message for Wileen to call back

## 2023-02-14 ENCOUNTER — Telehealth: Payer: Self-pay | Admitting: Cardiovascular Disease

## 2023-02-14 ENCOUNTER — Ambulatory Visit (HOSPITAL_COMMUNITY): Payer: PPO

## 2023-02-14 NOTE — Telephone Encounter (Signed)
 Dr. Donia Furlough said she wants pt to see us  for general cardiology not to re-do echo

## 2023-02-14 NOTE — Telephone Encounter (Signed)
 Spoke with Orlean and advised would call patient Per Orlean Dr Chrystal wants patient to follow up with cardiology since she is concerned about CHF.  Spoke with patient, did cancel Echo, and scheduled follow up.  Patient denies any swelling  Per hospital stay in December paitent was to follow up with pulmonary in 1 week.  Advised patient to call pulmonologist to get appointment as soon as she can Explained to patient if they offer something on the 10th take it and ok to cancel appointment with [name] and will get her in somewhere different.

## 2023-02-19 ENCOUNTER — Ambulatory Visit (HOSPITAL_BASED_OUTPATIENT_CLINIC_OR_DEPARTMENT_OTHER): Payer: PPO | Admitting: Family

## 2023-02-19 ENCOUNTER — Encounter: Payer: Self-pay | Admitting: Primary Care

## 2023-02-19 ENCOUNTER — Ambulatory Visit (INDEPENDENT_AMBULATORY_CARE_PROVIDER_SITE_OTHER): Payer: PPO | Admitting: Primary Care

## 2023-02-19 VITALS — BP 138/56 | HR 96 | Temp 96.8°F | Ht 60.0 in | Wt 126.6 lb

## 2023-02-19 DIAGNOSIS — J9611 Chronic respiratory failure with hypoxia: Secondary | ICD-10-CM

## 2023-02-19 DIAGNOSIS — J441 Chronic obstructive pulmonary disease with (acute) exacerbation: Secondary | ICD-10-CM

## 2023-02-19 MED ORDER — ROFLUMILAST 500 MCG PO TABS: 500.0000 ug | ORAL_TABLET | Freq: Every day | ORAL | 3 refills | Status: DC

## 2023-02-19 MED ORDER — ROFLUMILAST 250 MCG PO TABS: ORAL_TABLET | ORAL | 0 refills | Status: DC

## 2023-02-19 MED ORDER — PREDNISONE 10 MG PO TABS: ORAL_TABLET | ORAL | 0 refills | Status: DC

## 2023-02-19 NOTE — Patient Instructions (Addendum)
 -CHRONIC OBSTRUCTIVE PULMONARY DISEASE (COPD) WITH RECENT EXACERBATION: COPD is a chronic lung disease that makes it hard to breathe. You were recently hospitalized for a COPD exacerbation and treated with steroids, antibiotics, and nebulizers. Currently, you are experiencing wheezing and coughing up yellow sputum. We will start you on a prednisone  taper and consider daily low-dose prednisone  if your symptoms persist or if you cannot tolerate Daliresp . We will also order a spirometry test to assess your current lung function. Continue Breztri  twice daily.  -CONSIDERATION FOR DALIRESP  (ROFLUMILAST ): Due to frequent COPD exacerbations requiring prednisone  approximately every other month, we will start you on Daliresp  (Roflumilast ) to decrease inflammation and the rate of exacerbations. Please monitor for potential side effects, including mood changes and vivid dreams. See below.  It belongs to a group of medications called PDE4 (phosphodiesterase-4) inhibitors. This medication helps to reduce the risk of COPD flare-ups for people with severe COPD. Name  -GENERAL HEALTH MAINTENANCE: We recommend getting the RSV vaccine at the pharmacy. We will order a CBC to assess your white blood cell count. Continue using your Breztri  inhaler twice daily and oxygen  3-4 liters as needed. Follow up in 3 months with a spirometry test beforehand.  Orders: Spirometry 30 mins  Follow-up 3 months with Dr. Baldwin Levee / 30 min PFT prior    Roflumilast  Tablets What is this medication? ROFLUMILAST  (roe FLUE mi last) treats chronic obstructive pulmonary disease (COPD). It works by decreasing inflammation of the airways, making it easier to breathe. Do not use it to treat a sudden COPD flare-up. This medicine may be used for other purposes; ask your health care provider or pharmacist if you have questions. COMMON BRAND NAME(S): Daliresp  What should I tell my care team before I take this medication? They need to know if you  have any of these conditions: Liver disease Mental health conditions An unusual or allergic reaction to roflumilast , other medications, foods, dyes, or preservatives Pregnant or trying to get pregnant Breastfeeding How should I use this medication? Take this medication by mouth. Take it as directed on the prescription label at the same time every day. You can take it with or without food. If it upsets your stomach, take it with food. Keep taking it unless your care team tells you to stop. A special MedGuide will be given to you by the pharmacist with each prescription and refill. Be sure to read this information carefully each time. Talk to your care team about the use of this medication in children. Special care may be needed. Overdosage: If you think you have taken too much of this medicine contact a poison control center or emergency room at once. NOTE: This medicine is only for you. Do not share this medicine with others. What if I miss a dose? If you miss a dose, take it as soon as you can. If it is almost time for your next dose, take only that dose. Do not take double or extra doses. What may interact with this medication? Other medications may affect the way this medication works. Talk with your care team about all of the medications you take. They may suggest changes to your treatment plan to lower the risk of side effects and to make sure your medications work as intended. This list may not describe all possible interactions. Give your health care provider a list of all the medicines, herbs, non-prescription drugs, or dietary supplements you use. Also tell them if you smoke, drink alcohol, or use illegal drugs.  Some items may interact with your medicine. What should I watch for while using this medication? Visit your care team for regular checks on your progress. Tell your care team if your symptoms do not start to get better or if they get worse. What side effects may I notice from  receiving this medication? Side effects that you should report to your care team as soon as possible: Allergic reactions--skin rash, itching, hives, swelling of the face, lips, tongue, or throat Mood and behavior changes--anxiety, nervousness, confusion, hallucinations, irritability, hostility, thoughts of suicide or self-harm, worsening mood, feelings of depression Side effects that usually do not require medical attention (report to your care team if they continue or are bothersome): Back pain Diarrhea Dizziness Headache Loss of appetite with weight loss Nausea Trouble sleeping This list may not describe all possible side effects. Call your doctor for medical advice about side effects. You may report side effects to FDA at 1-800-FDA-1088. Where should I keep my medication? Keep out of the reach of children and pets. Store at room temperature between 20 and 25 degrees C (68 and 77 degrees F). Get rid of any unused medication after the expiration date. To get rid of medications that are no longer needed or have expired: Take the medication to a medication take-back program. Check with your pharmacy or law enforcement to find a location. If you cannot return the medication, check the label or package insert to see if the medication should be thrown out in the garbage or flushed down the toilet. If you are not sure, ask your care team. If it is safe to put it in the trash, empty the medication out of the container. Mix the medication with cat litter, dirt, coffee grounds, or other unwanted substance. Seal the mixture in a bag or container. Put it in the trash. NOTE: This sheet is a summary. It may not cover all possible information. If you have questions about this medicine, talk to your doctor, pharmacist, or health care provider.  2024 Elsevier/Gold Standard (2021-09-26 00:00:00)

## 2023-02-19 NOTE — Progress Notes (Signed)
 @Patient  ID: [name] [name], female    DOB: 12/16/1945, 78 y.o.   MRN: [id number]  Chief Complaint  Patient presents with   Follow-up    COPD    Referring provider: Ransom Byers, *  HPI: 78 year old female, former smoker quit 1996 (60-pack-year history).  Past medical history significant for hypertension, pulmonary embolism, coronary artery disease, COPD, vocal cord dysfunction, chronic respiratory failure acute on chronic respiratory failure, type 2 diabetes, dyslipidemia, iron deficiency, obesity.  Patient of Dr. Baldwin Levee.    02/19/2023 Discussed the use of AI scribe software for clinical note transcription with the patient, who gave verbal consent to proceed.  History of Present Illness   [name] [name] "[name]" is a 78 year old female with COPD and chronic respiratory failure who presents for follow-up after a recent hospitalization for a COPD exacerbation.  She was hospitalized from December 10th to December 12th, 2024, for a COPD exacerbation with respiratory distress, necessitating BiPAP support. Her white blood cell count was elevated, but tests for COVID, flu, and RSV were negative, and a chest x-ray showed no pneumonia. She was treated with steroids, antibiotics, and nebulizers, leading to improvement, and was discharged on prednisone .  Since discharge, she initially improved, engaging in activities like cleaning and grocery shopping. However, her breathing declined after two weeks, coinciding with the end of her prednisone  course. Her symptoms worsen with weather changes, requiring prednisone  approximately every other month.  She uses a Breztri  inhaler, two puffs in the morning and evening, and manages a chronic cough with Mucinex . She occasionally coughs up watery, yellow sputum. She uses 3 to 4 liters of oxygen , depending on activity level, and has a nebulizer available if needed.     Allergies  Allergen Reactions   Lisinopril Cough    cough   Sulfonamide  Derivatives Rash    Immunization History  Administered Date(s) Administered   Fluad Quad(high Dose 65+) 10/08/2020   Influenza Split 11/29/2010, 10/07/2016, 10/15/2017   Influenza Whole 12/09/2008, 10/09/2009, 10/02/2010, 09/10/2011   Influenza, High Dose Seasonal PF 10/07/2016, 10/15/2017, 08/22/2018   Influenza,inj,Quad PF,6+ Mos 10/01/2012, 10/08/2013, 09/30/2014, 10/11/2015   Influenza,inj,quad, With Preservative 10/08/2014   PFIZER(Purple Top)SARS-COV-2 Vaccination 02/13/2019, 03/06/2019   Pneumococcal Conjugate-13 10/09/2013, 11/20/2013   Pneumococcal Polysaccharide-23 06/21/2001, 09/14/2010   Pneumococcal-Unspecified 10/02/2010   Td 06/21/2001   Tdap 09/14/2010   Zoster Recombinant(Shingrix) 02/27/2018, 07/01/2018   Zoster, Live 05/01/2007    Past Medical History:  Diagnosis Date   Allergic rhinitis, cause unspecified    CAD in native artery 07/25/2017   Chronic airway obstruction, not elsewhere classified    COPD (chronic obstructive pulmonary disease) (HCC)    Depressive disorder, not elsewhere classified    Nephritis and nephropathy, not specified as acute or chronic, with unspecified pathological lesion in kidney    Obesity, unspecified    Other and unspecified hyperlipidemia    Oxygen  deficiency    Thyrotoxicosis without mention of goiter or other cause, without mention of thyrotoxic crisis or storm    Unspecified essential hypertension    UTI (urinary tract infection) 2019 dec    Tobacco History: Social History   Tobacco Use  Smoking Status Former   Current packs/day: 0.00   Average packs/day: 2.0 packs/day for 34.0 years (68.0 ttl pk-yrs)   Types: Cigarettes   Start date: 01/10/1960   Quit date: 01/09/1994   Years since quitting: 29.1  Smokeless Tobacco Never   Counseling given: Not Answered   Outpatient Medications Prior to Visit  Medication  Sig Dispense Refill   albuterol  (PROVENTIL ) (2.5 MG/3ML) 0.083% nebulizer solution Take 2.5 mg by nebulization  as needed for wheezing or shortness of breath.     albuterol  (VENTOLIN  HFA) 108 (90 Base) MCG/ACT inhaler Inhale 2 puffs into the lungs every 4 (four) hours as needed for wheezing or shortness of breath. 6.7 g 3   Ascorbic Acid (VITAMIN C) 1000 MG tablet Take 1,000 mg by mouth daily.     atorvastatin  (LIPITOR) 80 MG tablet Take 1 tablet (80 mg total) by mouth daily. 90 tablet 3   Budeson-Glycopyrrol-Formoterol  (BREZTRI  AEROSPHERE) 160-9-4.8 MCG/ACT AERO Inhale 2 puffs into the lungs in the morning and at bedtime.     Calcium  Carb-Cholecalciferol (CALCIUM  600+D3 PO) Take 1 tablet by mouth daily.     Cholecalciferol (VITAMIN D-3 PO) Take 1 tablet by mouth daily.     esomeprazole  (NEXIUM ) 40 MG capsule Take 1 capsule (40 mg total) by mouth daily at 12 noon.     ferrous sulfate  325 (65 FE) MG tablet Take 650 mg by mouth daily.     fluticasone  (FLONASE ) 50 MCG/ACT nasal spray Place 1 spray into both nostrils in the morning and at bedtime.     furosemide  (LASIX ) 20 MG tablet TAKE 1 TABLET BY MOUTH AS NEEDED 90 tablet 3   levocetirizine (XYZAL) 5 MG tablet Take 5 mg by mouth daily.     levothyroxine  (SYNTHROID ) 75 MCG tablet Take 1 tablet by mouth daily.     metFORMIN  (GLUCOPHAGE -XR) 500 MG 24 hr tablet Take 500 mg by mouth every evening.     metoprolol  tartrate (LOPRESSOR ) 50 MG tablet Take 1 tablet (50 mg total) by mouth 2 (two) times daily. 180 tablet 3   Multiple Vitamins-Minerals (CENTRUM PO) Take 1 tablet by mouth daily.     traZODone  (DESYREL ) 50 MG tablet Take 50 mg by mouth at bedtime.      venlafaxine  XR (EFFEXOR -XR) 150 MG 24 hr capsule Take 150 mg by mouth daily.      nitroGLYCERIN  (NITROSTAT ) 0.4 MG SL tablet Place 1 tablet (0.4 mg total) under the tongue every 5 (five) minutes as needed for chest pain. 25 tablet 4   No facility-administered medications prior to visit.   Review of Systems  Review of Systems  Constitutional: Negative.   HENT:  Positive for congestion.   Respiratory:   Positive for cough and wheezing.   Cardiovascular: Negative.  Negative for leg swelling.   Physical Exam  BP (!) 138/56 (BP Location: Right Arm, Patient Position: Sitting, Cuff Size: Normal)   Pulse 96   Temp (!) 96.8 F (36 C) (Temporal)   Ht 5' (1.524 m)   Wt 126 lb 9.6 oz (57.4 kg)   SpO2 94%   BMI 24.72 kg/m  Physical Exam Constitutional:      Appearance: Normal appearance.  HENT:     Head: Normocephalic and atraumatic.  Cardiovascular:     Rate and Rhythm: Normal rate and regular rhythm.  Pulmonary:     Effort: Pulmonary effort is normal.     Breath sounds: Wheezing present. No rales.     Comments: 3L poc Skin:    General: Skin is warm and dry.  Neurological:     General: No focal deficit present.     Mental Status: She is alert and oriented to person, place, and time. Mental status is at baseline.  Psychiatric:        Mood and Affect: Mood normal.  Behavior: Behavior normal.        Thought Content: Thought content normal.        Judgment: Judgment normal.      Lab Results:  CBC    Component Value Date/Time   WBC 20.5 (H) 12/21/2022 0417   RBC 2.83 (L) 12/21/2022 0417   HGB 8.2 (L) 12/21/2022 0417   HGB 12.7 07/02/2019 1458   HGB 12.5 02/21/2017 1428   HCT 27.2 (L) 12/21/2022 0417   HCT 38.6 02/21/2017 1428   PLT 454 (H) 12/21/2022 0417   PLT 276 07/02/2019 1458   PLT 252 02/21/2017 1428   MCV 96.1 12/21/2022 0417   MCV 91 02/21/2017 1428   MCH 29.0 12/21/2022 0417   MCHC 30.1 12/21/2022 0417   RDW 14.0 12/21/2022 0417   RDW 13.7 02/21/2017 1428   LYMPHSABS 0.4 (L) 12/21/2022 0417   LYMPHSABS 1.6 02/21/2017 1428   MONOABS 0.6 12/21/2022 0417   EOSABS 0.0 12/21/2022 0417   EOSABS 0.4 02/21/2017 1428   BASOSABS 0.0 12/21/2022 0417   BASOSABS 0.0 02/21/2017 1428    BMET    Component Value Date/Time   NA 140 12/21/2022 0417   NA 146 (H) 07/28/2021 0955   K 4.5 12/21/2022 0417   CL 101 12/21/2022 0417   CO2 30 12/21/2022 0417   GLUCOSE  201 (H) 12/21/2022 0417   BUN 28 (H) 12/21/2022 0417   BUN 15 07/28/2021 0955   CREATININE 1.04 (H) 12/21/2022 0417   CALCIUM  8.4 (L) 12/21/2022 0417   GFRNONAA 55 (L) 12/21/2022 0417   GFRAA 48 (L) 01/01/2018 1145    BNP    Component Value Date/Time   BNP 134.2 (H) 12/19/2022 1821    ProBNP    Component Value Date/Time   PROBNP 47.0 04/25/2022 1000    Imaging: ECHOCARDIOGRAM COMPLETE Result Date: 02/05/2023    ECHOCARDIOGRAM REPORT   Patient Name:   JOSEFITA ZUCHOWSKI Reever Date of Exam: 02/05/2023 Medical Rec #:  [id number]          Height:       60.0 in Accession #:    [id number]         Weight:       129.6 lb Date of Birth:  [birth year]          BSA:          1.552 m Patient Age:    77 years           BP:           138/78 mmHg Patient Gender: F                  HR:           109 bpm. Exam Location:  [address] Procedure: 2D Echo, 3D Echo, Cardiac Doppler, Color Doppler and Strain Analysis Indications:    R06.02 Shortness of breath  History:        Patient has prior history of Echocardiogram examinations, most                 recent 07/27/2017. CAD, COPD; Risk Factors:Hypertension.  Sonographer:    Donnita Gales [facility], RDCS Referring Phys: [id number] KATHRYN S [place] IMPRESSIONS  1. Technically difficult study with limited visualization.  2. Left ventricular ejection fraction, by estimation, is 60 to 65%. The left ventricle has normal function. The left ventricle has no regional wall motion abnormalities. There is moderate concentric left ventricular hypertrophy. Left ventricular diastolic parameters were normal. GLS -19.1%  3. Right ventricular systolic function is normal. The right ventricular size is normal.  4. The mitral valve is normal in structure. No evidence of mitral valve regurgitation. No evidence of mitral stenosis.  5. The aortic valve was not well visualized. Unable to determine aortic valve morphology due to image quality. Aortic valve regurgitation is not visualized. No aortic  stenosis is present.  6. The inferior vena cava is normal in size with greater than 50% respiratory variability, suggesting right atrial pressure of 3 mmHg. FINDINGS  Left Ventricle: Left ventricular ejection fraction, by estimation, is 60 to 65%. The left ventricle has normal function. The left ventricle has no regional wall motion abnormalities. The left ventricular internal cavity size was normal in size. There is  moderate concentric left ventricular hypertrophy. Left ventricular diastolic parameters were normal. Right Ventricle: The right ventricular size is normal. No increase in right ventricular wall thickness. Right ventricular systolic function is normal. Left Atrium: Left atrial size was normal in size. Right Atrium: Right atrial size was normal in size. Pericardium: There is no evidence of pericardial effusion. Mitral Valve: The mitral valve is normal in structure. Mild mitral annular calcification. No evidence of mitral valve regurgitation. No evidence of mitral valve stenosis. Tricuspid Valve: The tricuspid valve is normal in structure. Tricuspid valve regurgitation is not demonstrated. No evidence of tricuspid stenosis. Aortic Valve: The aortic valve was not well visualized. There is mild aortic valve annular calcification. Aortic valve regurgitation is not visualized. No aortic stenosis is present. Pulmonic Valve: The pulmonic valve was normal in structure. Pulmonic valve regurgitation is not visualized. No evidence of pulmonic stenosis. Aorta: The aortic root is normal in size and structure. Venous: The inferior vena cava is normal in size with greater than 50% respiratory variability, suggesting right atrial pressure of 3 mmHg. IAS/Shunts: No atrial level shunt detected by color flow Doppler.  LEFT VENTRICLE PLAX 2D LVIDd:         1.90 cm   Diastology LVIDs:         1.40 cm   LV e' medial:    7.72 cm/s LV PW:         1.10 cm   LV E/e' medial:  8.7 LV IVS:        1.40 cm   LV e' lateral:   7.07 cm/s  LVOT diam:     1.90 cm   LV E/e' lateral: 9.5 LV SV:         41 LV SV Index:   27        2D Longitudinal Strain LVOT Area:     2.84 cm  2D Strain GLS (A2C):   -18.7 %                          2D Strain GLS (A3C):   -19.2 %                          2D Strain GLS (A4C):   -19.4 %                          2D Strain GLS Avg:     -19.1 %                           3D Volume EF:  3D EF:        66 %                          LV EDV:       69 ml                          LV ESV:       23 ml                          LV SV:        46 ml RIGHT VENTRICLE             IVC RV Basal diam:  2.40 cm     IVC diam: 1.20 cm RV Mid diam:    2.10 cm RV S prime:     11.40 cm/s TAPSE (M-mode): 1.7 cm LEFT ATRIUM             Index        RIGHT ATRIUM          Index LA diam:        2.30 cm 1.48 cm/m   RA Area:     8.09 cm LA Vol (A2C):   17.9 ml 11.53 ml/m  RA Volume:   15.60 ml 10.05 ml/m LA Vol (A4C):   16.4 ml 10.56 ml/m LA Biplane Vol: 17.5 ml 11.27 ml/m  AORTIC VALVE LVOT Vmax:   99.00 cm/s LVOT Vmean:  62.900 cm/s LVOT VTI:    0.146 m  AORTA Ao Root diam: 3.50 cm Ao Asc diam:  2.90 cm MITRAL VALVE MV Area (PHT): 5.38 cm     SHUNTS MV Decel Time: 141 msec     Systemic VTI:  0.15 m MV E velocity: 67.10 cm/s   Systemic Diam: 1.90 cm MV A velocity: 116.00 cm/s MV E/A ratio:  0.58 Aditya Sabharwal Electronically signed by Alwin Baars Signature Date/Time: 02/05/2023/10:11:49 AM    Final    MM 3D SCREENING MAMMOGRAM BILATERAL BREAST Result Date: 02/01/2023 CLINICAL DATA:  Screening. Technologist indicates best positioning/images possible. EXAM: DIGITAL SCREENING BILATERAL MAMMOGRAM WITH TOMOSYNTHESIS AND CAD TECHNIQUE: Bilateral screening digital craniocaudal and mediolateral oblique mammograms were obtained. Bilateral screening digital breast tomosynthesis was performed. The images were evaluated with computer-aided detection. COMPARISON:  Previous exam(s). ACR Breast Density Category b: There are scattered  areas of fibroglandular density. FINDINGS: There are no findings suspicious for malignancy. IMPRESSION: No mammographic evidence of malignancy. A result letter of this screening mammogram will be mailed directly to the patient. RECOMMENDATION: Screening mammogram in one year. (Code:SM-B-01Y) BI-RADS CATEGORY  1: Negative. Electronically Signed   By: Mercie Stalker M.D.   On: 02/01/2023 16:39     Assessment & Plan:   1. Chronic obstructive pulmonary disease with acute exacerbation (HCC) (Primary) - CBC w/Diff; Future - CBC w/Diff  2. Chronic respiratory failure with hypoxia (HCC)     Chronic Obstructive Pulmonary Disease (COPD) with recent exacerbation Hospitalized in December 2024 for COPD exacerbation with acute respiratory failure. Improved with steroids, antibiotics, and nebulizers. Currently experiencing wheezing and coughing up yellow sputum. -Continue Breztri  inhaler twice daily and oxygen  3-4 liters as needed. -Start prednisone  taper 40mg  x 2 days, 30mg  x 2 days, 20mg  x 2 days, 10mg  x 2 days  -Consider daily low-dose prednisone  if symptoms persist or if unable to tolerate Daliresp . -Order spirometry to assess current  lung function.  Consideration for Daliresp  (Roflumilast ) Frequent COPD exacerbations requiring prednisone  approximately every other month. -Start Daliresp  (Roflumilast ) 250mcg daily x 4 weeks then increase 500mcg daily  -Monitor for potential side effects including LFTs, weight changes, mood changes and vivid dreams.  General Health Maintenance -Recommend RSV vaccine at pharmacy. -Order CBC to assess white blood cell count. -Follow-up in 3 months with spirometry test beforehand.      Antonio Baumgarten, NP 02/19/2023

## 2023-02-19 NOTE — Telephone Encounter (Signed)
 [name] Hills, LPN at 01/14/1094  9:51 AM  Status: Signed  Spoke with [place] and advised would call patient Per [place] Dr Donia Furlough wants patient to follow up with cardiology since she is concerned about CHF.  Spoke with patient, did cancel Echo, and scheduled follow up.  Patient denies any swelling  Per hospital stay in December paitent was to follow up with pulmonary in 1 week.  Advised patient to call pulmonologist to get appointment as soon as she can Explained to patient if they offer something on the 10th take it and ok to cancel appointment with [name] and will get her in somewhere different.

## 2023-02-19 NOTE — Telephone Encounter (Signed)
 Routing to your box

## 2023-02-20 ENCOUNTER — Telehealth: Payer: Self-pay

## 2023-02-20 ENCOUNTER — Other Ambulatory Visit (HOSPITAL_COMMUNITY): Payer: Self-pay

## 2023-02-20 LAB — CBC WITH DIFFERENTIAL/PLATELET
Basophils Absolute: 0.1 10*3/uL (ref 0.0–0.1)
Basophils Relative: 0.8 % (ref 0.0–3.0)
Eosinophils Absolute: 0.6 10*3/uL (ref 0.0–0.7)
Eosinophils Relative: 5.7 % — ABNORMAL HIGH (ref 0.0–5.0)
HCT: 32.7 % — ABNORMAL LOW (ref 36.0–46.0)
Hemoglobin: 10.8 g/dL — ABNORMAL LOW (ref 12.0–15.0)
Lymphocytes Relative: 16.6 % (ref 12.0–46.0)
Lymphs Abs: 1.7 10*3/uL (ref 0.7–4.0)
MCHC: 33.1 g/dL (ref 30.0–36.0)
MCV: 89 fL (ref 78.0–100.0)
Monocytes Absolute: 0.8 10*3/uL (ref 0.1–1.0)
Monocytes Relative: 7.9 % (ref 3.0–12.0)
Neutro Abs: 7 10*3/uL (ref 1.4–7.7)
Neutrophils Relative %: 69 % (ref 43.0–77.0)
Platelets: 321 10*3/uL (ref 150.0–400.0)
RBC: 3.68 Mil/uL — ABNORMAL LOW (ref 3.87–5.11)
RDW: 15.3 % (ref 11.5–15.5)
WBC: 10.1 10*3/uL (ref 4.0–10.5)

## 2023-02-20 NOTE — Telephone Encounter (Signed)
*  Pulm  Pharmacy Patient Advocate Encounter  Received notification from [facility] [employer] that Prior Authorization for Roflumilast tablets  has been APPROVED from 02/20/2023 to 02/20/2024. Ran test claim, Copay is $100.00. This test claim was processed through [facility]- copay amounts may vary at other pharmacies due to pharmacy/plan contracts, or as the patient moves through the different stages of their insurance plan.   PA #/Case ID/Reference #: [id number]

## 2023-02-20 NOTE — Telephone Encounter (Signed)
*  Pulm  Pharmacy Patient Advocate Encounter  Received notification from [facility] ADVANTAGE/RX ADVANCE that Prior Authorization for Roflumilast tablets  has been APPROVED from 02/20/2023 to 02/20/2024. Ran test claim, Copay is $60.71. This test claim was processed through [facility]- copay amounts may vary at other pharmacies due to pharmacy/plan contracts, or as the patient moves through the different stages of their insurance plan.   PA #/Case ID/Reference #: [id number]

## 2023-02-21 NOTE — Progress Notes (Signed)
Please let patient know white blood cell count is back to normal.  Hemoglobin has also improved

## 2023-02-23 ENCOUNTER — Telehealth: Payer: Self-pay | Admitting: Primary Care

## 2023-02-23 NOTE — Telephone Encounter (Signed)
Patient is returning phone call. Patient phone number is [phone]. May leave detailed message on voicemail.

## 2023-02-27 NOTE — Telephone Encounter (Signed)
 Patient is returning phone call. Patient phone number is [phone].

## 2023-03-05 ENCOUNTER — Telehealth: Payer: Self-pay

## 2023-03-05 ENCOUNTER — Other Ambulatory Visit (HOSPITAL_BASED_OUTPATIENT_CLINIC_OR_DEPARTMENT_OTHER): Payer: Self-pay

## 2023-03-05 MED ORDER — PREDNISONE 10 MG PO TABS
ORAL_TABLET | ORAL | 0 refills | Status: AC
  Filled 2023-03-05 – 2023-03-06 (×2): qty 20, 8d supply, fill #0

## 2023-03-05 MED ORDER — ROFLUMILAST 250 MCG PO TABS
250.0000 ug | ORAL_TABLET | Freq: Every day | ORAL | 0 refills | Status: DC
  Filled 2023-03-05: qty 28, 28d supply, fill #0

## 2023-03-05 MED ORDER — ROFLUMILAST 500 MCG PO TABS
500.0000 ug | ORAL_TABLET | Freq: Every day | ORAL | 11 refills | Status: AC
  Filled 2023-03-05 – 2023-03-23 (×3): qty 30, 30d supply, fill #0
  Filled 2023-04-16: qty 30, 30d supply, fill #1
  Filled 2023-05-30: qty 30, 30d supply, fill #2
  Filled 2023-07-07: qty 30, 30d supply, fill #3
  Filled 2023-08-08: qty 30, 30d supply, fill #4
  Filled 2023-09-17: qty 30, 30d supply, fill #5
  Filled 2023-10-23: qty 30, 30d supply, fill #6
  Filled 2023-11-27: qty 30, 30d supply, fill #7
  Filled 2024-01-09: qty 30, 30d supply, fill #8

## 2023-03-05 NOTE — Telephone Encounter (Signed)
 Called patient.  Gave lab information.  See telephone contact from 03/05/2023.

## 2023-03-05 NOTE — Telephone Encounter (Addendum)
 Called patient to review lab results.  Patient states she is still SOB, having a cough and when she is able to cough the mucus up, it is green-yellow.  Sx have been persistent since last OV on 02/19/2023.  Only a slight improvement.  Patient would like more prednisone called into pharmacy.  Patient is using all her meds as prescribed:  Breztri - 2 puffs BID, Mucinex, albuterol MDI and neb tx prn.  Also, patient does not think she started the new rx Beth was to start her on at her last OV, Daliresp.  Patient states the pharmacy did not have one medication available when she picked up her other medicines .  [facility] [address]  Daliresp needs prior approval.  Per [employer], approved with copay at $60.71.  Verbal order from Beth:  OK to send in prednisone dose pack and Roflumilast 500 mg.  Called patient.  Patient is okay with paying copay of $60.71 and having rx sent into [facility] at [facility] on [address].  Will send in rx for Roflumilast 250 mg x 28 days, ($100 one time copay) then Roflumilast 500 mg daily thereafter (at $60.71 copay monthly).  Also sent in prednisone 10 mg dose pack per Beth's verbal order.  Beth informed of all information.  Patient called and given all information.  Called pharmacy and spoke with pharmacist at [facility] on [address]

## 2023-03-06 ENCOUNTER — Other Ambulatory Visit: Payer: Self-pay

## 2023-03-06 ENCOUNTER — Other Ambulatory Visit (HOSPITAL_BASED_OUTPATIENT_CLINIC_OR_DEPARTMENT_OTHER): Payer: Self-pay

## 2023-03-21 ENCOUNTER — Other Ambulatory Visit (HOSPITAL_BASED_OUTPATIENT_CLINIC_OR_DEPARTMENT_OTHER): Payer: Self-pay

## 2023-03-21 ENCOUNTER — Other Ambulatory Visit (HOSPITAL_COMMUNITY): Payer: Self-pay

## 2023-03-21 ENCOUNTER — Other Ambulatory Visit: Payer: Self-pay

## 2023-03-21 DIAGNOSIS — J449 Chronic obstructive pulmonary disease, unspecified: Secondary | ICD-10-CM | POA: Diagnosis not present

## 2023-03-21 DIAGNOSIS — J9611 Chronic respiratory failure with hypoxia: Secondary | ICD-10-CM | POA: Diagnosis not present

## 2023-03-22 ENCOUNTER — Other Ambulatory Visit (HOSPITAL_BASED_OUTPATIENT_CLINIC_OR_DEPARTMENT_OTHER): Payer: Self-pay

## 2023-03-22 ENCOUNTER — Other Ambulatory Visit (HOSPITAL_COMMUNITY): Payer: Self-pay

## 2023-03-23 ENCOUNTER — Other Ambulatory Visit (HOSPITAL_COMMUNITY): Payer: Self-pay

## 2023-03-23 ENCOUNTER — Other Ambulatory Visit: Payer: Self-pay

## 2023-03-23 ENCOUNTER — Telehealth: Payer: Self-pay

## 2023-03-23 ENCOUNTER — Other Ambulatory Visit (HOSPITAL_BASED_OUTPATIENT_CLINIC_OR_DEPARTMENT_OTHER): Payer: Self-pay

## 2023-03-23 NOTE — Telephone Encounter (Signed)
*  Pulm  Pharmacy Patient Advocate Encounter   Received notification from CoverMyMeds that prior authorization for Roflumilast tablets  is required/requested.   Insurance verification completed.   The patient is insured through [facility] ADVANTAGE/RX ADVANCE .   Per test claim: Refill too soon. PA is not needed at this time. Medication was filled 03/23/2023. Next eligible fill date is 04/15/2023.

## 2023-03-28 ENCOUNTER — Other Ambulatory Visit (HOSPITAL_COMMUNITY): Payer: Self-pay

## 2023-04-03 DIAGNOSIS — F320 Major depressive disorder, single episode, mild: Secondary | ICD-10-CM | POA: Diagnosis not present

## 2023-04-03 DIAGNOSIS — N183 Chronic kidney disease, stage 3 unspecified: Secondary | ICD-10-CM | POA: Diagnosis not present

## 2023-04-03 DIAGNOSIS — E1121 Type 2 diabetes mellitus with diabetic nephropathy: Secondary | ICD-10-CM | POA: Diagnosis not present

## 2023-04-03 DIAGNOSIS — D649 Anemia, unspecified: Secondary | ICD-10-CM | POA: Diagnosis not present

## 2023-04-03 DIAGNOSIS — E785 Hyperlipidemia, unspecified: Secondary | ICD-10-CM | POA: Diagnosis not present

## 2023-04-03 DIAGNOSIS — I10 Essential (primary) hypertension: Secondary | ICD-10-CM | POA: Diagnosis not present

## 2023-04-03 DIAGNOSIS — Z0000 Encounter for general adult medical examination without abnormal findings: Secondary | ICD-10-CM | POA: Diagnosis not present

## 2023-04-03 DIAGNOSIS — Z9981 Dependence on supplemental oxygen: Secondary | ICD-10-CM | POA: Diagnosis not present

## 2023-04-03 DIAGNOSIS — I2510 Atherosclerotic heart disease of native coronary artery without angina pectoris: Secondary | ICD-10-CM | POA: Diagnosis not present

## 2023-04-03 DIAGNOSIS — E039 Hypothyroidism, unspecified: Secondary | ICD-10-CM | POA: Diagnosis not present

## 2023-04-03 DIAGNOSIS — Z23 Encounter for immunization: Secondary | ICD-10-CM | POA: Diagnosis not present

## 2023-04-03 DIAGNOSIS — J449 Chronic obstructive pulmonary disease, unspecified: Secondary | ICD-10-CM | POA: Diagnosis not present

## 2023-04-09 ENCOUNTER — Other Ambulatory Visit: Payer: Self-pay | Admitting: Emergency Medicine

## 2023-04-09 ENCOUNTER — Telehealth: Payer: Self-pay

## 2023-04-09 ENCOUNTER — Ambulatory Visit: Payer: Self-pay | Admitting: Family Medicine

## 2023-04-09 ENCOUNTER — Telehealth: Payer: Self-pay | Admitting: Emergency Medicine

## 2023-04-09 MED ORDER — PREDNISONE 10 MG PO TABS: ORAL_TABLET | ORAL | 0 refills | Status: AC

## 2023-04-09 MED ORDER — PREDNISONE 10 MG PO TABS: ORAL_TABLET | ORAL | 0 refills | Status: DC

## 2023-04-09 NOTE — Telephone Encounter (Signed)
 Pred has been sent to pharmacy.

## 2023-04-09 NOTE — Telephone Encounter (Signed)
 Called and spoke with [name] [name] regarding RB note. Pt is going to test for covid & flu. Pt verbalized understanding and had no concerns.

## 2023-04-09 NOTE — Telephone Encounter (Signed)
Routing to Dr. Byrum. 

## 2023-04-09 NOTE — Telephone Encounter (Signed)
 Reason for Disposition  [1] MILD difficulty breathing (e.g., minimal/no SOB at rest, SOB with walking, pulse <100) AND [2] NEW-onset or WORSE than normal  Answer Assessment - Initial Assessment Questions E2C2 Pulmonary Triage - Initial Assessment Questions "Chief Complaint (e.g., cough, sob, wheezing, fever, chills, sweat or additional symptoms) *Go to specific symptom protocol after initial questions. SOB, yellow productive cough  "How long have symptoms been present?" Saturday  Have you tested for COVID or Flu? Note: If not, ask patient if a home test can be taken. If so, instruct patient to call back for positive results. No, denies URI sx  MEDICINES:   "Have you used any OTC meds to help with symptoms?" Yes If yes, ask "What medications?" Mucinex - provides relief  "Have you used your inhalers/maintenance medication?" Yes If yes, "What medications?" Albuterol neb - using 4-5x a day - reports minimal relief Breztri - 2 puffs twice a day  If inhaler, ask "How many puffs and how often?" Note: Review instructions on medication in the chart. See above  OXYGEN: "Do you wear supplemental oxygen?" Yes If yes, "How many liters are you supposed to use?" 3L ATC - reports 5L for recovery on exertion  "Do you monitor your oxygen levels?" Yes If yes, "What is your reading (oxygen level) today?" Mid-90s  "What is your usual oxygen saturation reading?"  (Note: Pulmonary O2 sats should be 90% or greater) Mid-90s   4. SEVERITY: "How bad is your breathing?" (e.g., mild, moderate, severe)    - MILD: No SOB at rest, mild SOB with walking, speaks normally in sentences, can lie down, no retractions, pulse < 100.    - MODERATE: SOB at rest, SOB with minimal exertion and prefers to sit, cannot lie down flat, speaks in phrases, mild retractions, audible wheezing, pulse 100-120.    - SEVERE: Very SOB at rest, speaks in single words, struggling to breathe, sitting hunched forward, retractions,  pulse > 120      Mild-moderate SOB above baseline 5. RECURRENT SYMPTOM: "Have you had difficulty breathing before?" If Yes, ask: "When was the last time?" and "What happened that time?"      prednisone 6. CARDIAC HISTORY: "Do you have any history of heart disease?" (e.g., heart attack, angina, bypass surgery, angioplasty)      denies 7. LUNG HISTORY: "Do you have any history of lung disease?"  (e.g., pulmonary embolus, asthma, emphysema)     COPD, PE 8. CAUSE: "What do you think is causing the breathing problem?"      exacerbation 9. OTHER SYMPTOMS: "Do you have any other symptoms? (e.g., dizziness, runny nose, cough, chest pain, fever)     Cough, runny nose Denies fever, CP 12. TRAVEL: "Have you traveled out of the country in the last month?" (e.g., travel history, exposures)       no  Protocols used: Breathing Difficulty-A-AH

## 2023-04-09 NOTE — Telephone Encounter (Signed)
 Recommend that she consider checking covid / flu, although she may be outside window to treat  Ok  to send pred > Take 40mg  daily for 3 days, then 30mg  daily for 3 days, then 20mg  daily for 3 days, then 10mg  daily for 3 days, then stop

## 2023-04-09 NOTE — Telephone Encounter (Signed)
 Pred sent to pharmacy. NFN

## 2023-04-09 NOTE — Telephone Encounter (Signed)
 Received incoming phone call. Pt tested negative for covid. Pt has to get neighbor to pick up pred taper. Nothing further needed.

## 2023-04-09 NOTE — Telephone Encounter (Signed)
 Copied from CRM [id number]. Topic: Clinical - Medication Refill >> Apr 09, 2023 12:16 PM Whitney O wrote: Most Recent Primary Care Visit:   Medication:  prednisone  Has the patient contacted their pharmacy? No because doctor has to do it  (Agent: If no, request that the patient contact the pharmacy for the refill. If patient does not wish to contact the pharmacy document the reason why and proceed with request.) (Agent: If yes, when and what did the pharmacy advise?)  Is this the correct pharmacy for this prescription? Yes If no, delete pharmacy and type the correct one.  This is the patient's preferred pharmacy:  [id number] [place], [place] - [address] [place] [address] Phone: [phone] Fax: [phone]  [facility]) - [place], [place] - [address] [facility] [address] [place] [address] [place] [address] Phone: [phone] Fax: [phone]  [facility] - [place], [place] - [address] [place] [address] Phone: [phone] Fax: [phone]  [facility] - [place], [place] - [address] [place] [address] Phone: [phone] Fax: [phone]  [facility] [address] [place] [address] Phone: [phone] Fax: [phone]  [facility] [address] [place] [address] Phone: [phone] Fax: [phone]   Has the prescription been filled recently? No  Is the patient out of the medication? Yes  Has the patient been seen for an appointment in the last year OR does the patient have an upcoming appointment? Yes  Can we respond through MyChart? No  Agent: Please be advised that Rx refills may take up to 3 business days. We ask that you follow-up with your pharmacy.

## 2023-04-09 NOTE — Telephone Encounter (Signed)
 Chief Complaint: SOB since Saturday  MEDICINES:   Mucinex; helps some "Using breathing machine quite a bit"  OXYGEN: 3 L Currently 97%; normal reading in 90s  Symptoms: Dry cough, yellow phlegm, chills Pertinent Negatives: Patient denies fever, headache  Disposition: [x] Refused Recommended Disposition [x]  Follow-up with provider  Additional Notes: This RN recommended pt schedule an appt but pt refused. Pt states provider told her at last appt she would call in prednisone if symptoms develop. This RN confirmed pt pharmacy:  [id number] [place], [place] - [address]   This RN educated pt on new-worsening symptoms and when to call back/seek emergent care. Pt verbalized understanding and agrees to plan.   Copied from CRM [id number]. Topic: Clinical - Red Word Triage >> Apr 09, 2023 10:44 AM Adele Barthel wrote: Red Word that prompted transfer to Nurse Triage:   Requesting prednisone Dry hack Cough-phlegm is yellow Difficulty breathing Wheezing Symptoms since Saturday   [facility]  Reason for Disposition  [1] MILD difficulty breathing (e.g., minimal/no SOB at rest, SOB with walking, pulse <100) AND [2] NEW-onset or WORSE than normal  Answer Assessment - Initial Assessment Questions Chief Complaint: SOB since Saturday  MEDICINES:   Mucinex; helps some "Using breathing machine quite a bit"  OXYGEN: 3 L Currently 97%; normal reading in 90s  Symptoms: Dry cough, yellow phlegm, chills Pertinent Negatives: Patient denies fever, headache  Protocols used: Breathing Difficulty-A-AH

## 2023-04-09 NOTE — Telephone Encounter (Signed)
 This encounter was created in error - please disregard.

## 2023-04-16 ENCOUNTER — Other Ambulatory Visit (HOSPITAL_BASED_OUTPATIENT_CLINIC_OR_DEPARTMENT_OTHER): Payer: Self-pay

## 2023-04-17 ENCOUNTER — Other Ambulatory Visit: Payer: Self-pay

## 2023-04-17 ENCOUNTER — Other Ambulatory Visit (HOSPITAL_BASED_OUTPATIENT_CLINIC_OR_DEPARTMENT_OTHER): Payer: Self-pay

## 2023-04-19 DIAGNOSIS — W19XXXA Unspecified fall, initial encounter: Secondary | ICD-10-CM | POA: Diagnosis not present

## 2023-04-19 DIAGNOSIS — S4991XA Unspecified injury of right shoulder and upper arm, initial encounter: Secondary | ICD-10-CM | POA: Diagnosis not present

## 2023-04-20 ENCOUNTER — Other Ambulatory Visit (HOSPITAL_BASED_OUTPATIENT_CLINIC_OR_DEPARTMENT_OTHER): Payer: Self-pay

## 2023-04-21 DIAGNOSIS — J449 Chronic obstructive pulmonary disease, unspecified: Secondary | ICD-10-CM | POA: Diagnosis not present

## 2023-04-21 DIAGNOSIS — J9611 Chronic respiratory failure with hypoxia: Secondary | ICD-10-CM | POA: Diagnosis not present

## 2023-04-23 ENCOUNTER — Other Ambulatory Visit (HOSPITAL_BASED_OUTPATIENT_CLINIC_OR_DEPARTMENT_OTHER): Payer: Self-pay

## 2023-05-01 DIAGNOSIS — R079 Chest pain, unspecified: Secondary | ICD-10-CM | POA: Diagnosis not present

## 2023-05-01 DIAGNOSIS — Z9181 History of falling: Secondary | ICD-10-CM | POA: Diagnosis not present

## 2023-05-01 DIAGNOSIS — J449 Chronic obstructive pulmonary disease, unspecified: Secondary | ICD-10-CM | POA: Diagnosis not present

## 2023-05-16 ENCOUNTER — Other Ambulatory Visit: Payer: Self-pay | Admitting: *Deleted

## 2023-05-16 DIAGNOSIS — J441 Chronic obstructive pulmonary disease with (acute) exacerbation: Secondary | ICD-10-CM

## 2023-05-16 NOTE — Progress Notes (Signed)
 pft

## 2023-05-17 ENCOUNTER — Ambulatory Visit: Payer: PPO | Admitting: Emergency Medicine

## 2023-05-21 DIAGNOSIS — J9611 Chronic respiratory failure with hypoxia: Secondary | ICD-10-CM | POA: Diagnosis not present

## 2023-05-30 ENCOUNTER — Other Ambulatory Visit: Payer: Self-pay

## 2023-05-30 ENCOUNTER — Other Ambulatory Visit (HOSPITAL_BASED_OUTPATIENT_CLINIC_OR_DEPARTMENT_OTHER): Payer: Self-pay

## 2023-06-05 DIAGNOSIS — E039 Hypothyroidism, unspecified: Secondary | ICD-10-CM | POA: Diagnosis not present

## 2023-06-05 DIAGNOSIS — E1121 Type 2 diabetes mellitus with diabetic nephropathy: Secondary | ICD-10-CM | POA: Diagnosis not present

## 2023-06-05 DIAGNOSIS — R000 Tachycardia, unspecified: Secondary | ICD-10-CM | POA: Diagnosis not present

## 2023-06-05 DIAGNOSIS — J449 Chronic obstructive pulmonary disease, unspecified: Secondary | ICD-10-CM | POA: Diagnosis not present

## 2023-06-21 DIAGNOSIS — J9611 Chronic respiratory failure with hypoxia: Secondary | ICD-10-CM | POA: Diagnosis not present

## 2023-06-23 ENCOUNTER — Other Ambulatory Visit: Payer: Self-pay | Admitting: Primary Care

## 2023-06-27 ENCOUNTER — Other Ambulatory Visit: Payer: Self-pay

## 2023-06-27 ENCOUNTER — Ambulatory Visit: Payer: Self-pay | Admitting: Emergency Medicine

## 2023-06-27 MED ORDER — PREDNISONE 10 MG PO TABS: ORAL_TABLET | ORAL | 0 refills | Status: DC

## 2023-06-27 NOTE — Telephone Encounter (Signed)
 Ok to send pred > Take 40mg  daily for 3 days, then 30mg  daily for 3 days, then 20mg  daily for 3 days, then 10mg  daily for 3 days, then stop

## 2023-06-27 NOTE — Telephone Encounter (Signed)
 Please advise on RX?

## 2023-06-27 NOTE — Telephone Encounter (Signed)
 Copied from CRM [id number]. Topic: Clinical - Red Word Triage >> Jun 27, 2023  9:53 AM [name] wrote: Red Word that prompted transfer to Nurse Triage: Trouble breathing, requesting prednisone  from Dr. Baldwin Levee.   FYI Only or Action Required?: Action required by provider, requesting Prednisone  for difficulty breathing   Patient is followed in Pulmonology for COPD, last seen on 02/19/2023 by [name] Baumgarten, NP. Called Nurse Triage reporting Shortness of Breath. Symptoms began several days ago. Interventions attempted: Nebulizer treatments. Symptoms are: gradually worsening.  Triage Disposition: See HCP Within 4 Hours (Or PCP Triage)  Patient/caregiver understands and will follow disposition?: No, wishes to speak with PCP   Reason for Disposition  [1] Longstanding difficulty breathing (e.g., CHF, COPD, emphysema) AND [2] WORSE than normal  Answer Assessment - Initial Assessment Questions 1. RESPIRATORY STATUS: Describe your breathing? (e.g., wheezing, shortness of breath, unable to speak, severe coughing)      Shortness of breath  2. ONSET: When did this breathing problem begin?      3 days ago  3. PATTERN Does the difficult breathing come and go, or has it been constant since it started?      Constant  4. SEVERITY: How bad is your breathing? (e.g., mild, moderate, severe)    - MILD: No SOB at rest, mild SOB with walking, speaks normally in sentences, can lie down, no retractions, pulse < 100.    - MODERATE: SOB at rest, SOB with minimal exertion and prefers to sit, cannot lie down flat, speaks in phrases, mild retractions, audible wheezing, pulse 100-120.    - SEVERE: Very SOB at rest, speaks in single words, struggling to breathe, sitting hunched forward, retractions, pulse > 120      Moderate  5. RECURRENT SYMPTOM: Have you had difficulty breathing before? If Yes, ask: When was the last time? and What happened that time?      Yes 6. CARDIAC HISTORY: Do you have any  history of heart disease? (e.g., heart attack, angina, bypass surgery, angioplasty)      Yes 7. LUNG HISTORY: Do you have any history of lung disease?  (e.g., pulmonary embolus, asthma, emphysema)     Yes 8. CAUSE: What do you think is causing the breathing problem?      History of COPD 9. OTHER SYMPTOMS: Do you have any other symptoms? (e.g., dizziness, runny nose, cough, chest pain, fever)     Cough 10. O2 SATURATION MONITOR:  Do you use an oxygen  saturation monitor (pulse oximeter) at home? If Yes, ask: What is your reading (oxygen  level) today? What is your usual oxygen  saturation reading? (e.g., 95%)       No   Patient declined appointment, requesting prescription of prednisone  for her breathing  Protocols used: Breathing Difficulty-A-AH

## 2023-06-27 NOTE — Telephone Encounter (Signed)
 Rx has been sent to pharmacy

## 2023-07-09 ENCOUNTER — Other Ambulatory Visit (HOSPITAL_BASED_OUTPATIENT_CLINIC_OR_DEPARTMENT_OTHER): Payer: Self-pay

## 2023-07-21 DIAGNOSIS — J9611 Chronic respiratory failure with hypoxia: Secondary | ICD-10-CM | POA: Diagnosis not present

## 2023-07-23 DIAGNOSIS — F320 Major depressive disorder, single episode, mild: Secondary | ICD-10-CM | POA: Diagnosis not present

## 2023-07-23 DIAGNOSIS — I10 Essential (primary) hypertension: Secondary | ICD-10-CM | POA: Diagnosis not present

## 2023-07-23 DIAGNOSIS — E1169 Type 2 diabetes mellitus with other specified complication: Secondary | ICD-10-CM | POA: Diagnosis not present

## 2023-07-23 DIAGNOSIS — E039 Hypothyroidism, unspecified: Secondary | ICD-10-CM | POA: Diagnosis not present

## 2023-07-23 DIAGNOSIS — I509 Heart failure, unspecified: Secondary | ICD-10-CM | POA: Diagnosis not present

## 2023-07-23 DIAGNOSIS — N183 Chronic kidney disease, stage 3 unspecified: Secondary | ICD-10-CM | POA: Diagnosis not present

## 2023-07-23 DIAGNOSIS — I130 Hypertensive heart and chronic kidney disease with heart failure and stage 1 through stage 4 chronic kidney disease, or unspecified chronic kidney disease: Secondary | ICD-10-CM | POA: Diagnosis not present

## 2023-07-23 DIAGNOSIS — E1122 Type 2 diabetes mellitus with diabetic chronic kidney disease: Secondary | ICD-10-CM | POA: Diagnosis not present

## 2023-07-23 DIAGNOSIS — E785 Hyperlipidemia, unspecified: Secondary | ICD-10-CM | POA: Diagnosis not present

## 2023-07-23 DIAGNOSIS — G4700 Insomnia, unspecified: Secondary | ICD-10-CM | POA: Diagnosis not present

## 2023-07-23 DIAGNOSIS — J449 Chronic obstructive pulmonary disease, unspecified: Secondary | ICD-10-CM | POA: Diagnosis not present

## 2023-07-23 DIAGNOSIS — K219 Gastro-esophageal reflux disease without esophagitis: Secondary | ICD-10-CM | POA: Diagnosis not present

## 2023-07-31 ENCOUNTER — Ambulatory Visit

## 2023-07-31 ENCOUNTER — Encounter: Payer: Self-pay | Admitting: Primary Care

## 2023-07-31 ENCOUNTER — Ambulatory Visit: Admitting: Primary Care

## 2023-07-31 ENCOUNTER — Telehealth: Payer: Self-pay | Admitting: Primary Care

## 2023-07-31 VITALS — BP 126/72 | HR 106 | Temp 97.4°F | Ht 60.0 in | Wt 117.0 lb

## 2023-07-31 DIAGNOSIS — J9611 Chronic respiratory failure with hypoxia: Secondary | ICD-10-CM

## 2023-07-31 DIAGNOSIS — Z87891 Personal history of nicotine dependence: Secondary | ICD-10-CM

## 2023-07-31 DIAGNOSIS — J449 Chronic obstructive pulmonary disease, unspecified: Secondary | ICD-10-CM | POA: Diagnosis not present

## 2023-07-31 DIAGNOSIS — J441 Chronic obstructive pulmonary disease with (acute) exacerbation: Secondary | ICD-10-CM

## 2023-07-31 LAB — PULMONARY FUNCTION TEST
FEF 25-75 Post: 0.21 L/s
FEF 25-75 Pre: 0.21 L/s
FEF2575-%Change-Post: 0 %
FEF2575-%Pred-Post: 16 %
FEF2575-%Pred-Pre: 16 %
FEV1-%Change-Post: -5 %
FEV1-%Pred-Post: 30 %
FEV1-%Pred-Pre: 31 %
FEV1-Post: 0.49 L
FEV1-Pre: 0.52 L
FEV1FVC-%Change-Post: -4 %
FEV1FVC-%Pred-Pre: 61 %
FEV6-%Change-Post: 0 %
FEV6-%Pred-Post: 53 %
FEV6-%Pred-Pre: 53 %
FEV6-Post: 1.12 L
FEV6-Pre: 1.11 L
FEV6FVC-%Change-Post: 1 %
FEV6FVC-%Pred-Post: 104 %
FEV6FVC-%Pred-Pre: 102 %
FVC-%Change-Post: 0 %
FVC-%Pred-Post: 51 %
FVC-%Pred-Pre: 51 %
FVC-Post: 1.14 L
FVC-Pre: 1.14 L
Post FEV1/FVC ratio: 43 %
Post FEV6/FVC ratio: 98 %
Pre FEV1/FVC ratio: 45 %
Pre FEV6/FVC Ratio: 97 %

## 2023-07-31 MED ORDER — PREDNISONE 10 MG PO TABS: 10.0000 mg | ORAL_TABLET | Freq: Every day | ORAL | 1 refills | Status: DC

## 2023-07-31 NOTE — Progress Notes (Signed)
 @Patient  ID: [name] [name], female    DOB: [birth year], 78 y.o.   MRN: [id number]  Chief Complaint  Patient presents with   Follow-up    PFT results, COPD, Respiratory Failure. Pt uses 3-4L O2 POC and 3L O2 HS    Referring provider: Chrystal Lamarr RAMAN, *  HPI: 78 year old female, former smoker quit 1996 (60-pack-year history).  Past medical history significant for hypertension, pulmonary embolism, coronary artery disease, COPD, vocal cord dysfunction, chronic respiratory failure acute on chronic respiratory failure, type 2 diabetes, dyslipidemia, iron deficiency, obesity.  Patient of Dr. Shelah.   07/31/2023 Discussed the use of AI scribe software for clinical note transcription with the patient, who gave verbal consent to proceed.  History of Present Illness   [name] [name] is a 78 year old female with COPD, vocal cord dysfunction, and chronic respiratory failure who presents for follow-up after a breathing test.  She has a significant smoking history, having quit in 1983 after a 60 pack-year history. She was hospitalized in December for a COPD exacerbation, treated with steroids, antibiotics, and nebulizers. In February, she experienced wheezing and a productive cough, for which she was given a prednisone  taper. She is currently on Breztri , taking two puffs in the morning and evening, and Daliresp  (Roflumilast ), which she finds helpful. She is also on oxygen  therapy, using 3-4 liters, adjusting based on the weather conditions.  She has concerns about the cost of her medications, specifically mentioning a medication that costs $60, and is seeking assistance for it. She is currently receiving Breztri  for free.  She inquires about metformin , which she is taking for diabetes, expressing concerns about its safety due to information she has seen online. No current issues with the medication.  She has experienced weight loss over the past year, from 141 pounds to 117  pounds, but is not concerned about it. She reports no colored mucus production at this time. She mentions having a mammogram in January and a colonoscopy possibly last year or the year before, with no significant findings reported.      Pulmonary function testing 07/31/2023 >> FVC 1.14 (51%), FEV1 0.49 (30%), ratio 43   Allergies  Allergen Reactions   Lisinopril Cough    cough   Sulfonamide Derivatives Rash    Immunization History  Administered Date(s) Administered   Fluad Quad(high Dose 65+) 10/08/2020   Fluzone Influenza virus vaccine,trivalent (IIV3), split virus 10/07/2016, 10/15/2017   Influenza Split 11/29/2010   Influenza Whole 12/09/2008, 10/09/2009, 10/02/2010, 09/10/2011   Influenza, High Dose Seasonal PF 10/07/2016, 10/15/2017, 08/22/2018   Influenza,inj,Quad PF,6+ Mos 10/01/2012, 10/08/2013, 09/30/2014, 10/11/2015   Influenza,inj,quad, With Preservative 10/08/2014   PFIZER(Purple Top)SARS-COV-2 Vaccination 02/13/2019, 03/06/2019   Pneumococcal Conjugate-13 10/09/2013, 11/20/2013   Pneumococcal Polysaccharide-23 06/21/2001, 09/14/2010   Pneumococcal-Unspecified 10/02/2010   Td 06/21/2001   Tdap 09/14/2010   Zoster Recombinant(Shingrix) 02/27/2018, 07/01/2018   Zoster, Live 05/01/2007    Past Medical History:  Diagnosis Date   Allergic rhinitis, cause unspecified    CAD in native artery 07/25/2017   Chronic airway obstruction, not elsewhere classified    COPD (chronic obstructive pulmonary disease) (HCC)    Depressive disorder, not elsewhere classified    Nephritis and nephropathy, not specified as acute or chronic, with unspecified pathological lesion in kidney    Obesity, unspecified    Other and unspecified hyperlipidemia    Oxygen  deficiency    Thyrotoxicosis without mention of goiter or other cause, without mention of thyrotoxic crisis or storm  Unspecified essential hypertension    UTI (urinary tract infection) 2019 dec    Tobacco History: Social  History   Tobacco Use  Smoking Status Former   Current packs/day: 0.00   Average packs/day: 2.0 packs/day for 34.0 years (68.0 ttl pk-yrs)   Types: Cigarettes   Start date: 01/10/1960   Quit date: 01/09/1994   Years since quitting: 29.5  Smokeless Tobacco Never   Counseling given: Not Answered   Outpatient Medications Prior to Visit  Medication Sig Dispense Refill   albuterol  (PROVENTIL ) (2.5 MG/3ML) 0.083% nebulizer solution Take 2.5 mg by nebulization as needed for wheezing or shortness of breath.     albuterol  (VENTOLIN  HFA) 108 (90 Base) MCG/ACT inhaler Inhale 2 puffs into the lungs every 4 (four) hours as needed for wheezing or shortness of breath. 6.7 g 3   Ascorbic Acid (VITAMIN C) 1000 MG tablet Take 1,000 mg by mouth daily.     atorvastatin  (LIPITOR) 80 MG tablet Take 1 tablet (80 mg total) by mouth daily. 90 tablet 3   Budeson-Glycopyrrol-Formoterol  (BREZTRI  AEROSPHERE) 160-9-4.8 MCG/ACT AERO Inhale 2 puffs into the lungs in the morning and at bedtime.     Calcium  Carb-Cholecalciferol (CALCIUM  600+D3 PO) Take 1 tablet by mouth daily.     Cholecalciferol (VITAMIN D-3 PO) Take 1 tablet by mouth daily.     esomeprazole  (NEXIUM ) 40 MG capsule TAKE 1 CAPSULE BY MOUTH 2 TIMES DAILY BEFORE A MEAL. 180 capsule 2   ferrous sulfate  325 (65 FE) MG tablet Take 650 mg by mouth daily.     fluticasone  (FLONASE ) 50 MCG/ACT nasal spray Place 1 spray into both nostrils in the morning and at bedtime.     furosemide  (LASIX ) 20 MG tablet TAKE 1 TABLET BY MOUTH AS NEEDED 90 tablet 3   levocetirizine (XYZAL) 5 MG tablet Take 5 mg by mouth daily.     levothyroxine  (SYNTHROID ) 75 MCG tablet Take 1 tablet by mouth daily.     metFORMIN  (GLUCOPHAGE -XR) 500 MG 24 hr tablet Take 500 mg by mouth every evening.     metoprolol  tartrate (LOPRESSOR ) 50 MG tablet Take 1 tablet (50 mg total) by mouth 2 (two) times daily. 180 tablet 3   Multiple Vitamins-Minerals (CENTRUM PO) Take 1 tablet by mouth daily.      nitroGLYCERIN  (NITROSTAT ) 0.4 MG SL tablet Place 1 tablet (0.4 mg total) under the tongue every 5 (five) minutes as needed for chest pain. 25 tablet 4   roflumilast  (DALIRESP ) 500 MCG TABS tablet Take 1 tablet (500 mcg total) by mouth daily. 30 tablet 11   traZODone  (DESYREL ) 50 MG tablet Take 50 mg by mouth at bedtime.      venlafaxine  XR (EFFEXOR -XR) 150 MG 24 hr capsule Take 150 mg by mouth daily.      predniSONE  (DELTASONE ) 10 MG tablet Take 40mg  daily for 3 days, then 30mg  daily for 3 days, then 20mg  daily for 3 days, then 10mg  daily for 3 days, then stop 30 tablet 0   Roflumilast  (DALIRESP ) 250 MCG TABS Take 1 tablet (250 mcg)  by mouth daily for 28 days. 28 tablet 0   No facility-administered medications prior to visit.   Review of Systems  Review of Systems  Constitutional: Negative.   HENT: Negative.    Respiratory:  Positive for shortness of breath and wheezing. Negative for cough.   Cardiovascular: Negative.    Physical Exam  BP 126/72 (BP Location: Left Arm, Patient Position: Sitting, Cuff Size: Normal)   Pulse ROLLEN)  106   Temp (!) 97.4 F (36.3 C) (Temporal)   Ht 5' (1.524 m)   Wt 117 lb (53.1 kg)   SpO2 96%   BMI 22.85 kg/m  Physical Exam Constitutional:      General: She is not in acute distress.    Appearance: Normal appearance. She is normal weight. She is not ill-appearing.  HENT:     Head: Normocephalic and atraumatic.     Mouth/Throat:     Mouth: Mucous membranes are moist.     Pharynx: Oropharynx is clear.  Cardiovascular:     Rate and Rhythm: Normal rate and regular rhythm.  Pulmonary:     Effort: Pulmonary effort is normal.     Breath sounds: Wheezing present.     Comments: POC 3-4L Musculoskeletal:        General: Normal range of motion.  Skin:    General: Skin is warm and dry.  Neurological:     General: No focal deficit present.     Mental Status: She is alert and oriented to person, place, and time. Mental status is at baseline.  Psychiatric:         Mood and Affect: Mood normal.        Behavior: Behavior normal.        Thought Content: Thought content normal.        Judgment: Judgment normal.      Lab Results:  CBC    Component Value Date/Time   WBC 10.1 02/19/2023 1649   RBC 3.68 (L) 02/19/2023 1649   HGB 10.8 (L) 02/19/2023 1649   HGB 12.7 07/02/2019 1458   HGB 12.5 02/21/2017 1428   HCT 32.7 (L) 02/19/2023 1649   HCT 38.6 02/21/2017 1428   PLT 321.0 02/19/2023 1649   PLT 276 07/02/2019 1458   PLT 252 02/21/2017 1428   MCV 89.0 02/19/2023 1649   MCV 91 02/21/2017 1428   MCH 29.0 12/21/2022 0417   MCHC 33.1 02/19/2023 1649   RDW 15.3 02/19/2023 1649   RDW 13.7 02/21/2017 1428   LYMPHSABS 1.7 02/19/2023 1649   LYMPHSABS 1.6 02/21/2017 1428   MONOABS 0.8 02/19/2023 1649   EOSABS 0.6 02/19/2023 1649   EOSABS 0.4 02/21/2017 1428   BASOSABS 0.1 02/19/2023 1649   BASOSABS 0.0 02/21/2017 1428    BMET    Component Value Date/Time   NA 140 12/21/2022 0417   NA 146 (H) 07/28/2021 0955   K 4.5 12/21/2022 0417   CL 101 12/21/2022 0417   CO2 30 12/21/2022 0417   GLUCOSE 201 (H) 12/21/2022 0417   BUN 28 (H) 12/21/2022 0417   BUN 15 07/28/2021 0955   CREATININE 1.04 (H) 12/21/2022 0417   CALCIUM  8.4 (L) 12/21/2022 0417   GFRNONAA 55 (L) 12/21/2022 0417   GFRAA 48 (L) 01/01/2018 1145    BNP    Component Value Date/Time   BNP 134.2 (H) 12/19/2022 1821    ProBNP    Component Value Date/Time   PROBNP 47.0 04/25/2022 1000    Imaging: No results found.   Assessment & Plan:   1. Chronic obstructive pulmonary disease, unspecified COPD type (HCC) (Primary)  2. Chronic respiratory failure with hypoxia (HCC)  Assessment and Plan    Chronic Obstructive Pulmonary Disease (COPD) Severe COPD with a history of frequent exacerbations. Currently on Breztri  and Daliresp . Spirometry shows very severe obstructive lung disease, FEV1 30%. Symptoms include wheezing and productive cough, improved with prednisone   taper. Current management includes triple therapy inhaler (Breztri ). Last  course of prednisone  was in June. We reviewed risk and benefits chronic prednisone  usage, patient would like to start medication and she noticed significant benefit  - Prescribe prednisone  10 mg daily with one refill - Check for patient assistance for Daliresp  - Consider switching to Ohtuvayre  if Daliresp  assistance is unavailable - Recommend pulmonary rehabilitation, either in-person or virtual with [facility]  Chronic Respiratory Failure Chronic respiratory failure managed with supplemental oxygen . Oxygen  use adjusted based on environmental conditions, currently at 3-4 liters per minute. - Continue oxygen  therapy at 3-4 L/min as needed - Use a portable fan to assist with heat management  Weight Loss Weight loss from 141 lbs to 117 lbs over the past year and a half. BMI is normal at 22. She is up to date with mammogram and colonoscopy.  CXR in December 2024 was normal.     Almarie LELON Ferrari, NP 07/31/2023

## 2023-07-31 NOTE — Patient Instructions (Addendum)
  VISIT SUMMARY: Today, you had a follow-up appointment to discuss your chronic respiratory conditions and review your recent breathing test results. We also addressed your concerns about medication costs and the safety of metformin  for your diabetes.  YOUR PLAN: -CHRONIC OBSTRUCTIVE PULMONARY DISEASE (COPD): COPD is a chronic lung condition that makes it hard to breathe. You are currently on Breztri  and Daliresp , and your spirometry shows severe obstructive lung disease. We will continue your current medications and add prednisone  10 mg daily with one refill. We will also check for patient assistance for Daliresp  and consider switching to Ohtuvayre  if assistance is unavailable. Pulmonary rehabilitation is recommended, either in-person or virtual.  -SEVERE OBSTRUCTIVE LUNG DISEASE: This is a severe form of lung disease that can increase your risk of hospitalization if you get a respiratory infection. Your current management includes the triple therapy inhaler, Breztri .  -CHRONIC RESPIRATORY FAILURE: This condition means your lungs are not getting enough oxygen  into your blood. You are managing this with supplemental oxygen  at 3-4 liters per minute, adjusted based on the weather. Continue using your oxygen  therapy as needed and use a portable fan to help with heat management.  -WEIGHT LOSS: You have lost weight over the past year, going from 141 lbs to 117 lbs. Your BMI is currently normal at 22, and there are no immediate concerns about this weight loss.  INSTRUCTIONS: We will send a message to Dr. Shelah to confirm your low-dose prednisone  regimen. Please continue your current medications and oxygen  therapy as discussed. If you have any concerns or experience any new symptoms, please contact our office. Consider enrolling in pulmonary rehabilitation, either in-person or virtual, to help manage your COPD.  Follow-up 3 months with Dr. Byrum or sooner if needed

## 2023-07-31 NOTE — Patient Instructions (Signed)
 Patient unable to complete full pft. Pre/post spirometry performed today.

## 2023-07-31 NOTE — Telephone Encounter (Signed)
 Daliresp  does not have a patient assistance program since it is available as generic roflumilast . Unfortunately there are no COPD grants open at this time either  No way to know if Ohtuvayre  will be more cost effective (not billed through pharmacy benefit generally for Medicare patients) but since it is a specialty medications that is billed through Part B and patient appears not to have a Part G supplemental plan, it is likely to be unaffordable for her. Ohtuvayre  DOES have a patient assistance program but patients must provide proof that they have spent $2000 out of pocket on medical expenses in 2025 calendar year to qualify  Sherry Pennant, PharmD, MPH, BCPS, CPP Clinical Pharmacist (Rheumatology and Pulmonology)

## 2023-07-31 NOTE — Telephone Encounter (Signed)
 Let patient know there is no assistance program for daliresp . Ohtuvayre  nebulizer would likely not be affordable but they do have an assistance program if she can prove she has spent 2,000 dollars out of pocket on medical expense in 2025 to qualify. Ask her if she would like to continue daliresp  or see if she can apply for patient assistance for nebulized ohtuvayre 

## 2023-07-31 NOTE — Telephone Encounter (Signed)
 Is there patient assistance for Daliresp , medication is costing her 60 dollars a month and she is on a fixed income. It is helping her. Otherwise do you know if Ohtuvayre  would be more affordable.   She is current getting Breztri  patient assistance

## 2023-07-31 NOTE — Progress Notes (Signed)
 Patient unable to complete full pft. Pre and post spirometry performed. Patient had 2 failed attempts during dlco she was unable to hold her breath 7 seconds. Patient unable to keep pant frequency during lung volumes.

## 2023-08-02 NOTE — Telephone Encounter (Signed)
 I called and spoke to pt. Pt informed of Beth's note and verbalized understanding. Pt would like to try to Ohtuvayre . I can write our the paper for this once I am back at the [facility] location.

## 2023-08-06 NOTE — Telephone Encounter (Signed)
 Thisw has been filled out. I will mail the papers to the pt and have her mail back to our office for the prescribers signature. NFN as of right now.

## 2023-08-07 ENCOUNTER — Telehealth: Payer: Self-pay | Admitting: *Deleted

## 2023-08-07 ENCOUNTER — Ambulatory Visit: Payer: Self-pay | Admitting: Primary Care

## 2023-08-07 NOTE — Telephone Encounter (Signed)
 Copied from CRM [id number]. Topic: Clinical - Prescription Issue >> Aug 06, 2023 10:52 AM Russell PARAS wrote: Reason for CRM:   Pt is contacting clinic regarding the new medication  Ohtuvayre  that was mentioned at her last visit. This was suggested due to no patient assistance program avail for Daliresp . Reviewed chart and advised Ashlyn had began process of obtaining patient assistance for the Ohtuvayre , last noted on 07/24.   Pt will be out of Daliresp  the end of the week and would like to speak with nurse to discuss if she should refill the Daliresp  or wait for the patient assistance for Ohtuvayre  to process.   CB#  [phone]  I called and spoke with the pt  I advised since we are still in the process of getting her Ohtuvayre , she should get her Daliresp  refilled again  She is aware we will update her as we get info about Ohtuvayre   Nothing further needed

## 2023-08-08 ENCOUNTER — Other Ambulatory Visit (HOSPITAL_BASED_OUTPATIENT_CLINIC_OR_DEPARTMENT_OTHER): Payer: Self-pay

## 2023-08-08 ENCOUNTER — Other Ambulatory Visit: Payer: Self-pay

## 2023-08-13 ENCOUNTER — Telehealth: Payer: Self-pay | Admitting: Primary Care

## 2023-08-13 NOTE — Telephone Encounter (Signed)
 Patient is dropping off form for Ohtuvayre . Form will be placed in pharmacy box.

## 2023-08-20 ENCOUNTER — Other Ambulatory Visit: Payer: Self-pay | Admitting: Primary Care

## 2023-08-20 ENCOUNTER — Other Ambulatory Visit: Payer: Self-pay | Admitting: *Deleted

## 2023-08-20 ENCOUNTER — Other Ambulatory Visit (HOSPITAL_BASED_OUTPATIENT_CLINIC_OR_DEPARTMENT_OTHER): Payer: Self-pay | Admitting: Family

## 2023-08-20 DIAGNOSIS — E785 Hyperlipidemia, unspecified: Secondary | ICD-10-CM

## 2023-08-20 MED ORDER — ALBUTEROL SULFATE HFA 108 (90 BASE) MCG/ACT IN AERS: 2.0000 | INHALATION_SPRAY | RESPIRATORY_TRACT | 3 refills | Status: AC | PRN

## 2023-08-21 DIAGNOSIS — J9611 Chronic respiratory failure with hypoxia: Secondary | ICD-10-CM | POA: Diagnosis not present

## 2023-08-22 NOTE — Telephone Encounter (Signed)
 Received Ohtuvayre  paperwork. Pharmacy team will start BIV.

## 2023-08-27 ENCOUNTER — Telehealth: Payer: Self-pay

## 2023-08-27 NOTE — Telephone Encounter (Signed)
 Received Ohtuvayre  new start paperwork. Completed form and faxed with clinicals and insurance card copy to [facility]   Phone#: [phone] Fax#: [phone]

## 2023-08-29 NOTE — Telephone Encounter (Signed)
 Received fax from [employer] with summary of benefits. Referral form for Ohtuvayre  received. Rx will be triaged to [facility].. Once benefits investigation completed, pharmacy will reach out the patient to schedule shipment. If medication is unaffordable, patient will need to express financial hardship to be referred back to [place] [facility] for patient assistance program pre-screening.   Patient ID: [id number] Pharmacy phone: [phone] [place] [facility] Phone#: [phone]

## 2023-08-31 NOTE — Telephone Encounter (Signed)
 Received notification from [employer] regarding a prior authorization for OHTUVAYRE . Authorization has been APPROVED from 08/31/2023 to 01/09/2024. Approval letter sent to scan center.  Authorization # [id number]

## 2023-08-31 NOTE — Telephone Encounter (Signed)
 Submitted a Prior Authorization request to [facility] [employer] for OHTUVAYRE  via CoverMyMeds. Will update once we receive a response.  Key: [id number]

## 2023-09-05 NOTE — Telephone Encounter (Signed)
 Prior authorization approval letter faxed to [employer] [phone]) and [facility] [phone])

## 2023-09-17 ENCOUNTER — Other Ambulatory Visit: Payer: Self-pay

## 2023-09-21 ENCOUNTER — Other Ambulatory Visit (HOSPITAL_BASED_OUTPATIENT_CLINIC_OR_DEPARTMENT_OTHER): Payer: Self-pay | Admitting: Family

## 2023-09-21 DIAGNOSIS — I2510 Atherosclerotic heart disease of native coronary artery without angina pectoris: Secondary | ICD-10-CM

## 2023-09-21 DIAGNOSIS — J9611 Chronic respiratory failure with hypoxia: Secondary | ICD-10-CM | POA: Diagnosis not present

## 2023-09-21 DIAGNOSIS — I10 Essential (primary) hypertension: Secondary | ICD-10-CM

## 2023-09-26 ENCOUNTER — Other Ambulatory Visit: Payer: Self-pay | Admitting: Primary Care

## 2023-09-30 ENCOUNTER — Other Ambulatory Visit (HOSPITAL_BASED_OUTPATIENT_CLINIC_OR_DEPARTMENT_OTHER): Payer: Self-pay | Admitting: Family

## 2023-09-30 DIAGNOSIS — R600 Localized edema: Secondary | ICD-10-CM

## 2023-10-02 DIAGNOSIS — E039 Hypothyroidism, unspecified: Secondary | ICD-10-CM | POA: Diagnosis not present

## 2023-10-02 DIAGNOSIS — E1165 Type 2 diabetes mellitus with hyperglycemia: Secondary | ICD-10-CM | POA: Diagnosis not present

## 2023-10-02 DIAGNOSIS — Z23 Encounter for immunization: Secondary | ICD-10-CM | POA: Diagnosis not present

## 2023-10-02 DIAGNOSIS — N183 Chronic kidney disease, stage 3 unspecified: Secondary | ICD-10-CM | POA: Diagnosis not present

## 2023-10-02 DIAGNOSIS — Z9981 Dependence on supplemental oxygen: Secondary | ICD-10-CM | POA: Diagnosis not present

## 2023-10-02 DIAGNOSIS — J9611 Chronic respiratory failure with hypoxia: Secondary | ICD-10-CM | POA: Diagnosis not present

## 2023-10-02 DIAGNOSIS — J439 Emphysema, unspecified: Secondary | ICD-10-CM | POA: Diagnosis not present

## 2023-10-21 DIAGNOSIS — J9611 Chronic respiratory failure with hypoxia: Secondary | ICD-10-CM | POA: Diagnosis not present

## 2023-10-22 ENCOUNTER — Other Ambulatory Visit (HOSPITAL_BASED_OUTPATIENT_CLINIC_OR_DEPARTMENT_OTHER): Payer: Self-pay | Admitting: Family

## 2023-10-22 DIAGNOSIS — I10 Essential (primary) hypertension: Secondary | ICD-10-CM

## 2023-10-22 DIAGNOSIS — I2510 Atherosclerotic heart disease of native coronary artery without angina pectoris: Secondary | ICD-10-CM

## 2023-10-23 ENCOUNTER — Other Ambulatory Visit (HOSPITAL_BASED_OUTPATIENT_CLINIC_OR_DEPARTMENT_OTHER): Payer: Self-pay

## 2023-10-27 ENCOUNTER — Other Ambulatory Visit (HOSPITAL_BASED_OUTPATIENT_CLINIC_OR_DEPARTMENT_OTHER): Payer: Self-pay | Admitting: Family

## 2023-10-27 DIAGNOSIS — R600 Localized edema: Secondary | ICD-10-CM

## 2023-11-07 ENCOUNTER — Ambulatory Visit: Admitting: Emergency Medicine

## 2023-11-09 ENCOUNTER — Encounter: Payer: Self-pay | Admitting: Emergency Medicine

## 2023-11-09 ENCOUNTER — Telehealth: Payer: Self-pay | Admitting: Emergency Medicine

## 2023-11-09 ENCOUNTER — Ambulatory Visit: Admitting: Emergency Medicine

## 2023-11-09 ENCOUNTER — Other Ambulatory Visit (HOSPITAL_BASED_OUTPATIENT_CLINIC_OR_DEPARTMENT_OTHER): Payer: Self-pay | Admitting: Family

## 2023-11-09 VITALS — BP 128/74 | HR 81 | Temp 98.4°F | Ht 60.0 in | Wt 118.2 lb

## 2023-11-09 DIAGNOSIS — K219 Gastro-esophageal reflux disease without esophagitis: Secondary | ICD-10-CM | POA: Diagnosis not present

## 2023-11-09 DIAGNOSIS — J301 Allergic rhinitis due to pollen: Secondary | ICD-10-CM

## 2023-11-09 DIAGNOSIS — J441 Chronic obstructive pulmonary disease with (acute) exacerbation: Secondary | ICD-10-CM

## 2023-11-09 DIAGNOSIS — J383 Other diseases of vocal cords: Secondary | ICD-10-CM | POA: Diagnosis not present

## 2023-11-09 DIAGNOSIS — J9611 Chronic respiratory failure with hypoxia: Secondary | ICD-10-CM | POA: Diagnosis not present

## 2023-11-09 DIAGNOSIS — I2510 Atherosclerotic heart disease of native coronary artery without angina pectoris: Secondary | ICD-10-CM

## 2023-11-09 DIAGNOSIS — I10 Essential (primary) hypertension: Secondary | ICD-10-CM

## 2023-11-09 NOTE — Assessment & Plan Note (Signed)
 Severe COPD, limited with her daily activities.  She did benefit from the addition of Daliresp .  Is costing her $60 per month.  It looks like she has been approved for PA and financial assistance for Ohtuvayre .  If this ends up being less expensive then we could switch the Daliresp  to nebulized formulation.  Will inquire with our pharmacy team.  Flu shot is up-to-date.  She does not want COVID-vaccine.  Plan to continue regimen as below  Please continue your Breztri  2 puffs twice a day.  Rinse and gargle after using. Keep albuterol  available to use 2 puffs or 1 nebulizer treatment up to every 4 hours if needed for shortness of breath, chest tightness, wheezing.  Continue prednisone  10 mg once daily. Continue your Daliresp  once daily for now.  We will look into the cost of Ohtuvayre  with patient assistance.  If this is more affordable then we can consider changing the Daliresp  over to Ohtuvayre . Follow in our office in about 3 months.  Call sooner if you have any problems.

## 2023-11-09 NOTE — Telephone Encounter (Signed)
 Spoke to patient - recommend she be screened for patient assistance through [employer] as it is unclear if she went through this process. It sounds like she was given a high copay, but thereafter there was not an attempt to be screened for patient assistance.   She was unable to take down [employer] number at the time of our call as she was at the store. She does not use MyChart. She requests a call Monday to discuss at that time. Will call her Monday.

## 2023-11-09 NOTE — Assessment & Plan Note (Signed)
Continue your Nexium twice a day. ?

## 2023-11-09 NOTE — Assessment & Plan Note (Signed)
 Continue your fluticasone  nasal spray as you have been taking it. Continue Allegra

## 2023-11-09 NOTE — Assessment & Plan Note (Signed)
 Wear your oxygen  at 3-4 L/min depending on your level of exertion.  Continue it while sleeping as well.

## 2023-11-09 NOTE — Assessment & Plan Note (Signed)
 Intermittently problematic but is not active at this time.  Continue to work to treat allergic rhinitis, GERD as aggressively as possible.  We will continue to avoid powdered inhalers as well.

## 2023-11-09 NOTE — Progress Notes (Signed)
 Subjective:   Patient ID: [name] [name] [name], female    DOB: [birth year], 78 y.o.   MRN: [id number]  HPI  ROV 10/03/2022 --pleasant 78 year old woman with a history of moderate COPD as well as upper airway irritation syndrome and chronic cough.  These are impacted by chronic GERD and allergic rhinitis.  She has associated chronic hypoxemic respiratory failure due to her obstructive disease as well as superimposed restriction from body habitus.  Oxygen  is currently at liters per minute. Maintenance regimen is Breztri , Flonase , Nexium .  She is using albuterol  2-3x a day lately. She needs a new pharmacy.  She has been doing fairly well, has started to have some increased nasal congestion drainage and cough over the last week.   ROV 11/09/2023 --[name] [name] is 54 with moderate COPD and fairly significant upper airway irritation syndrome with chronic cough exacerbated by GERD and allergic rhinitis.  She has severe obstruction and restriction on pulmonary function testing with associated hypoxemic respiratory failure.  She is on oxygen  at 3-4 L/min at all times Currently managed on Breztri , prednisone  10mg  every day, Daliresp  (new since I last saw her). It looks like she has a PA for Ohtuvayre  and may be able to get financial assistance - she may want to change the daliresp  to this based on bottom line cost. She uses albuterol  several times a day for dyspnea. Flu shot up to date. She has not had covid vaccine.   Remains on xyzal / allegra, Flonase , Nexium  bid  Pulmonary function testing 05/16/2023 reviewed by me showed severe obstruction without a bronchodilator response, unable to do either DLCO or lung volumes.   Objective:   Vitals:   11/09/23 1132  BP: 128/74  Pulse: 81  Temp: 98.4 F (36.9 C)  SpO2: 92%  Weight: 118 lb 3.2 oz (53.6 kg)  Height: 5' (1.524 m)    Gen: Pleasant, obese, in no distress,  normal affect  ENT: No lesions,  mouth clear,  oropharynx clear, no postnasal drip, strong voice  today  Neck: No JVD, intermittent low pitched UA noise  Lungs: No use of accessory muscles, distant, no wheeze today  Cardiovascular: RRR, heart sounds normal, no murmur or gallops, no peripheral edema  Musculoskeletal: No deformities, no cyanosis or clubbing  Neuro: alert, non focal  Skin: Warm, no lesions or rashes   Assessment & Plan:  COPD (chronic obstructive pulmonary disease) (HCC) Severe COPD, limited with her daily activities.  She did benefit from the addition of Daliresp .  Is costing her $60 per month.  It looks like she has been approved for PA and financial assistance for Ohtuvayre .  If this ends up being less expensive then we could switch the Daliresp  to nebulized formulation.  Will inquire with our pharmacy team.  Flu shot is up-to-date.  She does not want COVID-vaccine.  Plan to continue regimen as below  Please continue your Breztri  2 puffs twice a day.  Rinse and gargle after using. Keep albuterol  available to use 2 puffs or 1 nebulizer treatment up to every 4 hours if needed for shortness of breath, chest tightness, wheezing.  Continue prednisone  10 mg once daily. Continue your Daliresp  once daily for now.  We will look into the cost of Ohtuvayre  with patient assistance.  If this is more affordable then we can consider changing the Daliresp  over to Ohtuvayre . Follow in our office in about 3 months.  Call sooner if you have any problems.  Allergic rhinitis Continue your fluticasone  nasal spray as you have  been taking it. Continue Allegra  Chronic respiratory failure with hypoxia (HCC) Wear your oxygen  at 3-4 L/min depending on your level of exertion.  Continue it while sleeping as well.  GERD (gastroesophageal reflux disease) Continue your Nexium  twice a day  Vocal cord dysfunction Intermittently problematic but is not active at this time.  Continue to work to treat allergic rhinitis, GERD as aggressively as possible.  We will continue to avoid powdered inhalers  as well.   I personally spent a total of 40 minutes in the care of the patient today including preparing to see the patient, getting/reviewing separately obtained history, performing a medically appropriate exam/evaluation, counseling and educating, placing orders, referring and communicating with other health care professionals, documenting clinical information in the EHR, independently interpreting results, and communicating results.      Lamar Chris, MD, PhD 11/09/2023, 12:17 PM [facility] [phone] or if no answer [phone]

## 2023-11-09 NOTE — Telephone Encounter (Signed)
 It looks like the patient has had a successful PA for Ohtuvayre  and that this has been sent for consideration for possible financial assistance.  Patient would like to know the cost to her if financial assistance is obtained.  She is paying $60 a month for Daliresp  now.  If the Ohtuvayre  is more affordable then she would be interested in trying to make a switch.

## 2023-11-09 NOTE — Patient Instructions (Addendum)
 Please continue your Breztri  2 puffs twice a day.  Rinse and gargle after using. Keep albuterol  available to use 2 puffs or 1 nebulizer treatment up to every 4 hours if needed for shortness of breath, chest tightness, wheezing.  Continue prednisone  10 mg once daily. Continue your Daliresp  once daily for now.  We will look into the cost of Ohtuvayre  with patient assistance.  If this is more affordable then we can consider changing the Daliresp  over to Ohtuvayre . Continue your fluticasone  nasal spray as you have been taking it. Continue Allegra Continue your Nexium  twice a day Wear your oxygen  at 3-4 L/min depending on your level of exertion.  Continue it while sleeping as well. Follow in our office in about 3 months.  Call sooner if you have any problems.

## 2023-11-13 NOTE — Telephone Encounter (Signed)
 Received message from Dr. Shelah in separate thread 11/09/23 -   It looks like the patient has had a successful PA for Ohtuvayre  and that this has been sent for consideration for possible financial assistance. Patient would like to know the cost to her if financial assistance is obtained. She is paying $60 a month for Daliresp  now. If the Ohtuvayre  is more affordable then she would be interested in trying to make a switch.   Spoke to patient and provided [employer] number to call to be screened for financial assistance. She plans to call today.

## 2023-11-13 NOTE — Telephone Encounter (Signed)
 Spoke to patient and provided [employer] number to call to be screened for financial assistance. Updates in separate thread - Specialty [facility] (Ohtuvayre )

## 2023-11-20 DIAGNOSIS — E039 Hypothyroidism, unspecified: Secondary | ICD-10-CM | POA: Diagnosis not present

## 2023-11-21 DIAGNOSIS — J9611 Chronic respiratory failure with hypoxia: Secondary | ICD-10-CM | POA: Diagnosis not present

## 2023-11-27 ENCOUNTER — Encounter: Payer: Self-pay | Admitting: Hematology and Oncology

## 2023-11-27 ENCOUNTER — Other Ambulatory Visit (HOSPITAL_BASED_OUTPATIENT_CLINIC_OR_DEPARTMENT_OTHER): Payer: Self-pay

## 2023-11-28 ENCOUNTER — Other Ambulatory Visit: Payer: Self-pay | Admitting: Primary Care

## 2023-12-03 ENCOUNTER — Other Ambulatory Visit (HOSPITAL_BASED_OUTPATIENT_CLINIC_OR_DEPARTMENT_OTHER): Payer: Self-pay

## 2023-12-05 ENCOUNTER — Other Ambulatory Visit (HOSPITAL_BASED_OUTPATIENT_CLINIC_OR_DEPARTMENT_OTHER): Payer: Self-pay | Admitting: Family

## 2023-12-05 DIAGNOSIS — I10 Essential (primary) hypertension: Secondary | ICD-10-CM

## 2023-12-05 DIAGNOSIS — I2510 Atherosclerotic heart disease of native coronary artery without angina pectoris: Secondary | ICD-10-CM

## 2023-12-11 ENCOUNTER — Other Ambulatory Visit: Payer: Self-pay | Admitting: Cardiovascular Disease

## 2023-12-11 DIAGNOSIS — R600 Localized edema: Secondary | ICD-10-CM

## 2023-12-13 ENCOUNTER — Telehealth: Payer: Self-pay

## 2023-12-13 ENCOUNTER — Other Ambulatory Visit: Payer: Self-pay | Admitting: Cardiovascular Disease

## 2023-12-13 DIAGNOSIS — R600 Localized edema: Secondary | ICD-10-CM

## 2023-12-13 NOTE — Telephone Encounter (Signed)
 Copied from CRM #[id number]. Topic: Clinical - Medical Advice >> Dec 11, 2023 10:54 AM Devaughn RAMAN wrote: Reason for CRM: Pt has seen her PCP and she stated she has been on different dosages and changes of thyroid  medication and it has affect her weight causing her to gain weight, pt stated NP Hope advised her to lose weight and she had been but she has since been gaining due to the changes in the thyroid  medication. Pt would like a f/u call back regarding what NP Hope recommends.  Please advise

## 2023-12-14 NOTE — Telephone Encounter (Signed)
 I would address weight gain due to thyroid  medication with her PCP. Her BMI is normal, and appears stable since I last saw her in July.

## 2023-12-14 NOTE — Telephone Encounter (Signed)
 Informed patient of beths note,patient verbalized understanding.NFN

## 2023-12-17 MED ORDER — FUROSEMIDE 20 MG PO TABS: 20.0000 mg | ORAL_TABLET | ORAL | 0 refills | Status: AC | PRN

## 2023-12-18 DIAGNOSIS — E039 Hypothyroidism, unspecified: Secondary | ICD-10-CM | POA: Diagnosis not present

## 2023-12-21 ENCOUNTER — Other Ambulatory Visit: Payer: Self-pay | Admitting: Cardiovascular Disease

## 2023-12-21 DIAGNOSIS — I2510 Atherosclerotic heart disease of native coronary artery without angina pectoris: Secondary | ICD-10-CM

## 2023-12-21 DIAGNOSIS — J9611 Chronic respiratory failure with hypoxia: Secondary | ICD-10-CM | POA: Diagnosis not present

## 2023-12-21 DIAGNOSIS — I10 Essential (primary) hypertension: Secondary | ICD-10-CM

## 2023-12-25 ENCOUNTER — Other Ambulatory Visit: Payer: Self-pay | Admitting: Cardiovascular Disease

## 2023-12-25 ENCOUNTER — Telehealth: Payer: Self-pay

## 2023-12-25 DIAGNOSIS — I2510 Atherosclerotic heart disease of native coronary artery without angina pectoris: Secondary | ICD-10-CM

## 2023-12-25 DIAGNOSIS — I10 Essential (primary) hypertension: Secondary | ICD-10-CM

## 2023-12-25 NOTE — Telephone Encounter (Signed)
 Please have her stop the medication. We will need to follow up w her either as an OV or by phone to see how she is doing off of it.

## 2023-12-25 NOTE — Telephone Encounter (Signed)
 Copied from CRM #[id number]. Topic: Clinical - Medical Advice >> Dec 25, 2023 11:12 AM Corean SAUNDERS wrote: Reason for CRM: Patient is worried about the roflumilast  (DALIRESP ) 500 MCG TABS tablet [[id number]] prescribed by Almarie Ferrari as she states she has has put on 20 pounds from it and it is her giving her the shakes. Patient is requesting a call back.  Pt states she has been taking Daliresp  x2 days. Pt states her hands/arms are shaking profusely since taking Daliresp . Pt also complains of weight gain of 10+ pounds in the past two days, but could possibly be making pt gain more weight & headaches. Pt has not started taking Ohtuvayre .  Dr. Shelah can you please advise.

## 2023-12-25 NOTE — Telephone Encounter (Signed)
 Called and spoke with the pt regarding RB note. Pt verbalized understanding and is scheduled to see Beth in Feb. Pt will call back and let us  know how she is doing in the next few weeks.

## 2023-12-26 ENCOUNTER — Other Ambulatory Visit (HOSPITAL_BASED_OUTPATIENT_CLINIC_OR_DEPARTMENT_OTHER): Payer: Self-pay | Admitting: Cardiovascular Disease

## 2023-12-26 DIAGNOSIS — I2510 Atherosclerotic heart disease of native coronary artery without angina pectoris: Secondary | ICD-10-CM

## 2023-12-26 DIAGNOSIS — I10 Essential (primary) hypertension: Secondary | ICD-10-CM

## 2024-01-07 ENCOUNTER — Other Ambulatory Visit (HOSPITAL_BASED_OUTPATIENT_CLINIC_OR_DEPARTMENT_OTHER): Payer: Self-pay | Admitting: Cardiovascular Disease

## 2024-01-07 DIAGNOSIS — I2510 Atherosclerotic heart disease of native coronary artery without angina pectoris: Secondary | ICD-10-CM

## 2024-01-07 DIAGNOSIS — I10 Essential (primary) hypertension: Secondary | ICD-10-CM

## 2024-01-09 ENCOUNTER — Other Ambulatory Visit (HOSPITAL_BASED_OUTPATIENT_CLINIC_OR_DEPARTMENT_OTHER): Payer: Self-pay

## 2024-01-14 ENCOUNTER — Other Ambulatory Visit (HOSPITAL_BASED_OUTPATIENT_CLINIC_OR_DEPARTMENT_OTHER): Payer: Self-pay

## 2024-01-21 ENCOUNTER — Other Ambulatory Visit: Payer: Self-pay

## 2024-01-21 DIAGNOSIS — I10 Essential (primary) hypertension: Secondary | ICD-10-CM

## 2024-01-21 DIAGNOSIS — I2510 Atherosclerotic heart disease of native coronary artery without angina pectoris: Secondary | ICD-10-CM

## 2024-01-22 ENCOUNTER — Telehealth: Payer: Self-pay | Admitting: Primary Care

## 2024-01-22 DIAGNOSIS — J441 Chronic obstructive pulmonary disease with (acute) exacerbation: Secondary | ICD-10-CM

## 2024-01-22 DIAGNOSIS — J449 Chronic obstructive pulmonary disease, unspecified: Secondary | ICD-10-CM

## 2024-01-22 DIAGNOSIS — R053 Chronic cough: Secondary | ICD-10-CM

## 2024-01-22 NOTE — Telephone Encounter (Unsigned)
 Copied from CRM [id number]. Topic: Clinical - Medication Refill >> Jan 22, 2024  8:58 AM Isabell A wrote: Medication: predniSONE  (DELTASONE ) 10 MG tablet [[id number]]  Has the patient contacted their pharmacy? No (Agent: If no, request that the patient contact the pharmacy for the refill. If patient does not wish to contact the pharmacy document the reason why and proceed with request.) (Agent: If yes, when and what did the pharmacy advise?)  This is the patient's preferred pharmacy:  [id number] [address], [place] - [address] [place] [address] Phone: [phone] Fax: [phone]   Is this the correct pharmacy for this prescription? Yes If no, delete pharmacy and type the correct one.   Has the prescription been filled recently? Yes  Is the patient out of the medication? Yes  Has the patient been seen for an appointment in the last year OR does the patient have an upcoming appointment? Yes  Can we respond through MyChart? No  Agent: Please be advised that Rx refills may take up to 3 business days. We ask that you follow-up with your pharmacy.

## 2024-01-23 MED ORDER — PREDNISONE 10 MG PO TABS: 10.0000 mg | ORAL_TABLET | Freq: Every day | ORAL | 1 refills | Status: AC

## 2024-01-23 NOTE — Telephone Encounter (Signed)
 Prednisone  refill sent. Pt notified

## 2024-01-27 ENCOUNTER — Other Ambulatory Visit (HOSPITAL_BASED_OUTPATIENT_CLINIC_OR_DEPARTMENT_OTHER): Payer: Self-pay | Admitting: Cardiovascular Disease

## 2024-01-27 DIAGNOSIS — I2510 Atherosclerotic heart disease of native coronary artery without angina pectoris: Secondary | ICD-10-CM

## 2024-01-27 DIAGNOSIS — I10 Essential (primary) hypertension: Secondary | ICD-10-CM

## 2024-01-28 ENCOUNTER — Inpatient Hospital Stay (HOSPITAL_COMMUNITY)
Admission: EM | Admit: 2024-01-28 | Discharge: 2024-02-01 | DRG: 191 | Disposition: A | Discharge status: Home / Self Care | Attending: Student | Admitting: Student

## 2024-01-28 ENCOUNTER — Other Ambulatory Visit: Payer: Self-pay

## 2024-01-28 ENCOUNTER — Emergency Department (HOSPITAL_COMMUNITY)

## 2024-01-28 DIAGNOSIS — Z86711 Personal history of pulmonary embolism: Secondary | ICD-10-CM

## 2024-01-28 DIAGNOSIS — Z87440 Personal history of urinary (tract) infections: Secondary | ICD-10-CM

## 2024-01-28 DIAGNOSIS — K219 Gastro-esophageal reflux disease without esophagitis: Secondary | ICD-10-CM | POA: Diagnosis present

## 2024-01-28 DIAGNOSIS — N179 Acute kidney failure, unspecified: Secondary | ICD-10-CM | POA: Diagnosis not present

## 2024-01-28 DIAGNOSIS — Z87891 Personal history of nicotine dependence: Secondary | ICD-10-CM

## 2024-01-28 DIAGNOSIS — E785 Hyperlipidemia, unspecified: Secondary | ICD-10-CM | POA: Diagnosis not present

## 2024-01-28 DIAGNOSIS — Z8701 Personal history of pneumonia (recurrent): Secondary | ICD-10-CM

## 2024-01-28 DIAGNOSIS — E875 Hyperkalemia: Secondary | ICD-10-CM | POA: Diagnosis not present

## 2024-01-28 DIAGNOSIS — Z7951 Long term (current) use of inhaled steroids: Secondary | ICD-10-CM

## 2024-01-28 DIAGNOSIS — E1122 Type 2 diabetes mellitus with diabetic chronic kidney disease: Secondary | ICD-10-CM | POA: Diagnosis present

## 2024-01-28 DIAGNOSIS — I10 Essential (primary) hypertension: Secondary | ICD-10-CM | POA: Diagnosis present

## 2024-01-28 DIAGNOSIS — R269 Unspecified abnormalities of gait and mobility: Secondary | ICD-10-CM | POA: Diagnosis not present

## 2024-01-28 DIAGNOSIS — J9621 Acute and chronic respiratory failure with hypoxia: Secondary | ICD-10-CM | POA: Diagnosis present

## 2024-01-28 DIAGNOSIS — Z79890 Hormone replacement therapy (postmenopausal): Secondary | ICD-10-CM

## 2024-01-28 DIAGNOSIS — Z794 Long term (current) use of insulin: Secondary | ICD-10-CM

## 2024-01-28 DIAGNOSIS — Z602 Problems related to living alone: Secondary | ICD-10-CM | POA: Diagnosis present

## 2024-01-28 DIAGNOSIS — F32A Depression, unspecified: Secondary | ICD-10-CM | POA: Diagnosis present

## 2024-01-28 DIAGNOSIS — R6510 Systemic inflammatory response syndrome (SIRS) of non-infectious origin without acute organ dysfunction: Secondary | ICD-10-CM | POA: Diagnosis present

## 2024-01-28 DIAGNOSIS — J9611 Chronic respiratory failure with hypoxia: Secondary | ICD-10-CM | POA: Diagnosis present

## 2024-01-28 DIAGNOSIS — R251 Tremor, unspecified: Secondary | ICD-10-CM | POA: Diagnosis present

## 2024-01-28 DIAGNOSIS — E1169 Type 2 diabetes mellitus with other specified complication: Secondary | ICD-10-CM | POA: Diagnosis not present

## 2024-01-28 DIAGNOSIS — E872 Acidosis, unspecified: Secondary | ICD-10-CM | POA: Diagnosis present

## 2024-01-28 DIAGNOSIS — Z66 Do not resuscitate: Secondary | ICD-10-CM | POA: Diagnosis present

## 2024-01-28 DIAGNOSIS — E1165 Type 2 diabetes mellitus with hyperglycemia: Secondary | ICD-10-CM | POA: Diagnosis present

## 2024-01-28 DIAGNOSIS — R2689 Other abnormalities of gait and mobility: Secondary | ICD-10-CM | POA: Diagnosis present

## 2024-01-28 DIAGNOSIS — D75839 Thrombocytosis, unspecified: Secondary | ICD-10-CM | POA: Diagnosis not present

## 2024-01-28 DIAGNOSIS — E039 Hypothyroidism, unspecified: Secondary | ICD-10-CM | POA: Diagnosis not present

## 2024-01-28 DIAGNOSIS — N189 Chronic kidney disease, unspecified: Secondary | ICD-10-CM

## 2024-01-28 DIAGNOSIS — J9622 Acute and chronic respiratory failure with hypercapnia: Secondary | ICD-10-CM

## 2024-01-28 DIAGNOSIS — Z9889 Other specified postprocedural states: Secondary | ICD-10-CM

## 2024-01-28 DIAGNOSIS — J449 Chronic obstructive pulmonary disease, unspecified: Secondary | ICD-10-CM | POA: Diagnosis present

## 2024-01-28 DIAGNOSIS — Z9049 Acquired absence of other specified parts of digestive tract: Secondary | ICD-10-CM

## 2024-01-28 DIAGNOSIS — Z809 Family history of malignant neoplasm, unspecified: Secondary | ICD-10-CM

## 2024-01-28 DIAGNOSIS — I4510 Unspecified right bundle-branch block: Secondary | ICD-10-CM | POA: Diagnosis present

## 2024-01-28 DIAGNOSIS — E870 Hyperosmolality and hypernatremia: Secondary | ICD-10-CM | POA: Diagnosis not present

## 2024-01-28 DIAGNOSIS — J9612 Chronic respiratory failure with hypercapnia: Secondary | ICD-10-CM | POA: Diagnosis present

## 2024-01-28 DIAGNOSIS — D649 Anemia, unspecified: Secondary | ICD-10-CM | POA: Diagnosis present

## 2024-01-28 DIAGNOSIS — Z8249 Family history of ischemic heart disease and other diseases of the circulatory system: Secondary | ICD-10-CM

## 2024-01-28 DIAGNOSIS — J441 Chronic obstructive pulmonary disease with (acute) exacerbation: Secondary | ICD-10-CM | POA: Diagnosis not present

## 2024-01-28 DIAGNOSIS — Z823 Family history of stroke: Secondary | ICD-10-CM

## 2024-01-28 DIAGNOSIS — I2510 Atherosclerotic heart disease of native coronary artery without angina pectoris: Secondary | ICD-10-CM | POA: Diagnosis present

## 2024-01-28 DIAGNOSIS — Z888 Allergy status to other drugs, medicaments and biological substances status: Secondary | ICD-10-CM

## 2024-01-28 DIAGNOSIS — Z1152 Encounter for screening for COVID-19: Secondary | ICD-10-CM

## 2024-01-28 DIAGNOSIS — Z79899 Other long term (current) drug therapy: Secondary | ICD-10-CM

## 2024-01-28 DIAGNOSIS — E119 Type 2 diabetes mellitus without complications: Secondary | ICD-10-CM

## 2024-01-28 DIAGNOSIS — D72829 Elevated white blood cell count, unspecified: Secondary | ICD-10-CM | POA: Diagnosis present

## 2024-01-28 DIAGNOSIS — J9601 Acute respiratory failure with hypoxia: Secondary | ICD-10-CM

## 2024-01-28 DIAGNOSIS — Z7984 Long term (current) use of oral hypoglycemic drugs: Secondary | ICD-10-CM

## 2024-01-28 DIAGNOSIS — E860 Dehydration: Secondary | ICD-10-CM | POA: Diagnosis present

## 2024-01-28 DIAGNOSIS — Z882 Allergy status to sulfonamides status: Secondary | ICD-10-CM

## 2024-01-28 DIAGNOSIS — R053 Chronic cough: Secondary | ICD-10-CM

## 2024-01-28 DIAGNOSIS — I129 Hypertensive chronic kidney disease with stage 1 through stage 4 chronic kidney disease, or unspecified chronic kidney disease: Secondary | ICD-10-CM | POA: Diagnosis present

## 2024-01-28 DIAGNOSIS — N1831 Chronic kidney disease, stage 3a: Secondary | ICD-10-CM | POA: Diagnosis present

## 2024-01-28 DIAGNOSIS — D631 Anemia in chronic kidney disease: Secondary | ICD-10-CM | POA: Diagnosis present

## 2024-01-28 DIAGNOSIS — Z83438 Family history of other disorder of lipoprotein metabolism and other lipidemia: Secondary | ICD-10-CM

## 2024-01-28 DIAGNOSIS — F39 Unspecified mood [affective] disorder: Secondary | ICD-10-CM | POA: Diagnosis present

## 2024-01-28 DIAGNOSIS — Z833 Family history of diabetes mellitus: Secondary | ICD-10-CM

## 2024-01-28 DIAGNOSIS — T380X5A Adverse effect of glucocorticoids and synthetic analogues, initial encounter: Secondary | ICD-10-CM | POA: Diagnosis present

## 2024-01-28 LAB — CBC WITH DIFFERENTIAL/PLATELET
Abs Immature Granulocytes: 0.07 K/uL (ref 0.00–0.07)
Basophils Absolute: 0.1 K/uL (ref 0.0–0.1)
Basophils Relative: 1 %
Eosinophils Absolute: 0.3 K/uL (ref 0.0–0.5)
Eosinophils Relative: 2 %
HCT: 32 % — ABNORMAL LOW (ref 36.0–46.0)
Hemoglobin: 9.8 g/dL — ABNORMAL LOW (ref 12.0–15.0)
Immature Granulocytes: 1 %
Lymphocytes Relative: 9 %
Lymphs Abs: 1.3 K/uL (ref 0.7–4.0)
MCH: 29.6 pg (ref 26.0–34.0)
MCHC: 30.6 g/dL (ref 30.0–36.0)
MCV: 96.7 fL (ref 80.0–100.0)
Monocytes Absolute: 0.7 K/uL (ref 0.1–1.0)
Monocytes Relative: 5 %
Neutro Abs: 11.4 K/uL — ABNORMAL HIGH (ref 1.7–7.7)
Neutrophils Relative %: 82 %
Platelets: 471 K/uL — ABNORMAL HIGH (ref 150–400)
RBC: 3.31 MIL/uL — ABNORMAL LOW (ref 3.87–5.11)
RDW: 13.5 % (ref 11.5–15.5)
WBC: 13.7 K/uL — ABNORMAL HIGH (ref 4.0–10.5)
nRBC: 0 % (ref 0.0–0.2)

## 2024-01-28 LAB — BLOOD GAS, VENOUS
Acid-Base Excess: 13.7 mmol/L — ABNORMAL HIGH (ref 0.0–2.0)
Bicarbonate: 40.8 mmol/L — ABNORMAL HIGH (ref 20.0–28.0)
O2 Saturation: 66.3 %
Patient temperature: 37
pCO2, Ven: 69 mmHg — ABNORMAL HIGH (ref 44–60)
pH, Ven: 7.38 (ref 7.25–7.43)
pO2, Ven: 37 mmHg (ref 32–45)

## 2024-01-28 LAB — COMPREHENSIVE METABOLIC PANEL WITH GFR
ALT: 13 U/L (ref 0–44)
AST: 23 U/L (ref 15–41)
Albumin: 3.8 g/dL (ref 3.5–5.0)
Alkaline Phosphatase: 76 U/L (ref 38–126)
Anion gap: 10 (ref 5–15)
BUN: 17 mg/dL (ref 8–23)
CO2: 38 mmol/L — ABNORMAL HIGH (ref 22–32)
Calcium: 9.5 mg/dL (ref 8.9–10.3)
Chloride: 98 mmol/L (ref 98–111)
Creatinine, Ser: 1.64 mg/dL — ABNORMAL HIGH (ref 0.44–1.00)
GFR, Estimated: 31 mL/min — ABNORMAL LOW
Glucose, Bld: 290 mg/dL — ABNORMAL HIGH (ref 70–99)
Potassium: 4 mmol/L (ref 3.5–5.1)
Sodium: 145 mmol/L (ref 135–145)
Total Bilirubin: 0.3 mg/dL (ref 0.0–1.2)
Total Protein: 7 g/dL (ref 6.5–8.1)

## 2024-01-28 LAB — RESP PANEL BY RT-PCR (RSV, FLU A&B, COVID)  RVPGX2
Influenza A by PCR: NEGATIVE
Influenza B by PCR: NEGATIVE
Resp Syncytial Virus by PCR: NEGATIVE
SARS Coronavirus 2 by RT PCR: NEGATIVE

## 2024-01-28 LAB — RESPIRATORY PANEL BY PCR

## 2024-01-28 LAB — I-STAT CG4 LACTIC ACID, ED
Lactic Acid, Venous: 2.7 mmol/L (ref 0.5–1.9)
Lactic Acid, Venous: 2.9 mmol/L (ref 0.5–1.9)

## 2024-01-28 LAB — URINALYSIS, ROUTINE W REFLEX MICROSCOPIC
Bilirubin Urine: NEGATIVE
Glucose, UA: NEGATIVE mg/dL
Hgb urine dipstick: NEGATIVE
Ketones, ur: NEGATIVE mg/dL
Leukocytes,Ua: NEGATIVE
Nitrite: NEGATIVE
Protein, ur: NEGATIVE mg/dL
Specific Gravity, Urine: 1.006 (ref 1.005–1.030)
pH: 6 (ref 5.0–8.0)

## 2024-01-28 LAB — PROTIME-INR
INR: 1 (ref 0.8–1.2)
Prothrombin Time: 13.4 s (ref 11.4–15.2)

## 2024-01-28 LAB — PROCALCITONIN: Procalcitonin: 0.19 ng/mL

## 2024-01-28 LAB — TSH: TSH: 12.8 u[IU]/mL — ABNORMAL HIGH (ref 0.350–4.500)

## 2024-01-28 MED ORDER — LORAZEPAM 2 MG/ML IJ SOLN
1.0000 mg | Freq: Once | INTRAMUSCULAR | Status: AC
  Administered 2024-01-28: 1 mg via INTRAVENOUS
  Filled 2024-01-28: qty 1

## 2024-01-28 MED ORDER — ACETAMINOPHEN 325 MG PO TABS
650.0000 mg | ORAL_TABLET | Freq: Four times a day (QID) | ORAL | Status: DC | PRN
  Administered 2024-01-29 – 2024-01-31 (×2): 650 mg via ORAL
  Filled 2024-01-28 (×2): qty 2

## 2024-01-28 MED ORDER — ALBUTEROL SULFATE (2.5 MG/3ML) 0.083% IN NEBU
INHALATION_SOLUTION | RESPIRATORY_TRACT | Status: AC
  Administered 2024-01-28: 10 mg via RESPIRATORY_TRACT
  Filled 2024-01-28: qty 12

## 2024-01-28 MED ORDER — SODIUM CHLORIDE 0.9 % IV BOLUS
1000.0000 mL | Freq: Once | INTRAVENOUS | Status: AC
  Administered 2024-01-28: 1000 mL via INTRAVENOUS

## 2024-01-28 MED ORDER — ENOXAPARIN SODIUM 40 MG/0.4ML IJ SOSY: 40.0000 mg | PREFILLED_SYRINGE | Freq: Every day | INTRAMUSCULAR | Status: DC

## 2024-01-28 MED ORDER — SODIUM CHLORIDE 0.9 % IV SOLN
100.0000 mg | Freq: Once | INTRAVENOUS | Status: AC
  Administered 2024-01-28: 100 mg via INTRAVENOUS
  Filled 2024-01-28: qty 100

## 2024-01-28 MED ORDER — LACTATED RINGERS IV BOLUS
1000.0000 mL | Freq: Once | INTRAVENOUS | Status: AC
  Administered 2024-01-28: 1000 mL via INTRAVENOUS

## 2024-01-28 MED ORDER — PREDNISONE 20 MG PO TABS: 40.0000 mg | ORAL_TABLET | Freq: Every day | ORAL | Status: DC

## 2024-01-28 MED ORDER — SODIUM CHLORIDE 0.9 % IV SOLN
100.0000 mg | Freq: Two times a day (BID) | INTRAVENOUS | Status: DC
  Administered 2024-01-29: 100 mg via INTRAVENOUS
  Filled 2024-01-28 (×2): qty 100

## 2024-01-28 MED ORDER — BUDESONIDE 0.5 MG/2ML IN SUSP
0.5000 mg | Freq: Two times a day (BID) | RESPIRATORY_TRACT | Status: DC
  Administered 2024-01-28 – 2024-02-01 (×8): 0.5 mg via RESPIRATORY_TRACT
  Filled 2024-01-28 (×8): qty 2

## 2024-01-28 MED ORDER — IPRATROPIUM-ALBUTEROL 0.5-2.5 (3) MG/3ML IN SOLN
3.0000 mL | RESPIRATORY_TRACT | Status: DC
  Administered 2024-01-28 – 2024-01-30 (×13): 3 mL via RESPIRATORY_TRACT
  Filled 2024-01-28 (×12): qty 3

## 2024-01-28 MED ORDER — SODIUM CHLORIDE 0.9 % IV SOLN
2.0000 g | INTRAVENOUS | Status: DC
  Administered 2024-01-29 – 2024-01-30 (×2): 2 g via INTRAVENOUS
  Filled 2024-01-28 (×2): qty 20

## 2024-01-28 MED ORDER — METOPROLOL TARTRATE 5 MG/5ML IV SOLN
2.5000 mg | Freq: Once | INTRAVENOUS | Status: AC
  Administered 2024-01-28: 2.5 mg via INTRAVENOUS
  Filled 2024-01-28: qty 5

## 2024-01-28 MED ORDER — ARFORMOTEROL TARTRATE 15 MCG/2ML IN NEBU
15.0000 ug | INHALATION_SOLUTION | Freq: Two times a day (BID) | RESPIRATORY_TRACT | Status: DC
  Administered 2024-01-28 – 2024-02-01 (×8): 15 ug via RESPIRATORY_TRACT
  Filled 2024-01-28 (×8): qty 2

## 2024-01-28 MED ORDER — DILTIAZEM HCL 25 MG/5ML IV SOLN
10.0000 mg | Freq: Once | INTRAVENOUS | Status: AC
  Administered 2024-01-28: 10 mg via INTRAVENOUS
  Filled 2024-01-28: qty 5

## 2024-01-28 MED ORDER — SODIUM CHLORIDE 0.9% FLUSH
3.0000 mL | Freq: Two times a day (BID) | INTRAVENOUS | Status: DC
  Administered 2024-01-28 – 2024-02-01 (×7): 3 mL via INTRAVENOUS

## 2024-01-28 MED ORDER — ACETAMINOPHEN 650 MG RE SUPP: 650.0000 mg | Freq: Four times a day (QID) | RECTAL | Status: DC | PRN

## 2024-01-28 MED ORDER — ALBUTEROL SULFATE (2.5 MG/3ML) 0.083% IN NEBU: 2.5000 mg | INHALATION_SOLUTION | RESPIRATORY_TRACT | Status: DC | PRN

## 2024-01-28 MED ORDER — ALBUTEROL SULFATE (2.5 MG/3ML) 0.083% IN NEBU
10.0000 mg/h | INHALATION_SOLUTION | RESPIRATORY_TRACT | Status: DC
  Administered 2024-01-28: 10 mg/h via RESPIRATORY_TRACT
  Filled 2024-01-28: qty 12

## 2024-01-28 MED ORDER — ENOXAPARIN SODIUM 30 MG/0.3ML IJ SOSY
30.0000 mg | PREFILLED_SYRINGE | Freq: Every day | INTRAMUSCULAR | Status: DC
  Administered 2024-01-28 – 2024-01-31 (×4): 30 mg via SUBCUTANEOUS
  Filled 2024-01-28 (×4): qty 0.3

## 2024-01-28 MED ORDER — SODIUM CHLORIDE 0.9 % IV SOLN
1.0000 g | Freq: Once | INTRAVENOUS | Status: AC
  Administered 2024-01-28: 1 g via INTRAVENOUS
  Filled 2024-01-28: qty 10

## 2024-01-28 NOTE — Progress Notes (Signed)
" °   01/28/24 2126  BiPAP/CPAP/SIPAP  BiPAP/CPAP/SIPAP Pt Type Adult  BiPAP/CPAP/SIPAP SERVO  Mask Type Full face mask  Dentures removed? Not applicable  Mask Size Small  Set Rate 10 breaths/min  Respiratory Rate 33 breaths/min  IPAP 16 cmH20  EPAP 6 cmH2O  Pressure Support 10 cmH20  FiO2 (%) 30 %  Minute Ventilation 11.3  Peak Inspiratory Pressure (PIP) 18  Tidal Volume (Vt) 385  Patient Home Machine No  Patient Home Mask No  Patient Home Tubing No  Auto Titrate No  Press High Alarm 30 cmH2O  Device Plugged into RED Power Outlet Yes    "

## 2024-01-28 NOTE — ED Provider Notes (Signed)
 Signout from Dr. Mannie.  79 year old female with shortness of breath for 2 weeks.  Baseline on 4 L nasal cannula.  Given steroids breathing treatments antibiotics.  Has elevated lactate.  Chest x-ray does not show any acute infiltrate.  Currently on BiPAP.  Awaiting callback from hospitalist. Physical Exam  BP 137/65   Pulse (!) 141   Temp 98.1 F (36.7 C) (Axillary)   Resp (!) 29   Ht 5' (1.524 m)   Wt 53.6 kg   SpO2 100%   BMI 23.08 kg/m   Physical Exam  Procedures  Procedures  ED Course / MDM    Medical Decision Making Amount and/or Complexity of Data Reviewed Labs: ordered. Radiology: ordered.  Risk Prescription drug management.   Discussed with Dr. Claudene [facility] hospitalist will evaluate patient for admission.       Towana Ozell BROCKS, MD 01/29/24 [phone]

## 2024-01-28 NOTE — H&P (Incomplete)
 " History and Physical    Patient: [name] [name] FMW:[id number] DOB: 02/07/1945 DOA: 01/28/2024 DOS: the patient was seen and examined on 01/28/2024 PCP: Chrystal Lamarr RAMAN, MD  Patient coming from: {Point_of_Origin:[address]}  Chief Complaint:  Chief Complaint  Patient presents with   Shortness of Breath   HPI: [name] [name] is a 79 y.o. female with medical history significant of COPD, chronic respiratory failure on 3 - 4 L, hypertension, hyperlipidemia, CAD, diabetes mellitus type 2, hypothyroidism,  En route with EMS patient was noted to have O2 saturations listless 80% on 2 L nasal cannula oxygen .  Patient was given 2 DuoNeb breathing treatments, Solu-Medrol  125 mg IV, and magnesium  sulfate 2 g IV.  Upon admission patient was noted to be afebrile with pulse 137-151, respirations 29-43, blood pressures 122/59-137/65, and O2 saturations maintained on room air.  Labs significant for WBC 13.7, hemoglobin 9.8, platelets 471 BUN 17, creatinine 0.64, CO2 38, glucose 290, and lactic acid 2.7.  Chest x-ray showed no acute abnormality.  Blood cultures were obtained.  Patient had been given continuous albuterol  neb, DuoNeb breathing treatment, Cardizem  10 mg IV, 2 L of lactated Ringer 's, Rocephin , and doxycycline .  Review of Systems: {ROS_Text:26778} Past Medical History:  Diagnosis Date   Allergic rhinitis, cause unspecified    CAD in native artery 07/25/2017   Chronic airway obstruction, not elsewhere classified    COPD (chronic obstructive pulmonary disease) (HCC)    Depressive disorder, not elsewhere classified    Nephritis and nephropathy, not specified as acute or chronic, with unspecified pathological lesion in kidney    Obesity, unspecified    Other and unspecified hyperlipidemia    Oxygen  deficiency    Thyrotoxicosis without mention of goiter or other cause, without mention of thyrotoxic crisis or storm    Unspecified essential hypertension    UTI (urinary tract  infection) 2019 dec   Past Surgical History:  Procedure Laterality Date   BIOPSY  01/19/2021   Procedure: BIOPSY;  Surgeon: San Sandor GAILS, DO;  Location: [facility];  Service: Gastroenterology;;   CHOLECYSTECTOMY     ESOPHAGOGASTRODUODENOSCOPY (EGD) WITH PROPOFOL  N/A 01/19/2021   Procedure: ESOPHAGOGASTRODUODENOSCOPY (EGD) WITH PROPOFOL ;  Surgeon: San Sandor GAILS, DO;  Location: [facility];  Service: Gastroenterology;  Laterality: N/A;   LEFT HEART CATH AND CORONARY ANGIOGRAPHY N/A 03/01/2017   Procedure: LEFT HEART CATH AND CORONARY ANGIOGRAPHY;  Surgeon: Mady Bruckner, MD;  Location: [facility];  Service: Cardiovascular;  Laterality: N/A;   Social History:  reports that she quit smoking about 30 years ago. Her smoking use included cigarettes. She started smoking about 64 years ago. She has a 68 pack-year smoking history. She has never used smokeless tobacco. She reports that she does not drink alcohol and does not use drugs.  Allergies[1]  Family History  Problem Relation Age of Onset   Heart failure Mother    Hyperlipidemia Mother    Hypertension Mother    Diabetes Mother    CAD Mother    Cancer Brother    Stroke Maternal Grandfather    Diabetes Brother     Prior to Admission medications  Medication Sig Start Date End Date Taking? Authorizing Provider  albuterol  (PROVENTIL ) (2.5 MG/3ML) 0.083% nebulizer solution TAKE 3 ML (2.5 MG TOTAL) BY NEBULIZATION EVERY 4 HOURS AS NEEDED FOR WHEEZING OR SHORTNESS OF BREATH 08/20/23   Shelah Lamar RAMAN, MD  albuterol  (VENTOLIN  HFA) 108 (90 Base) MCG/ACT inhaler Inhale 2 puffs into the lungs every 4 (four) hours as  needed for wheezing or shortness of breath. 08/20/23   Byrum, Robert S, MD  Ascorbic Acid (VITAMIN C) 1000 MG tablet Take 1,000 mg by mouth daily.    [provider]  atorvastatin  (LIPITOR) 80 MG tablet TAKE 1 TABLET BY MOUTH EVERY DAY 08/21/23   Raford Riggs, MD  Budeson-Glycopyrrol-Formoterol  (BREZTRI   AEROSPHERE) 160-9-4.8 MCG/ACT AERO Inhale 2 puffs into the lungs in the morning and at bedtime. 10/03/22   Shelah Lamar RAMAN, MD  Calcium  Carb-Cholecalciferol (CALCIUM  600+D3 PO) Take 1 tablet by mouth daily.    [provider]  Cholecalciferol (VITAMIN D-3 PO) Take 1 tablet by mouth daily.    [provider]  esomeprazole  (NEXIUM ) 40 MG capsule TAKE 1 CAPSULE BY MOUTH 2 TIMES DAILY BEFORE A MEAL. 04/09/23   Byrum, Robert S, MD  ferrous sulfate  325 (65 FE) MG tablet Take 650 mg by mouth daily. 10/19/18   [provider]  fluticasone  (FLONASE ) 50 MCG/ACT nasal spray Place 1 spray into both nostrils in the morning and at bedtime. 12/21/22   Cheryle Page, MD  furosemide  (LASIX ) 20 MG tablet Take 1 tablet (20 mg total) by mouth as needed. Pt must schedule an overdue followup appt with Cardiology for any more refills. [phone] 3rd and FINAL attempt Thank You 12/17/23   Raford Riggs, MD  levocetirizine (XYZAL) 5 MG tablet Take 5 mg by mouth daily.    [provider]  levothyroxine  (SYNTHROID ) 75 MCG tablet Take 1 tablet by mouth daily.    [provider]  metFORMIN  (GLUCOPHAGE -XR) 500 MG 24 hr tablet Take 500 mg by mouth every evening. 12/20/18   [provider]  metoprolol  tartrate (LOPRESSOR ) 50 MG tablet TAKE 1 TABLET BY MOUTH TWICE A DAY ***NEED APPT FOR REFILLS. 2ND ATTEMPT. 01/09/24   Raford Riggs, MD  Multiple Vitamins-Minerals (CENTRUM PO) Take 1 tablet by mouth daily.    [provider]  nitroGLYCERIN  (NITROSTAT ) 0.4 MG SL tablet Place 1 tablet (0.4 mg total) under the tongue every 5 (five) minutes as needed for chest pain. 02/21/17 11/09/23  Kilroy, Luke K, PA-C  predniSONE  (DELTASONE ) 10 MG tablet Take 1 tablet (10 mg total) by mouth daily with breakfast. 01/23/24   Hope Almarie ORN, NP  roflumilast  (DALIRESP ) 500 MCG TABS tablet Take 1 tablet (500 mcg total) by mouth daily. 03/05/23   Hope Almarie ORN, NP  traZODone   (DESYREL ) 50 MG tablet Take 50 mg by mouth at bedtime.  02/28/13   [provider]  venlafaxine  XR (EFFEXOR -XR) 150 MG 24 hr capsule Take 150 mg by mouth daily.  07/03/17   [provider]    Physical Exam: Vitals:   01/28/24 1607 01/28/24 1652 01/28/24 1700 01/28/24 1721  BP:   137/65 (!) 122/59  Pulse:  (!) 151 (!) 141 (!) 137  Resp:  (!) 40 (!) 29 (!) 43  Temp:      TempSrc:      SpO2: 95% 98% 100% 100%  Weight:      Height:       *** Data Reviewed: {Tip this will not be part of the note when signed- Document your independent interpretation of telemetry tracing, EKG, lab, Radiology test or any other diagnostic tests. Add any new diagnostic test ordered today. (Optional):26781} Sinus tachycardia 143 bpm with a right bundle branch block with significant artifact noted in lead III.  Assessment and Plan:  Acute on chronic respiratory failure with hypoxia SIRS Chest x-ray has showed no acute abnormality.  Influenza, COVID-19,  and RSV screening were negative.     - Admit to progressive bed - Continuous pulse oximetry with oxygen  to maintain O2 saturations greater than 88-90% - Check procalcitonin - Check complete respiratory virus panel -    Acute kidney injury superimposed on chronic kidney disease stage IIIa Labs revealed creatinine elevated up to 1.64 with BUN 17.  Baseline creatinine previously noted to be around 1-1.2.  Had been bolused 2 L of IV fluids. - Avoid possible nephrotoxic agents -     Diabetes mellitus type II with hyperglycemia Last available hemoglobin A1c on file was 6.8 back in 12/2022.   Advance Care Planning:   Code Status: Prior ***  Consults: ***  Family Communication: ***  Severity of Illness: {Observation/Inpatient:21159}  Author: Maximino DELENA Sharps, MD 01/28/2024 5:50 PM  For on call review [email or web address].     [1]  Allergies Allergen Reactions   Lisinopril Cough    cough   Sulfonamide Derivatives Rash   "

## 2024-01-28 NOTE — ED Triage Notes (Signed)
 Pt via ems reports 2 weeks, increasing sob, lethargy, weakness, worse today.  EMS called to home.   Wheezy, hx of copd Prod cough, sputum yellow Afebrile, pt has had chills Pt on 4 L [place] at home Spo02 were 80s on 2 L [place] w ems.    136/78 Hr 130 35 rr  Cbg 300 Pt is diabetic R and L 18 G IV placed per EMS 2 duo nebs 10 albuterol , 1 atrovent  125 solumedol  2 gm Magnesium 

## 2024-01-28 NOTE — ED Provider Notes (Signed)
 " [place] EMERGENCY DEPARTMENT AT [facility] Provider Note  CSN: [id number] Arrival date & time: 01/28/24 1431  Chief Complaint(s) Shortness of Breath  HPI [name] [name] is a 79 y.o. female who is here today for shortness of breath.  Patient Dors symptoms are been ongoing for about 2 weeks.  She felt as though she has had increased sputum production.  She felt as though she has had chills.  She is on 4 L nasal cannula at baseline.  EMS provided her with steroids, DuoNebs.   Past Medical History Past Medical History:  Diagnosis Date   Allergic rhinitis, cause unspecified    CAD in native artery 07/25/2017   Chronic airway obstruction, not elsewhere classified    COPD (chronic obstructive pulmonary disease) (HCC)    Depressive disorder, not elsewhere classified    Nephritis and nephropathy, not specified as acute or chronic, with unspecified pathological lesion in kidney    Obesity, unspecified    Other and unspecified hyperlipidemia    Oxygen  deficiency    Thyrotoxicosis without mention of goiter or other cause, without mention of thyrotoxic crisis or storm    Unspecified essential hypertension    UTI (urinary tract infection) 2019 dec   Patient Active Problem List   Diagnosis Date Noted   Normocytic anemia 12/19/2022   Hypothyroidism 12/19/2022   Pedal edema 04/25/2022   Gastritis and gastroduodenitis    RUQ pain    Dysphagia 11/04/2020   Atypical pneumonia 05/04/2020   Acute on chronic respiratory failure with hypoxia (HCC) 05/04/2020   Shortness of breath 02/24/2020   History of pulmonary embolus (PE) 02/24/2020   Abnormal findings on diagnostic imaging of lung 12/11/2019   Healthcare maintenance 11/18/2019   Acute non-recurrent ethmoidal sinusitis 11/18/2019   Close exposure to COVID-19 virus 01/01/2019   Chronic respiratory failure with hypoxia (HCC) 07/30/2018   Iron deficiency anemia 01/14/2018   Pulmonary embolism (HCC) 07/26/2017   CAD in  native artery 07/25/2017   Abnormal cardiac CT angiography 02/21/2017   Chest pain with high risk of acute coronary syndrome 02/21/2017   Type 2 diabetes mellitus (HCC) 02/21/2017   GERD (gastroesophageal reflux disease) 12/05/2016   Vocal cord dysfunction 11/06/2014   Cough 06/04/2013   Thyrotoxicosis 10/31/2006   Dyslipidemia 10/31/2006   OBESITY 10/31/2006   DEPRESSION 10/31/2006   Essential hypertension 10/31/2006   Allergic rhinitis 10/31/2006   COPD (chronic obstructive pulmonary disease) (HCC) 10/31/2006   GLOMERULONEPHRITIS 10/31/2006   Home Medication(s)                                                                                                                                   Past Surgical History Past Surgical History:  Procedure Laterality Date   BIOPSY  01/19/2021   Procedure: BIOPSY;  Surgeon: San Sandor GAILS, DO;  Location: [facility];  Service: Gastroenterology;;   CHOLECYSTECTOMY     ESOPHAGOGASTRODUODENOSCOPY (EGD) WITH PROPOFOL  N/A  01/19/2021   Procedure: ESOPHAGOGASTRODUODENOSCOPY (EGD) WITH PROPOFOL ;  Surgeon: San Sandor GAILS, DO;  Location: [facility];  Service: Gastroenterology;  Laterality: N/A;   LEFT HEART CATH AND CORONARY ANGIOGRAPHY N/A 03/01/2017   Procedure: LEFT HEART CATH AND CORONARY ANGIOGRAPHY;  Surgeon: Mady Bruckner, MD;  Location: [facility];  Service: Cardiovascular;  Laterality: N/A;   Family History Family History  Problem Relation Age of Onset   Heart failure Mother    Hyperlipidemia Mother    Hypertension Mother    Diabetes Mother    CAD Mother    Cancer Brother    Stroke Maternal Grandfather    Diabetes Brother     Social History Social History[1] Allergies Lisinopril and Sulfonamide derivatives  Review of Systems Review of Systems  Physical Exam Vital Signs  I have reviewed the triage vital signs Pulse (!) 147   Temp 98.1 F (36.7 C) (Axillary)   Resp (!) 32   Ht 5' (1.524 m)   Wt 53.6 kg    SpO2 100%   BMI 23.08 kg/m   Physical Exam Vitals and nursing note reviewed.  Constitutional:      Appearance: She is ill-appearing.  Cardiovascular:     Rate and Rhythm: Regular rhythm. Tachycardia present.  Pulmonary:     Effort: Respiratory distress present.     Breath sounds: Wheezing present. No decreased breath sounds.  Abdominal:     Palpations: Abdomen is soft.  Musculoskeletal:     Right lower leg: No edema.     Left lower leg: No edema.  Skin:    General: Skin is warm.  Neurological:     Mental Status: She is alert.     ED Results and Treatments Labs (all labs ordered are listed, but only abnormal results are displayed) Labs Reviewed  COMPREHENSIVE METABOLIC PANEL WITH GFR - Abnormal; Notable for the following components:      Result Value   CO2 38 (*)    Glucose, Bld 290 (*)    Creatinine, Ser 1.64 (*)    GFR, Estimated 31 (*)    All other components within normal limits  CBC WITH DIFFERENTIAL/PLATELET - Abnormal; Notable for the following components:   WBC 13.7 (*)    RBC 3.31 (*)    Hemoglobin 9.8 (*)    HCT 32.0 (*)    Platelets 471 (*)    Neutro Abs 11.4 (*)    All other components within normal limits  I-STAT CG4 LACTIC ACID, ED - Abnormal; Notable for the following components:   Lactic Acid, Venous 2.7 (*)    All other components within normal limits  CULTURE, BLOOD (ROUTINE X 2)  CULTURE, BLOOD (ROUTINE X 2)  RESP PANEL BY RT-PCR (RSV, FLU A&B, COVID)  RVPGX2  PROTIME-INR  TSH                                                                                                                          Radiology No  results found.  Pertinent labs & imaging results that were available during my care of the patient were reviewed by me and considered in my medical decision making (see MDM for details).  Medications Ordered in ED Medications  albuterol  (PROVENTIL ) (2.5 MG/3ML) 0.083% nebulizer solution (0 mg/hr Nebulization Stopped 01/28/24 1555)   cefTRIAXone  (ROCEPHIN ) 1 g in sodium chloride  0.9 % 100 mL IVPB (has no administration in time range)  doxycycline  (VIBRAMYCIN ) 100 mg in sodium chloride  0.9 % 250 mL IVPB (has no administration in time range)  sodium chloride  0.9 % bolus 1,000 mL (has no administration in time range)  lactated ringers  bolus 1,000 mL (1,000 mLs Intravenous New Bag/Given 01/28/24 1530)                                                                                                                                     Procedures .Critical Care  Performed by: Mannie Fairy DASEN, DO Authorized by: Mannie Fairy DASEN, DO   Critical care provider statement:    Critical care time (minutes):  35   Critical care was necessary to treat or prevent imminent or life-threatening deterioration of the following conditions:  Respiratory failure   Critical care was time spent personally by me on the following activities:  Development of treatment plan with patient or surrogate, discussions with consultants, evaluation of patient's response to treatment, examination of patient, ordering and review of laboratory studies, ordering and review of radiographic studies, ordering and performing treatments and interventions, pulse oximetry, re-evaluation of patient's condition and review of old charts   (including critical care time)  Medical Decision Making / ED Course   This patient presents to the ED for concern of shortness of breath, this involves an extensive number of treatment options, and is a complaint that carries with it a high risk of complications and morbidity.  The differential diagnosis includes COPD exacerbation, pneumonia, less likely PE, less likely heart failure, less likely ACS.  MDM: Patient respiratory distress on arrival, placed on BiPAP, continuous albuterol  ordered.  Perform bedside ultrasound which did not show any pulmonary edema on the patient.  Patient did not wish to continue to wear her BiPAP mask, is  currently on her baseline 4 L saturating 93% but is moderately tachypneic.  She is going to continue receiving continuous albuterol .  Patient with a mildly elevated lactic acid, leukocytosis 13.  She meets sepsis criteria, started on broad-spectrum antibiotics.  Out of concern for fluid overload, will provide the patient with 2 L of fluid and reassess.   Additional history obtained: -Additional history obtained from EMS -External records from outside source obtained and reviewed including: Chart review including previous notes, labs, imaging, consultation notes   Lab Tests: -I ordered, reviewed, and interpreted labs.   The pertinent results include:   Labs Reviewed  COMPREHENSIVE METABOLIC PANEL WITH GFR - Abnormal; Notable for the following components:      Result  Value   CO2 38 (*)    Glucose, Bld 290 (*)    Creatinine, Ser 1.64 (*)    GFR, Estimated 31 (*)    All other components within normal limits  CBC WITH DIFFERENTIAL/PLATELET - Abnormal; Notable for the following components:   WBC 13.7 (*)    RBC 3.31 (*)    Hemoglobin 9.8 (*)    HCT 32.0 (*)    Platelets 471 (*)    Neutro Abs 11.4 (*)    All other components within normal limits  I-STAT CG4 LACTIC ACID, ED - Abnormal; Notable for the following components:   Lactic Acid, Venous 2.7 (*)    All other components within normal limits  CULTURE, BLOOD (ROUTINE X 2)  CULTURE, BLOOD (ROUTINE X 2)  RESP PANEL BY RT-PCR (RSV, FLU A&B, COVID)  RVPGX2  PROTIME-INR  TSH      EKG sinus tachycardia  EKG Interpretation Date/Time:  Monday January 28 2024 14:55:05 EST Ventricular Rate:  143 PR Interval:    QRS Duration:  117 QT Interval:  292 QTC Calculation: 451 R Axis:   214  Text Interpretation: Sinus tachycardia Right bundle branch block Low voltage, precordial leads Artifact in lead(s) III Confirmed by Mannie Pac [phone]) on 01/28/2024 3:16:22 PM         Imaging Studies ordered: I ordered imaging studies  including x-ray I independently visualized and interpreted imaging. I agree with the radiologist interpretation   Medicines ordered and prescription drug management: Meds ordered this encounter  Medications   albuterol  (PROVENTIL ) (2.5 MG/3ML) 0.083% nebulizer solution   albuterol  (PROVENTIL ) (2.5 MG/3ML) 0.083% nebulizer solution    Estell Sailors W: cabinet override   lactated ringers  bolus 1,000 mL   cefTRIAXone  (ROCEPHIN ) 1 g in sodium chloride  0.9 % 100 mL IVPB    Antibiotic Indication::   CAP   doxycycline  (VIBRAMYCIN ) 100 mg in sodium chloride  0.9 % 250 mL IVPB    Antibiotic Indication::   CAP   sodium chloride  0.9 % bolus 1,000 mL    -I have reviewed the patients home medicines and have made adjustments as needed  Critical interventions Management of acute hypoxic respiratory failure, possible sepsis  Cardiac Monitoring: The patient was maintained on a cardiac monitor.  I personally viewed and interpreted the cardiac monitored which showed an underlying rhythm of: Sinus tachycardia   Reevaluation: After the interventions noted above, I reevaluated the patient and found that they have :improved  Co morbidities that complicate the patient evaluation  Past Medical History:  Diagnosis Date   Allergic rhinitis, cause unspecified    CAD in native artery 07/25/2017   Chronic airway obstruction, not elsewhere classified    COPD (chronic obstructive pulmonary disease) (HCC)    Depressive disorder, not elsewhere classified    Nephritis and nephropathy, not specified as acute or chronic, with unspecified pathological lesion in kidney    Obesity, unspecified    Other and unspecified hyperlipidemia    Oxygen  deficiency    Thyrotoxicosis without mention of goiter or other cause, without mention of thyrotoxic crisis or storm    Unspecified essential hypertension    UTI (urinary tract infection) 2019 dec         Final Clinical Impression(s) / ED Diagnoses Final  diagnoses:  COPD exacerbation (HCC)  Acute hypoxic respiratory failure (HCC)     @PCDICTATION @     [1]  Social History Tobacco Use   Smoking status: Former    Current packs/day: 0.00    Average  packs/day: 2.0 packs/day for 34.0 years (68.0 ttl pk-yrs)    Types: Cigarettes    Start date: 01/10/1960    Quit date: 01/09/1994    Years since quitting: 30.0   Smokeless tobacco: Never  Vaping Use   Vaping status: Never Used  Substance Use Topics   Alcohol use: No   Drug use: Never     Mannie Pac T, DO 01/28/24 1605  "

## 2024-01-28 NOTE — ED Notes (Signed)
 Pt made aware of the UA. Pt has a pure wick in. Family at bedside will inform us  when she voids.

## 2024-01-28 NOTE — H&P (Signed)
 " History and Physical    Patient: [name] [name] FMW:[id number] DOB: 05/08/1945 DOA: 01/28/2024 DOS: the patient was seen and examined on 01/28/2024 PCP: Chrystal Lamarr RAMAN, MD  Patient coming from: Home via EMS  Chief Complaint:  Chief Complaint  Patient presents with   Shortness of Breath   HPI: [name] [name] is a 79 y.o. female with medical history significant of COPD, chronic respiratory failure on 3 - 4 L, hypertension, hyperlipidemia, CAD, diabetes mellitus type 2, and hypothyroidism presents with shortness of breath and wheezing. She is accompanied by her son.  History is somewhat limited from the patient as she is currently on BiPAP.  She has been experiencing shortness of breath and wheezing for the past two weeks, accompanied by some chills.  She has had difficulty expectorating sputum despite frequent coughing. No recent exposure to anyone sick.  She also reports experiencing intermittent tremors over the past few months, with increased frequency in the last week or two.  She has been on levothyroxine  with alternating doses, which might have been adjusted over the last year or six months. She read something suggesting that levothyroxine  might not be good.  En route with EMS patient was noted to have O2 saturations listless 80% on 2 L nasal cannula oxygen .  Patient was given 2 DuoNeb breathing treatments, Solu-Medrol  125 mg IV, and magnesium  sulfate 2 g IV.  Upon admission patient was noted to be afebrile with pulse 137-151, respirations 29-43, blood pressures 122/59-137/65, and O2 saturations maintained on room air.  Labs significant for WBC 13.7, hemoglobin 9.8, platelets 471 BUN 17, creatinine 0.64, CO2 38, glucose 290, and lactic acid 2.7.  Chest x-ray showed no acute abnormality.  Blood cultures were obtained.  Patient had been given continuous albuterol  neb, DuoNeb breathing treatment, Cardizem  10 mg IV, 2 L of lactated Ringer 's, Rocephin , and  doxycycline .  Review of Systems: As mentioned in the history of present illness. All other systems reviewed and are negative. Past Medical History:  Diagnosis Date   Allergic rhinitis, cause unspecified    CAD in native artery 07/25/2017   Chronic airway obstruction, not elsewhere classified    COPD (chronic obstructive pulmonary disease) (HCC)    Depressive disorder, not elsewhere classified    Nephritis and nephropathy, not specified as acute or chronic, with unspecified pathological lesion in kidney    Obesity, unspecified    Other and unspecified hyperlipidemia    Oxygen  deficiency    Thyrotoxicosis without mention of goiter or other cause, without mention of thyrotoxic crisis or storm    Unspecified essential hypertension    UTI (urinary tract infection) 2019 dec   Past Surgical History:  Procedure Laterality Date   BIOPSY  01/19/2021   Procedure: BIOPSY;  Surgeon: San Sandor GAILS, DO;  Location: [facility];  Service: Gastroenterology;;   CHOLECYSTECTOMY     ESOPHAGOGASTRODUODENOSCOPY (EGD) WITH PROPOFOL  N/A 01/19/2021   Procedure: ESOPHAGOGASTRODUODENOSCOPY (EGD) WITH PROPOFOL ;  Surgeon: San Sandor GAILS, DO;  Location: [facility];  Service: Gastroenterology;  Laterality: N/A;   LEFT HEART CATH AND CORONARY ANGIOGRAPHY N/A 03/01/2017   Procedure: LEFT HEART CATH AND CORONARY ANGIOGRAPHY;  Surgeon: Mady Bruckner, MD;  Location: [facility];  Service: Cardiovascular;  Laterality: N/A;   Social History:  reports that she quit smoking about 30 years ago. Her smoking use included cigarettes. She started smoking about 64 years ago. She has a 68 pack-year smoking history. She has never used smokeless tobacco. She reports that she does not  drink alcohol and does not use drugs.  Allergies[1]  Family History  Problem Relation Age of Onset   Heart failure Mother    Hyperlipidemia Mother    Hypertension Mother    Diabetes Mother    CAD Mother    Cancer Brother     Stroke Maternal Grandfather    Diabetes Brother     Prior to Admission medications  Medication Sig Start Date End Date Taking? Authorizing Provider  albuterol  (PROVENTIL ) (2.5 MG/3ML) 0.083% nebulizer solution TAKE 3 ML (2.5 MG TOTAL) BY NEBULIZATION EVERY 4 HOURS AS NEEDED FOR WHEEZING OR SHORTNESS OF BREATH 08/20/23   Shelah Lamar RAMAN, MD  albuterol  (VENTOLIN  HFA) 108 (90 Base) MCG/ACT inhaler Inhale 2 puffs into the lungs every 4 (four) hours as needed for wheezing or shortness of breath. 08/20/23   Byrum, Robert S, MD  Ascorbic Acid (VITAMIN C) 1000 MG tablet Take 1,000 mg by mouth daily.    [provider]  atorvastatin  (LIPITOR) 80 MG tablet TAKE 1 TABLET BY MOUTH EVERY DAY 08/21/23   Raford Riggs, MD  Budeson-Glycopyrrol-Formoterol  (BREZTRI  AEROSPHERE) 160-9-4.8 MCG/ACT AERO Inhale 2 puffs into the lungs in the morning and at bedtime. 10/03/22   Shelah Lamar RAMAN, MD  Calcium  Carb-Cholecalciferol (CALCIUM  600+D3 PO) Take 1 tablet by mouth daily.    [provider]  Cholecalciferol (VITAMIN D-3 PO) Take 1 tablet by mouth daily.    [provider]  esomeprazole  (NEXIUM ) 40 MG capsule TAKE 1 CAPSULE BY MOUTH 2 TIMES DAILY BEFORE A MEAL. 04/09/23   Shelah Lamar RAMAN, MD  ferrous sulfate  325 (65 FE) MG tablet Take 650 mg by mouth daily. 10/19/18   [provider]  fluticasone  (FLONASE ) 50 MCG/ACT nasal spray Place 1 spray into both nostrils in the morning and at bedtime. 12/21/22   Cheryle Page, MD  furosemide  (LASIX ) 20 MG tablet Take 1 tablet (20 mg total) by mouth as needed. Pt must schedule an overdue followup appt with Cardiology for any more refills. [phone] 3rd and FINAL attempt Thank You 12/17/23   Raford Riggs, MD  levocetirizine (XYZAL) 5 MG tablet Take 5 mg by mouth daily.    [provider]  levothyroxine  (SYNTHROID ) 75 MCG tablet Take 1 tablet by mouth daily.    [provider]  metFORMIN  (GLUCOPHAGE -XR) 500 MG 24 hr tablet  Take 500 mg by mouth every evening. 12/20/18   [provider]  metoprolol  tartrate (LOPRESSOR ) 50 MG tablet TAKE 1 TABLET BY MOUTH TWICE A DAY NEED APPT FOR REFILLS. 2ND ATTEMPT. 01/09/24   Raford Riggs, MD  Multiple Vitamins-Minerals (CENTRUM PO) Take 1 tablet by mouth daily.    [provider]  nitroGLYCERIN  (NITROSTAT ) 0.4 MG SL tablet Place 1 tablet (0.4 mg total) under the tongue every 5 (five) minutes as needed for chest pain. 02/21/17 11/09/23  Kilroy, Luke K, PA-C  predniSONE  (DELTASONE ) 10 MG tablet Take 1 tablet (10 mg total) by mouth daily with breakfast. 01/23/24   Hope Almarie ORN, NP  roflumilast  (DALIRESP ) 500 MCG TABS tablet Take 1 tablet (500 mcg total) by mouth daily. 03/05/23   Hope Almarie ORN, NP  traZODone  (DESYREL ) 50 MG tablet Take 50 mg by mouth at bedtime.  02/28/13   [provider]  venlafaxine  XR (EFFEXOR -XR) 150 MG 24 hr capsule Take 150 mg by mouth daily.  07/03/17   [provider]    Physical Exam: Vitals:   01/28/24 1755 01/28/24 1916 01/28/24 1930 01/28/24 1945  BP:  128/71  128/66 135/72  Pulse: (!) 134 (!) 136 (!) 138 (!) 136  Resp: (!) 38 (!) 28 (!) 35 (!) 46  Temp:  98.2 F (36.8 C)    TempSrc:  Oral    SpO2: 100% 100% 100% 100%  Weight:      Height:      Constitutional: Elderly female who appears to be in some distress Eyes: PERRL, lids and conjunctivae normal ENMT: Mucous membranes are moist. Posterior pharynx clear of any exudate or lesions.  Neck: normal, supple,   Respiratory: Currently on BiPAP with decreased aeration no significant wheezes able to be appreciated at this time.  Only able to talk in short sentences. Cardiovascular: Tachycardic. No extremity edema.  Peripheral pulses intact. Abdomen: no tenderness, no masses palpated.   Bowel sounds positive.  Musculoskeletal: no clubbing / cyanosis. No joint deformity upper and lower extremities. Good ROM, no contractures. Normal muscle tone.  Skin: no  rashes, lesions, ulcers. No induration Neurologic: CN 2-12 grossly intact.  Tremor present. Strength 5/5 in all 4.  Psychiatric: Normal judgment and insight. Alert and oriented x 3. Normal mood.   Data Reviewed:  Sinus tachycardia 143 bpm with a right bundle branch block with significant artifact noted in lead III.  Assessment and Plan:  Acute on chronic respiratory failure with hypoxia and hypercapnia COPD status and patient Patient presens with progressively worsening shortness of breath with cough and wheezing. Chest x-ray has showed no acute abnormality.  Influenza, COVID-19, and RSV screening were negative.    Patient had been started on BiPAP due to her breathing.  Venous blood gas revealed pCO2 69. - Admit to progressive bed - Continuous pulse oximetry with oxygen  to maintain O2 saturations greater than 88-90% - Continue BiPAP, wean off once able - Check complete respiratory virus panel - Check procalcitonin - Continue empiric antibiotics of Rocephin  and azithromycin . - DuoNebs scheduled and as needed- - Brovana  and budesonide  nebs as needed - Prednisone40 mg daily - Continue empiric antibiotics of Rocephin  and doxycycline   SIRS Lactic acidosis Patient was noted to be tachycardic and tachypneic with white blood cell count elevated at 13.7.  Lactic acid was noted to be elevated at 2.7->2.9, but could be secondary to breathing treatments given versus infection.  Urinalysis did not show any significant signs for infection.  Chest x-ray did not note any acute abnormality either.  Blood cultures have been obtained. - Follow-up blood cultures  Acute kidney injury superimposed on chronic kidney disease stage IIIa Labs revealed creatinine elevated up to 1.64 with BUN 17.  Baseline creatinine previously noted to be around 1-1.2.  Had been bolused 2 L of IV fluids. - Avoid possible nephrotoxic agents - Recheck kidney function in a.m.  Anemia Chronic.  Hemoglobin noted to be 9.8 which  appears to be around baseline.  No reports of bleeding - Recheck CBC in a.m.  Diabetes mellitus type II with hyperglycemia Last available hemoglobin A1c on file was 6.8 back in 12/2022. - Hypoglycemic protocols - Check hemoglobin A1c - Hold metformin  - CBGs before every meal with moderate SSI while on steroids - adjust regimen as deemed medically appropriate   Hypothyroidism - Check TSH and free T4 - Continue levothyroxine .  Adjust dose as deemed medically appropriate  Tremor Gait instability Son notes that patient has had this intermittent tremor over the past few months that has increased in frequency.  Question related to thyroid  status. - OT/PT to evaluate and treat  Hyperlipidemia - Continue atorvastatin   Thrombocytosis Acute.  Platelet count 471.  Thought to be acute phase reactant. - Continue to monitor  GERD - Continue Protonix  twice daily  Advance Care Planning:   Code Status: Do not attempt resuscitation (DNR) PRE-ARREST INTERVENTIONS DESIRED discussed with the patient and her son present at bedside who confirms that they wanted intubation if needed.  Consults: None  Family Communication: Son updated at bedside  Severity of Illness: The appropriate patient status for this patient is INPATIENT. Inpatient status is judged to be reasonable and necessary in order to provide the required intensity of service to ensure the patient's safety. The patient's presenting symptoms, physical exam findings, and initial radiographic and laboratory data in the context of their chronic comorbidities is felt to place them at high risk for further clinical deterioration. Furthermore, it is not anticipated that the patient will be medically stable for discharge from the hospital within 2 midnights of admission.   * I certify that at the point of admission it is my clinical judgment that the patient will require inpatient hospital care spanning beyond 2 midnights from the point of  admission due to high intensity of service, high risk for further deterioration and high frequency of surveillance required.*  Author: Maximino DELENA Sharps, MD 01/28/2024 8:45 PM  For on call review [email or web address].      [1]  Allergies Allergen Reactions   Lisinopril Cough    cough   Sulfonamide Derivatives Rash   "

## 2024-01-28 NOTE — ED Notes (Signed)
 Pure wick placed.

## 2024-01-28 NOTE — H&P (Incomplete)
 " History and Physical    Patient: [name] [name] FMW:[id number] DOB: [birth year] DOA: 01/28/2024 DOS: the patient was seen and examined on 01/28/2024 PCP: Chrystal Lamarr RAMAN, MD  Patient coming from: Home via EMS  Chief Complaint:  Chief Complaint  Patient presents with   Shortness of Breath   HPI: [name] [name] is a 79 y.o. female with medical history significant of COPD, chronic respiratory failure on 3 - 4 L, hypertension, hyperlipidemia, CAD, diabetes mellitus type 2, and hypothyroidism presents with shortness of breath and wheezing. She is accompanied by her son.  History is somewhat limited from the patient as she is currently on BiPAP.  She has been experiencing shortness of breath and wheezing for the past two weeks, accompanied by some chills.  She has had difficulty expectorating sputum despite frequent coughing. No recent exposure to anyone sick.  She also reports experiencing intermittent tremors over the past few months, with increased frequency in the last week or two.  She has been on levothyroxine  with alternating doses, which might have been adjusted over the last year or six months. She read something suggesting that levothyroxine  might not be good.  En route with EMS patient was noted to have O2 saturations listless 80% on 2 L nasal cannula oxygen .  Patient was given 2 DuoNeb breathing treatments, Solu-Medrol  125 mg IV, and magnesium  sulfate 2 g IV.  Upon admission patient was noted to be afebrile with pulse 137-151, respirations 29-43, blood pressures 122/59-137/65, and O2 saturations maintained on room air.  Labs significant for WBC 13.7, hemoglobin 9.8, platelets 471 BUN 17, creatinine 0.64, CO2 38, glucose 290, and lactic acid 2.7.  Chest x-ray showed no acute abnormality.  Blood cultures were obtained.  Patient had been given continuous albuterol  neb, DuoNeb breathing treatment, Cardizem  10 mg IV, 2 L of lactated Ringer 's, Rocephin , and  doxycycline .  Review of Systems: As mentioned in the history of present illness. All other systems reviewed and are negative. Past Medical History:  Diagnosis Date   Allergic rhinitis, cause unspecified    CAD in native artery 07/25/2017   Chronic airway obstruction, not elsewhere classified    COPD (chronic obstructive pulmonary disease) (HCC)    Depressive disorder, not elsewhere classified    Nephritis and nephropathy, not specified as acute or chronic, with unspecified pathological lesion in kidney    Obesity, unspecified    Other and unspecified hyperlipidemia    Oxygen  deficiency    Thyrotoxicosis without mention of goiter or other cause, without mention of thyrotoxic crisis or storm    Unspecified essential hypertension    UTI (urinary tract infection) 2019 dec   Past Surgical History:  Procedure Laterality Date   BIOPSY  01/19/2021   Procedure: BIOPSY;  Surgeon: San Sandor GAILS, DO;  Location: [facility];  Service: Gastroenterology;;   CHOLECYSTECTOMY     ESOPHAGOGASTRODUODENOSCOPY (EGD) WITH PROPOFOL  N/A 01/19/2021   Procedure: ESOPHAGOGASTRODUODENOSCOPY (EGD) WITH PROPOFOL ;  Surgeon: San Sandor GAILS, DO;  Location: [facility];  Service: Gastroenterology;  Laterality: N/A;   LEFT HEART CATH AND CORONARY ANGIOGRAPHY N/A 03/01/2017   Procedure: LEFT HEART CATH AND CORONARY ANGIOGRAPHY;  Surgeon: Mady Bruckner, MD;  Location: [facility];  Service: Cardiovascular;  Laterality: N/A;   Social History:  reports that she quit smoking about 30 years ago. Her smoking use included cigarettes. She started smoking about 64 years ago. She has a 68 pack-year smoking history. She has never used smokeless tobacco. She reports that she does not  drink alcohol and does not use drugs.  Allergies[1]  Family History  Problem Relation Age of Onset   Heart failure Mother    Hyperlipidemia Mother    Hypertension Mother    Diabetes Mother    CAD Mother     Cancer Brother    Stroke Maternal Grandfather    Diabetes Brother     Prior to Admission medications  Medication Sig Start Date End Date Taking? Authorizing Provider  albuterol  (PROVENTIL ) (2.5 MG/3ML) 0.083% nebulizer solution TAKE 3 ML (2.5 MG TOTAL) BY NEBULIZATION EVERY 4 HOURS AS NEEDED FOR WHEEZING OR SHORTNESS OF BREATH 08/20/23   Shelah Lamar RAMAN, MD  albuterol  (VENTOLIN  HFA) 108 (90 Base) MCG/ACT inhaler Inhale 2 puffs into the lungs every 4 (four) hours as needed for wheezing or shortness of breath. 08/20/23   Byrum, Robert S, MD  Ascorbic Acid (VITAMIN C) 1000 MG tablet Take 1,000 mg by mouth daily.    [provider]  atorvastatin  (LIPITOR) 80 MG tablet TAKE 1 TABLET BY MOUTH EVERY DAY 08/21/23   Raford Riggs, MD  Budeson-Glycopyrrol-Formoterol  (BREZTRI  AEROSPHERE) 160-9-4.8 MCG/ACT AERO Inhale 2 puffs into the lungs in the morning and at bedtime. 10/03/22   Shelah Lamar RAMAN, MD  Calcium  Carb-Cholecalciferol (CALCIUM  600+D3 PO) Take 1 tablet by mouth daily.    [provider]  Cholecalciferol (VITAMIN D-3 PO) Take 1 tablet by mouth daily.    [provider]  esomeprazole  (NEXIUM ) 40 MG capsule TAKE 1 CAPSULE BY MOUTH 2 TIMES DAILY BEFORE A MEAL. 04/09/23   Byrum, Robert S, MD  ferrous sulfate  325 (65 FE) MG tablet Take 650 mg by mouth daily. 10/19/18   [provider]  fluticasone  (FLONASE ) 50 MCG/ACT nasal spray Place 1 spray into both nostrils in the morning and at bedtime. 12/21/22   Cheryle Page, MD  furosemide  (LASIX ) 20 MG tablet Take 1 tablet (20 mg total) by mouth as needed. Pt must schedule an overdue followup appt with Cardiology for any more refills. [phone] 3rd and FINAL attempt Thank You 12/17/23   Raford Riggs, MD  levocetirizine (XYZAL) 5 MG tablet Take 5 mg by mouth daily.    [provider]  levothyroxine  (SYNTHROID ) 75 MCG tablet Take 1 tablet by mouth daily.    [provider]  metFORMIN   (GLUCOPHAGE -XR) 500 MG 24 hr tablet Take 500 mg by mouth every evening. 12/20/18   [provider]  metoprolol  tartrate (LOPRESSOR ) 50 MG tablet TAKE 1 TABLET BY MOUTH TWICE A DAY NEED APPT FOR REFILLS. 2ND ATTEMPT. 01/09/24   Raford Riggs, MD  Multiple Vitamins-Minerals (CENTRUM PO) Take 1 tablet by mouth daily.    [provider]  nitroGLYCERIN  (NITROSTAT ) 0.4 MG SL tablet Place 1 tablet (0.4 mg total) under the tongue every 5 (five) minutes as needed for chest pain. 02/21/17 11/09/23  Kilroy, Luke K, PA-C  predniSONE  (DELTASONE ) 10 MG tablet Take 1 tablet (10 mg total) by mouth daily with breakfast. 01/23/24   Hope Almarie ORN, NP  roflumilast  (DALIRESP ) 500 MCG TABS tablet Take 1 tablet (500 mcg total) by mouth daily. 03/05/23   Hope Almarie ORN, NP  traZODone  (DESYREL ) 50 MG tablet Take 50 mg by mouth at bedtime.  02/28/13   [provider]  venlafaxine  XR (EFFEXOR -XR) 150 MG 24 hr capsule Take 150 mg by mouth daily.  07/03/17   [provider]    Physical Exam: Vitals:   01/28/24 1755 01/28/24 1916 01/28/24 1930 01/28/24 1945  BP:  128/71  128/66 135/72  Pulse: (!) 134 (!) 136 (!) 138 (!) 136  Resp: (!) 38 (!) 28 (!) 35 (!) 46  Temp:  98.2 F (36.8 C)    TempSrc:  Oral    SpO2: 100% 100% 100% 100%  Weight:      Height:      Constitutional: Elderly female who appears to be in some distress Eyes: PERRL, lids and conjunctivae normal ENMT: Mucous membranes are moist. Posterior pharynx clear of any exudate or lesions.  Neck: normal, supple,   Respiratory: Currently on BiPAP with decreased aeration no significant wheezes able to be appreciated at this time.  Only able to talk in short sentences. Cardiovascular: Tachycardic. No extremity edema.  Peripheral pulses intact. Abdomen: no tenderness, no masses palpated.   Bowel sounds positive.  Musculoskeletal: no clubbing / cyanosis. No joint deformity upper and lower extremities. Good ROM, no  contractures. Normal muscle tone.  Skin: no rashes, lesions, ulcers. No induration Neurologic: CN 2-12 grossly intact.  Tremor present. Strength 5/5 in all 4.  Psychiatric: Normal judgment and insight. Alert and oriented x 3. Normal mood.   Data Reviewed: {Tip this will not be part of the note when signed- Document your independent interpretation of telemetry tracing, EKG, lab, Radiology test or any other diagnostic tests. Add any new diagnostic test ordered today. (Optional):[id number]} Sinus tachycardia 143 bpm with a right bundle branch block with significant artifact noted in lead III.  Assessment and Plan:  Acute on chronic respiratory failure with hypoxia  Patient presens with progressively worsening shortness of breath with cough and wheezing..  Chest x-ray has showed no acute abnormality.  Influenza, COVID-19, and RSV screening were negative.     - Admit to progressive bed - Continuous pulse oximetry with oxygen  to maintain O2 saturations greater than 88-90% - Check complete respiratory virus panel - Check procalcitonin - Continue empiric antibiotics of Rocephin  and azithromycin . - DuoNebs every 4 hours -  SIRS   Acute kidney injury superimposed on chronic kidney disease stage IIIa Labs revealed creatinine elevated up to 1.64 with BUN 17.  Baseline creatinine previously noted to be around 1-1.2.  Had been bolused 2 L of IV fluids. - Avoid possible nephrotoxic agents -     Diabetes mellitus type II with hyperglycemia Last available hemoglobin A1c on file was 6.8 back in 12/2022.   Hypothyroidism - Check TSH - Continue levothyroxine   Advance Care Planning:   Code Status: Do not attempt resuscitation (DNR) PRE-ARREST INTERVENTIONS DESIRED discussed with the patient and her son present at bedside who confirms that they wanted intubation if needed.  Consults: None  Family Communication: ***  Severity of Illness: The appropriate patient status for this patient is  INPATIENT. Inpatient status is judged to be reasonable and necessary in order to provide the required intensity of service to ensure the patient's safety. The patient's presenting symptoms, physical exam findings, and initial radiographic and laboratory data in the context of their chronic comorbidities is felt to place them at high risk for further clinical deterioration. Furthermore, it is not anticipated that the patient will be medically stable for discharge from the hospital within 2 midnights of admission.   * I certify that at the point of admission it is my clinical judgment that the patient will require inpatient hospital care spanning beyond 2 midnights from the point of admission due to high intensity of service, high risk for further deterioration and high frequency of surveillance required.*  Author: Maximino DELENA Sharps, MD 01/28/2024  8:45 PM  For on call review [email or web address].        [1] Allergies Allergen Reactions   Lisinopril Cough    cough   Sulfonamide Derivatives Rash  "

## 2024-01-29 DIAGNOSIS — D75839 Thrombocytosis, unspecified: Secondary | ICD-10-CM | POA: Diagnosis present

## 2024-01-29 DIAGNOSIS — R269 Unspecified abnormalities of gait and mobility: Secondary | ICD-10-CM

## 2024-01-29 DIAGNOSIS — R251 Tremor, unspecified: Secondary | ICD-10-CM | POA: Diagnosis present

## 2024-01-29 DIAGNOSIS — E039 Hypothyroidism, unspecified: Secondary | ICD-10-CM | POA: Diagnosis not present

## 2024-01-29 DIAGNOSIS — E785 Hyperlipidemia, unspecified: Secondary | ICD-10-CM | POA: Diagnosis not present

## 2024-01-29 DIAGNOSIS — K219 Gastro-esophageal reflux disease without esophagitis: Secondary | ICD-10-CM | POA: Diagnosis not present

## 2024-01-29 DIAGNOSIS — R6510 Systemic inflammatory response syndrome (SIRS) of non-infectious origin without acute organ dysfunction: Secondary | ICD-10-CM | POA: Diagnosis not present

## 2024-01-29 DIAGNOSIS — E872 Acidosis, unspecified: Secondary | ICD-10-CM | POA: Diagnosis present

## 2024-01-29 DIAGNOSIS — I10 Essential (primary) hypertension: Secondary | ICD-10-CM | POA: Diagnosis not present

## 2024-01-29 DIAGNOSIS — E1169 Type 2 diabetes mellitus with other specified complication: Secondary | ICD-10-CM | POA: Diagnosis not present

## 2024-01-29 DIAGNOSIS — N179 Acute kidney failure, unspecified: Secondary | ICD-10-CM | POA: Diagnosis not present

## 2024-01-29 DIAGNOSIS — J9621 Acute and chronic respiratory failure with hypoxia: Secondary | ICD-10-CM | POA: Diagnosis not present

## 2024-01-29 DIAGNOSIS — J441 Chronic obstructive pulmonary disease with (acute) exacerbation: Secondary | ICD-10-CM | POA: Diagnosis not present

## 2024-01-29 LAB — HEMOGLOBIN A1C
Hgb A1c MFr Bld: 7 % — ABNORMAL HIGH (ref 4.8–5.6)
Mean Plasma Glucose: 154.2 mg/dL

## 2024-01-29 LAB — GLUCOSE, CAPILLARY
Glucose-Capillary: 131 mg/dL — ABNORMAL HIGH (ref 70–99)
Glucose-Capillary: 74 mg/dL (ref 70–99)
Glucose-Capillary: 142 mg/dL — ABNORMAL HIGH (ref 70–99)

## 2024-01-29 LAB — BASIC METABOLIC PANEL WITH GFR
Anion gap: 9 (ref 5–15)
BUN: 15 mg/dL (ref 8–23)
CO2: 34 mmol/L — ABNORMAL HIGH (ref 22–32)
Calcium: 8.8 mg/dL — ABNORMAL LOW (ref 8.9–10.3)
Chloride: 103 mmol/L (ref 98–111)
Creatinine, Ser: 1.38 mg/dL — ABNORMAL HIGH (ref 0.44–1.00)
GFR, Estimated: 39 mL/min — ABNORMAL LOW
Glucose, Bld: 178 mg/dL — ABNORMAL HIGH (ref 70–99)
Potassium: 4 mmol/L (ref 3.5–5.1)
Sodium: 146 mmol/L — ABNORMAL HIGH (ref 135–145)

## 2024-01-29 LAB — CBC
HCT: 26.9 % — ABNORMAL LOW (ref 36.0–46.0)
Hemoglobin: 8.3 g/dL — ABNORMAL LOW (ref 12.0–15.0)
MCH: 29.9 pg (ref 26.0–34.0)
MCHC: 30.9 g/dL (ref 30.0–36.0)
MCV: 96.8 fL (ref 80.0–100.0)
Platelets: 366 K/uL (ref 150–400)
RBC: 2.78 MIL/uL — ABNORMAL LOW (ref 3.87–5.11)
RDW: 13.6 % (ref 11.5–15.5)
WBC: 11.5 K/uL — ABNORMAL HIGH (ref 4.0–10.5)
nRBC: 0 % (ref 0.0–0.2)

## 2024-01-29 LAB — CBG MONITORING, ED
Glucose-Capillary: 153 mg/dL — ABNORMAL HIGH (ref 70–99)
Glucose-Capillary: 132 mg/dL — ABNORMAL HIGH (ref 70–99)

## 2024-01-29 LAB — TYPE AND SCREEN
ABO/RH(D): A POS
Antibody Screen: NEGATIVE

## 2024-01-29 LAB — T4, FREE: Free T4: 0.92 ng/dL (ref 0.80–2.00)

## 2024-01-29 MED ORDER — ATORVASTATIN CALCIUM 40 MG PO TABS
80.0000 mg | ORAL_TABLET | Freq: Every day | ORAL | Status: DC
  Administered 2024-01-30 – 2024-02-01 (×3): 80 mg via ORAL
  Filled 2024-01-29 (×4): qty 2

## 2024-01-29 MED ORDER — FLUTICASONE PROPIONATE 50 MCG/ACT NA SUSP
2.0000 | Freq: Every day | NASAL | Status: DC
  Administered 2024-01-30 – 2024-02-01 (×3): 2 via NASAL
  Filled 2024-01-29: qty 16

## 2024-01-29 MED ORDER — INSULIN ASPART 100 UNIT/ML IJ SOLN
0.0000 [IU] | Freq: Every day | INTRAMUSCULAR | Status: DC
  Administered 2024-01-30: 2 [IU] via SUBCUTANEOUS
  Filled 2024-01-29: qty 2

## 2024-01-29 MED ORDER — LEVOTHYROXINE SODIUM 50 MCG PO TABS
75.0000 ug | ORAL_TABLET | Freq: Every day | ORAL | Status: DC
  Administered 2024-01-30 – 2024-02-01 (×3): 75 ug via ORAL
  Filled 2024-01-29 (×3): qty 1

## 2024-01-29 MED ORDER — METOPROLOL TARTRATE 50 MG PO TABS
50.0000 mg | ORAL_TABLET | Freq: Two times a day (BID) | ORAL | Status: DC
  Administered 2024-01-29 – 2024-02-01 (×7): 50 mg via ORAL
  Filled 2024-01-29 (×4): qty 1
  Filled 2024-01-29 (×2): qty 2
  Filled 2024-01-29 (×2): qty 1

## 2024-01-29 MED ORDER — DOXYCYCLINE HYCLATE 100 MG PO TABS
100.0000 mg | ORAL_TABLET | Freq: Two times a day (BID) | ORAL | Status: DC
  Administered 2024-01-29 – 2024-01-31 (×4): 100 mg via ORAL
  Filled 2024-01-29 (×4): qty 1

## 2024-01-29 MED ORDER — TRAZODONE HCL 50 MG PO TABS
50.0000 mg | ORAL_TABLET | Freq: Every day | ORAL | Status: DC
  Administered 2024-01-29 – 2024-01-31 (×4): 50 mg via ORAL
  Filled 2024-01-29 (×4): qty 1

## 2024-01-29 MED ORDER — GUAIFENESIN ER 600 MG PO TB12
600.0000 mg | ORAL_TABLET | Freq: Two times a day (BID) | ORAL | Status: DC
  Administered 2024-01-29: 600 mg via ORAL
  Filled 2024-01-29: qty 1

## 2024-01-29 MED ORDER — VENLAFAXINE HCL ER 150 MG PO CP24
150.0000 mg | ORAL_CAPSULE | Freq: Every day | ORAL | Status: DC
  Administered 2024-01-29 – 2024-02-01 (×4): 150 mg via ORAL
  Filled 2024-01-29 (×4): qty 1

## 2024-01-29 MED ORDER — PANTOPRAZOLE SODIUM 40 MG PO TBEC
40.0000 mg | DELAYED_RELEASE_TABLET | Freq: Two times a day (BID) | ORAL | Status: DC
  Administered 2024-01-29 – 2024-02-01 (×7): 40 mg via ORAL
  Filled 2024-01-29 (×8): qty 1

## 2024-01-29 MED ORDER — FERROUS SULFATE 325 (65 FE) MG PO TABS
650.0000 mg | ORAL_TABLET | Freq: Every day | ORAL | Status: DC
  Administered 2024-01-30 – 2024-02-01 (×3): 650 mg via ORAL
  Filled 2024-01-29 (×3): qty 2

## 2024-01-29 MED ORDER — LORATADINE 10 MG PO TABS
10.0000 mg | ORAL_TABLET | Freq: Every day | ORAL | Status: DC
  Administered 2024-01-30 – 2024-02-01 (×3): 10 mg via ORAL
  Filled 2024-01-29 (×4): qty 1

## 2024-01-29 MED ORDER — GUAIFENESIN ER 600 MG PO TB12
1200.0000 mg | ORAL_TABLET | Freq: Two times a day (BID) | ORAL | Status: DC
  Administered 2024-01-29 – 2024-02-01 (×6): 1200 mg via ORAL
  Filled 2024-01-29 (×7): qty 2

## 2024-01-29 MED ORDER — INSULIN ASPART 100 UNIT/ML IJ SOLN
0.0000 [IU] | Freq: Three times a day (TID) | INTRAMUSCULAR | Status: DC
  Administered 2024-01-29 (×2): 2 [IU] via SUBCUTANEOUS
  Administered 2024-01-30 – 2024-01-31 (×5): 3 [IU] via SUBCUTANEOUS
  Administered 2024-01-31 – 2024-02-01 (×3): 2 [IU] via SUBCUTANEOUS
  Filled 2024-01-29 (×3): qty 2
  Filled 2024-01-29: qty 3
  Filled 2024-01-29 (×2): qty 2
  Filled 2024-01-29 (×2): qty 3
  Filled 2024-01-29 (×2): qty 2

## 2024-01-29 MED ORDER — SODIUM CHLORIDE 0.45 % IV SOLN: INTRAVENOUS | Status: AC

## 2024-01-29 MED ORDER — METHYLPREDNISOLONE SODIUM SUCC 40 MG IJ SOLR
40.0000 mg | Freq: Two times a day (BID) | INTRAMUSCULAR | Status: DC
  Administered 2024-01-29 – 2024-02-01 (×6): 40 mg via INTRAVENOUS
  Filled 2024-01-29 (×7): qty 1

## 2024-01-29 NOTE — Procedures (Signed)
 PT taken of bipap per order, tolerating well at this time.

## 2024-01-29 NOTE — ED Notes (Signed)
 Pt given something to eat and drink. Okay per admitting MD

## 2024-01-29 NOTE — Progress Notes (Signed)
 RT spoke with RN concerning sign to keep door open due to BiPAP usage (has been hung at 1245). Also asked RN to obtain order to have PT telemetry include Sp02 monitoring.

## 2024-01-29 NOTE — Progress Notes (Signed)
" °   01/29/24 1000  BiPAP/CPAP/SIPAP  BiPAP/CPAP/SIPAP Pt Type Adult  BiPAP/CPAP/SIPAP SERVO  Mask Type Full face mask  Dentures removed? Not applicable  Mask Size Small  Set Rate 16 breaths/min  Respiratory Rate 36 breaths/min  IPAP 16 cmH20  EPAP 6 cmH2O  Pressure Support 10 cmH20  FiO2 (%) 36 %  Minute Ventilation 10.2  Leak 26  Peak Inspiratory Pressure (PIP) 20  Tidal Volume (Vt) 335  Patient Home Machine No  Patient Home Mask No  Patient Home Tubing No  Auto Titrate No  Press High Alarm 30 cmH2O  CPAP/SIPAP surface wiped down Yes  Device Plugged into RED Power Outlet Yes  BiPAP/CPAP /SiPAP Vitals  Pulse Rate (!) 117  Resp (!) 36  BP (!) 194/108  SpO2 95 %  Bilateral Breath Sounds Diminished;Expiratory wheezes;Fine crackles  MEWS Score/Color  MEWS Score 5  MEWS Score Color Red   Patient placed back on BiPAP at this time due to increase WOB and tachypnea. RN aware.  "

## 2024-01-29 NOTE — Progress Notes (Addendum)
 " PROGRESS NOTE    [name] [name]  FMW:[id number] DOB: Jan 06, 1946 DOA: 01/28/2024 PCP: Chrystal Lamarr RAMAN, MD   Chief Complaint  Patient presents with   Shortness of Breath    Brief Narrative:  Patient is a 79 year old female, history of chronic respiratory failure on 3 to 4 L nasal cannula, CAD, type 2 diabetes, hypothyroidism presenting with shortness of breath and wheezing.  Patient noted to be on the BiPAP in the ED. -Patient also with intermittent tremors over the past few weeks. -   Assessment & Plan:   Principal Problem:   Acute on chronic respiratory failure with hypoxia and hypercapnia (HCC) Active Problems:   COPD with acute exacerbation (HCC)   SIRS (systemic inflammatory response syndrome) (HCC)   Lactic acidosis   Acute kidney injury superimposed on chronic kidney disease   Normocytic anemia   Type 2 diabetes mellitus (HCC)   Hypothyroidism   Tremor   Gait disturbance   Dyslipidemia   Thrombocytosis   GERD (gastroesophageal reflux disease)   Essential hypertension   COPD (chronic obstructive pulmonary disease) (HCC)   #1 acute on chronic respiratory failure with hypoxia and hypercapnia/acute COPD exacerbation. -Patient had presented with progressive worsening shortness of breath, cough, wheezing. - Chest x-ray done with no acute abnormalities. - Influenza A and B by PCR negative. -Respiratory viral panel negative. - COVID-19 by PCR negative. -Improving slowly clinically. -Continue empiric IV Rocephin  and doxycycline . -Discontinue prednisone  and place on Solu-Medrol  40 mg IV every 12 hours. -Continue Brovana , Pulmicort , scheduled DuoNebs. -Increase Mucinex  to 1200 mg twice daily. -Placed on Flonase , Claritin , PPI. - BiPAP as needed and BiPAP nightly (patient states he uses BiPAP at home nightly.) - SSI.  2.  SIRS/lactic acidosis -Patient on admission noted to be tachycardic, tachypneic, leukocytosis white count of 13.7 K.  Lactic acid was  noted to be elevated 2.7>> 2.9 Could be secondary to breathing treatments versus infection. - Urinalysis with no nitrites, no leukocytes. - Chest x-ray negative for any acute infiltrates. - Blood cultures pending. -Repeat lactic acid levels in the AM. -Currently on empiric IV antibiotics secondary to problem #1. - Supportive care.  3.  AKI injury on superimposed chronic kidney disease stage IIIa -Lab work noted patient to have a creatinine elevated at 1.64 with a BUN of 17. - Baseline creatinine approximately 1-1.2. -Improved with hydration. -Creatinine currently at 1.38 from 1.64 on admission. -Continue gentle hydration. -Avoid nephrotoxins. -Stable.  4.  Anemia -H&H stable. - Patient asymptomatic. -Check anemia panel. -Resume home regimen oral iron supplementation. - Outpatient follow-up.  5.  GERD -PPI.  6.  Tremor/gait instability -Patient's son noted that patient has had intermittent tremors over the past few months with increased frequency.  Question related to thyroid  status. - PT /OT.  7.  Thrombocytosis  - Likely acute phase reactant. - Follow.  8.  Hypothyroidism -Resume home regimen Synthroid .  9.  Hyperlipidemia -Continue statin.  10.  Diabetes mellitus type 2 -Hemoglobin A1c 7.0 - Patient on metformin  prior to admission which we will hold. - CBG noted at 132 this morning. - SSI.  11.  Hypertension -Continue home regimen Lopressor  50 mg twice daily.    DVT prophylaxis: (Lovenox /Heparin /SCD's/anticoagulated/None (if comfort care) Code Status: DNR Family Communication: Updated patient. Disposition: TBD  Status is: Inpatient Remains inpatient appropriate because: Severity of illness   Consultants:  None  Procedures:  Chest x-ray 01/28/2024   Antimicrobials:  Anti-infectives (From admission, onward)    Start     Dose/Rate  Route Frequency Ordered Stop   01/29/24 1800  doxycycline  (VIBRA -TABS) tablet 100 mg        100 mg Oral Every 12  hours 01/29/24 1520     01/29/24 1400  cefTRIAXone  (ROCEPHIN ) 2 g in sodium chloride  0.9 % 100 mL IVPB        2 g 200 mL/hr over 30 Minutes Intravenous Every 24 hours 01/28/24 1829     01/29/24 0600  doxycycline  (VIBRAMYCIN ) 100 mg in sodium chloride  0.9 % 250 mL IVPB  Status:  Discontinued        100 mg 125 mL/hr over 120 Minutes Intravenous Every 12 hours 01/28/24 1829 01/29/24 1520   01/28/24 1600  cefTRIAXone  (ROCEPHIN ) 1 g in sodium chloride  0.9 % 100 mL IVPB        1 g 200 mL/hr over 30 Minutes Intravenous  Once 01/28/24 1557 01/28/24 1643   01/28/24 1600  doxycycline  (VIBRAMYCIN ) 100 mg in sodium chloride  0.9 % 250 mL IVPB        100 mg 125 mL/hr over 120 Minutes Intravenous  Once 01/28/24 1557 01/28/24 1814         Subjective: Patient sitting up on gurney on 4 L nasal cannula with sats above 92%.  Patient states she feels a little bit better than on admission in terms of her shortness of breath.  Still with some wheezing.  Denies any chest pain.  Complain of feeling significantly tired.  Noted to have just eating breakfast.  Noted to have been on BiPAP on presentation and transition to nasal cannula around 3 to 4 AM per charting.  Objective: Vitals:   01/29/24 1234 01/29/24 1236 01/29/24 1330 01/29/24 1510  BP:   114/69   Pulse:  (!) 110 (!) 107   Resp:  (!) 24    Temp:   97.6 F (36.4 C)   TempSrc:   Axillary   SpO2: 96% 96% 100% 97%  Weight:      Height:        Intake/Output Summary (Last 24 hours) at 01/29/2024 1605 Last data filed at 01/28/2024 1906 Gross per 24 hour  Intake 2294.8 ml  Output --  Net 2294.8 ml   Filed Weights   01/28/24 1441  Weight: 53.6 kg    Examination:  General exam: Appears calm and comfortable  Respiratory system: Diffuse wheezing.  Scattered coarse breath sounds/crackles.  Poor to fair air movement.  No use of accessory muscles of respiration.  Speaking in full sentences.  Some diffuse wheezing.  Some scattered coarse breath  sounds/crackles.  Poor to fair air movement.  Cardiovascular system: RRR no murmurs rubs or gallops.  No JVD.  No lower extremity edema. Gastrointestinal system: Abdomen is nondistended, soft and nontender. No organomegaly or masses felt. Normal bowel sounds heard. Central nervous system: Alert and oriented. No focal neurological deficits. Extremities: Symmetric 5 x 5 power. Skin: No rashes, lesions or ulcers Psychiatry: Judgement and insight appear normal. Mood & affect appropriate.     Data Reviewed: I have personally reviewed following labs and imaging studies  CBC: Recent Labs  Lab 01/28/24 1515 01/29/24 0214  WBC 13.7* 11.5*  NEUTROABS 11.4*  --   HGB 9.8* 8.3*  HCT 32.0* 26.9*  MCV 96.7 96.8  PLT 471* 366    Basic Metabolic Panel: Recent Labs  Lab 01/28/24 1515 01/29/24 0214  NA 145 146*  K 4.0 4.0  CL 98 103  CO2 38* 34*  GLUCOSE 290* 178*  BUN 17 15  CREATININE 1.64* 1.38*  CALCIUM  9.5 8.8*    GFR: Estimated Creatinine Clearance: 23.7 mL/min (A) (by C-G formula based on SCr of 1.38 mg/dL (H)).  Liver Function Tests: Recent Labs  Lab 01/28/24 1515  AST 23  ALT 13  ALKPHOS 76  BILITOT 0.3  PROT 7.0  ALBUMIN 3.8    CBG: Recent Labs  Lab 01/29/24 0201 01/29/24 0956 01/29/24 1116  GLUCAP 153* 132* 131*     Recent Results (from the past 240 hours)  Blood Culture (routine x 2)     Status: None (Preliminary result)   Collection Time: 01/28/24  3:15 PM   Specimen: BLOOD  Result Value Ref Range Status   Specimen Description   Final    BLOOD RIGHT ANTECUBITAL Performed at [facility], [address] [place], [place] [address]    Special Requests   Final    BOTTLES DRAWN AEROBIC AND ANAEROBIC Blood Culture results may not be optimal due to an inadequate volume of blood received in culture bottles Performed at [facility], [address], [place], [place] [address]    Culture   Final    NO GROWTH < 24  HOURS Performed at [facility], [address] [place], [place] [address]    Report Status PENDING  Incomplete  Resp panel by RT-PCR (RSV, Flu A&B, Covid) Anterior Nasal Swab     Status: None   Collection Time: 01/28/24  3:43 PM   Specimen: Anterior Nasal Swab  Result Value Ref Range Status   SARS Coronavirus 2 by RT PCR NEGATIVE NEGATIVE Final    Comment: (NOTE) SARS-CoV-2 target nucleic acids are NOT DETECTED.  The SARS-CoV-2 RNA is generally detectable in upper respiratory specimens during the acute phase of infection. The lowest concentration of SARS-CoV-2 viral copies this assay can detect is 138 copies/mL. A negative result does not preclude SARS-Cov-2 infection and should not be used as the sole basis for treatment or other patient management decisions. A negative result may occur with  improper specimen collection/handling, submission of specimen other than nasopharyngeal swab, presence of viral mutation(s) within the areas targeted by this assay, and inadequate number of viral copies(<138 copies/mL). A negative result must be combined with clinical observations, patient history, and epidemiological information. The expected result is Negative.  Fact Sheet for Patients:  bloggercourse.com  Fact Sheet for Healthcare Providers:  seriousbroker.it  This test is no t yet approved or cleared by the [place]  FDA and  has been authorized for detection and/or diagnosis of SARS-CoV-2 by FDA under an Emergency Use Authorization (EUA). This EUA will remain  in effect (meaning this test can be used) for the duration of the COVID-19 declaration under Section 564(b)(1) of the Act, 21 U.S.C.section 360bbb-3(b)(1), unless the authorization is terminated  or revoked sooner.       Influenza A by PCR NEGATIVE NEGATIVE Final   Influenza B by PCR NEGATIVE NEGATIVE Final    Comment: (NOTE) The Xpert Xpress SARS-CoV-2/FLU/RSV  plus assay is intended as an aid in the diagnosis of influenza from Nasopharyngeal swab specimens and should not be used as a sole basis for treatment. Nasal washings and aspirates are unacceptable for Xpert Xpress SARS-CoV-2/FLU/RSV testing.  Fact Sheet for Patients: bloggercourse.com  Fact Sheet for Healthcare Providers: seriousbroker.it  This test is not yet approved or cleared by the [place]  FDA and has been authorized for detection and/or diagnosis of SARS-CoV-2 by FDA under an Emergency Use Authorization (EUA). This  EUA will remain in effect (meaning this test can be used) for the duration of the COVID-19 declaration under Section 564(b)(1) of the Act, 21 U.S.C. section 360bbb-3(b)(1), unless the authorization is terminated or revoked.     Resp Syncytial Virus by PCR NEGATIVE NEGATIVE Final    Comment: (NOTE) Fact Sheet for Patients: bloggercourse.com  Fact Sheet for Healthcare Providers: seriousbroker.it  This test is not yet approved or cleared by the [place]  FDA and has been authorized for detection and/or diagnosis of SARS-CoV-2 by FDA under an Emergency Use Authorization (EUA). This EUA will remain in effect (meaning this test can be used) for the duration of the COVID-19 declaration under Section 564(b)(1) of the Act, 21 U.S.C. section 360bbb-3(b)(1), unless the authorization is terminated or revoked.  Performed at [facility], [address], [place], [place] [address]   Respiratory (~20 pathogens) panel by PCR     Status: None   Collection Time: 01/28/24  3:43 PM   Specimen: Nasopharyngeal Swab; Respiratory  Result Value Ref Range Status   Adenovirus NOT DETECTED NOT DETECTED Final   Coronavirus 229E NOT DETECTED NOT DETECTED Final    Comment: (NOTE) The Coronavirus on the Respiratory Panel, DOES NOT test for the novel   Coronavirus (2019 nCoV)    Coronavirus HKU1 NOT DETECTED NOT DETECTED Final   Coronavirus NL63 NOT DETECTED NOT DETECTED Final   Coronavirus OC43 NOT DETECTED NOT DETECTED Final   Metapneumovirus NOT DETECTED NOT DETECTED Final   Rhinovirus / Enterovirus NOT DETECTED NOT DETECTED Final   Influenza A NOT DETECTED NOT DETECTED Final   Influenza B NOT DETECTED NOT DETECTED Final   Parainfluenza Virus 1 NOT DETECTED NOT DETECTED Final   Parainfluenza Virus 2 NOT DETECTED NOT DETECTED Final   Parainfluenza Virus 3 NOT DETECTED NOT DETECTED Final   Parainfluenza Virus 4 NOT DETECTED NOT DETECTED Final   Respiratory Syncytial Virus NOT DETECTED NOT DETECTED Final   Bordetella pertussis NOT DETECTED NOT DETECTED Final   Bordetella Parapertussis NOT DETECTED NOT DETECTED Final   Chlamydophila pneumoniae NOT DETECTED NOT DETECTED Final   Mycoplasma pneumoniae NOT DETECTED NOT DETECTED Final    Comment: Performed at [facility], [address], [place], [place] [address]  Blood Culture (routine x 2)     Status: None (Preliminary result)   Collection Time: 01/28/24  7:56 PM   Specimen: BLOOD LEFT FOREARM  Result Value Ref Range Status   Specimen Description   Final    BLOOD LEFT FOREARM Performed at [facility], [address] [place], [place] [address]    Special Requests   Final    BOTTLES DRAWN AEROBIC AND ANAEROBIC Blood Culture results may not be optimal due to an inadequate volume of blood received in culture bottles Performed at [facility], [address] [place], [place] [address]    Culture   Final    NO GROWTH < 12 HOURS Performed at [facility], [address] [place], [place] [address]    Report Status PENDING  Incomplete         Radiology Studies: DG Chest Port 1 View Result Date: 01/28/2024 CLINICAL DATA:  Worsening shortness of breath, lethargy and weakness. EXAM: PORTABLE CHEST 1 VIEW COMPARISON:  May 01, 2023 FINDINGS:  The heart size and mediastinal contours are within normal limits. There is marked severity calcification of the aortic arch. The lungs are hyperinflated. Both lungs are clear. The visualized skeletal structures  are unremarkable. IMPRESSION: No active cardiopulmonary disease. Electronically Signed   By: Suzen Dials M.D.   On: 01/28/2024 16:30        Scheduled Meds:  arformoterol   15 mcg Nebulization BID   atorvastatin   80 mg Oral Daily   budesonide  (PULMICORT ) nebulizer solution  0.5 mg Nebulization BID   doxycycline   100 mg Oral Q12H   enoxaparin  (LOVENOX ) injection  30 mg Subcutaneous QHS   ferrous sulfate   650 mg Oral Daily   fluticasone   2 spray Each Nare Daily   guaiFENesin   1,200 mg Oral BID   insulin  aspart  0-15 Units Subcutaneous TID WC   insulin  aspart  0-5 Units Subcutaneous QHS   ipratropium-albuterol   3 mL Nebulization Q4H   levothyroxine   75 mcg Oral Daily   loratadine   10 mg Oral Daily   methylPREDNISolone  (SOLU-MEDROL ) injection  40 mg Intravenous Q12H   metoprolol  tartrate  50 mg Oral BID   pantoprazole   40 mg Oral BID   sodium chloride  flush  3 mL Intravenous Q12H   traZODone   50 mg Oral QHS   venlafaxine  XR  150 mg Oral Daily   Continuous Infusions:  sodium chloride  100 mL/hr at 01/29/24 1048   cefTRIAXone  (ROCEPHIN )  IV 2 g (01/29/24 1402)     LOS: 1 day    Time spent: 40 minutes    Toribio Hummer, MD [facility]   To contact the attending provider between 7A-7P or the covering provider during after hours 7P-7A, please log into the web site [email or web address] and access using universal Magazine password for that web site. If you do not have the password, please call the hospital operator.  01/29/2024, 4:05 PM    "

## 2024-01-29 NOTE — Progress Notes (Signed)
 OT Cancellation Note  Patient Details Name: [name] [name] MRN: [id number] DOB: 1945/05/05   Cancelled Treatment:    Reason Eval/Treat Not Completed: Medical issues which prohibited therapy Orders received, chart reviewed. Pt currently on BiPAP. OT will continue to follow and see as appropriate.   Argelia Formisano L. Avish Torry, OTR/L  01/29/24, 12:03 PM

## 2024-01-29 NOTE — Progress Notes (Signed)
 Patient transported on BiPAP to 1428 with no complications. Vitals stable.

## 2024-01-29 NOTE — Progress Notes (Signed)
" °   01/29/24 2343  BiPAP/CPAP/SIPAP  BiPAP/CPAP/SIPAP Pt Type Adult  BiPAP/CPAP/SIPAP SERVO  Mask Type Full face mask  Dentures removed? Not applicable  Mask Size Small  Set Rate 16 breaths/min  Respiratory Rate 31 breaths/min  IPAP 16 cmH20  EPAP 6 cmH2O  FiO2 (%) 36 %  Minute Ventilation 10.8  Leak 43  Peak Inspiratory Pressure (PIP) 16  Tidal Volume (Vt) 333  Patient Home Machine No  Patient Home Mask No  Patient Home Tubing No  Auto Titrate No  Press High Alarm 25 cmH2O  Device Plugged into RED Power Outlet Yes    "

## 2024-01-29 NOTE — Progress Notes (Signed)
 Removed PT from BiPAP at 1530 and placed on 4 LPM nasal cannula (4 LPM dep). PT is tolerating well at this time, no distress noted. RN aware. BiPAP will be placed on PRN status.

## 2024-01-30 ENCOUNTER — Encounter (HOSPITAL_COMMUNITY): Payer: Self-pay | Admitting: Internal Medicine

## 2024-01-30 DIAGNOSIS — E039 Hypothyroidism, unspecified: Secondary | ICD-10-CM | POA: Diagnosis not present

## 2024-01-30 DIAGNOSIS — R6510 Systemic inflammatory response syndrome (SIRS) of non-infectious origin without acute organ dysfunction: Secondary | ICD-10-CM | POA: Diagnosis not present

## 2024-01-30 DIAGNOSIS — J441 Chronic obstructive pulmonary disease with (acute) exacerbation: Secondary | ICD-10-CM | POA: Diagnosis not present

## 2024-01-30 DIAGNOSIS — E872 Acidosis, unspecified: Secondary | ICD-10-CM | POA: Diagnosis not present

## 2024-01-30 DIAGNOSIS — I10 Essential (primary) hypertension: Secondary | ICD-10-CM

## 2024-01-30 DIAGNOSIS — J9621 Acute and chronic respiratory failure with hypoxia: Secondary | ICD-10-CM | POA: Diagnosis not present

## 2024-01-30 DIAGNOSIS — D649 Anemia, unspecified: Secondary | ICD-10-CM | POA: Diagnosis not present

## 2024-01-30 DIAGNOSIS — N179 Acute kidney failure, unspecified: Secondary | ICD-10-CM | POA: Diagnosis not present

## 2024-01-30 DIAGNOSIS — D75839 Thrombocytosis, unspecified: Secondary | ICD-10-CM | POA: Diagnosis not present

## 2024-01-30 DIAGNOSIS — E1169 Type 2 diabetes mellitus with other specified complication: Secondary | ICD-10-CM | POA: Diagnosis not present

## 2024-01-30 DIAGNOSIS — J9622 Acute and chronic respiratory failure with hypercapnia: Secondary | ICD-10-CM | POA: Diagnosis not present

## 2024-01-30 DIAGNOSIS — R269 Unspecified abnormalities of gait and mobility: Secondary | ICD-10-CM | POA: Diagnosis not present

## 2024-01-30 LAB — CBC WITH DIFFERENTIAL/PLATELET
Abs Immature Granulocytes: 0.08 K/uL — ABNORMAL HIGH (ref 0.00–0.07)
Basophils Absolute: 0 K/uL (ref 0.0–0.1)
Basophils Relative: 0 %
Eosinophils Absolute: 0 K/uL (ref 0.0–0.5)
Eosinophils Relative: 0 %
HCT: 27.2 % — ABNORMAL LOW (ref 36.0–46.0)
Hemoglobin: 8 g/dL — ABNORMAL LOW (ref 12.0–15.0)
Immature Granulocytes: 1 %
Lymphocytes Relative: 3 %
Lymphs Abs: 0.3 K/uL — ABNORMAL LOW (ref 0.7–4.0)
MCH: 29.3 pg (ref 26.0–34.0)
MCHC: 29.4 g/dL — ABNORMAL LOW (ref 30.0–36.0)
MCV: 99.6 fL (ref 80.0–100.0)
Monocytes Absolute: 0.2 K/uL (ref 0.1–1.0)
Monocytes Relative: 1 %
Neutro Abs: 10.8 K/uL — ABNORMAL HIGH (ref 1.7–7.7)
Neutrophils Relative %: 95 %
Platelets: 307 K/uL (ref 150–400)
RBC: 2.73 MIL/uL — ABNORMAL LOW (ref 3.87–5.11)
RDW: 13.8 % (ref 11.5–15.5)
WBC: 11.4 K/uL — ABNORMAL HIGH (ref 4.0–10.5)
nRBC: 0 % (ref 0.0–0.2)

## 2024-01-30 LAB — MAGNESIUM: Magnesium: 2 mg/dL (ref 1.7–2.4)

## 2024-01-30 LAB — IRON AND TIBC
Iron: 24 ug/dL — ABNORMAL LOW (ref 28–170)
Saturation Ratios: 11 % (ref 10.4–31.8)
TIBC: 228 ug/dL — ABNORMAL LOW (ref 250–450)
UIBC: 204 ug/dL

## 2024-01-30 LAB — GLUCOSE, CAPILLARY
Glucose-Capillary: 154 mg/dL — ABNORMAL HIGH (ref 70–99)
Glucose-Capillary: 186 mg/dL — ABNORMAL HIGH (ref 70–99)
Glucose-Capillary: 172 mg/dL — ABNORMAL HIGH (ref 70–99)
Glucose-Capillary: 203 mg/dL — ABNORMAL HIGH (ref 70–99)

## 2024-01-30 LAB — BASIC METABOLIC PANEL WITH GFR
Anion gap: 4 — ABNORMAL LOW (ref 5–15)
BUN: 16 mg/dL (ref 8–23)
CO2: 35 mmol/L — ABNORMAL HIGH (ref 22–32)
Calcium: 9 mg/dL (ref 8.9–10.3)
Chloride: 105 mmol/L (ref 98–111)
Creatinine, Ser: 1.34 mg/dL — ABNORMAL HIGH (ref 0.44–1.00)
GFR, Estimated: 40 mL/min — ABNORMAL LOW
Glucose, Bld: 190 mg/dL — ABNORMAL HIGH (ref 70–99)
Potassium: 4.9 mmol/L (ref 3.5–5.1)
Sodium: 144 mmol/L (ref 135–145)

## 2024-01-30 LAB — LACTIC ACID, PLASMA: Lactic Acid, Venous: 0.6 mmol/L (ref 0.5–1.9)

## 2024-01-30 LAB — FOLATE: Folate: 11.7 ng/mL

## 2024-01-30 LAB — FERRITIN: Ferritin: 125 ng/mL (ref 11–307)

## 2024-01-30 LAB — VITAMIN B12: Vitamin B-12: 1293 pg/mL — ABNORMAL HIGH (ref 180–914)

## 2024-01-30 LAB — TSH: TSH: 2.43 u[IU]/mL (ref 0.350–4.500)

## 2024-01-30 MED ORDER — IPRATROPIUM-ALBUTEROL 0.5-2.5 (3) MG/3ML IN SOLN
3.0000 mL | Freq: Four times a day (QID) | RESPIRATORY_TRACT | Status: DC
  Administered 2024-01-31 – 2024-02-01 (×6): 3 mL via RESPIRATORY_TRACT
  Filled 2024-01-30 (×6): qty 3

## 2024-01-30 NOTE — Evaluation (Signed)
 Physical Therapy Evaluation Patient Details Name: [name] [name] MRN: [id number] DOB: [birth year] Today's Date: 01/30/2024  History of Present Illness  [name] [name] is a 79 y.o. female  presents with shortness of breath and wheezing; admitted 1/19 with acute on chronic respiratory failure with hypoxia and hypercapnia. PMH: COPD, chronic respiratory failure on 3 - 4 L, hypertension, hyperlipidemia, CAD, diabetes mellitus type 2, and hypothyroidism  Clinical Impression  Pt admitted with above diagnosis. PTA, pt reports ind with in home and community ambulation without AD, has groceries delivered to her, ind with simple meal cooking, has a cleaning lady, drives, has family locally [place] to assist if needed. On eval, pt with generalized weakness, noted to tremor with initial standing but improves with time and cues for relaxation. Pt amb 60 ft with RW, slow but functional cadence, step through gait pattern, cues to maintain body within RW frame, increased time and steps with turns. Pt amb additional 30 ft after seated rest break with RW and CGA for safety. Pt on 4L O2 and Spo2 95-100% and HR 109-125 noted on tele. Recommend HHPT at d/c. Pt currently with functional limitations due to the deficits listed below (see PT Problem List). Pt will benefit from acute skilled PT to increase their independence and safety with mobility to allow discharge.           If plan is discharge home, recommend the following: A little help with walking and/or transfers;A little help with bathing/dressing/bathroom;Assistance with cooking/housework;Assist for transportation   Can travel by private vehicle        Equipment Recommendations None recommended by PT  Recommendations for Other Services       Functional Status Assessment Patient has had a recent decline in their functional status and demonstrates the ability to make significant improvements in function in a reasonable and predictable amount of time.      Precautions / Restrictions Precautions Precautions: Fall Precaution/Restrictions Comments: 4L O2 at baseline Restrictions Weight Bearing Restrictions Per Provider Order: No      Mobility  Bed Mobility               General bed mobility comments: in recliner upon arrival    Transfers Overall transfer level: Needs assistance Equipment used: Rolling walker (2 wheels) Transfers: Sit to/from Stand Sit to Stand: Contact guard assist           General transfer comment: cues for hand placement, completes reps from recliner and low seated toilet, using handrai from toilet    Ambulation/Gait Ambulation/Gait assistance: Contact guard assist Gait Distance (Feet): 60 Feet (+39ft) Assistive device: Rolling walker (2 wheels) Gait Pattern/deviations: Step-through pattern, Decreased stride length Gait velocity: decreased but functional     General Gait Details: step through gait pattern, gait slightly decreased but functional, increased time and steps with turns, cues to maintain body within RW frame, therapist managing O2 tank as pt reports has concentrator at baseline  Stairs            Wheelchair Mobility     Tilt Bed    Modified Rankin (Stroke Patients Only)       Balance Overall balance assessment: Needs assistance         Standing balance support: During functional activity Standing balance-Leahy Scale: Fair Standing balance comment: static no UE support, dynamic with RW  Pertinent Vitals/Pain Pain Assessment Pain Assessment: No/denies pain    Home Living Family/patient expects to be discharged to:: Private residence Living Arrangements: Alone Available Help at Discharge: Family;Available PRN/intermittently Type of Home: Apartment Home Access: Level entry       Home Layout: One level Home Equipment: Educational Psychologist (4 wheels);Cane - single point      Prior Function Prior Level of Function :  Independent/Modified Independent;Driving             Mobility Comments: pt reports ind without AD for household and community ambulation ADLs Comments: pt reports ind with ADLs, groceries are delivered to her home, cooks simple meals, has a cleaning lady, drives     Extremity/Trunk Assessment   Upper Extremity Assessment Upper Extremity Assessment: Defer to OT evaluation    Lower Extremity Assessment Lower Extremity Assessment: Generalized weakness (AROM WFL, strength grossly 3+/5)    Cervical / Trunk Assessment Cervical / Trunk Assessment: Normal  Communication   Communication Communication: Impaired Factors Affecting Communication: Hearing impaired (waiting for batteries for hearing aides)    Cognition Arousal: Alert Behavior During Therapy: WFL for tasks assessed/performed   PT - Cognitive impairments: No apparent impairments                         Following commands: Intact       Cueing       General Comments General comments (skin integrity, edema, etc.): Pt on 4L O2 with Spo2 95-100% and HR 109-125 noted on tele with mobility. Educated pt on pursed lip breathing and pacing with activity.    Exercises     Assessment/Plan    PT Assessment Patient needs continued PT services  PT Problem List Decreased strength;Decreased activity tolerance;Decreased balance;Decreased mobility;Cardiopulmonary status limiting activity       PT Treatment Interventions DME instruction;Gait training;Functional mobility training;Therapeutic activities;Therapeutic exercise;Neuromuscular re-education;Patient/family education    PT Goals (Current goals can be found in the Care Plan section)  Acute Rehab PT Goals Patient Stated Goal: return home PT Goal Formulation: With patient Time For Goal Achievement: 02/13/24 Potential to Achieve Goals: Good    Frequency Min 2X/week     Co-evaluation               AM-PAC PT 6 Clicks Mobility  Outcome Measure Help  needed turning from your back to your side while in a flat bed without using bedrails?: A Little Help needed moving from lying on your back to sitting on the side of a flat bed without using bedrails?: A Little Help needed moving to and from a bed to a chair (including a wheelchair)?: A Little Help needed standing up from a chair using your arms (e.g., wheelchair or bedside chair)?: A Little Help needed to walk in hospital room?: A Little Help needed climbing 3-5 steps with a railing? : A Little 6 Click Score: 18    End of Session Equipment Utilized During Treatment: Gait belt;Oxygen  Activity Tolerance: Patient tolerated treatment well Patient left: in chair;with call bell/phone within reach;with nursing/sitter in room Nurse Communication: Mobility status PT Visit Diagnosis: Other abnormalities of gait and mobility (R26.89);Muscle weakness (generalized) (M62.81)    Time: 8967-8896 PT Time Calculation (min) (ACUTE ONLY): 31 min   Charges:   PT Evaluation $PT Eval Low Complexity: 1 Low PT Treatments $Gait Training: 8-22 mins PT General Charges $$ ACUTE PT VISIT: 1 Visit         Tori Zamoria Boss PT, DPT 01/30/24, 11:20 AM

## 2024-01-30 NOTE — Progress Notes (Signed)
" °   01/30/24 2235  BiPAP/CPAP/SIPAP  BiPAP/CPAP/SIPAP Pt Type Adult  Reason BIPAP/CPAP not in use Other(comment) (Patient refused BiPAP qhs.  Patient states that her breathing is much improved and she doesn't want to wear it tonight.)    "

## 2024-01-30 NOTE — TOC Initial Note (Signed)
 Transition of Care [facility]) - Initial/Assessment Note   Patient Details  Name: [name] [name] MRN: [id number] Date of Birth: 1945-12-11  [facility]) CM/SW Contact:    Duwaine GORMAN Aran, LCSW Phone Number: 01/30/2024, 2:06 PM  Clinical Narrative: PT evaluation recommended HHPT. CSW met with patient to discuss discharge planning. Patient confirmed she has a rollator and home oxygen  through [employer]. Patient agreeable to being faxed out for HHPT. CSW made HHPT referral in hub. Care management awaiting offers.  Expected Discharge Plan: Home w Home Health Services Barriers to Discharge: Continued Medical Work up  Patient Goals and CMS Choice Patient states their goals for this hospitalization and ongoing recovery are:: Get HHPT CMS Medicare.gov Compare Post Acute Care list provided to:: Patient Choice offered to / list presented to : Patient  Expected Discharge Plan and Services In-house Referral: Clinical Social Work Post Acute Care Choice: Home Health Living arrangements for the past 2 months: Apartment           DME Arranged: N/A DME Agency: NA  Prior Living Arrangements/Services Living arrangements for the past 2 months: Apartment Lives with:: Self Patient language and need for interpreter reviewed:: Yes Do you feel safe going back to the place where you live?: Yes      Need for Family Participation in Patient Care: No (Comment) Care giver support system in place?: Yes (comment) Current home services: DME (POC and concentrator through [employer], rollator) Criminal Activity/Legal Involvement Pertinent to Current Situation/Hospitalization: No - Comment as needed  Activities of Daily Living ADL Screening (condition at time of admission) Independently performs ADLs?: Yes (appropriate for developmental age) Is the patient deaf or have difficulty hearing?: Yes Does the patient have difficulty seeing, even when wearing glasses/contacts?: No Does the patient have difficulty  concentrating, remembering, or making decisions?: No  Permission Sought/Granted Permission sought to share information with : Other (comment) Permission granted to share information with : Yes, Verbal Permission Granted Permission granted to share info w AGENCY: HH agencies  Emotional Assessment Appearance:: Appears stated age Attitude/Demeanor/Rapport: Engaged Affect (typically observed): Accepting Orientation: : Oriented to Self, Oriented to Place, Oriented to  Time, Oriented to Situation Alcohol / Substance Use: Not Applicable Psych Involvement: No (comment)  Admission diagnosis:  Hyperlipidemia LDL goal <70 [E78.5] COPD exacerbation (HCC) [J44.1] CAD in native artery [I25.10] Essential hypertension [I10] Acute on chronic respiratory failure with hypoxia (HCC) [J96.21] Acute hypoxic respiratory failure (HCC) [J96.01] Patient Active Problem List   Diagnosis Date Noted   SIRS (systemic inflammatory response syndrome) (HCC) 01/29/2024   Lactic acidosis 01/29/2024   Acute kidney injury superimposed on chronic kidney disease 01/29/2024   Tremor 01/29/2024   Gait disturbance 01/29/2024   Thrombocytosis 01/29/2024   Normocytic anemia 12/19/2022   Hypothyroidism 12/19/2022   Pedal edema 04/25/2022   Gastritis and gastroduodenitis    RUQ pain    Dysphagia 11/04/2020   Atypical pneumonia 05/04/2020   Acute on chronic respiratory failure with hypoxia and hypercapnia (HCC) 05/04/2020   Shortness of breath 02/24/2020   History of pulmonary embolus (PE) 02/24/2020   Abnormal findings on diagnostic imaging of lung 12/11/2019   Healthcare maintenance 11/18/2019   Acute non-recurrent ethmoidal sinusitis 11/18/2019   Close exposure to COVID-19 virus 01/01/2019   Chronic respiratory failure with hypoxia (HCC) 07/30/2018   Iron deficiency anemia 01/14/2018   COPD with acute exacerbation (HCC) 11/16/2017   Pulmonary embolism (HCC) 07/26/2017   CAD in native artery 07/25/2017   Abnormal  cardiac CT angiography 02/21/2017  Chest pain with high risk of acute coronary syndrome 02/21/2017   Type 2 diabetes mellitus (HCC) 02/21/2017   GERD (gastroesophageal reflux disease) 12/05/2016   Vocal cord dysfunction 11/06/2014   Cough 06/04/2013   Thyrotoxicosis 10/31/2006   Dyslipidemia 10/31/2006   OBESITY 10/31/2006   DEPRESSION 10/31/2006   Essential hypertension 10/31/2006   Allergic rhinitis 10/31/2006   COPD (chronic obstructive pulmonary disease) (HCC) 10/31/2006   GLOMERULONEPHRITIS 10/31/2006   PCP:  Chrystal Lamarr RAMAN, MD Pharmacy:   [phone] - [place], [place] - [address] [place] [address] Phone: [phone] Fax: [phone]  Social Drivers of Health (SDOH) Social History: SDOH Screenings   Food Insecurity: No Food Insecurity (01/29/2024)  Housing: Low Risk (01/29/2024)  Transportation Needs: No Transportation Needs (01/29/2024)  Utilities: Not At Risk (01/29/2024)  Social Connections: Moderately Isolated (01/29/2024)  Tobacco Use: Medium Risk (01/30/2024)   SDOH Interventions:    Readmission Risk Interventions     No data to display

## 2024-01-30 NOTE — Progress Notes (Signed)
 SATURATION QUALIFICATIONS: (This note is used to comply with regulatory documentation for home oxygen )  Patient Saturations on Room Air at Rest = N/A patients baseline is 4L of O2 via [place].  Patient Saturations on 4 Liters of oxygen  while Ambulating = 95-100%  Please briefly explain why patient needs home oxygen :  Patient currently tolerating baseline 4L oxygen  therapy via [place] at rest and with ambulation.

## 2024-01-30 NOTE — Evaluation (Signed)
 Occupational Therapy Evaluation Patient Details Name: [name] [name] MRN: [id number] DOB: 1945/03/09 Today's Date: 01/30/2024   History of Present Illness   [name] [name] is a 79 y.o. female  presents with shortness of breath and wheezing; admitted 1/19 with acute on chronic respiratory failure with hypoxia and hypercapnia. PMH: COPD, chronic respiratory failure on 3 - 4 L, hypertension, hyperlipidemia, CAD, diabetes mellitus type 2, and hypothyroidism     Clinical Impressions Pt admitted with above. Pt lives alone and is independent with ADLs/mobility/driving with family + neighbor available to assist as needed.  Pt on 4L O2 with SpO2 95-100% during mobility HR elevated to 125 bpm, decreased back down to 100 bpm end of session. Pt completes STS transfers and mobility short distance in room using RW, grooming tasks and oral care sink level with CGA for safety. Edu on PLB and activity pacing for ADL and mobility performance with pt verbalizing understanding. Pt would benefit from skilled OT services to address noted impairments and functional limitations (see below for any additional details) in order to maximize safety and independence while minimizing falls risk and caregiver burden. Anticipate the need for follow up [facility] OT services upon acute hospital DC.      If plan is discharge home, recommend the following:   A little help with walking and/or transfers;A little help with bathing/dressing/bathroom;Help with stairs or ramp for entrance;Assist for transportation     Functional Status Assessment   Patient has had a recent decline in their functional status and demonstrates the ability to make significant improvements in function in a reasonable and predictable amount of time.     Equipment Recommendations   BSC/3in1      Precautions/Restrictions   Precautions Precautions: Fall Precaution/Restrictions Comments: 4L O2 at baseline, watch HR Restrictions Weight  Bearing Restrictions Per Provider Order: No     Mobility Bed Mobility               General bed mobility comments: in recliner upon arrival    Transfers Overall transfer level: Needs assistance Equipment used: Rolling walker (2 wheels)   Sit to Stand: Contact guard assist           General transfer comment: cues for hand placement as pt does not use AD at baseline      Balance Overall balance assessment: Needs assistance   Sitting balance-Leahy Scale: Good     Standing balance support: During functional activity Standing balance-Leahy Scale: Fair Standing balance comment: static no UE support, dynamic with RW                           ADL either performed or assessed with clinical judgement   ADL Overall ADL's : Needs assistance/impaired Eating/Feeding: Independent;Sitting   Grooming: Oral care;Standing;Contact guard assist Grooming Details (indicate cue type and reason): sink level in room CGA for safety Upper Body Bathing: Set up;Sitting   Lower Body Bathing: Contact guard assist;Sit to/from stand   Upper Body Dressing : Set up;Sitting   Lower Body Dressing: Sit to/from stand;Contact guard assist   Toilet Transfer: Contact guard assist;Ambulation;Rolling walker (2 wheels)           Functional mobility during ADLs: Contact guard assist;Rolling walker (2 wheels) General ADL Comments: HR up to 125 bpm short distance ambulation in room ~15 ft to sink SpO2 95% on baseline 4L.     Vision Baseline Vision/History: 1 Wears glasses Ability to See in Adequate Light: 0  Adequate Patient Visual Report: No change from baseline Vision Assessment?: Wears glasses for reading     Perception         Praxis         Pertinent Vitals/Pain Pain Assessment Pain Assessment: No/denies pain     Extremity/Trunk Assessment Upper Extremity Assessment Upper Extremity Assessment: Generalized weakness;Right hand dominant (grossly 4/5 MMT)   Lower  Extremity Assessment Lower Extremity Assessment: Generalized weakness   Cervical / Trunk Assessment Cervical / Trunk Assessment: Normal   Communication Communication Communication: Impaired Factors Affecting Communication: Hearing impaired   Cognition Arousal: Alert Behavior During Therapy: WFL for tasks assessed/performed Cognition: No apparent impairments                               Following commands: Intact       Cueing  General Comments   Cueing Techniques: Verbal cues  Pt on 4 O2 with SpO2 95-100% during mobility HR elevated to 125, decreased back down to 100 bpm end of session           Home Living Family/patient expects to be discharged to:: Private residence Living Arrangements: Alone Available Help at Discharge: Family;Available PRN/intermittently;Neighbor Type of Home: Apartment Home Access: Level entry     Home Layout: One level     Bathroom Shower/Tub: Producer, Television/film/video: Handicapped height     Home Equipment: Educational Psychologist (4 wheels);Cane - single point          Prior Functioning/Environment Prior Level of Function : Independent/Modified Independent;Driving             Mobility Comments: pt reports ind without AD for household and community ambulation ADLs Comments: pt reports ind with ADLs, groceries are delivered to her home, cooks simple meals, has a cleaning lady, drives    OT Problem List: Impaired balance (sitting and/or standing);Cardiopulmonary status limiting activity;Decreased knowledge of use of DME or AE;Decreased activity tolerance;Decreased strength   OT Treatment/Interventions: Self-care/ADL training;DME and/or AE instruction;Therapeutic activities;Balance training;Therapeutic exercise;Neuromuscular education;Energy conservation;Patient/family education      OT Goals(Current goals can be found in the care plan section)   Acute Rehab OT Goals OT Goal Formulation: With patient Time  For Goal Achievement: 02/13/24 Potential to Achieve Goals: Good   OT Frequency:  Min 2X/week    Co-evaluation              AM-PAC OT 6 Clicks Daily Activity     Outcome Measure Help from another person eating meals?: None Help from another person taking care of personal grooming?: A Little Help from another person toileting, which includes using toliet, bedpan, or urinal?: A Little Help from another person bathing (including washing, rinsing, drying)?: A Little Help from another person to put on and taking off regular upper body clothing?: A Little Help from another person to put on and taking off regular lower body clothing?: A Little 6 Click Score: 19   End of Session Equipment Utilized During Treatment: Rolling walker (2 wheels);Gait belt;Oxygen  Nurse Communication: Mobility status  Activity Tolerance: Patient tolerated treatment well Patient left: in chair;with chair alarm set;with call bell/phone within reach  OT Visit Diagnosis: Unsteadiness on feet (R26.81);Muscle weakness (generalized) (M62.81);Other abnormalities of gait and mobility (R26.89)                Time: 1130-1145 OT Time Calculation (min): 15 min Charges:  OT General Charges $OT Visit: 1 Visit OT Evaluation $OT Eval  Low Complexity: 1 Low  Vertis Bauder L. Jowanna Loeffler, OTR/L  01/30/24, 3:57 PM

## 2024-01-30 NOTE — Progress Notes (Signed)
 " PROGRESS NOTE  [name] [name] FMW:[id number] DOB: January 17, 1945   PCP: Chrystal Lamarr RAMAN, MD  Patient is from: Home.  Lives alone.  Has cane and walker but not using.  DOA: 01/28/2024 LOS: 2  Chief complaints Chief Complaint  Patient presents with   Shortness of Breath     Brief Narrative / Interim history: 79 year old F with PMH of COPD, chronic hypoxic RF on 3 to 4 L, CAD, CKD-3A, diabetes, tremor, gait instability, hypothyroidism and anemia presented to ED with shortness of breath and wheezing and admitted with acute on chronic respiratory failure with hypoxia and hypercapnia in the setting of COPD exacerbation, and AKI.   In ED, tachypneic and tachycardic with some work of breathing and started on BiPAP.  No documented desaturation.  VBG suggested chronic respiratory acidosis with hypercapnia.  Lactic acid 2.7.  WBC 13.7 with left shift.  Hgb 9.8.  Cr 1.64.  COVID-19, influenza and RSV PCR nonreactive.  RVP negative.  CXR without acute finding.  Cultures obtained.  Started on steroid, antibiotics and nebulizers and admitted for further care.    Subjective: Seen and examined earlier this morning.  No major events overnight or this morning.  Reports improvement in her breathing.  Still coughing but not able to cough up phlegm.  She noted worsening tremor over the last 2 weeks.  She is wondering if this is related to his Synthroid .  Denies chest pain, GI or UTI symptoms.   Assessment and plan: Chronic respiratory failure with hypoxia and hypercapnia: No documented desaturation.  VBG suggests chronic respiratory acidosis.  Patient reports using 3 to 4 L at baseline.  Acute respiratory failure ruled out - Manage COPD as below. - Minimum oxygen  to keep saturation above 88% regardless of home oxygen  level.  High risk for CO2 retention.  Acute COPD exacerbation: Unclear etiology of this.  Patient denies smoking cigarettes.  Reports using her inhalers as prescribed.  Viral panel is  negative.  CXR unrevealing. -Continue steroid, antibiotics and nebulizers. - Continue Mucinex   SIRS: With leukocytosis, tachycardia and tachypnea.  Likely due to COPD exacerbation.  No clear source of infection or infiltrate but started on ceftriaxone  and doxycycline  in the setting of COPD exacerbation.  Blood cultures NGTD.  Lactic acidosis likely due to respiratory failure.  SIRS resolving.  Lactic acidosis resolved. -COPD management as above.   AKI on CKD-3A: Cr 1.04 in 12/2022.  No recent values for comparison.  Improving. Recent Labs    01/28/24 1515 01/29/24 0214 01/30/24 0405  BUN 17 15 16   CREATININE 1.64* 1.38* 1.34*  - Continue monitoring - Avoid nephrotoxic meds  Anemia of renal disease: Hgb dropped about 2 g since admission.  No overt bleeding.  No nutritional deficiency on anemia panel. Recent Labs    02/19/23 1649 01/28/24 1515 01/29/24 0214 01/30/24 0405  HGB 10.8* 9.8* 8.3* 8.0*  -Continue monitoring  Tremor/gait instability: Reports worsening tremor.  She is concerned that if this is related to her Synthroid .  Steroid could definitely make the tremor worse. -Check TSH -PT/OT eval  NIDDM-2 with hyperglycemia: A1c 7.0%.  Hyperglycemia likely due to steroid.  She is on metformin  at home. Recent Labs  Lab 01/29/24 1116 01/29/24 1620 01/29/24 2046 01/30/24 0731 01/30/24 1113  GLUCAP 131* 74 142* 154* 186*  - Continue current insulin  regimen  Essential hypertension: Normotensive.  Thrombocytosis: Resolved.  Hypothyroidism -Check TSH -Continue home Synthroid   Mood disorder: Stable - Continue home meds.    GERD - Continue PPI.  Body mass index is 23.08 kg/m.           DVT prophylaxis:  enoxaparin  (LOVENOX ) injection 30 mg Start: 01/28/24 2200  Code Status: DNR Family Communication: None at bedside Level of care: Progressive Status is: Inpatient Remains inpatient appropriate because: COPD exacerbation   Final disposition:  Home   55 minutes with more than 50% spent in reviewing records, counseling patient/family and coordinating care.  Consultants:  None  Procedures: None   Microbiology summarized: COVID-19, influenza and RSV PCR nonreactive RVP negative Blood cultures NGTD  Objective: Vitals:   01/30/24 0458 01/30/24 1106 01/30/24 1153 01/30/24 1257  BP: 127/71 (!) 103/55  112/80  Pulse: 89 (!) 111  90  Resp: 16   17  Temp: (!) 97.2 F (36.2 C)   98.8 F (37.1 C)  TempSrc:    Oral  SpO2: 100%  99% 100%  Weight:      Height:        Examination:  GENERAL: No apparent distress.  Nontoxic. HEENT: MMM.  Vision and hearing grossly intact.  NECK: Supple.  No apparent JVD.  RESP:  No IWOB.  Frequent cough.  Wheezy. CVS:  RRR. Heart sounds normal.  ABD/GI/GU: BS+. Abd soft, NTND.  MSK/EXT:  Moves extremities. No apparent deformity. No edema.  SKIN: no apparent skin lesion or wound NEURO: AA.  Oriented appropriately.  Notable tremors in arms.  No apparent focal neuro deficit. PSYCH: Calm. Normal affect.   Sch Meds:  Scheduled Meds:  arformoterol   15 mcg Nebulization BID   atorvastatin   80 mg Oral Daily   budesonide  (PULMICORT ) nebulizer solution  0.5 mg Nebulization BID   doxycycline   100 mg Oral Q12H   enoxaparin  (LOVENOX ) injection  30 mg Subcutaneous QHS   ferrous sulfate   650 mg Oral Q breakfast   fluticasone   2 spray Each Nare Daily   guaiFENesin   1,200 mg Oral BID   insulin  aspart  0-15 Units Subcutaneous TID WC   insulin  aspart  0-5 Units Subcutaneous QHS   ipratropium-albuterol   3 mL Nebulization Q4H   levothyroxine   75 mcg Oral QAC breakfast   loratadine   10 mg Oral Daily   methylPREDNISolone  (SOLU-MEDROL ) injection  40 mg Intravenous Q12H   metoprolol  tartrate  50 mg Oral BID   pantoprazole   40 mg Oral BID   sodium chloride  flush  3 mL Intravenous Q12H   traZODone   50 mg Oral QHS   venlafaxine  XR  150 mg Oral Daily   Continuous Infusions:  cefTRIAXone  (ROCEPHIN )  IV 2  g (01/29/24 1402)   PRN Meds:.acetaminophen  **OR** acetaminophen , albuterol   Antimicrobials: Anti-infectives (From admission, onward)    Start     Dose/Rate Route Frequency Ordered Stop   01/29/24 1800  doxycycline  (VIBRA -TABS) tablet 100 mg        100 mg Oral Every 12 hours 01/29/24 1520     01/29/24 1400  cefTRIAXone  (ROCEPHIN ) 2 g in sodium chloride  0.9 % 100 mL IVPB        2 g 200 mL/hr over 30 Minutes Intravenous Every 24 hours 01/28/24 1829     01/29/24 0600  doxycycline  (VIBRAMYCIN ) 100 mg in sodium chloride  0.9 % 250 mL IVPB  Status:  Discontinued        100 mg 125 mL/hr over 120 Minutes Intravenous Every 12 hours 01/28/24 1829 01/29/24 1520   01/28/24 1600  cefTRIAXone  (ROCEPHIN ) 1 g in sodium chloride  0.9 % 100 mL IVPB        1  g 200 mL/hr over 30 Minutes Intravenous  Once 01/28/24 1557 01/28/24 1643   01/28/24 1600  doxycycline  (VIBRAMYCIN ) 100 mg in sodium chloride  0.9 % 250 mL IVPB        100 mg 125 mL/hr over 120 Minutes Intravenous  Once 01/28/24 1557 01/28/24 1814        I have personally reviewed the following labs and images: CBC: Recent Labs  Lab 01/28/24 1515 01/29/24 0214 01/30/24 0405  WBC 13.7* 11.5* 11.4*  NEUTROABS 11.4*  --  10.8*  HGB 9.8* 8.3* 8.0*  HCT 32.0* 26.9* 27.2*  MCV 96.7 96.8 99.6  PLT 471* 366 307   BMP &GFR Recent Labs  Lab 01/28/24 1515 01/29/24 0214 01/30/24 0405  NA 145 146* 144  K 4.0 4.0 4.9  CL 98 103 105  CO2 38* 34* 35*  GLUCOSE 290* 178* 190*  BUN 17 15 16   CREATININE 1.64* 1.38* 1.34*  CALCIUM  9.5 8.8* 9.0  MG  --   --  2.0   Estimated Creatinine Clearance: 24.5 mL/min (A) (by C-G formula based on SCr of 1.34 mg/dL (H)). Liver & Pancreas: Recent Labs  Lab 01/28/24 1515  AST 23  ALT 13  ALKPHOS 76  BILITOT 0.3  PROT 7.0  ALBUMIN 3.8   No results for input(s): LIPASE, AMYLASE in the last 168 hours. No results for input(s): AMMONIA in the last 168 hours. Diabetic: Recent Labs     01/29/24 0214  HGBA1C 7.0*   Recent Labs  Lab 01/29/24 1116 01/29/24 1620 01/29/24 2046 01/30/24 0731 01/30/24 1113  GLUCAP 131* 74 142* 154* 186*   Cardiac Enzymes: No results for input(s): CKTOTAL, CKMB, CKMBINDEX, TROPONINI in the last 168 hours. No results for input(s): PROBNP in the last 8760 hours. Coagulation Profile: Recent Labs  Lab 01/28/24 1515  INR 1.0   Thyroid  Function Tests: Recent Labs    01/28/24 1727 01/29/24 1058  TSH 12.800*  --   FREET4  --  0.92   Lipid Profile: No results for input(s): CHOL, HDL, LDLCALC, TRIG, CHOLHDL, LDLDIRECT in the last 72 hours. Anemia Panel: Recent Labs    01/30/24 0405  VITAMINB12 1,293*  FOLATE 11.7  FERRITIN 125  TIBC 228*  IRON 24*   Urine analysis:    Component Value Date/Time   COLORURINE STRAW (A) 01/28/2024 2001   APPEARANCEUR CLEAR 01/28/2024 2001   LABSPEC 1.006 01/28/2024 2001   PHURINE 6.0 01/28/2024 2001   GLUCOSEU NEGATIVE 01/28/2024 2001   HGBUR NEGATIVE 01/28/2024 2001   BILIRUBINUR NEGATIVE 01/28/2024 2001   KETONESUR NEGATIVE 01/28/2024 2001   PROTEINUR NEGATIVE 01/28/2024 2001   NITRITE NEGATIVE 01/28/2024 2001   LEUKOCYTESUR NEGATIVE 01/28/2024 2001   Sepsis Labs: Invalid input(s): PROCALCITONIN, LACTICIDVEN  Microbiology: Recent Results (from the past 240 hours)  Blood Culture (routine x 2)     Status: None (Preliminary result)   Collection Time: 01/28/24  3:15 PM   Specimen: BLOOD  Result Value Ref Range Status   Specimen Description   Final    BLOOD RIGHT ANTECUBITAL Performed at [facility], [address] [place], [place] [address]    Special Requests   Final    BOTTLES DRAWN AEROBIC AND ANAEROBIC Blood Culture results may not be optimal due to an inadequate volume of blood received in culture bottles Performed at [facility], [address], [place], [place] [address]    Culture   Final    NO GROWTH 2  DAYS Performed at [facility], [address], [place], [place] [address]    Report Status PENDING  Incomplete  Resp panel by RT-PCR (RSV, Flu A&B, Covid) Anterior Nasal Swab     Status: None   Collection Time: 01/28/24  3:43 PM   Specimen: Anterior Nasal Swab  Result Value Ref Range Status   SARS Coronavirus 2 by RT PCR NEGATIVE NEGATIVE Final    Comment: (NOTE) SARS-CoV-2 target nucleic acids are NOT DETECTED.  The SARS-CoV-2 RNA is generally detectable in upper respiratory specimens during the acute phase of infection. The lowest concentration of SARS-CoV-2 viral copies this assay can detect is 138 copies/mL. A negative result does not preclude SARS-Cov-2 infection and should not be used as the sole basis for treatment or other patient management decisions. A negative result may occur with  improper specimen collection/handling, submission of specimen other than nasopharyngeal swab, presence of viral mutation(s) within the areas targeted by this assay, and inadequate number of viral copies(<138 copies/mL). A negative result must be combined with clinical observations, patient history, and epidemiological information. The expected result is Negative.  Fact Sheet for Patients:  bloggercourse.com  Fact Sheet for Healthcare Providers:  seriousbroker.it  This test is no t yet approved or cleared by the [place]  FDA and  has been authorized for detection and/or diagnosis of SARS-CoV-2 by FDA under an Emergency Use Authorization (EUA). This EUA will remain  in effect (meaning this test can be used) for the duration of the COVID-19 declaration under Section 564(b)(1) of the Act, 21 U.S.C.section 360bbb-3(b)(1), unless the authorization is terminated  or revoked sooner.       Influenza A by PCR NEGATIVE NEGATIVE Final   Influenza B by PCR NEGATIVE NEGATIVE Final    Comment: (NOTE) The Xpert Xpress SARS-CoV-2/FLU/RSV  plus assay is intended as an aid in the diagnosis of influenza from Nasopharyngeal swab specimens and should not be used as a sole basis for treatment. Nasal washings and aspirates are unacceptable for Xpert Xpress SARS-CoV-2/FLU/RSV testing.  Fact Sheet for Patients: bloggercourse.com  Fact Sheet for Healthcare Providers: seriousbroker.it  This test is not yet approved or cleared by the [place]  FDA and has been authorized for detection and/or diagnosis of SARS-CoV-2 by FDA under an Emergency Use Authorization (EUA). This EUA will remain in effect (meaning this test can be used) for the duration of the COVID-19 declaration under Section 564(b)(1) of the Act, 21 U.S.C. section 360bbb-3(b)(1), unless the authorization is terminated or revoked.     Resp Syncytial Virus by PCR NEGATIVE NEGATIVE Final    Comment: (NOTE) Fact Sheet for Patients: bloggercourse.com  Fact Sheet for Healthcare Providers: seriousbroker.it  This test is not yet approved or cleared by the [place]  FDA and has been authorized for detection and/or diagnosis of SARS-CoV-2 by FDA under an Emergency Use Authorization (EUA). This EUA will remain in effect (meaning this test can be used) for the duration of the COVID-19 declaration under Section 564(b)(1) of the Act, 21 U.S.C. section 360bbb-3(b)(1), unless the authorization is terminated or revoked.  Performed at [facility], [address], [place], [place] [address]   Respiratory (~20 pathogens) panel by PCR     Status: None   Collection Time: 01/28/24  3:43 PM   Specimen: Nasopharyngeal Swab; Respiratory  Result Value Ref Range Status   Adenovirus NOT DETECTED NOT DETECTED Final   Coronavirus 229E NOT DETECTED NOT DETECTED Final    Comment: (NOTE) The Coronavirus on the Respiratory Panel, DOES  NOT test for the novel   Coronavirus (2019 nCoV)    Coronavirus HKU1 NOT DETECTED NOT DETECTED Final   Coronavirus NL63 NOT DETECTED NOT DETECTED Final   Coronavirus OC43 NOT DETECTED NOT DETECTED Final   Metapneumovirus NOT DETECTED NOT DETECTED Final   Rhinovirus / Enterovirus NOT DETECTED NOT DETECTED Final   Influenza A NOT DETECTED NOT DETECTED Final   Influenza B NOT DETECTED NOT DETECTED Final   Parainfluenza Virus 1 NOT DETECTED NOT DETECTED Final   Parainfluenza Virus 2 NOT DETECTED NOT DETECTED Final   Parainfluenza Virus 3 NOT DETECTED NOT DETECTED Final   Parainfluenza Virus 4 NOT DETECTED NOT DETECTED Final   Respiratory Syncytial Virus NOT DETECTED NOT DETECTED Final   Bordetella pertussis NOT DETECTED NOT DETECTED Final   Bordetella Parapertussis NOT DETECTED NOT DETECTED Final   Chlamydophila pneumoniae NOT DETECTED NOT DETECTED Final   Mycoplasma pneumoniae NOT DETECTED NOT DETECTED Final    Comment: Performed at [facility], [address], [place], [place] [address]  Blood Culture (routine x 2)     Status: None (Preliminary result)   Collection Time: 01/28/24  7:56 PM   Specimen: BLOOD LEFT FOREARM  Result Value Ref Range Status   Specimen Description   Final    BLOOD LEFT FOREARM Performed at [facility], [address] [place], [place] [address]    Special Requests   Final    BOTTLES DRAWN AEROBIC AND ANAEROBIC Blood Culture results may not be optimal due to an inadequate volume of blood received in culture bottles Performed at [facility], [address] [place], [place] [address]    Culture   Final    NO GROWTH 1 DAY Performed at [facility], [address], [place], [place] [address]    Report Status PENDING  Incomplete    Radiology Studies: No results found.    Aerial Dilley T. Jkayla Spiewak [facility]  If 7PM-7AM, please contact night-coverage [email or web address] 01/30/2024, 1:00 PM   "

## 2024-01-31 DIAGNOSIS — J9621 Acute and chronic respiratory failure with hypoxia: Secondary | ICD-10-CM | POA: Diagnosis not present

## 2024-01-31 DIAGNOSIS — E872 Acidosis, unspecified: Secondary | ICD-10-CM | POA: Diagnosis not present

## 2024-01-31 DIAGNOSIS — J441 Chronic obstructive pulmonary disease with (acute) exacerbation: Secondary | ICD-10-CM | POA: Diagnosis not present

## 2024-01-31 DIAGNOSIS — R6510 Systemic inflammatory response syndrome (SIRS) of non-infectious origin without acute organ dysfunction: Secondary | ICD-10-CM | POA: Diagnosis not present

## 2024-01-31 LAB — CBC
HCT: 28 % — ABNORMAL LOW (ref 36.0–46.0)
Hemoglobin: 8.1 g/dL — ABNORMAL LOW (ref 12.0–15.0)
MCH: 28.8 pg (ref 26.0–34.0)
MCHC: 28.9 g/dL — ABNORMAL LOW (ref 30.0–36.0)
MCV: 99.6 fL (ref 80.0–100.0)
Platelets: 337 K/uL (ref 150–400)
RBC: 2.81 MIL/uL — ABNORMAL LOW (ref 3.87–5.11)
RDW: 13.6 % (ref 11.5–15.5)
WBC: 9.2 K/uL (ref 4.0–10.5)
nRBC: 0 % (ref 0.0–0.2)

## 2024-01-31 LAB — RENAL FUNCTION PANEL
Albumin: 3.1 g/dL — ABNORMAL LOW (ref 3.5–5.0)
Anion gap: 5 (ref 5–15)
BUN: 24 mg/dL — ABNORMAL HIGH (ref 8–23)
CO2: 34 mmol/L — ABNORMAL HIGH (ref 22–32)
Calcium: 9.1 mg/dL (ref 8.9–10.3)
Chloride: 107 mmol/L (ref 98–111)
Creatinine, Ser: 1.33 mg/dL — ABNORMAL HIGH (ref 0.44–1.00)
GFR, Estimated: 40 mL/min — ABNORMAL LOW
Glucose, Bld: 210 mg/dL — ABNORMAL HIGH (ref 70–99)
Phosphorus: 3.9 mg/dL (ref 2.5–4.6)
Potassium: 5.2 mmol/L — ABNORMAL HIGH (ref 3.5–5.1)
Sodium: 146 mmol/L — ABNORMAL HIGH (ref 135–145)

## 2024-01-31 LAB — GLUCOSE, CAPILLARY
Glucose-Capillary: 168 mg/dL — ABNORMAL HIGH (ref 70–99)
Glucose-Capillary: 130 mg/dL — ABNORMAL HIGH (ref 70–99)
Glucose-Capillary: 177 mg/dL — ABNORMAL HIGH (ref 70–99)
Glucose-Capillary: 177 mg/dL — ABNORMAL HIGH (ref 70–99)

## 2024-01-31 LAB — MAGNESIUM: Magnesium: 2.1 mg/dL (ref 1.7–2.4)

## 2024-01-31 MED ORDER — AZITHROMYCIN 250 MG PO TABS
250.0000 mg | ORAL_TABLET | Freq: Every day | ORAL | Status: DC
  Administered 2024-01-31 – 2024-02-01 (×2): 250 mg via ORAL
  Filled 2024-01-31 (×2): qty 1

## 2024-01-31 MED ORDER — SODIUM ZIRCONIUM CYCLOSILICATE 10 G PO PACK
10.0000 g | PACK | Freq: Every day | ORAL | Status: DC
  Administered 2024-01-31: 10 g via ORAL
  Filled 2024-01-31: qty 1

## 2024-01-31 NOTE — Progress Notes (Signed)
 Mobility Specialist - Progress Note Nurse requested Mobility Specialist to perform oxygen  saturation test with pt which includes removing pt from oxygen  both at rest and while ambulating.  Below are the results from that testing.     Patient Saturations on Room Air at Rest = spO2 95%  Patient Saturations on Room Air while Ambulating = sp02 72-74% .  Patient Saturations on 2 Liters of oxygen  while Ambulating = sp02 88-92%  Patient Saturations on 3 Liters of oxygen  while Ambulating = sp02 95-97%  At end of testing pt left in room on 4  Liters of oxygen .  Reported results to nurse.      01/31/24 1057  Mobility  Activity Ambulated with assistance  Level of Assistance Contact guard assist, steadying assist  Assistive Device Front wheel walker  Distance Ambulated (ft) 140 ft  Range of Motion/Exercises Active  Activity Response Tolerated well  Mobility visit 1 Mobility  Mobility Specialist Start Time (ACUTE ONLY) 1030  Mobility Specialist Stop Time (ACUTE ONLY) 1057  Mobility Specialist Time Calculation (min) (ACUTE ONLY) 27 min   Pt was found sitting EOB and agreeable to mobilize. Grew fatigued with session. Encouraged pursed lip breathing. At EOS returned to recliner chair with all needs met. Call bell in reach and RN in room.   Erminio Leos,  Mobility Specialist Can be reached via Secure Chat

## 2024-01-31 NOTE — Progress Notes (Addendum)
 " PROGRESS NOTE  [name] [name] DOB: February 11, 1945   PCP: Chrystal Lamarr RAMAN, MD  Patient is from: Home.  Lives alone.  Has cane and walker but not using.  DOA: 01/28/2024 LOS: 3  Chief complaints Chief Complaint  Patient presents with   Shortness of Breath     Brief Narrative / Interim history: 79 year old F with PMH of COPD, chronic hypoxic RF on 3 to 4 L, CAD, CKD-3A, diabetes, tremor, gait instability, hypothyroidism and anemia presented to ED with shortness of breath and wheezing and admitted with acute on chronic respiratory failure with hypoxia and hypercapnia in the setting of COPD exacerbation, and AKI.   In ED, tachypneic and tachycardic with some work of breathing and started on BiPAP.  No documented desaturation.  VBG suggested chronic respiratory acidosis with hypercapnia.  Lactic acid 2.7.  WBC 13.7 with left shift.  Hgb 9.8.  Cr 1.64.  COVID-19, influenza and RSV PCR nonreactive.  RVP negative.  CXR without acute finding.  Cultures obtained.  Started on steroid, antibiotics and nebulizers and admitted for further care.  Slowly improving.  Subjective: Seen and examined earlier this morning.  No major events overnight or this morning.  Reports some improvement but still with chest tightness, DOE and cough.  She says she is not able to cough up phlegm.  She is anxious to go home.  Per RN, she desaturated to 70s on RA when she sat up on the edge of the bed.  Seems like she was ambulated and maintain good saturation on 2 L by [place].    Assessment and plan: Chronic respiratory failure with hypoxia and hypercapnia: No documented desaturation.  VBG suggests chronic respiratory acidosis.  Patient reports using 3 to 4 L at baseline.  Acute respiratory failure ruled out.  She seems to have maintained appropriate saturation with ambulation on 2 L. - Manage COPD as below. - Minimum oxygen  to keep saturation above 88% regardless of home oxygen  level.  High risk for CO2  retention.  Acute COPD exacerbation: Unclear etiology of this.  Patient denies smoking cigarettes.  Reports using her inhalers as prescribed.  Viral panel is negative.  CXR unrevealing.  Slowly improving but does not feel ready to go home. -Continue steroid, antibiotics, nebulizers and Lasix . -Discontinue ceftriaxone  and doxycycline .  Start azithromycin . - Encourage incentive symmetry and flutter valve - OOB, PT/OT.  SIRS: With leukocytosis, tachycardia and tachypnea.  Likely due to COPD exacerbation.  No clear source of infection or infiltrate but started on ceftriaxone  and doxycycline  in the setting of COPD exacerbation.  Blood cultures NGTD.  Lactic acidosis likely due to respiratory failure.  SIRS resolving.  Lactic acidosis resolved. -COPD management as above.   AKI on CKD-3A: Cr 1.04 in 12/2022.  No recent values for comparison.  Improving. Recent Labs    01/28/24 1515 01/29/24 0214 01/30/24 0405 01/31/24 0406  BUN 17 15 16  24*  CREATININE 1.64* 1.38* 1.34* 1.33*  - Continue monitoring - Avoid nephrotoxic meds  Anemia of renal disease: Hgb dropped about 2 g since admission.  No overt bleeding.  No nutritional deficiency on anemia panel. Recent Labs    02/19/23 1649 01/28/24 1515 01/29/24 0214 01/30/24 0405 01/31/24 0406  HGB 10.8* 9.8* 8.3* 8.0* 8.1*  -Continue monitoring  Tremor/gait instability: Reports worsening tremor.  She is concerned that if this is related to her Synthroid .  Steroid could definitely make the tremor worse.  TSH normal. -PT/OT eval  NIDDM-2 with hyperglycemia: A1c 7.0%.  Hyperglycemia likely due to steroid.  She is on metformin  at home. Recent Labs  Lab 01/30/24 1113 01/30/24 1640 01/30/24 2005 01/31/24 0736 01/31/24 1133  GLUCAP 186* 172* 203* 168* 130*  - Continue current insulin  regimen  Essential hypertension: Normotensive.  Hyperkalemia: K5.2.  Unclear etiology. - P.o. Lokelma  10 g x 1  Hypernatremia: Mild.  Dehydration? -  Recheck in the morning.  Thrombocytosis: Resolved.  Hypothyroidism: TSH normal. -Continue home Synthroid   Mood disorder: Stable - Continue home meds.  GERD - Continue PPI.    Body mass index is 23.08 kg/m.           DVT prophylaxis:  enoxaparin  (LOVENOX ) injection 30 mg Start: 01/28/24 2200  Code Status: DNR Family Communication: None at bedside Level of care: Progressive Status is: Inpatient Remains inpatient appropriate because: COPD exacerbation   Final disposition: Home   55 minutes with more than 50% spent in reviewing records, counseling patient/family and coordinating care.  Consultants:  None  Procedures: None   Microbiology summarized: COVID-19, influenza and RSV PCR nonreactive RVP negative Blood cultures NGTD  Objective: Vitals:   01/30/24 2126 01/31/24 0609 01/31/24 0853 01/31/24 0901  BP:  123/69 (!) 146/82   Pulse:  90 99   Resp:  17    Temp:  98.4 F (36.9 C)    TempSrc:  Oral    SpO2: 100% 100% 98% 91%  Weight:      Height:        Examination:  GENERAL: No apparent distress.  Nontoxic. HEENT: MMM.  Vision and hearing grossly intact.  NECK: Supple.  No apparent JVD.  RESP:  No IWOB.  Rhonchi and expiratory wheeze. CVS:  RRR. Heart sounds normal.  ABD/GI/GU: BS+. Abd soft, NTND.  MSK/EXT:  Moves extremities. No apparent deformity. No edema.  SKIN: no apparent skin lesion or wound NEURO: AA.  Oriented appropriately.  Notable tremors in arms.  No apparent focal neuro deficit. PSYCH: Calm. Normal affect.   Sch Meds:  Scheduled Meds:  arformoterol   15 mcg Nebulization BID   atorvastatin   80 mg Oral Daily   budesonide  (PULMICORT ) nebulizer solution  0.5 mg Nebulization BID   doxycycline   100 mg Oral Q12H   enoxaparin  (LOVENOX ) injection  30 mg Subcutaneous QHS   ferrous sulfate   650 mg Oral Q breakfast   fluticasone   2 spray Each Nare Daily   guaiFENesin   1,200 mg Oral BID   insulin  aspart  0-15 Units Subcutaneous TID WC    insulin  aspart  0-5 Units Subcutaneous QHS   ipratropium-albuterol   3 mL Nebulization QID   levothyroxine   75 mcg Oral QAC breakfast   loratadine   10 mg Oral Daily   methylPREDNISolone  (SOLU-MEDROL ) injection  40 mg Intravenous Q12H   metoprolol  tartrate  50 mg Oral BID   pantoprazole   40 mg Oral BID   sodium chloride  flush  3 mL Intravenous Q12H   sodium zirconium cyclosilicate   10 g Oral Daily   traZODone   50 mg Oral QHS   venlafaxine  XR  150 mg Oral Daily   Continuous Infusions:  cefTRIAXone  (ROCEPHIN )  IV 2 g (01/30/24 1512)   PRN Meds:.acetaminophen  **OR** acetaminophen , albuterol   Antimicrobials: Anti-infectives (From admission, onward)    Start     Dose/Rate Route Frequency Ordered Stop   01/29/24 1800  doxycycline  (VIBRA -TABS) tablet 100 mg        100 mg Oral Every 12 hours 01/29/24 1520     01/29/24 1400  cefTRIAXone  (ROCEPHIN ) 2 g in  sodium chloride  0.9 % 100 mL IVPB        2 g 200 mL/hr over 30 Minutes Intravenous Every 24 hours 01/28/24 1829     01/29/24 0600  doxycycline  (VIBRAMYCIN ) 100 mg in sodium chloride  0.9 % 250 mL IVPB  Status:  Discontinued        100 mg 125 mL/hr over 120 Minutes Intravenous Every 12 hours 01/28/24 1829 01/29/24 1520   01/28/24 1600  cefTRIAXone  (ROCEPHIN ) 1 g in sodium chloride  0.9 % 100 mL IVPB        1 g 200 mL/hr over 30 Minutes Intravenous  Once 01/28/24 1557 01/28/24 1643   01/28/24 1600  doxycycline  (VIBRAMYCIN ) 100 mg in sodium chloride  0.9 % 250 mL IVPB        100 mg 125 mL/hr over 120 Minutes Intravenous  Once 01/28/24 1557 01/28/24 1814        I have personally reviewed the following labs and images: CBC: Recent Labs  Lab 01/28/24 1515 01/29/24 0214 01/30/24 0405 01/31/24 0406  WBC 13.7* 11.5* 11.4* 9.2  NEUTROABS 11.4*  --  10.8*  --   HGB 9.8* 8.3* 8.0* 8.1*  HCT 32.0* 26.9* 27.2* 28.0*  MCV 96.7 96.8 99.6 99.6  PLT 471* 366 307 337   BMP &GFR Recent Labs  Lab 01/28/24 1515 01/29/24 0214 01/30/24 0405  01/31/24 0406  NA 145 146* 144 146*  K 4.0 4.0 4.9 5.2*  CL 98 103 105 107  CO2 38* 34* 35* 34*  GLUCOSE 290* 178* 190* 210*  BUN 17 15 16  24*  CREATININE 1.64* 1.38* 1.34* 1.33*  CALCIUM  9.5 8.8* 9.0 9.1  MG  --   --  2.0 2.1  PHOS  --   --   --  3.9   Estimated Creatinine Clearance: 24.6 mL/min (A) (by C-G formula based on SCr of 1.33 mg/dL (H)). Liver & Pancreas: Recent Labs  Lab 01/28/24 1515 01/31/24 0406  AST 23  --   ALT 13  --   ALKPHOS 76  --   BILITOT 0.3  --   PROT 7.0  --   ALBUMIN 3.8 3.1*   No results for input(s): LIPASE, AMYLASE in the last 168 hours. No results for input(s): AMMONIA in the last 168 hours. Diabetic: Recent Labs    01/29/24 0214  HGBA1C 7.0*   Recent Labs  Lab 01/30/24 1113 01/30/24 1640 01/30/24 2005 01/31/24 0736 01/31/24 1133  GLUCAP 186* 172* 203* 168* 130*   Cardiac Enzymes: No results for input(s): CKTOTAL, CKMB, CKMBINDEX, TROPONINI in the last 168 hours. No results for input(s): PROBNP in the last 8760 hours. Coagulation Profile: Recent Labs  Lab 01/28/24 1515  INR 1.0   Thyroid  Function Tests: Recent Labs    01/29/24 1058 01/30/24 1401  TSH  --  2.430  FREET4 0.92  --    Lipid Profile: No results for input(s): CHOL, HDL, LDLCALC, TRIG, CHOLHDL, LDLDIRECT in the last 72 hours. Anemia Panel: Recent Labs    01/30/24 0405  VITAMINB12 1,293*  FOLATE 11.7  FERRITIN 125  TIBC 228*  IRON 24*   Urine analysis:    Component Value Date/Time   COLORURINE STRAW (A) 01/28/2024 2001   APPEARANCEUR CLEAR 01/28/2024 2001   LABSPEC 1.006 01/28/2024 2001   PHURINE 6.0 01/28/2024 2001   GLUCOSEU NEGATIVE 01/28/2024 2001   HGBUR NEGATIVE 01/28/2024 2001   BILIRUBINUR NEGATIVE 01/28/2024 2001   KETONESUR NEGATIVE 01/28/2024 2001   PROTEINUR NEGATIVE 01/28/2024 2001   NITRITE NEGATIVE  01/28/2024 2001   LEUKOCYTESUR NEGATIVE 01/28/2024 2001   Sepsis Labs: Invalid input(s):  PROCALCITONIN, LACTICIDVEN  Microbiology: Recent Results (from the past 240 hours)  Blood Culture (routine x 2)     Status: None (Preliminary result)   Collection Time: 01/28/24  3:15 PM   Specimen: BLOOD  Result Value Ref Range Status   Specimen Description   Final    BLOOD RIGHT ANTECUBITAL Performed at [facility], [address] [place], [place] [address]    Special Requests   Final    BOTTLES DRAWN AEROBIC AND ANAEROBIC Blood Culture results may not be optimal due to an inadequate volume of blood received in culture bottles Performed at [facility], [address], [place], [place] [address]    Culture   Final    NO GROWTH 3 DAYS Performed at [facility], [address] [place], [place] [address]    Report Status PENDING  Incomplete  Resp panel by RT-PCR (RSV, Flu A&B, Covid) Anterior Nasal Swab     Status: None   Collection Time: 01/28/24  3:43 PM   Specimen: Anterior Nasal Swab  Result Value Ref Range Status   SARS Coronavirus 2 by RT PCR NEGATIVE NEGATIVE Final    Comment: (NOTE) SARS-CoV-2 target nucleic acids are NOT DETECTED.  The SARS-CoV-2 RNA is generally detectable in upper respiratory specimens during the acute phase of infection. The lowest concentration of SARS-CoV-2 viral copies this assay can detect is 138 copies/mL. A negative result does not preclude SARS-Cov-2 infection and should not be used as the sole basis for treatment or other patient management decisions. A negative result may occur with  improper specimen collection/handling, submission of specimen other than nasopharyngeal swab, presence of viral mutation(s) within the areas targeted by this assay, and inadequate number of viral copies(<138 copies/mL). A negative result must be combined with clinical observations, patient history, and epidemiological information. The expected result is Negative.  Fact Sheet for Patients:   bloggercourse.com  Fact Sheet for Healthcare Providers:  seriousbroker.it  This test is no t yet approved or cleared by the [place]  FDA and  has been authorized for detection and/or diagnosis of SARS-CoV-2 by FDA under an Emergency Use Authorization (EUA). This EUA will remain  in effect (meaning this test can be used) for the duration of the COVID-19 declaration under Section 564(b)(1) of the Act, 21 U.S.C.section 360bbb-3(b)(1), unless the authorization is terminated  or revoked sooner.       Influenza A by PCR NEGATIVE NEGATIVE Final   Influenza B by PCR NEGATIVE NEGATIVE Final    Comment: (NOTE) The Xpert Xpress SARS-CoV-2/FLU/RSV plus assay is intended as an aid in the diagnosis of influenza from Nasopharyngeal swab specimens and should not be used as a sole basis for treatment. Nasal washings and aspirates are unacceptable for Xpert Xpress SARS-CoV-2/FLU/RSV testing.  Fact Sheet for Patients: bloggercourse.com  Fact Sheet for Healthcare Providers: seriousbroker.it  This test is not yet approved or cleared by the [place]  FDA and has been authorized for detection and/or diagnosis of SARS-CoV-2 by FDA under an Emergency Use Authorization (EUA). This EUA will remain in effect (meaning this test can be used) for the duration of the COVID-19 declaration under Section 564(b)(1) of the Act, 21 U.S.C. section 360bbb-3(b)(1), unless the authorization is terminated or revoked.     Resp Syncytial Virus by PCR NEGATIVE NEGATIVE Final    Comment: (NOTE) Fact Sheet for Patients: bloggercourse.com  Fact Sheet for  Healthcare Providers: seriousbroker.it  This test is not yet approved or cleared by the [place]  FDA and has been authorized for detection and/or diagnosis of SARS-CoV-2 by FDA under an Emergency Use  Authorization (EUA). This EUA will remain in effect (meaning this test can be used) for the duration of the COVID-19 declaration under Section 564(b)(1) of the Act, 21 U.S.C. section 360bbb-3(b)(1), unless the authorization is terminated or revoked.  Performed at [facility], [address], [place], [place] [address]   Respiratory (~20 pathogens) panel by PCR     Status: None   Collection Time: 01/28/24  3:43 PM   Specimen: Nasopharyngeal Swab; Respiratory  Result Value Ref Range Status   Adenovirus NOT DETECTED NOT DETECTED Final   Coronavirus 229E NOT DETECTED NOT DETECTED Final    Comment: (NOTE) The Coronavirus on the Respiratory Panel, DOES NOT test for the novel  Coronavirus (2019 nCoV)    Coronavirus HKU1 NOT DETECTED NOT DETECTED Final   Coronavirus NL63 NOT DETECTED NOT DETECTED Final   Coronavirus OC43 NOT DETECTED NOT DETECTED Final   Metapneumovirus NOT DETECTED NOT DETECTED Final   Rhinovirus / Enterovirus NOT DETECTED NOT DETECTED Final   Influenza A NOT DETECTED NOT DETECTED Final   Influenza B NOT DETECTED NOT DETECTED Final   Parainfluenza Virus 1 NOT DETECTED NOT DETECTED Final   Parainfluenza Virus 2 NOT DETECTED NOT DETECTED Final   Parainfluenza Virus 3 NOT DETECTED NOT DETECTED Final   Parainfluenza Virus 4 NOT DETECTED NOT DETECTED Final   Respiratory Syncytial Virus NOT DETECTED NOT DETECTED Final   Bordetella pertussis NOT DETECTED NOT DETECTED Final   Bordetella Parapertussis NOT DETECTED NOT DETECTED Final   Chlamydophila pneumoniae NOT DETECTED NOT DETECTED Final   Mycoplasma pneumoniae NOT DETECTED NOT DETECTED Final    Comment: Performed at [facility], [address], [place], [place] [address]  Blood Culture (routine x 2)     Status: None (Preliminary result)   Collection Time: 01/28/24  7:56 PM   Specimen: BLOOD LEFT FOREARM  Result Value Ref Range Status   Specimen Description   Final    BLOOD LEFT  FOREARM Performed at [facility], [address] [place], [place] [address]    Special Requests   Final    BOTTLES DRAWN AEROBIC AND ANAEROBIC Blood Culture results may not be optimal due to an inadequate volume of blood received in culture bottles Performed at [facility], [address] [place], [place] [address]    Culture   Final    NO GROWTH 2 DAYS Performed at [facility], [address] [place], [place] [address]    Report Status PENDING  Incomplete    Radiology Studies: No results found.    Skyy Mcknight [facility]  If 7PM-7AM, please contact night-coverage [email or web address] 01/31/2024, 12:19 PM   "

## 2024-01-31 NOTE — Progress Notes (Signed)
 Mobility Specialist - Progress Note  Pre-mobility: 98% SpO2 (NC2L) During mobility: 86% SpO2 (NC2L) During mobility: 92% SpO2 (NC3L) Post-mobility: 97% SPO2 (NC2L)   01/31/24 1430  Mobility  Activity Ambulated with assistance  Level of Assistance Contact guard assist, steadying assist  Assistive Device Front wheel walker  Distance Ambulated (ft) 100 ft  Range of Motion/Exercises Active  Activity Response Tolerated fair  Mobility visit 1 Mobility  Mobility Specialist Start Time (ACUTE ONLY) 1421  Mobility Specialist Stop Time (ACUTE ONLY) 1435  Mobility Specialist Time Calculation (min) (ACUTE ONLY) 14 min   Pt was found on recliner chair and agreeable to mobilize. Spo2 decreased to 86%  on 2LNC and decreased further after seated break to 84%. Per RN increase to [facility] and able to increase >90% within 1 min. At EOS returned to bed with all needs met. Call bell in reach and RN notified of session.   Erminio Leos,  Mobility Specialist Can be reached via Secure Chat

## 2024-01-31 NOTE — TOC Progression Note (Signed)
 Transition of Care [facility]) - Progression Note   Patient Details  Name: [name] [name] MRN: [id number] Date of Birth: Apr 03, 1945  [facility]) CM/SW Contact  Duwaine GORMAN Aran, LCSW Phone Number: 01/31/2024, 12:25 PM  Clinical Narrative: CSW provided patient with the following [facility] agency offers:  [facility]  Patient chose [facility]. Hospitalist placed HHPT/OT orders. Care management to follow.  Expected Discharge Plan: Home w Home Health Services Barriers to Discharge: Continued Medical Work up  Expected Discharge Plan and Services In-house Referral: Clinical Social Work Post Acute Care Choice: Home Health Living arrangements for the past 2 months: Apartment           DME Arranged: N/A DME Agency: NA HH Arranged: PT, OT HH Agency: [facility] Date [facility] Agency Contacted: 01/30/24 Representative spoke with at [facility] Agency: Referral made in hub  Social Drivers of Health (SDOH) Interventions SDOH Screenings   Food Insecurity: No Food Insecurity (01/29/2024)  Housing: Low Risk (01/29/2024)  Transportation Needs: No Transportation Needs (01/29/2024)  Utilities: Not At Risk (01/29/2024)  Social Connections: Moderately Isolated (01/29/2024)  Tobacco Use: Medium Risk (01/30/2024)   Readmission Risk Interventions     No data to display

## 2024-01-31 NOTE — Progress Notes (Signed)
 SATURATION QUALIFICATIONS: (This note is used to comply with regulatory documentation for home oxygen )  Patient Saturations on Room Air at Rest = 95%  Patient Saturations on Room Air while Ambulating = 72-74%  Patient Saturations on 2 Liters of oxygen  while Ambulating = 88-92%  Patient Saturations on 3 Liters of oxygen  while Ambulating = 95-97%  Please briefly explain why patient needs home oxygen :  Patients baseline oxygen  therapy is 4L via [place]. Patient attempted to be weaned to RA and was unable to tolerate. Patient desatted with minimal movement/activity quickly. Patient able to be weaned to 2L O2 via [place] and ambulated on 2L for a little while before dropping to 88%. O2 increased to 3L via  and patient ambulated comfortably with sats staying >95%.

## 2024-01-31 NOTE — Progress Notes (Signed)
 Assumed patient's care from previous nurse. Agrees with assessment.

## 2024-02-01 ENCOUNTER — Encounter: Payer: Self-pay | Admitting: Hematology and Oncology

## 2024-02-01 ENCOUNTER — Other Ambulatory Visit (HOSPITAL_COMMUNITY): Payer: Self-pay

## 2024-02-01 DIAGNOSIS — R251 Tremor, unspecified: Secondary | ICD-10-CM | POA: Diagnosis not present

## 2024-02-01 DIAGNOSIS — R6510 Systemic inflammatory response syndrome (SIRS) of non-infectious origin without acute organ dysfunction: Secondary | ICD-10-CM | POA: Diagnosis not present

## 2024-02-01 DIAGNOSIS — N179 Acute kidney failure, unspecified: Secondary | ICD-10-CM | POA: Diagnosis not present

## 2024-02-01 DIAGNOSIS — E1169 Type 2 diabetes mellitus with other specified complication: Secondary | ICD-10-CM | POA: Diagnosis not present

## 2024-02-01 DIAGNOSIS — D75839 Thrombocytosis, unspecified: Secondary | ICD-10-CM | POA: Diagnosis not present

## 2024-02-01 DIAGNOSIS — I10 Essential (primary) hypertension: Secondary | ICD-10-CM | POA: Diagnosis not present

## 2024-02-01 DIAGNOSIS — K219 Gastro-esophageal reflux disease without esophagitis: Secondary | ICD-10-CM | POA: Diagnosis not present

## 2024-02-01 DIAGNOSIS — E872 Acidosis, unspecified: Secondary | ICD-10-CM | POA: Diagnosis not present

## 2024-02-01 DIAGNOSIS — E039 Hypothyroidism, unspecified: Secondary | ICD-10-CM | POA: Diagnosis not present

## 2024-02-01 DIAGNOSIS — R269 Unspecified abnormalities of gait and mobility: Secondary | ICD-10-CM | POA: Diagnosis not present

## 2024-02-01 DIAGNOSIS — J441 Chronic obstructive pulmonary disease with (acute) exacerbation: Secondary | ICD-10-CM | POA: Diagnosis not present

## 2024-02-01 DIAGNOSIS — N189 Chronic kidney disease, unspecified: Secondary | ICD-10-CM | POA: Diagnosis not present

## 2024-02-01 LAB — RENAL FUNCTION PANEL
Albumin: 3.2 g/dL — ABNORMAL LOW (ref 3.5–5.0)
Anion gap: 4 — ABNORMAL LOW (ref 5–15)
BUN: 24 mg/dL — ABNORMAL HIGH (ref 8–23)
CO2: 35 mmol/L — ABNORMAL HIGH (ref 22–32)
Calcium: 9.2 mg/dL (ref 8.9–10.3)
Chloride: 106 mmol/L (ref 98–111)
Creatinine, Ser: 1.27 mg/dL — ABNORMAL HIGH (ref 0.44–1.00)
GFR, Estimated: 43 mL/min — ABNORMAL LOW
Glucose, Bld: 194 mg/dL — ABNORMAL HIGH (ref 70–99)
Phosphorus: 4.2 mg/dL (ref 2.5–4.6)
Potassium: 5.1 mmol/L (ref 3.5–5.1)
Sodium: 145 mmol/L (ref 135–145)

## 2024-02-01 LAB — CBC
HCT: 28 % — ABNORMAL LOW (ref 36.0–46.0)
Hemoglobin: 8.2 g/dL — ABNORMAL LOW (ref 12.0–15.0)
MCH: 29.1 pg (ref 26.0–34.0)
MCHC: 29.3 g/dL — ABNORMAL LOW (ref 30.0–36.0)
MCV: 99.3 fL (ref 80.0–100.0)
Platelets: 355 K/uL (ref 150–400)
RBC: 2.82 MIL/uL — ABNORMAL LOW (ref 3.87–5.11)
RDW: 13.5 % (ref 11.5–15.5)
WBC: 8.9 K/uL (ref 4.0–10.5)
nRBC: 0 % (ref 0.0–0.2)

## 2024-02-01 LAB — MAGNESIUM: Magnesium: 2.1 mg/dL (ref 1.7–2.4)

## 2024-02-01 LAB — GLUCOSE, CAPILLARY
Glucose-Capillary: 141 mg/dL — ABNORMAL HIGH (ref 70–99)
Glucose-Capillary: 125 mg/dL — ABNORMAL HIGH (ref 70–99)

## 2024-02-01 MED ORDER — AZITHROMYCIN 250 MG PO TABS
250.0000 mg | ORAL_TABLET | Freq: Every day | ORAL | 0 refills | Status: AC
  Filled 2024-02-01: qty 3, 3d supply, fill #0

## 2024-02-01 MED ORDER — GUAIFENESIN ER 600 MG PO TB12: 600.0000 mg | ORAL_TABLET | Freq: Two times a day (BID) | ORAL | Status: AC

## 2024-02-01 MED ORDER — PREDNISONE 10 MG PO TABS
ORAL_TABLET | ORAL | 0 refills | Status: AC
  Filled 2024-02-01: qty 14, 5d supply, fill #0

## 2024-02-01 MED ORDER — SODIUM ZIRCONIUM CYCLOSILICATE 10 G PO PACK
10.0000 g | PACK | Freq: Every day | ORAL | Status: DC
  Administered 2024-02-01: 10 g via ORAL
  Filled 2024-02-01: qty 1

## 2024-02-01 NOTE — Progress Notes (Signed)
 Discharge medications delivered to patient at the bedside in a secure bag.

## 2024-02-01 NOTE — Discharge Summary (Signed)
 "  Physician Discharge Summary  [name] [name] FMW:[id number] DOB: 08/26/1945 DOA: 01/28/2024  PCP: Chrystal Lamarr RAMAN, MD  Admit date: 01/28/2024 Discharge date: 02/01/24  Admitted From: Home Disposition: Home Recommendations for Outpatient Follow-up:  Outpatient follow-up with PCP in 1 to 2 weeks Check CMP and CBC at follow-up Please follow up on the following pending results: None  Home Health: HHPT/OT Equipment/Devices: Patient has home oxygen  and cane  Discharge Condition: Stable CODE STATUS: DNR   Contact information for follow-up providers     Chrystal Lamarr RAMAN, MD. Schedule an appointment as soon as possible for a visit in 1 week(s).   Specialty: Family Medicine Contact information: [address] [place] [address] [phone]              Contact information for after-discharge care     Home Medical Care     [facility]) .   Service: Home Health Services Why: Hedda will provide PT and OT in the home after discharge. Contact information: [address] [place]  [address] [phone]                     Hospital course 79 year old F with PMH of COPD, chronic hypoxic RF on 3 to 4 L, CAD, CKD-3A, diabetes, tremor, gait instability, hypothyroidism and anemia presented to ED with shortness of breath and wheezing and admitted with acute on chronic respiratory failure with hypoxia and hypercapnia in the setting of COPD exacerbation, and AKI.    In ED, tachypneic and tachycardic with some work of breathing and started on BiPAP.  No documented desaturation.  VBG suggested chronic respiratory acidosis with hypercapnia.  Lactic acid 2.7.  WBC 13.7 with left shift.  Hgb 9.8.  Cr 1.64.  COVID-19, influenza and RSV PCR nonreactive.  RVP negative.  CXR without acute finding.  Cultures obtained.  Started on steroid, antibiotics and nebulizers and admitted for further care.   On the day of  discharge, patient's respiratory symptoms improved.  She desaturated to 86% on 2 L with ambulation but improved to 92% on 3 L.  She was on 4 L before admission.  She is discharged on p.o. prednisone  and azithromycin  for 3 more days.  She is already on Breztri  and rescue nebulizers.  Advised to use minimum oxygen  to keep saturation above 88%.  Outpatient follow-up with PCP in 1 to 2 weeks or sooner if needed.  See individual problem list below for more.   Problems addressed during this hospitalization Chronic respiratory failure with hypoxia and hypercapnia: No documented desaturation.  VBG suggests chronic respiratory acidosis.  Requiring only 3 L with ambulation. - Manage COPD as below. - Minimum oxygen  to keep saturation above 88% regardless of home oxygen  level.  High risk for CO2 retention.   Acute COPD exacerbation: Unclear etiology of this.  Patient denies smoking cigarettes.  Reports using her inhalers as prescribed.  Viral panel is negative.  CXR unrevealing.  Improved. -Discharged on p.o. prednisone  and azithromycin  for 3 more days to complete a total of 5 days course. -Continue home Breztri  and rescue nebulizers. -Mucolytics for 5 days. - Outpatient follow-up with PCP in 1 to 2 weeks or sooner if needed.   SIRS: With leukocytosis, tachycardia and tachypnea.  Likely due to COPD exacerbation.  No clear source of infection or infiltrate but started on ceftriaxone  and doxycycline  in the setting of COPD exacerbation.  Blood cultures NGTD.  Lactic acidosis likely due  to respiratory failure.  SIRS resolving.  Lactic acidosis resolved.  Sepsis ruled out. -COPD management as above.   AKI on CKD-3A: Cr 1.04 in 12/2022.  No recent values for comparison.  Improving. -Recheck in 1 week   Anemia of renal disease: Hgb dropped about 2 g since admission.  No overt bleeding.  No nutritional deficiency on anemia panel. -Recheck CBC in 1 week   Tremor/gait instability: Reports worsening tremor.  She is  concerned that if this is related to her Synthroid .  Steroid could definitely make the tremor worse.  TSH normal. - HHPT/OT ordered.   NIDDM-2 with hyperglycemia: A1c 7.0%.  Hyperglycemia likely due to steroid.  She is on metformin  at home. -Continue home medications. -Encouraged good hydration  Essential hypertension: Normotensive.   Hyperkalemia: Resolved.  Received additional p.o. Lokelma  10 g x 1 before discharge.   Hypernatremia: Mild.  Dehydration?  Resolved.   Thrombocytosis: Resolved.   Hypothyroidism: TSH normal. -Continue home Synthroid    Mood disorder: Stable - Continue home meds.   GERD - Continue PPI.    Body mass index is 23.08 kg/m.           Consultations: None  Time spent 35  minutes  Vital signs Vitals:   01/31/24 2118 01/31/24 2121 02/01/24 0508 02/01/24 0820  BP:   124/76   Pulse:   88   Temp:   98.1 F (36.7 C)   Resp:   20   Height:      Weight:      SpO2: 98% 98% 100% 96%  TempSrc:   Oral   BMI (Calculated):         Discharge exam  GENERAL: No apparent distress.  Nontoxic. HEENT: MMM.  Vision and hearing grossly intact.  NECK: Supple.  No apparent JVD.  RESP:  No IWOB.  Fair aeration bilaterally. CVS:  RRR. Heart sounds normal.  ABD/GI/GU: BS+. Abd soft, NTND.  MSK/EXT:  Moves extremities. No apparent deformity. No edema.  SKIN: no apparent skin lesion or wound NEURO: Awake and alert. Oriented appropriately.  No apparent focal neuro deficit. PSYCH: Calm. Normal affect.   Discharge Instructions Discharge Instructions     Discharge instructions   Complete by: As directed    It has been a pleasure taking care of you!  You were hospitalized due to COPD exacerbation for which you have been treated with steroid, antibiotics and breathing treatments.  Your symptoms improved.  We are discharging you more antibiotics and steroid to complete treatment course.  Continue using your inhalers.  Follow-up with your primary care doctor  and pulmonologist in 1 to 2 weeks or sooner if needed.   Please use minimum oxygen  to keep the saturation above 88%.  Too much oxygen  is dangerous with COPD!   Take care,   Increase activity slowly   Complete by: As directed       Allergies as of 02/01/2024       Reactions   Sulfonamide Derivatives Dermatitis   Lisinopril Cough        Medication List     PAUSE taking these medications    predniSONE  10 MG tablet Wait to take this until: February 06, 2024 Commonly known as: DELTASONE  Take 1 tablet (10 mg total) by mouth daily with breakfast. You also have another medication with the same name that you may need to continue taking.       TAKE these medications    albuterol  (2.5 MG/3ML) 0.083% nebulizer solution Commonly known as:  PROVENTIL  TAKE 3 ML (2.5 MG TOTAL) BY NEBULIZATION EVERY 4 HOURS AS NEEDED FOR WHEEZING OR SHORTNESS OF BREATH What changed: Another medication with the same name was changed. Make sure you understand how and when to take each.   albuterol  108 (90 Base) MCG/ACT inhaler Commonly known as: VENTOLIN  HFA Inhale 2 puffs into the lungs every 4 (four) hours as needed for wheezing or shortness of breath. What changed: when to take this   atorvastatin  80 MG tablet Commonly known as: LIPITOR TAKE 1 TABLET BY MOUTH EVERY DAY What changed: when to take this   azithromycin  250 MG tablet Commonly known as: Zithromax  Take 1 tablet (250 mg total) by mouth daily for 3 days. Start taking on: February 02, 2024   Breztri  Aerosphere 160-9-4.8 MCG/ACT Aero inhaler Generic drug: budesonide -glycopyrrolate-formoterol  Inhale 2 puffs into the lungs in the morning and at bedtime.   CALTRATE 600+D3 PO Take 2 tablets by mouth daily.   Centrum Women Tabs Take 1 tablet by mouth daily with breakfast.   esomeprazole  40 MG capsule Commonly known as: NEXIUM  TAKE 1 CAPSULE BY MOUTH 2 TIMES DAILY BEFORE A MEAL. What changed: See the new instructions.   ferrous  sulfate 325 (65 FE) MG tablet Take 325-650 mg by mouth daily with breakfast.   fluticasone  50 MCG/ACT nasal spray Commonly known as: FLONASE  Place 1 spray into both nostrils in the morning and at bedtime.   furosemide  20 MG tablet Commonly known as: LASIX  Take 1 tablet (20 mg total) by mouth as needed. Pt must schedule an overdue followup appt with Cardiology for any more refills. [phone] 3rd and FINAL attempt Thank You What changed:  when to take this reasons to take this additional instructions   guaiFENesin  600 MG 12 hr tablet Commonly known as: MUCINEX  Take 1 tablet (600 mg total) by mouth 2 (two) times daily for 5 days.   levocetirizine 5 MG tablet Commonly known as: XYZAL Take 5 mg by mouth every evening.   levothyroxine  75 MCG tablet Commonly known as: SYNTHROID  Take 75 mcg by mouth daily before breakfast.   metFORMIN  500 MG 24 hr tablet Commonly known as: GLUCOPHAGE -XR Take 500 mg by mouth daily with supper.   metoprolol  tartrate 50 MG tablet Commonly known as: LOPRESSOR  TAKE 1 TABLET BY MOUTH TWICE A DAY NEED APPT FOR REFILLS. 2ND ATTEMPT. What changed: See the new instructions.   nitroGLYCERIN  0.4 MG SL tablet Commonly known as: Nitrostat  Place 1 tablet (0.4 mg total) under the tongue every 5 (five) minutes as needed for chest pain.   OXYGEN  Inhale 4 L/min into the lungs continuous.   predniSONE  10 MG tablet Commonly known as: DELTASONE  Take 4 tablets (40 mg total) by mouth daily for 1 day, THEN 3 tablets (30 mg total) daily for 2 days, THEN 2 tablets (20 mg total) daily for 2 days. Start taking on: February 02, 2024 What changed: Another medication with the same name was paused. Ask your nurse or doctor if you should take this medication.   roflumilast  500 MCG Tabs tablet Commonly known as: DALIRESP  Take 1 tablet (500 mcg total) by mouth daily.   traZODone  50 MG tablet Commonly known as: DESYREL  Take 50 mg by mouth at bedtime.   venlafaxine  XR  150 MG 24 hr capsule Commonly known as: EFFEXOR -XR Take 150 mg by mouth daily.   vitamin C 1000 MG tablet Take 1,000 mg by mouth daily.   Vitamin D3 1000 units Caps Take 1,000 Units by mouth daily.  Procedures/Studies:   DG Chest Port 1 View Result Date: 01/28/2024 CLINICAL DATA:  Worsening shortness of breath, lethargy and weakness. EXAM: PORTABLE CHEST 1 VIEW COMPARISON:  May 01, 2023 FINDINGS: The heart size and mediastinal contours are within normal limits. There is marked severity calcification of the aortic arch. The lungs are hyperinflated. Both lungs are clear. The visualized skeletal structures are unremarkable. IMPRESSION: No active cardiopulmonary disease. Electronically Signed   By: Suzen Dials M.D.   On: 01/28/2024 16:30       The results of significant diagnostics from this hospitalization (including imaging, microbiology, ancillary and laboratory) are listed below for reference.     Microbiology: Recent Results (from the past 240 hours)  Blood Culture (routine x 2)     Status: None (Preliminary result)   Collection Time: 01/28/24  3:15 PM   Specimen: BLOOD  Result Value Ref Range Status   Specimen Description   Final    BLOOD RIGHT ANTECUBITAL Performed at [facility], [address], [place], [place] [address]    Special Requests   Final    BOTTLES DRAWN AEROBIC AND ANAEROBIC Blood Culture results may not be optimal due to an inadequate volume of blood received in culture bottles Performed at [facility], [address] [place], [place] [address]    Culture   Final    NO GROWTH 4 DAYS Performed at [facility], [address], [place], [place] [address]    Report Status PENDING  Incomplete  Resp panel by RT-PCR (RSV, Flu A&B, Covid) Anterior Nasal Swab     Status: None   Collection Time: 01/28/24  3:43 PM   Specimen: Anterior Nasal Swab  Result Value Ref Range Status   SARS Coronavirus 2  by RT PCR NEGATIVE NEGATIVE Final    Comment: (NOTE) SARS-CoV-2 target nucleic acids are NOT DETECTED.  The SARS-CoV-2 RNA is generally detectable in upper respiratory specimens during the acute phase of infection. The lowest concentration of SARS-CoV-2 viral copies this assay can detect is 138 copies/mL. A negative result does not preclude SARS-Cov-2 infection and should not be used as the sole basis for treatment or other patient management decisions. A negative result may occur with  improper specimen collection/handling, submission of specimen other than nasopharyngeal swab, presence of viral mutation(s) within the areas targeted by this assay, and inadequate number of viral copies(<138 copies/mL). A negative result must be combined with clinical observations, patient history, and epidemiological information. The expected result is Negative.  Fact Sheet for Patients:  bloggercourse.com  Fact Sheet for Healthcare Providers:  seriousbroker.it  This test is no t yet approved or cleared by the [place]  FDA and  has been authorized for detection and/or diagnosis of SARS-CoV-2 by FDA under an Emergency Use Authorization (EUA). This EUA will remain  in effect (meaning this test can be used) for the duration of the COVID-19 declaration under Section 564(b)(1) of the Act, 21 U.S.C.section 360bbb-3(b)(1), unless the authorization is terminated  or revoked sooner.       Influenza A by PCR NEGATIVE NEGATIVE Final   Influenza B by PCR NEGATIVE NEGATIVE Final    Comment: (NOTE) The Xpert Xpress SARS-CoV-2/FLU/RSV plus assay is intended as an aid in the diagnosis of influenza from Nasopharyngeal swab specimens and should not be used as a sole basis for treatment. Nasal washings and aspirates are unacceptable for Xpert Xpress SARS-CoV-2/FLU/RSV testing.  Fact Sheet for Patients: bloggercourse.com  Fact  Sheet for Healthcare  Providers: seriousbroker.it  This test is not yet approved or cleared by the [place]  FDA and has been authorized for detection and/or diagnosis of SARS-CoV-2 by FDA under an Emergency Use Authorization (EUA). This EUA will remain in effect (meaning this test can be used) for the duration of the COVID-19 declaration under Section 564(b)(1) of the Act, 21 U.S.C. section 360bbb-3(b)(1), unless the authorization is terminated or revoked.     Resp Syncytial Virus by PCR NEGATIVE NEGATIVE Final    Comment: (NOTE) Fact Sheet for Patients: bloggercourse.com  Fact Sheet for Healthcare Providers: seriousbroker.it  This test is not yet approved or cleared by the [place]  FDA and has been authorized for detection and/or diagnosis of SARS-CoV-2 by FDA under an Emergency Use Authorization (EUA). This EUA will remain in effect (meaning this test can be used) for the duration of the COVID-19 declaration under Section 564(b)(1) of the Act, 21 U.S.C. section 360bbb-3(b)(1), unless the authorization is terminated or revoked.  Performed at [facility], [address], [place], [place] [address]   Respiratory (~20 pathogens) panel by PCR     Status: None   Collection Time: 01/28/24  3:43 PM   Specimen: Nasopharyngeal Swab; Respiratory  Result Value Ref Range Status   Adenovirus NOT DETECTED NOT DETECTED Final   Coronavirus 229E NOT DETECTED NOT DETECTED Final    Comment: (NOTE) The Coronavirus on the Respiratory Panel, DOES NOT test for the novel  Coronavirus (2019 nCoV)    Coronavirus HKU1 NOT DETECTED NOT DETECTED Final   Coronavirus NL63 NOT DETECTED NOT DETECTED Final   Coronavirus OC43 NOT DETECTED NOT DETECTED Final   Metapneumovirus NOT DETECTED NOT DETECTED Final   Rhinovirus / Enterovirus NOT DETECTED NOT DETECTED Final   Influenza A NOT DETECTED NOT DETECTED  Final   Influenza B NOT DETECTED NOT DETECTED Final   Parainfluenza Virus 1 NOT DETECTED NOT DETECTED Final   Parainfluenza Virus 2 NOT DETECTED NOT DETECTED Final   Parainfluenza Virus 3 NOT DETECTED NOT DETECTED Final   Parainfluenza Virus 4 NOT DETECTED NOT DETECTED Final   Respiratory Syncytial Virus NOT DETECTED NOT DETECTED Final   Bordetella pertussis NOT DETECTED NOT DETECTED Final   Bordetella Parapertussis NOT DETECTED NOT DETECTED Final   Chlamydophila pneumoniae NOT DETECTED NOT DETECTED Final   Mycoplasma pneumoniae NOT DETECTED NOT DETECTED Final    Comment: Performed at [facility], [address] [place], [place] [address]  Blood Culture (routine x 2)     Status: None (Preliminary result)   Collection Time: 01/28/24  7:56 PM   Specimen: BLOOD LEFT FOREARM  Result Value Ref Range Status   Specimen Description   Final    BLOOD LEFT FOREARM Performed at [facility], [address] [place], [place] [address]    Special Requests   Final    BOTTLES DRAWN AEROBIC AND ANAEROBIC Blood Culture results may not be optimal due to an inadequate volume of blood received in culture bottles Performed at [facility], [address] [place], [place] [address]    Culture   Final    NO GROWTH 3 DAYS Performed at [facility], [address] [place], [place] [address]    Report Status PENDING  Incomplete     Labs:  CBC: Recent Labs  Lab 01/28/24 1515 01/29/24 0214 01/30/24 0405 01/31/24 0406 02/01/24 0403  WBC 13.7* 11.5* 11.4* 9.2 8.9  NEUTROABS 11.4*  --  10.8*  --   --  HGB 9.8* 8.3* 8.0* 8.1* 8.2*  HCT 32.0* 26.9* 27.2* 28.0* 28.0*  MCV 96.7 96.8 99.6 99.6 99.3  PLT 471* 366 307 337 355   BMP &GFR Recent Labs  Lab 01/28/24 1515 01/29/24 0214 01/30/24 0405 01/31/24 0406 02/01/24 0403  NA 145 146* 144 146* 145  K 4.0 4.0 4.9 5.2* 5.1  CL 98 103 105 107 106  CO2 38* 34* 35* 34* 35*  GLUCOSE 290* 178* 190* 210* 194*   BUN 17 15 16  24* 24*  CREATININE 1.64* 1.38* 1.34* 1.33* 1.27*  CALCIUM  9.5 8.8* 9.0 9.1 9.2  MG  --   --  2.0 2.1 2.1  PHOS  --   --   --  3.9 4.2   Estimated Creatinine Clearance: 25.8 mL/min (A) (by C-G formula based on SCr of 1.27 mg/dL (H)). Liver & Pancreas: Recent Labs  Lab 01/28/24 1515 01/31/24 0406 02/01/24 0403  AST 23  --   --   ALT 13  --   --   ALKPHOS 76  --   --   BILITOT 0.3  --   --   PROT 7.0  --   --   ALBUMIN 3.8 3.1* 3.2*   No results for input(s): LIPASE, AMYLASE in the last 168 hours. No results for input(s): AMMONIA in the last 168 hours. Diabetic: No results for input(s): HGBA1C in the last 72 hours. Recent Labs  Lab 01/31/24 1133 01/31/24 1636 01/31/24 2036 02/01/24 0721 02/01/24 1149  GLUCAP 130* 177* 177* 141* 125*   Cardiac Enzymes: No results for input(s): CKTOTAL, CKMB, CKMBINDEX, TROPONINI in the last 168 hours. No results for input(s): PROBNP in the last 8760 hours. Coagulation Profile: Recent Labs  Lab 01/28/24 1515  INR 1.0   Thyroid  Function Tests: Recent Labs    01/30/24 1401  TSH 2.430   Lipid Profile: No results for input(s): CHOL, HDL, LDLCALC, TRIG, CHOLHDL, LDLDIRECT in the last 72 hours. Anemia Panel: Recent Labs    01/30/24 0405  VITAMINB12 1,293*  FOLATE 11.7  FERRITIN 125  TIBC 228*  IRON 24*   Urine analysis:    Component Value Date/Time   COLORURINE STRAW (A) 01/28/2024 2001   APPEARANCEUR CLEAR 01/28/2024 2001   LABSPEC 1.006 01/28/2024 2001   PHURINE 6.0 01/28/2024 2001   GLUCOSEU NEGATIVE 01/28/2024 2001   HGBUR NEGATIVE 01/28/2024 2001   BILIRUBINUR NEGATIVE 01/28/2024 2001   KETONESUR NEGATIVE 01/28/2024 2001   PROTEINUR NEGATIVE 01/28/2024 2001   NITRITE NEGATIVE 01/28/2024 2001   LEUKOCYTESUR NEGATIVE 01/28/2024 2001   Sepsis Labs: Invalid input(s): PROCALCITONIN, LACTICIDVEN   SIGNED:  Leya Paige T Jakwon Gayton, MD  [facility] 02/01/2024, 12:28  PM   "

## 2024-02-01 NOTE — Progress Notes (Signed)
 Discharge instructions reviewed with patient and patient's son, verbalized understanding. All questions answered. All belongings accounted for. Patient to follow up with MD in  1-2 weeks. PIV removed. Assisted via WC to private vehicle.

## 2024-02-01 NOTE — TOC Transition Note (Signed)
 Transition of Care [facility]) - Discharge Note  Patient Details  Name: Danyia Borunda MRN: [id number] Date of Birth: [birth year]  [facility] CM/SW Contact:  Duwaine GORMAN Aran, LCSW Phone Number: 02/01/2024, 10:30 AM  Clinical Narrative: Patient will discharge home today. CSW notified Cindie with [facility] of discharge. Care management signing off.  Final next level of care: Home w Home Health Services Barriers to Discharge: Barriers Resolved  Patient Goals and CMS Choice Patient states their goals for this hospitalization and ongoing recovery are:: Get HHPT CMS Medicare.gov Compare Post Acute Care list provided to:: Patient Choice offered to / list presented to : Patient  Discharge Plan and Services Additional resources added to the After Visit Summary for   In-house Referral: Clinical Social Work Post Acute Care Choice: Home Health          DME Arranged: N/A DME Agency: NA HH Arranged: PT, OT HH Agency: [facility] Date [facility] Agency Contacted: 01/30/24 Representative spoke with at [facility] Agency: Referral made in hub  Social Drivers of Health (SDOH) Interventions SDOH Screenings   Food Insecurity: No Food Insecurity (01/29/2024)  Housing: Low Risk (01/29/2024)  Transportation Needs: No Transportation Needs (01/29/2024)  Utilities: Not At Risk (01/29/2024)  Social Connections: Moderately Isolated (01/29/2024)  Tobacco Use: Medium Risk (01/30/2024)   Readmission Risk Interventions     No data to display

## 2024-02-01 NOTE — Progress Notes (Signed)
 Physical Therapy Treatment Patient Details Name: [name] [name] MRN: [id number] DOB: 06/08/1945 Today's Date: 02/01/2024   History of Present Illness [name] [name] is a 79 y.o. female  presents with shortness of breath and wheezing; admitted 1/19 with acute on chronic respiratory failure with hypoxia and hypercapnia. PMH: COPD, chronic respiratory failure on 3 - 4 L, hypertension, hyperlipidemia, CAD, diabetes mellitus type 2, and hypothyroidism    PT Comments   Pt admitted with above diagnosis.  Pt currently with functional limitations due to the deficits listed below (see PT Problem List). Pt in bed when PT arrived. Pt agreeable to therapy intervention. Nursing reports pt to d/c home today. Pt is mod I for supine to sit, CGA for gait tasks no AD up to 120 feet with noted instability and LOB, sit to stand from EOB close S, sit to stand  from commode with close S and posterior LOB requiring mod A to recover, pt exhibited instability with clothing management. Pt strongly encouraged to use RW or at minimum a SCP for household mobility with ed provided on energy conservation, safety and stability. Pt verbalized understanding. Pt on 2 L/min as pt was found in bed throughout intervention and desaturated only s/p gait tasks of 120 feet to 87% and quickly recovered once seated in recliner and performing pursed lip breathing strategies, all needs in place and awaiting son for hospital d/c. Pt will benefit from acute skilled PT to increase their independence and safety with mobility to allow discharge.      If plan is discharge home, recommend the following: A little help with walking and/or transfers;A little help with bathing/dressing/bathroom;Assistance with cooking/housework;Assist for transportation   Can travel by private vehicle        Equipment Recommendations  None recommended by PT    Recommendations for Other Services       Precautions / Restrictions Precautions Precautions:  Fall Precaution/Restrictions Comments: 4L O2 at baseline, watch HR Restrictions Weight Bearing Restrictions Per Provider Order: No     Mobility  Bed Mobility Overal bed mobility: Modified Independent                  Transfers Overall transfer level: Needs assistance Equipment used: None Transfers: Sit to/from Stand Sit to Stand: Contact guard assist           General transfer comment: CGA for safety and pt exhibiting LOB posteriorly with sit to stand from commode, pt ed provided on use of AD in home--does not use AD at baseline furniture surfs    Ambulation/Gait Ambulation/Gait assistance: Contact guard assist Gait Distance (Feet): 120 Feet Assistive device: None Gait Pattern/deviations: Step-through pattern, Decreased stride length, Trunk flexed, Drifts right/left Gait velocity: decreased but functional     General Gait Details: step through pattern with shorter stride length, no AD per pt request due to not using AD in home setting, pt furniture surfs and noted to implement with therapy today. pt exhibited LOB x 3 laterally and pt use of objects for balance recovery.  PT provided pt ed on importance of use of AD for energy conservation, support at safety, pt verbalized understanding pt on 2 L/min as pt found today for PT tx session and desaturated follow gait tasks to 87% and quickly recovered to 94% on 2 L/min once seated in recliner with cues for pursed lip breathing   Stairs             Wheelchair Mobility     Tilt Bed  Modified Rankin (Stroke Patients Only)       Balance Overall balance assessment: Needs assistance   Sitting balance-Leahy Scale: Good     Standing balance support: During functional activity Standing balance-Leahy Scale: Fair Standing balance comment: static no UE support, dynamic without UE support but noted LOB                            Communication Communication Communication: Impaired Factors Affecting  Communication: Hearing impaired  Cognition Arousal: Alert Behavior During Therapy: WFL for tasks assessed/performed   PT - Cognitive impairments: No apparent impairments                         Following commands: Intact      Cueing Cueing Techniques: Verbal cues  Exercises      General Comments General comments (skin integrity, edema, etc.): pt on 2 L/min throughout intervetnion, on 2 L/min when PT arrived. pt indicated up to 4 L/min at baseline, pt desaturated with exertion      Pertinent Vitals/Pain Pain Assessment Pain Assessment: No/denies pain    Home Living                          Prior Function            PT Goals (current goals can now be found in the care plan section) Acute Rehab PT Goals Patient Stated Goal: return home PT Goal Formulation: With patient Time For Goal Achievement: 02/13/24 Potential to Achieve Goals: Good Progress towards PT goals: Progressing toward goals    Frequency    Min 2X/week      PT Plan      Co-evaluation              AM-PAC PT 6 Clicks Mobility   Outcome Measure  Help needed turning from your back to your side while in a flat bed without using bedrails?: A Little Help needed moving from lying on your back to sitting on the side of a flat bed without using bedrails?: A Little Help needed moving to and from a bed to a chair (including a wheelchair)?: A Little Help needed standing up from a chair using your arms (e.g., wheelchair or bedside chair)?: A Little Help needed to walk in hospital room?: A Little Help needed climbing 3-5 steps with a railing? : A Little 6 Click Score: 18    End of Session Equipment Utilized During Treatment: Gait belt;Oxygen  Activity Tolerance: Patient tolerated treatment well Patient left: in chair;with call bell/phone within reach;with chair alarm set Nurse Communication: Mobility status PT Visit Diagnosis: Other abnormalities of gait and mobility  (R26.89);Muscle weakness (generalized) (M62.81)     Time: 8845-8778 PT Time Calculation (min) (ACUTE ONLY): 27 min  Charges:    $Gait Training: 8-22 mins $Therapeutic Activity: 8-22 mins PT General Charges $$ ACUTE PT VISIT: 1 Visit                     [place], PT Acute Rehab    [place] VEAR Drone 02/01/2024, 3:07 PM

## 2024-02-01 NOTE — Care Management Important Message (Signed)
 Important Message  Patient Details IM Letter given. Name: [name] [name] MRN: [id number] Date of Birth: 06-11-1945   Important Message Given:  Yes - Medicare IM     Melba Ates 02/01/2024, 12:21 PM

## 2024-02-02 LAB — CULTURE, BLOOD (ROUTINE X 2): Culture: NO GROWTH

## 2024-02-03 LAB — CULTURE, BLOOD (ROUTINE X 2): Culture: NO GROWTH

## 2024-02-06 ENCOUNTER — Emergency Department (HOSPITAL_COMMUNITY): Admission: EM | Admit: 2024-02-06 | Discharge: 2024-02-06 | Disposition: A | Discharge status: Home / Self Care

## 2024-02-06 ENCOUNTER — Encounter (HOSPITAL_COMMUNITY): Payer: Self-pay

## 2024-02-06 ENCOUNTER — Other Ambulatory Visit: Payer: Self-pay

## 2024-02-06 DIAGNOSIS — Z760 Encounter for issue of repeat prescription: Secondary | ICD-10-CM | POA: Insufficient documentation

## 2024-02-06 DIAGNOSIS — R000 Tachycardia, unspecified: Secondary | ICD-10-CM | POA: Insufficient documentation

## 2024-02-06 MED ORDER — METOPROLOL TARTRATE 50 MG PO TABS: 50.0000 mg | ORAL_TABLET | Freq: Two times a day (BID) | ORAL | 0 refills | Status: AC

## 2024-02-06 MED ORDER — METOPROLOL TARTRATE 25 MG PO TABS
50.0000 mg | ORAL_TABLET | Freq: Once | ORAL | Status: AC
  Administered 2024-02-06: 50 mg via ORAL
  Filled 2024-02-06: qty 2

## 2024-02-06 NOTE — ED Provider Notes (Signed)
 " [facility] Provider Note   CSN: [id number] Arrival date & time: 02/06/24  1741     Patient presents with: Medication Refill   [name] [name] is a 79 y.o. female.   79 year old female presents for evaluation of medication refill.  She states she ran out of her metoprolol .  Was discharged from the hospital recently prescribed this but states that she was unable to get it filled.  States since she is not taking her heart rate has increased.  She denies any other symptoms or concerns.  Has a follow-up with her primary care doctor tomorrow morning.          Allergies: Sulfonamide derivatives and Lisinopril    Review of Systems  Constitutional:  Negative for chills and fever.  HENT:  Negative for ear pain and sore throat.   Eyes:  Negative for pain and visual disturbance.  Respiratory:  Negative for cough and shortness of breath.   Cardiovascular:  Negative for chest pain and palpitations.  Gastrointestinal:  Negative for abdominal pain and vomiting.  Genitourinary:  Negative for dysuria and hematuria.  Musculoskeletal:  Negative for arthralgias and back pain.  Skin:  Negative for color change and rash.  Neurological:  Negative for seizures and syncope.  All other systems reviewed and are negative.   Updated Vital Signs BP 125/72 (BP Location: Right Arm)   Pulse (!) 121   Temp 99.1 F (37.3 C) (Oral)   Resp 18   Ht 5' (1.524 m)   Wt 54.4 kg   SpO2 100%   BMI 23.44 kg/m   Physical Exam Vitals and nursing note reviewed.  Constitutional:      General: She is not in acute distress.    Appearance: She is well-developed.  HENT:     Head: Normocephalic and atraumatic.  Eyes:     Conjunctiva/sclera: Conjunctivae normal.  Cardiovascular:     Rate and Rhythm: Regular rhythm. Tachycardia present.     Heart sounds: No murmur heard. Pulmonary:     Effort: Pulmonary effort is normal. No respiratory distress.     Breath  sounds: Normal breath sounds.  Abdominal:     Palpations: Abdomen is soft.     Tenderness: There is no abdominal tenderness.  Musculoskeletal:        General: No swelling.     Cervical back: Neck supple.  Skin:    General: Skin is warm and dry.     Capillary Refill: Capillary refill takes less than 2 seconds.  Neurological:     Mental Status: She is alert.  Psychiatric:        Mood and Affect: Mood normal.     (all labs ordered are listed, but only abnormal results are displayed) Labs Reviewed - No data to display  EKG: EKG Interpretation Date/Time:  Wednesday February 06 2024 18:03:53 EST Ventricular Rate:  119 PR Interval:  134 QRS Duration:  98 QT Interval:  328 QTC Calculation: 461 R Axis:   150  Text Interpretation: Sinus tachycardia with Premature supraventricular complexes Incomplete right bundle branch block Right ventricular hypertrophy  Significant baseline artifact Compared with prior EKG from 01/28/2024 Confirmed by Gennaro Bouchard ([id number]) on 02/06/2024 6:06:28 PM  Radiology: No results found.   Procedures   Medications Ordered in the ED  metoprolol  tartrate (LOPRESSOR ) tablet 50 mg (50 mg Oral Given 02/06/24 1816)  Medical Decision Making Patient heart rate is only 19, vitals are otherwise stable and she is otherwise asymptomatic.  Has a follow-up tomorrow morning.  Will give her a dose of metoprolol  here and refill her metoprolol  for a few days.  Advised to stay on her other medications as prescribed.  EKG is unremarkable.  She feels comfortable plan be discharged home.  Vies return for new or worsening symptoms.  Problems Addressed: Medication refill: acute illness or injury Tachycardia: acute illness or injury  Amount and/or Complexity of Data Reviewed External Data Reviewed: notes.    Details: Prior hospital records reviewed and patient was recently discharged after being admitted at an outside  hospital ECG/medicine tests: ordered and independent interpretation performed. Decision-making details documented in ED Course.    Details: Ordered and interpreted by me in the absence of cardiology and shows sinus tachycardia, no STEMI, or significant change when compared to prior EKG  Risk OTC drugs. Prescription drug management.     Final diagnoses:  Medication refill  Tachycardia    ED Discharge Orders          Ordered    metoprolol  tartrate (LOPRESSOR ) 50 MG tablet  2 times daily        02/06/24 1807               Leyda Vanderwerf, Duwaine CROME, DO 02/06/24 2319  "

## 2024-02-06 NOTE — ED Triage Notes (Signed)
 Pt BIB [facility] from home requesting a refill for Metoprolol . Pt reports she was d/c from [facility] without the prescription & she has been tachy around 116-130 bpm without having it the last 5 days. Endorses her PCP will not refill it until she is evaluated, so she came in for eval & requesting the refill.

## 2024-02-06 NOTE — Discharge Instructions (Addendum)
 Go to your appointment with your primary care doctor tomorrow.  Take your metoprolol  as prescribed.

## 2024-02-11 ENCOUNTER — Ambulatory Visit: Admitting: Primary Care

## 2024-02-11 DIAGNOSIS — J9611 Chronic respiratory failure with hypoxia: Secondary | ICD-10-CM

## 2024-02-11 DIAGNOSIS — J449 Chronic obstructive pulmonary disease, unspecified: Secondary | ICD-10-CM

## 2024-03-04 ENCOUNTER — Ambulatory Visit: Admitting: Primary Care
